# Patient Record
Sex: Female | Born: 1965 | Race: White | Hispanic: No | Marital: Married | Smoking: Former smoker
Health system: Southern US, Community
[De-identification: ages and names within clinical notes are randomized; demographics above are authoritative.]

## PROBLEM LIST (undated history)

## (undated) DIAGNOSIS — J189 Pneumonia, unspecified organism: Secondary | ICD-10-CM

## (undated) DIAGNOSIS — A630 Anogenital (venereal) warts: Secondary | ICD-10-CM

## (undated) DIAGNOSIS — R002 Palpitations: Secondary | ICD-10-CM

## (undated) DIAGNOSIS — G4700 Insomnia, unspecified: Secondary | ICD-10-CM

## (undated) DIAGNOSIS — F419 Anxiety disorder, unspecified: Secondary | ICD-10-CM

## (undated) DIAGNOSIS — IMO0001 Reserved for inherently not codable concepts without codable children: Secondary | ICD-10-CM

## (undated) DIAGNOSIS — R9439 Abnormal result of other cardiovascular function study: Secondary | ICD-10-CM

## (undated) DIAGNOSIS — I2510 Atherosclerotic heart disease of native coronary artery without angina pectoris: Secondary | ICD-10-CM

## (undated) DIAGNOSIS — I10 Essential (primary) hypertension: Secondary | ICD-10-CM

## (undated) DIAGNOSIS — I639 Cerebral infarction, unspecified: Secondary | ICD-10-CM

## (undated) DIAGNOSIS — F319 Bipolar disorder, unspecified: Secondary | ICD-10-CM

## (undated) DIAGNOSIS — J449 Chronic obstructive pulmonary disease, unspecified: Secondary | ICD-10-CM

## (undated) DIAGNOSIS — R059 Cough, unspecified: Secondary | ICD-10-CM

## (undated) DIAGNOSIS — K635 Polyp of colon: Secondary | ICD-10-CM

## (undated) DIAGNOSIS — G5600 Carpal tunnel syndrome, unspecified upper limb: Secondary | ICD-10-CM

## (undated) DIAGNOSIS — R05 Cough: Secondary | ICD-10-CM

## (undated) DIAGNOSIS — IMO0002 Reserved for concepts with insufficient information to code with codable children: Secondary | ICD-10-CM

## (undated) HISTORY — DX: Polyp of colon: K63.5

## (undated) HISTORY — DX: Essential (primary) hypertension: I10

## (undated) HISTORY — DX: Cough: R05

## (undated) HISTORY — DX: Reserved for inherently not codable concepts without codable children: IMO0001

## (undated) HISTORY — DX: Bipolar disorder, unspecified: F31.9

## (undated) HISTORY — DX: Insomnia, unspecified: G47.00

## (undated) HISTORY — DX: Anxiety disorder, unspecified: F41.9

## (undated) HISTORY — DX: Cough, unspecified: R05.9

## (undated) HISTORY — DX: Reserved for concepts with insufficient information to code with codable children: IMO0002

## (undated) HISTORY — DX: Pneumonia, unspecified organism: J18.9

## (undated) HISTORY — DX: Anogenital (venereal) warts: A63.0

## (undated) HISTORY — DX: Carpal tunnel syndrome, unspecified upper limb: G56.00

## (undated) HISTORY — PX: WISDOM TOOTH EXTRACTION: SHX21

## (undated) HISTORY — PX: CHOLECYSTECTOMY: SHX55

## (undated) HISTORY — DX: Cerebral infarction, unspecified: I63.9

## (undated) HISTORY — DX: Abnormal result of other cardiovascular function study: R94.39

## (undated) HISTORY — PX: GANGLION CYST EXCISION: SHX1691

## (undated) HISTORY — PX: OTHER SURGICAL HISTORY: SHX169

## (undated) HISTORY — DX: Palpitations: R00.2

---

## 2003-12-09 DIAGNOSIS — I10 Essential (primary) hypertension: Secondary | ICD-10-CM | POA: Insufficient documentation

## 2005-02-07 ENCOUNTER — Emergency Department: Payer: Self-pay | Admitting: Internal Medicine

## 2005-05-04 ENCOUNTER — Emergency Department: Payer: Self-pay | Admitting: Emergency Medicine

## 2005-09-12 ENCOUNTER — Ambulatory Visit: Payer: Self-pay

## 2006-02-13 ENCOUNTER — Emergency Department: Payer: Self-pay | Admitting: Emergency Medicine

## 2006-11-18 ENCOUNTER — Other Ambulatory Visit: Payer: Self-pay

## 2006-11-18 ENCOUNTER — Emergency Department: Payer: Self-pay | Admitting: Emergency Medicine

## 2007-08-03 ENCOUNTER — Ambulatory Visit: Payer: Self-pay

## 2007-08-07 ENCOUNTER — Ambulatory Visit: Payer: Self-pay

## 2007-08-12 ENCOUNTER — Ambulatory Visit: Payer: Self-pay | Admitting: Gastroenterology

## 2010-09-28 ENCOUNTER — Encounter: Payer: Self-pay | Admitting: Cardiovascular Disease

## 2010-10-03 ENCOUNTER — Other Ambulatory Visit: Payer: Self-pay | Admitting: Family

## 2010-10-15 ENCOUNTER — Encounter: Payer: Self-pay | Admitting: Cardiovascular Disease

## 2010-10-18 ENCOUNTER — Encounter: Payer: Self-pay | Admitting: Cardiovascular Disease

## 2010-10-18 ENCOUNTER — Ambulatory Visit: Payer: Self-pay | Admitting: Internal Medicine

## 2012-01-03 ENCOUNTER — Encounter: Payer: Self-pay | Admitting: Cardiovascular Disease

## 2012-01-14 ENCOUNTER — Encounter: Payer: Self-pay | Admitting: *Deleted

## 2012-01-14 ENCOUNTER — Encounter: Payer: Self-pay | Admitting: Cardiovascular Disease

## 2012-01-14 ENCOUNTER — Ambulatory Visit (INDEPENDENT_AMBULATORY_CARE_PROVIDER_SITE_OTHER): Payer: Medicare HMO | Admitting: Cardiovascular Disease

## 2012-01-14 VITALS — BP 150/90 | HR 72 | Ht 61.0 in | Wt 184.8 lb

## 2012-01-14 DIAGNOSIS — Z720 Tobacco use: Secondary | ICD-10-CM | POA: Insufficient documentation

## 2012-01-14 DIAGNOSIS — I10 Essential (primary) hypertension: Secondary | ICD-10-CM | POA: Insufficient documentation

## 2012-01-14 DIAGNOSIS — I498 Other specified cardiac arrhythmias: Secondary | ICD-10-CM | POA: Insufficient documentation

## 2012-01-14 DIAGNOSIS — R9431 Abnormal electrocardiogram [ECG] [EKG]: Secondary | ICD-10-CM | POA: Insufficient documentation

## 2012-01-14 DIAGNOSIS — F17200 Nicotine dependence, unspecified, uncomplicated: Secondary | ICD-10-CM

## 2012-01-14 MED ORDER — METOPROLOL TARTRATE 25 MG PO TABS: 25.0000 mg | ORAL_TABLET | Freq: Two times a day (BID) | ORAL | Status: DC

## 2012-01-14 NOTE — Assessment & Plan Note (Signed)
Suboptimal control. Increase metoprolol 25 mg twice daily.

## 2012-01-14 NOTE — Patient Instructions (Signed)
Your physician has recommended you make the following change in your medication: INCREASE Metoprolol Tartrate to 25mg  take one by mouth twice a day  Your physician recommends that you have lab work with your PCP: TSH, BMP and Magnesium (order given to patient)  Your physician wants you to follow-up in: 6 MONTHS.  You will receive a reminder letter in the mail two months in advance. If you don't receive a letter, please call our office to schedule the follow-up appointment.

## 2012-01-14 NOTE — Progress Notes (Signed)
HPI:  46 year old woman referred for further evaluation of palpitations and abnormal EKG. She underwent an EKG last week as part of her physical exam and this demonstrated ventricular bigeminy and marked ST/T-wave abnormality. She has had previous evaluation by Dr. Gwen Pounds that included cardiac catheterization last year. By the patient's report, her cardiac cath was normal. I do not have any specific records from the catheterization were her cardiology office visits.  The patient complains of palpitations and localized chest/epigastric discomfort. There is no exertional component present. She denies edema, orthopnea, or PND. She does admit to chronic exertional dyspnea. She continues to smoke up to 2 packs of cigarettes daily.  The patient feels her palpitations and the backs of her eyes. She does not have pulsatile tinnitus.  She isn't physically active, but she walks her dog a little bit and has no symptoms of that level of exertion.  Outpatient Encounter Prescriptions as of 01/14/2012  Medication Sig Dispense Refill  . ALPRAZolam (XANAX) 1 MG tablet Take 1 mg by mouth as needed.      [name] amphetamine-dextroamphetamine (ADDERALL XR) 25 MG 24 hr capsule Take 25 mg by mouth daily. 3 tablets in the am      . aspirin-sod bicarb-citric acid (ALKA-SELTZER) 325 MG TBEF Take 325 mg by mouth every 6 (six) hours as needed.      . calcium carbonate (TUMS - DOSED IN MG ELEMENTAL CALCIUM) 500 MG chewable tablet Chew 1 tablet by mouth daily.      . CHLORPHENIRAMINE-HYDROCODONE PO Take by mouth as needed.      . cyclobenzaprine (FLEXERIL) 5 MG tablet Take 5 mg by mouth as needed.      . DULoxetine (CYMBALTA) 60 MG capsule Take 60 mg by mouth daily.      . fexofenadine (ALLEGRA) 180 MG tablet Take 180 mg by mouth daily.      . fluticasone (FLONASE) 50 MCG/ACT nasal spray Place 2 sprays into the nose daily.      . Fluticasone-Salmeterol (ADVAIR) 250-50 MCG/DOSE AEPB Inhale 1 puff into the lungs every 12 (twelve)  hours.      [name] HYDROcodone-homatropine (HYCODAN) 5-1.5 MG/5ML syrup Take 5 mLs by mouth as needed.      [name] ibuprofen (ADVIL,MOTRIN) 200 MG tablet Take 200 mg by mouth daily.      . medroxyPROGESTERone (DEPO-PROVERA) 150 MG/ML injection Inject 150 mg into the muscle every 3 (three) months.      . Menthol-Methyl Salicylate (MUSCLE RUB) 10-15 % CREA Apply 1 application topically as needed.      . metoprolol tartrate (LOPRESSOR) 25 MG tablet Take 1 tablet (25 mg total) by mouth 2 (two) times daily.  60 tablet  11  . omeprazole (PRILOSEC) 20 MG capsule Take 20 mg by mouth daily.      . traMADol (ULTRAM) 50 MG tablet Take 50 mg by mouth as needed.      . zaleplon (SONATA) 10 MG capsule Take 10 mg by mouth as needed.      [name] DISCONTD: metoprolol tartrate (LOPRESSOR) 25 MG tablet Take 25 mg by mouth 2 (two) times daily. 12.5 twice daily        Effexor and Isosorbide  Past Medical History  Diagnosis Date  . Chest pain   . Hyperplastic colon polyp   . Allergic rhinitis   . Palpitations   . HPV (human papilloma virus) anogenital infection   . Contraception   . Cough   . Systolic dysfunction   . Abnormal stress  test   . Carpal tunnel syndrome   . Chronic anxiety   . Insomnia   . Pneumonia   . Bipolar disorder   . HTN (hypertension)     No past surgical history on file.  History   Social History  . Marital Status: Unknown    Spouse Name: N/A    Number of Children: N/A  . Years of Education: N/A   Occupational History  . Not on file.   Social History Main Topics  . Smoking status: Current Everyday Smoker -- 1.0 packs/day for 26 years    Types: Cigarettes  . Smokeless tobacco: Not on file  . Alcohol Use: No     Had a history of alcohol use but currently is not drinking any  . Drug Use: No  . Sexually Active: Not on file   Other Topics Concern  . Not on file   Social History Narrative  . No narrative on file    Family history: The patient's father had four-vessel bypass in  his 4s, her mother had a postoperative MI.  ROS: General: no fevers/chills/night sweats Eyes: no blurry vision, diplopia, or amaurosis ENT: no sore throat or hearing loss Resp: Positive for cough, wheezing, and shortness of breath. Negative for hemoptysis CV: no edema. Otherwise see history of present illness GI: no abdominal pain, nausea, vomiting, diarrhea, or constipation. Positive for acid reflux symptoms. GU: no dysuria, frequency, or hematuria Skin: no rash Neuro: no headache, numbness, tingling, or weakness of extremities Musculoskeletal: Positive for bilateral forearm and hand pain related to carpal tunnel syndrome Heme: no bleeding, DVT, or easy bruising Endo: no polydipsia or polyuria  BP 150/90  Pulse 72  Ht 5\' 1"  (1.549 m)  Wt 83.825 kg (184 lb 12.8 oz)  BMI 34.92 kg/m2  PHYSICAL EXAM: Pt is alert and oriented, pleasant overweight woman in no distress. HEENT: normal Neck: JVP normal. Carotid upstrokes normal without bruits. No thyromegaly. Lungs: equal expansion, clear bilaterally CV: Apex is discrete and nondisplaced, RRR without murmur or gallop Abd: soft, NT, +BS, no bruit, no hepatosplenomegaly Back: no CVA tenderness Ext: no C/C/E        Femoral pulses 2+= without bruits        DP/PT pulses intact and = Skin: warm and dry without rash Neuro: CNII-XII intact             Strength intact = bilaterally  EKG:  Sinus rhythm with frequent PVCs in a pattern of ventricular bigeminy. Marked ST and T wave abnormality consider inferolateral ischemia.  ASSESSMENT AND PLAN:

## 2012-01-14 NOTE — Assessment & Plan Note (Signed)
Tobacco cessation counseling was done. She does not seem inclined to quit.

## 2012-01-14 NOTE — Assessment & Plan Note (Signed)
The patient's 12-lead EKG shows bigeminy. There is no clear history of structural heart disease. We need to review the records from [place]. It appears she has had a cardiac catheterization I suspect she's also had an echocardiogram. We will request the records from Dr. Philemon Kingdom office and from the cardiac cath lab at [facility]. I'm also going to request that her electrolytes be drawn with her upcoming lab work at her primary care physician's office. This should include magnesium and potassium. I will plan on seeing her back in followup in 6 months. I've asked her to increase her metoprolol to 25 mg twice daily.

## 2012-01-14 NOTE — Assessment & Plan Note (Signed)
The patient's EKG is markedly abnormal. Again, will review records before ordering any further studies. She will need an echocardiogram if this has not been done.

## 2012-04-28 DIAGNOSIS — R87619 Unspecified abnormal cytological findings in specimens from cervix uteri: Secondary | ICD-10-CM

## 2012-04-28 DIAGNOSIS — IMO0002 Reserved for concepts with insufficient information to code with codable children: Secondary | ICD-10-CM

## 2012-04-28 HISTORY — DX: Unspecified abnormal cytological findings in specimens from cervix uteri: R87.619

## 2012-04-28 HISTORY — DX: Reserved for concepts with insufficient information to code with codable children: IMO0002

## 2012-05-06 ENCOUNTER — Ambulatory Visit: Payer: Self-pay | Admitting: Family Medicine

## 2012-05-06 LAB — CBC WITH DIFFERENTIAL/PLATELET
Basophil #: 0.2 10*3/uL — ABNORMAL HIGH (ref 0.0–0.1)
Eosinophil #: 0.1 10*3/uL (ref 0.0–0.7)
HGB: 14.5 g/dL (ref 12.0–16.0)
Lymphocyte #: 1.9 10*3/uL (ref 1.0–3.6)
MCH: 30.8 pg (ref 26.0–34.0)
MCHC: 34.8 g/dL (ref 32.0–36.0)
MCV: 89 fL (ref 80–100)
Monocyte #: 0.7 x10 3/mm (ref 0.2–0.9)
Neutrophil #: 13.1 10*3/uL — ABNORMAL HIGH (ref 1.4–6.5)
Platelet: 171 10*3/uL (ref 150–440)
RBC: 4.7 10*6/uL (ref 3.80–5.20)

## 2012-05-06 LAB — COMPREHENSIVE METABOLIC PANEL
Albumin: 3.7 g/dL (ref 3.4–5.0)
Anion Gap: 9 (ref 7–16)
BUN: 16 mg/dL (ref 7–18)
Glucose: 105 mg/dL — ABNORMAL HIGH (ref 65–99)
Osmolality: 285 (ref 275–301)
Potassium: 3.6 mmol/L (ref 3.5–5.1)
Sodium: 142 mmol/L (ref 136–145)
Total Protein: 7.5 g/dL (ref 6.4–8.2)

## 2012-05-06 LAB — LIPID PANEL
Cholesterol: 179 mg/dL (ref 0–200)
HDL Cholesterol: 41 mg/dL (ref 40–60)
VLDL Cholesterol, Calc: 16 mg/dL (ref 5–40)

## 2012-05-06 LAB — TSH: Thyroid Stimulating Horm: 2.27 u[IU]/mL

## 2012-05-21 ENCOUNTER — Ambulatory Visit (INDEPENDENT_AMBULATORY_CARE_PROVIDER_SITE_OTHER): Payer: Medicare HMO | Admitting: Obstetrics and Gynecology

## 2012-05-21 ENCOUNTER — Encounter: Payer: Self-pay | Admitting: Obstetrics and Gynecology

## 2012-05-21 VITALS — BP 118/74 | Wt 180.0 lb

## 2012-05-21 DIAGNOSIS — B977 Papillomavirus as the cause of diseases classified elsewhere: Secondary | ICD-10-CM

## 2012-05-21 MED ORDER — FOLIC ACID 1 MG PO TABS: 1.0000 mg | ORAL_TABLET | Freq: Every day | ORAL | Status: AC

## 2012-05-21 NOTE — Progress Notes (Signed)
Subjective:  Discuss options   Susan Gilmore is a 46 y.o. female, G1P1001, who was referred by [facility]  Liane Comber FNP) because of pap done 04/29/2011 with + HPV  But no abnormal cells. Patient reports that had a previous abnormal Pap approximately 2 years ago for which she underwent a colposcopy. No biopsies were collected. Since then, has never had a normal Pap smear and admits to not keeping all of her follow-ups.   The following portions of the patient's history were reviewed and updated as appropriate: allergies, current medications, past family history.  Review of Systems Pertinent items are noted in HPI.    Objective:    BP 118/74  Wt 180 lb (81.647 kg)    Weight:  Wt Readings from Last 1 Encounters:  05/21/12 180 lb (81.647 kg)          BMI: There is no height on file to calculate BMI.  General Appearance: Alert, appropriate appearance for age. No acute distress    Assessment:    HPV + on a normal Pap smear    Plan:    40 minutes conversation with patient and her husband on:  1. HPV and its natural course. STD implications. 90% probability of spontaneous resolution with long term immunity to this particular subtype. Dysplasia risk associated with HPV 2. Most likely with previous history, this is a resolution of previous abnormal Pap smear 3. Recommend Pap every 6 months until 3 consecutive normal then may resume annually. Patient will return to her provider for these. 4. Prescribed Folic Acid 1 mg daily to shorten interval for resolution.  Will remain available PRN Thank you for your referral  Silverio Lay MD

## 2012-05-28 ENCOUNTER — Encounter: Payer: Self-pay | Admitting: Obstetrics and Gynecology

## 2012-12-30 DIAGNOSIS — R6889 Other general symptoms and signs: Secondary | ICD-10-CM | POA: Diagnosis not present

## 2012-12-30 DIAGNOSIS — Z3009 Encounter for other general counseling and advice on contraception: Secondary | ICD-10-CM | POA: Diagnosis not present

## 2012-12-30 DIAGNOSIS — I10 Essential (primary) hypertension: Secondary | ICD-10-CM | POA: Diagnosis not present

## 2012-12-30 DIAGNOSIS — R002 Palpitations: Secondary | ICD-10-CM | POA: Diagnosis not present

## 2013-01-01 DIAGNOSIS — R0602 Shortness of breath: Secondary | ICD-10-CM | POA: Diagnosis not present

## 2013-01-01 DIAGNOSIS — R002 Palpitations: Secondary | ICD-10-CM | POA: Diagnosis not present

## 2013-01-01 DIAGNOSIS — I4949 Other premature depolarization: Secondary | ICD-10-CM | POA: Diagnosis not present

## 2013-01-01 DIAGNOSIS — Z3009 Encounter for other general counseling and advice on contraception: Secondary | ICD-10-CM | POA: Diagnosis not present

## 2013-02-25 ENCOUNTER — Ambulatory Visit: Payer: Self-pay | Admitting: Family Medicine

## 2013-03-15 DIAGNOSIS — G5600 Carpal tunnel syndrome, unspecified upper limb: Secondary | ICD-10-CM | POA: Diagnosis not present

## 2013-03-15 DIAGNOSIS — J40 Bronchitis, not specified as acute or chronic: Secondary | ICD-10-CM | POA: Diagnosis not present

## 2013-03-15 DIAGNOSIS — J309 Allergic rhinitis, unspecified: Secondary | ICD-10-CM | POA: Diagnosis not present

## 2013-03-15 DIAGNOSIS — M7989 Other specified soft tissue disorders: Secondary | ICD-10-CM | POA: Diagnosis not present

## 2013-03-19 DIAGNOSIS — Z309 Encounter for contraceptive management, unspecified: Secondary | ICD-10-CM | POA: Diagnosis not present

## 2013-03-19 DIAGNOSIS — J309 Allergic rhinitis, unspecified: Secondary | ICD-10-CM | POA: Diagnosis not present

## 2013-03-19 DIAGNOSIS — J40 Bronchitis, not specified as acute or chronic: Secondary | ICD-10-CM | POA: Diagnosis not present

## 2013-03-19 DIAGNOSIS — M7989 Other specified soft tissue disorders: Secondary | ICD-10-CM | POA: Diagnosis not present

## 2013-04-21 DIAGNOSIS — G5600 Carpal tunnel syndrome, unspecified upper limb: Secondary | ICD-10-CM | POA: Diagnosis not present

## 2013-04-21 DIAGNOSIS — R51 Headache: Secondary | ICD-10-CM | POA: Diagnosis not present

## 2013-04-21 DIAGNOSIS — M7989 Other specified soft tissue disorders: Secondary | ICD-10-CM | POA: Diagnosis not present

## 2013-04-21 DIAGNOSIS — I10 Essential (primary) hypertension: Secondary | ICD-10-CM | POA: Diagnosis not present

## 2013-05-20 DIAGNOSIS — G5600 Carpal tunnel syndrome, unspecified upper limb: Secondary | ICD-10-CM | POA: Diagnosis not present

## 2013-05-20 DIAGNOSIS — R51 Headache: Secondary | ICD-10-CM | POA: Diagnosis not present

## 2013-05-20 DIAGNOSIS — M7989 Other specified soft tissue disorders: Secondary | ICD-10-CM | POA: Diagnosis not present

## 2013-05-20 DIAGNOSIS — R05 Cough: Secondary | ICD-10-CM | POA: Diagnosis not present

## 2013-06-11 DIAGNOSIS — G5600 Carpal tunnel syndrome, unspecified upper limb: Secondary | ICD-10-CM | POA: Diagnosis not present

## 2013-06-11 DIAGNOSIS — R05 Cough: Secondary | ICD-10-CM | POA: Diagnosis not present

## 2013-06-11 DIAGNOSIS — R51 Headache: Secondary | ICD-10-CM | POA: Diagnosis not present

## 2013-06-11 DIAGNOSIS — M7989 Other specified soft tissue disorders: Secondary | ICD-10-CM | POA: Diagnosis not present

## 2013-06-11 DIAGNOSIS — Z309 Encounter for contraceptive management, unspecified: Secondary | ICD-10-CM | POA: Diagnosis not present

## 2013-06-11 DIAGNOSIS — Z23 Encounter for immunization: Secondary | ICD-10-CM | POA: Diagnosis not present

## 2013-06-15 DIAGNOSIS — Z309 Encounter for contraceptive management, unspecified: Secondary | ICD-10-CM | POA: Diagnosis not present

## 2013-06-15 DIAGNOSIS — R51 Headache: Secondary | ICD-10-CM | POA: Diagnosis not present

## 2013-06-15 DIAGNOSIS — F988 Other specified behavioral and emotional disorders with onset usually occurring in childhood and adolescence: Secondary | ICD-10-CM | POA: Diagnosis not present

## 2013-06-15 DIAGNOSIS — M7989 Other specified soft tissue disorders: Secondary | ICD-10-CM | POA: Diagnosis not present

## 2013-06-15 DIAGNOSIS — Z23 Encounter for immunization: Secondary | ICD-10-CM | POA: Diagnosis not present

## 2013-06-15 DIAGNOSIS — R05 Cough: Secondary | ICD-10-CM | POA: Diagnosis not present

## 2013-09-09 LAB — HM PAP SMEAR: HM Pap smear: POSITIVE

## 2013-11-03 ENCOUNTER — Other Ambulatory Visit: Payer: Self-pay

## 2013-11-03 LAB — COMPREHENSIVE METABOLIC PANEL
ALBUMIN: 3.8 g/dL (ref 3.4–5.0)
ALK PHOS: 102 U/L
ALT: 36 U/L (ref 12–78)
AST: 22 U/L (ref 15–37)
Anion Gap: 4 — ABNORMAL LOW (ref 7–16)
BUN: 10 mg/dL (ref 7–18)
Bilirubin,Total: 0.6 mg/dL (ref 0.2–1.0)
CO2: 26 mmol/L (ref 21–32)
Calcium, Total: 8.9 mg/dL (ref 8.5–10.1)
Chloride: 108 mmol/L — ABNORMAL HIGH (ref 98–107)
Creatinine: 0.84 mg/dL (ref 0.60–1.30)
EGFR (African American): >60
EGFR (Non-African Amer.): >60
Glucose: 87 mg/dL (ref 65–99)
Osmolality: 274 (ref 275–301)
Potassium: 3.5 mmol/L (ref 3.5–5.1)
Sodium: 138 mmol/L (ref 136–145)
Total Protein: 7.5 g/dL (ref 6.4–8.2)

## 2013-11-03 LAB — CBC WITH DIFFERENTIAL/PLATELET
Basophil #: 0.1 10*3/uL (ref 0.0–0.1)
Basophil %: 1.3 %
Eosinophil #: 0 10*3/uL (ref 0.0–0.7)
Eosinophil %: 0.3 %
HCT: 35.9 % (ref 35.0–47.0)
HGB: 12.9 g/dL (ref 12.0–16.0)
LYMPHS ABS: 1.2 10*3/uL (ref 1.0–3.6)
Lymphocyte %: 18.4 %
MCH: 32.9 pg (ref 26.0–34.0)
MCHC: 35.9 g/dL (ref 32.0–36.0)
MCV: 92 fL (ref 80–100)
MONO ABS: 0.4 x10 3/mm (ref 0.2–0.9)
MONOS PCT: 6.3 %
NEUTROS PCT: 73.7 %
Neutrophil #: 4.8 10*3/uL (ref 1.4–6.5)
Platelet: 150 10*3/uL (ref 150–440)
RBC: 3.91 10*6/uL (ref 3.80–5.20)
RDW: 15.2 % — ABNORMAL HIGH (ref 11.5–14.5)
WBC: 6.5 10*3/uL (ref 3.6–11.0)

## 2013-11-03 LAB — LIPID PANEL
Cholesterol: 157 mg/dL (ref 0–200)
HDL Cholesterol: 33 mg/dL — ABNORMAL LOW (ref 40–60)
LDL CHOLESTEROL, CALC: 98 mg/dL (ref 0–100)
Triglycerides: 128 mg/dL (ref 0–200)
VLDL CHOLESTEROL, CALC: 26 mg/dL (ref 5–40)

## 2013-11-03 LAB — HEMOGLOBIN A1C: Hemoglobin A1C: 5.3 % (ref 4.2–6.3)

## 2013-11-03 LAB — TSH: Thyroid Stimulating Horm: 1.69 u[IU]/mL

## 2013-11-04 DIAGNOSIS — M7989 Other specified soft tissue disorders: Secondary | ICD-10-CM | POA: Diagnosis not present

## 2013-11-04 DIAGNOSIS — Z23 Encounter for immunization: Secondary | ICD-10-CM | POA: Diagnosis not present

## 2013-11-04 DIAGNOSIS — Z0000 Encounter for general adult medical examination without abnormal findings: Secondary | ICD-10-CM | POA: Diagnosis not present

## 2013-11-04 DIAGNOSIS — Z309 Encounter for contraceptive management, unspecified: Secondary | ICD-10-CM | POA: Diagnosis not present

## 2013-11-04 DIAGNOSIS — I10 Essential (primary) hypertension: Secondary | ICD-10-CM | POA: Diagnosis not present

## 2013-11-04 DIAGNOSIS — F988 Other specified behavioral and emotional disorders with onset usually occurring in childhood and adolescence: Secondary | ICD-10-CM | POA: Diagnosis not present

## 2013-12-07 DIAGNOSIS — M7989 Other specified soft tissue disorders: Secondary | ICD-10-CM | POA: Diagnosis not present

## 2013-12-07 DIAGNOSIS — F988 Other specified behavioral and emotional disorders with onset usually occurring in childhood and adolescence: Secondary | ICD-10-CM | POA: Diagnosis not present

## 2013-12-07 DIAGNOSIS — Z309 Encounter for contraceptive management, unspecified: Secondary | ICD-10-CM | POA: Diagnosis not present

## 2013-12-07 DIAGNOSIS — Z0000 Encounter for general adult medical examination without abnormal findings: Secondary | ICD-10-CM | POA: Diagnosis not present

## 2013-12-07 DIAGNOSIS — I10 Essential (primary) hypertension: Secondary | ICD-10-CM | POA: Diagnosis not present

## 2013-12-07 DIAGNOSIS — Z23 Encounter for immunization: Secondary | ICD-10-CM | POA: Diagnosis not present

## 2013-12-20 ENCOUNTER — Ambulatory Visit: Payer: Self-pay | Admitting: Family Medicine

## 2013-12-24 ENCOUNTER — Ambulatory Visit: Payer: Self-pay | Admitting: Family Medicine

## 2014-01-05 ENCOUNTER — Other Ambulatory Visit: Payer: Self-pay | Admitting: Family Medicine

## 2014-01-05 LAB — SEDIMENTATION RATE: Erythrocyte Sed Rate: 33 mm/hr — ABNORMAL HIGH (ref 0–20)

## 2014-01-05 LAB — COMPREHENSIVE METABOLIC PANEL
ALK PHOS: 112 U/L
ALT: 56 U/L (ref 12–78)
Albumin: 3.4 g/dL (ref 3.4–5.0)
Anion Gap: 4 — ABNORMAL LOW (ref 7–16)
BILIRUBIN TOTAL: 0.3 mg/dL (ref 0.2–1.0)
BUN: 20 mg/dL — ABNORMAL HIGH (ref 7–18)
CALCIUM: 9.2 mg/dL (ref 8.5–10.1)
Chloride: 103 mmol/L (ref 98–107)
Co2: 31 mmol/L (ref 21–32)
Creatinine: 1.13 mg/dL (ref 0.60–1.30)
EGFR (Non-African Amer.): 58 — ABNORMAL LOW
GLUCOSE: 146 mg/dL — AB (ref 65–99)
Osmolality: 281 (ref 275–301)
POTASSIUM: 3.7 mmol/L (ref 3.5–5.1)
SGOT(AST): 23 U/L (ref 15–37)
Sodium: 138 mmol/L (ref 136–145)
TOTAL PROTEIN: 7.5 g/dL (ref 6.4–8.2)

## 2014-01-05 LAB — CK: CK, Total: 29 U/L

## 2014-02-04 ENCOUNTER — Other Ambulatory Visit: Payer: Self-pay | Admitting: Family Medicine

## 2014-02-04 LAB — CBC WITH DIFFERENTIAL/PLATELET
BASOS ABS: 0.1 10*3/uL (ref 0.0–0.1)
Basophil %: 0.6 %
EOS PCT: 0.5 %
Eosinophil #: 0.1 10*3/uL (ref 0.0–0.7)
HCT: 37.9 % (ref 35.0–47.0)
HGB: 13 g/dL (ref 12.0–16.0)
LYMPHS ABS: 1.8 10*3/uL (ref 1.0–3.6)
Lymphocyte %: 14.7 %
MCH: 31.1 pg (ref 26.0–34.0)
MCHC: 34.3 g/dL (ref 32.0–36.0)
MCV: 91 fL (ref 80–100)
MONO ABS: 0.8 x10 3/mm (ref 0.2–0.9)
Monocyte %: 7.1 %
Neutrophil #: 9.2 10*3/uL — ABNORMAL HIGH (ref 1.4–6.5)
Neutrophil %: 77.1 %
Platelet: 254 10*3/uL (ref 150–440)
RBC: 4.18 10*6/uL (ref 3.80–5.20)
RDW: 17.3 % — AB (ref 11.5–14.5)
WBC: 11.9 10*3/uL — ABNORMAL HIGH (ref 3.6–11.0)

## 2014-02-04 LAB — CBC AND DIFFERENTIAL
HCT: 38 % (ref 36–46)
Hemoglobin: 13 g/dL (ref 12.0–16.0)
Neutrophils Absolute: 77 /uL
Platelets: 254 10*3/uL (ref 150–399)
WBC: 11.9 10^3/mL

## 2014-02-04 LAB — SEDIMENTATION RATE: Erythrocyte Sed Rate: 49 mm/hr — ABNORMAL HIGH (ref 0–20)

## 2014-02-04 LAB — POCT ERYTHROCYTE SEDIMENTATION RATE, NON-AUTOMATED: Sed Rate: 49 mm

## 2014-02-07 ENCOUNTER — Ambulatory Visit: Payer: Self-pay | Admitting: Family Medicine

## 2014-02-17 DIAGNOSIS — I7300 Raynaud's syndrome without gangrene: Secondary | ICD-10-CM | POA: Insufficient documentation

## 2014-02-24 ENCOUNTER — Ambulatory Visit: Payer: Self-pay | Admitting: Internal Medicine

## 2014-02-24 LAB — CBC CANCER CENTER
EOS PCT: 1 %
HCT: 39 % (ref 35.0–47.0)
HGB: 13.3 g/dL (ref 12.0–16.0)
LYMPHS PCT: 9 %
MCH: 30.2 pg (ref 26.0–34.0)
MCHC: 34 g/dL (ref 32.0–36.0)
MCV: 89 fL (ref 80–100)
Monocytes: 6 %
Platelet: 249 x10 3/mm (ref 150–440)
RBC: 4.4 10*6/uL (ref 3.80–5.20)
RDW: 16.4 % — ABNORMAL HIGH (ref 11.5–14.5)
Segmented Neutrophils: 83 %
Variant Lymphocyte: 1 %
WBC: 12.6 x10 3/mm — ABNORMAL HIGH (ref 3.6–11.0)

## 2014-02-24 LAB — LACTATE DEHYDROGENASE: LDH: 230 U/L (ref 81–246)

## 2014-02-28 LAB — PROT IMMUNOELECTROPHORES(ARMC)

## 2014-03-07 DIAGNOSIS — R894 Abnormal immunological findings in specimens from other organs, systems and tissues: Secondary | ICD-10-CM | POA: Diagnosis not present

## 2014-03-07 DIAGNOSIS — Z23 Encounter for immunization: Secondary | ICD-10-CM | POA: Diagnosis not present

## 2014-03-07 DIAGNOSIS — Z309 Encounter for contraceptive management, unspecified: Secondary | ICD-10-CM | POA: Diagnosis not present

## 2014-03-07 DIAGNOSIS — R05 Cough: Secondary | ICD-10-CM | POA: Diagnosis not present

## 2014-03-07 DIAGNOSIS — R059 Cough, unspecified: Secondary | ICD-10-CM | POA: Diagnosis not present

## 2014-03-07 DIAGNOSIS — R599 Enlarged lymph nodes, unspecified: Secondary | ICD-10-CM | POA: Diagnosis not present

## 2014-03-07 DIAGNOSIS — J309 Allergic rhinitis, unspecified: Secondary | ICD-10-CM | POA: Diagnosis not present

## 2014-03-25 ENCOUNTER — Emergency Department: Payer: Self-pay | Admitting: Emergency Medicine

## 2014-03-26 ENCOUNTER — Ambulatory Visit: Payer: Self-pay | Admitting: Internal Medicine

## 2014-03-27 ENCOUNTER — Emergency Department: Payer: Self-pay | Admitting: Emergency Medicine

## 2014-03-30 ENCOUNTER — Emergency Department: Payer: Self-pay | Admitting: Emergency Medicine

## 2014-04-20 ENCOUNTER — Other Ambulatory Visit: Payer: Self-pay

## 2014-04-20 LAB — RENAL FUNCTION PANEL
ALBUMIN: 3.2 g/dL — AB (ref 3.4–5.0)
Anion Gap: 10 (ref 7–16)
BUN: 20 mg/dL — AB (ref 7–18)
Calcium, Total: 8.7 mg/dL (ref 8.5–10.1)
Chloride: 109 mmol/L — ABNORMAL HIGH (ref 98–107)
Co2: 26 mmol/L (ref 21–32)
Creatinine: 1.33 mg/dL — ABNORMAL HIGH (ref 0.60–1.30)
EGFR (Non-African Amer.): 48 — ABNORMAL LOW
GFR CALC AF AMER: 55 — AB
Glucose: 85 mg/dL (ref 65–99)
Osmolality: 291 (ref 275–301)
PHOSPHORUS: 2.8 mg/dL (ref 2.5–4.9)
Potassium: 3.6 mmol/L (ref 3.5–5.1)
Sodium: 145 mmol/L (ref 136–145)

## 2014-05-27 ENCOUNTER — Ambulatory Visit: Payer: Self-pay | Admitting: Internal Medicine

## 2014-05-27 LAB — CBC CANCER CENTER
BASOS PCT: 1.5 %
Basophil #: 0.1 x10 3/mm (ref 0.0–0.1)
Eosinophil #: 0.3 x10 3/mm (ref 0.0–0.7)
Eosinophil %: 3.3 %
HCT: 37.3 % (ref 35.0–47.0)
HGB: 12.2 g/dL (ref 12.0–16.0)
LYMPHS ABS: 1.3 x10 3/mm (ref 1.0–3.6)
Lymphocyte %: 14.4 %
MCH: 32.7 pg (ref 26.0–34.0)
MCHC: 32.5 g/dL (ref 32.0–36.0)
MCV: 101 fL — ABNORMAL HIGH (ref 80–100)
MONOS PCT: 7.1 %
Monocyte #: 0.7 x10 3/mm (ref 0.2–0.9)
Neutrophil #: 6.7 x10 3/mm — ABNORMAL HIGH (ref 1.4–6.5)
Neutrophil %: 73.7 %
Platelet: 238 x10 3/mm (ref 150–440)
RBC: 3.71 10*6/uL — ABNORMAL LOW (ref 3.80–5.20)
RDW: 20.9 % — AB (ref 11.5–14.5)
WBC: 9.1 x10 3/mm (ref 3.6–11.0)

## 2014-05-27 LAB — CREATININE, SERUM
CREATININE: 1.04 mg/dL (ref 0.60–1.30)
EGFR (African American): >60
EGFR (Non-African Amer.): >60

## 2014-05-27 LAB — LACTATE DEHYDROGENASE: LDH: 281 U/L — AB (ref 81–246)

## 2014-05-27 LAB — HCG, QUANTITATIVE, PREGNANCY

## 2014-06-01 DIAGNOSIS — Z23 Encounter for immunization: Secondary | ICD-10-CM | POA: Diagnosis not present

## 2014-06-22 ENCOUNTER — Ambulatory Visit: Payer: Self-pay | Admitting: Family Medicine

## 2014-06-22 LAB — LIPID PANEL
CHOLESTEROL: 166 mg/dL (ref 0–200)
Cholesterol: 166 mg/dL (ref 0–200)
HDL Cholesterol: 39 mg/dL — ABNORMAL LOW (ref 40–60)
HDL: 39 mg/dL (ref 35–70)
LDL Cholesterol: 92 mg/dL
Ldl Cholesterol, Calc: 92 mg/dL (ref 0–100)
TRIGLYCERIDES: 175 mg/dL (ref 0–200)
Triglycerides: 175 mg/dL — AB (ref 40–160)
VLDL Cholesterol, Calc: 35 mg/dL (ref 5–40)

## 2014-06-22 LAB — HEMOGLOBIN A1C: HEMOGLOBIN A1C: 5.1 % (ref 4.0–6.0)

## 2014-06-22 LAB — COMPREHENSIVE METABOLIC PANEL
ALBUMIN: 3.3 g/dL — AB (ref 3.4–5.0)
Alkaline Phosphatase: 86 U/L
Anion Gap: 8 (ref 7–16)
BUN: 17 mg/dL (ref 7–18)
Bilirubin,Total: 0.5 mg/dL (ref 0.2–1.0)
CHLORIDE: 109 mmol/L — AB (ref 98–107)
CREATININE: 0.81 mg/dL (ref 0.60–1.30)
Calcium, Total: 8.1 mg/dL — ABNORMAL LOW (ref 8.5–10.1)
Co2: 26 mmol/L (ref 21–32)
EGFR (African American): >60
EGFR (Non-African Amer.): >60
Glucose: 95 mg/dL (ref 65–99)
Osmolality: 286 (ref 275–301)
Potassium: 3.6 mmol/L (ref 3.5–5.1)
SGOT(AST): 24 U/L (ref 15–37)
SGPT (ALT): 60 U/L
Sodium: 143 mmol/L (ref 136–145)
Total Protein: 6.6 g/dL (ref 6.4–8.2)

## 2014-06-22 LAB — BASIC METABOLIC PANEL
BUN: 17 mg/dL (ref 4–21)
Creatinine: 0.8 mg/dL (ref 0.5–1.1)
Glucose: 95 mg/dL
Potassium: 3.6 mmol/L (ref 3.4–5.3)
Sodium: 143 mmol/L (ref 137–147)

## 2014-06-22 LAB — HEPATIC FUNCTION PANEL
ALT: 60 U/L — AB (ref 7–35)
AST: 24 U/L (ref 13–35)
Alkaline Phosphatase: 86 U/L (ref 25–125)
Bilirubin, Total: 0.5 mg/dL

## 2014-06-26 ENCOUNTER — Ambulatory Visit: Payer: Self-pay | Admitting: Internal Medicine

## 2014-06-27 ENCOUNTER — Encounter: Payer: Self-pay | Admitting: Obstetrics and Gynecology

## 2014-11-24 DIAGNOSIS — Z309 Encounter for contraceptive management, unspecified: Secondary | ICD-10-CM | POA: Diagnosis not present

## 2014-11-24 DIAGNOSIS — R05 Cough: Secondary | ICD-10-CM | POA: Diagnosis not present

## 2014-11-24 DIAGNOSIS — J40 Bronchitis, not specified as acute or chronic: Secondary | ICD-10-CM | POA: Diagnosis not present

## 2014-11-24 DIAGNOSIS — B354 Tinea corporis: Secondary | ICD-10-CM | POA: Diagnosis not present

## 2014-11-28 DIAGNOSIS — M545 Low back pain: Secondary | ICD-10-CM | POA: Diagnosis not present

## 2014-12-26 DIAGNOSIS — M545 Low back pain: Secondary | ICD-10-CM | POA: Diagnosis not present

## 2014-12-29 DIAGNOSIS — G4700 Insomnia, unspecified: Secondary | ICD-10-CM | POA: Insufficient documentation

## 2014-12-29 DIAGNOSIS — M25519 Pain in unspecified shoulder: Secondary | ICD-10-CM | POA: Insufficient documentation

## 2014-12-29 DIAGNOSIS — D126 Benign neoplasm of colon, unspecified: Secondary | ICD-10-CM | POA: Insufficient documentation

## 2014-12-29 DIAGNOSIS — M549 Dorsalgia, unspecified: Secondary | ICD-10-CM | POA: Insufficient documentation

## 2014-12-29 DIAGNOSIS — R519 Headache, unspecified: Secondary | ICD-10-CM | POA: Insufficient documentation

## 2014-12-29 DIAGNOSIS — R079 Chest pain, unspecified: Secondary | ICD-10-CM | POA: Insufficient documentation

## 2014-12-29 DIAGNOSIS — R51 Headache: Secondary | ICD-10-CM

## 2014-12-29 DIAGNOSIS — I519 Heart disease, unspecified: Secondary | ICD-10-CM | POA: Insufficient documentation

## 2014-12-29 DIAGNOSIS — I4892 Unspecified atrial flutter: Secondary | ICD-10-CM

## 2014-12-29 DIAGNOSIS — B354 Tinea corporis: Secondary | ICD-10-CM | POA: Insufficient documentation

## 2014-12-29 DIAGNOSIS — R002 Palpitations: Secondary | ICD-10-CM | POA: Insufficient documentation

## 2014-12-29 DIAGNOSIS — B977 Papillomavirus as the cause of diseases classified elsewhere: Secondary | ICD-10-CM | POA: Insufficient documentation

## 2014-12-29 DIAGNOSIS — G5600 Carpal tunnel syndrome, unspecified upper limb: Secondary | ICD-10-CM | POA: Insufficient documentation

## 2014-12-29 DIAGNOSIS — E559 Vitamin D deficiency, unspecified: Secondary | ICD-10-CM | POA: Insufficient documentation

## 2014-12-29 DIAGNOSIS — L259 Unspecified contact dermatitis, unspecified cause: Secondary | ICD-10-CM | POA: Insufficient documentation

## 2014-12-29 DIAGNOSIS — F319 Bipolar disorder, unspecified: Secondary | ICD-10-CM | POA: Insufficient documentation

## 2014-12-29 DIAGNOSIS — I7090 Unspecified atherosclerosis: Secondary | ICD-10-CM | POA: Insufficient documentation

## 2014-12-29 DIAGNOSIS — R000 Tachycardia, unspecified: Secondary | ICD-10-CM | POA: Insufficient documentation

## 2014-12-29 DIAGNOSIS — F419 Anxiety disorder, unspecified: Secondary | ICD-10-CM | POA: Insufficient documentation

## 2014-12-29 DIAGNOSIS — F988 Other specified behavioral and emotional disorders with onset usually occurring in childhood and adolescence: Secondary | ICD-10-CM | POA: Insufficient documentation

## 2014-12-29 DIAGNOSIS — S2020XA Contusion of thorax, unspecified, initial encounter: Secondary | ICD-10-CM | POA: Insufficient documentation

## 2014-12-29 DIAGNOSIS — R768 Other specified abnormal immunological findings in serum: Secondary | ICD-10-CM | POA: Insufficient documentation

## 2014-12-29 DIAGNOSIS — R739 Hyperglycemia, unspecified: Secondary | ICD-10-CM | POA: Insufficient documentation

## 2014-12-29 DIAGNOSIS — K219 Gastro-esophageal reflux disease without esophagitis: Secondary | ICD-10-CM | POA: Insufficient documentation

## 2014-12-29 DIAGNOSIS — R590 Localized enlarged lymph nodes: Secondary | ICD-10-CM | POA: Insufficient documentation

## 2014-12-29 DIAGNOSIS — IMO0002 Reserved for concepts with insufficient information to code with codable children: Secondary | ICD-10-CM | POA: Insufficient documentation

## 2014-12-29 DIAGNOSIS — I4891 Unspecified atrial fibrillation: Secondary | ICD-10-CM | POA: Insufficient documentation

## 2014-12-31 DIAGNOSIS — M545 Low back pain: Secondary | ICD-10-CM | POA: Diagnosis not present

## 2015-01-05 DIAGNOSIS — M545 Low back pain: Secondary | ICD-10-CM | POA: Diagnosis not present

## 2015-02-07 DIAGNOSIS — M545 Low back pain: Secondary | ICD-10-CM | POA: Diagnosis not present

## 2015-02-12 ENCOUNTER — Encounter: Payer: Self-pay | Admitting: *Deleted

## 2015-02-12 ENCOUNTER — Emergency Department: Payer: Managed Care, Other (non HMO)

## 2015-02-12 ENCOUNTER — Emergency Department
Admission: EM | Admit: 2015-02-12 | Discharge: 2015-02-12 | Disposition: A | Discharge status: Home / Self Care | Payer: Managed Care, Other (non HMO) | Attending: Emergency Medicine | Admitting: Emergency Medicine

## 2015-02-12 DIAGNOSIS — Y9289 Other specified places as the place of occurrence of the external cause: Secondary | ICD-10-CM | POA: Insufficient documentation

## 2015-02-12 DIAGNOSIS — Z7982 Long term (current) use of aspirin: Secondary | ICD-10-CM | POA: Insufficient documentation

## 2015-02-12 DIAGNOSIS — Z87891 Personal history of nicotine dependence: Secondary | ICD-10-CM | POA: Insufficient documentation

## 2015-02-12 DIAGNOSIS — Y998 Other external cause status: Secondary | ICD-10-CM | POA: Diagnosis not present

## 2015-02-12 DIAGNOSIS — M7989 Other specified soft tissue disorders: Secondary | ICD-10-CM | POA: Diagnosis not present

## 2015-02-12 DIAGNOSIS — Y9389 Activity, other specified: Secondary | ICD-10-CM | POA: Diagnosis not present

## 2015-02-12 DIAGNOSIS — Z79899 Other long term (current) drug therapy: Secondary | ICD-10-CM | POA: Diagnosis not present

## 2015-02-12 DIAGNOSIS — S93401A Sprain of unspecified ligament of right ankle, initial encounter: Secondary | ICD-10-CM

## 2015-02-12 DIAGNOSIS — S99911A Unspecified injury of right ankle, initial encounter: Secondary | ICD-10-CM | POA: Diagnosis present

## 2015-02-12 DIAGNOSIS — X58XXXA Exposure to other specified factors, initial encounter: Secondary | ICD-10-CM | POA: Diagnosis not present

## 2015-02-12 DIAGNOSIS — I10 Essential (primary) hypertension: Secondary | ICD-10-CM | POA: Diagnosis not present

## 2015-02-12 DIAGNOSIS — M19012 Primary osteoarthritis, left shoulder: Secondary | ICD-10-CM | POA: Diagnosis not present

## 2015-02-12 DIAGNOSIS — Z7951 Long term (current) use of inhaled steroids: Secondary | ICD-10-CM | POA: Diagnosis not present

## 2015-02-12 LAB — URIC ACID: URIC ACID, SERUM: 4.7 mg/dL (ref 2.3–6.6)

## 2015-02-12 MED ORDER — KETOROLAC TROMETHAMINE 10 MG PO TABS
20.0000 mg | ORAL_TABLET | Freq: Once | ORAL | Status: AC
  Administered 2015-02-12: 20 mg via ORAL

## 2015-02-12 MED ORDER — KETOROLAC TROMETHAMINE 10 MG PO TABS: 10.0000 mg | ORAL_TABLET | Freq: Three times a day (TID) | ORAL | Status: DC

## 2015-02-12 MED ORDER — KETOROLAC TROMETHAMINE 10 MG PO TABS
ORAL_TABLET | ORAL | Status: AC
  Filled 2015-02-12: qty 1

## 2015-02-12 MED ORDER — CYCLOBENZAPRINE HCL 5 MG PO TABS: 5.0000 mg | ORAL_TABLET | Freq: Three times a day (TID) | ORAL | Status: DC | PRN

## 2015-02-12 NOTE — ED Provider Notes (Signed)
[facility] Emergency Department Provider Note ____________________________________________  Time seen: 2010  I have reviewed the triage vital signs and the nursing notes.  HISTORY  Chief Complaint  Ankle Pain  HPI Susan Gilmore is a 49 y.o. female who reports to the ED for evaluation and management of pain to her left shoulder and right ankle. She describes pain and swelling to the right ankle for the last 2-3 days. She describes lateral swelling and redness to the ankle. She has increased pain with ambulation and the joint is tender to touch. She denies any injury, trauma, sprain or fall. She does have a history of rheumatoid arthritis. She also notes some decreased shoulder range of motion with increased pain she is also without fallen outstretched hand, other shoulder injury. She denies any distal upper extremity paresthesias. She is not aware of a history of gout in her past.  Past Medical History  Diagnosis Date  . Chest pain   . Hyperplastic colon polyp   . Allergic rhinitis   . Palpitations   . HPV (human papilloma virus) anogenital infection   . Contraception   . Cough   . Abnormal stress test   . Carpal tunnel syndrome   . Chronic anxiety   . Insomnia   . Pneumonia   . Bipolar disorder   . HTN (hypertension)   . Abnormal Pap smear 04/28/2012    normal pap /positive hpv     Patient Active Problem List   Diagnosis Date Noted  . ADD (attention deficit disorder) 12/29/2014  . 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 Back ache 12/29/2014  . Bipolar disorder 12/29/2014  . Carpal tunnel syndrome 12/29/2014  . Chest pain 12/29/2014  . Chronic anxiety 12/29/2014  . CD (contact dermatitis) 12/29/2014  . Elevated blood sugar 12/29/2014  . Elevated rheumatoid factor 12/29/2014  . Acid reflux 12/29/2014  . Infectious human wart virus 12/29/2014  . Benign neoplasm of colon 12/29/2014  . Cephalalgia 12/29/2014  . Cannot sleep 12/29/2014  . Awareness  of heartbeats 12/29/2014  . Bruise, trunk 12/29/2014  . Flutter-fibrillation 12/29/2014  . LAD (lymphadenopathy), retroperitoneal 12/29/2014  . Body tinea 12/29/2014  . Fast heart beat 12/29/2014  . Systolic dysfunction 77/41/2878  . Pain in shoulder 12/29/2014  . Avitaminosis D 12/29/2014  . Paroxysmal digital cyanosis 02/17/2014  . Ventricular bigeminy 01/14/2012  . Hypertension 01/14/2012  . Abnormal EKG 01/14/2012  . Tobacco abuse 01/14/2012  . Benign essential HTN 12/09/2003    Past Surgical History  Procedure Laterality Date  . Ganglion cyst excision    . Wisdom tooth extraction    . Full mouth dental      Current Outpatient Rx  Name  Route  Sig  Dispense  Refill  . Adalimumab 40 MG/0.8ML PNKT   Subcutaneous   Inject 40 mg into the skin every 14 (fourteen) days.         [name] albuterol (PROVENTIL HFA;VENTOLIN HFA) 108 (90 BASE) MCG/ACT inhaler   Inhalation   Inhale 2 puffs into the lungs every 6 (six) hours as needed.         . ALPRAZolam (XANAX XR) 1 MG 24 hr tablet   Oral   Take 1 tablet by mouth 4 (four) times daily as needed.         . ALPRAZolam (XANAX) 1 MG tablet   Oral   Take 1 mg by mouth as needed.         [name] amphetamine-dextroamphetamine (ADDERALL XR) 25 MG  24 hr capsule   Oral   Take 25 mg by mouth daily. 3 tablets in the am         . amphetamine-dextroamphetamine (ADDERALL XR) 30 MG 24 hr capsule   Oral   Take 1 capsule by mouth 2 (two) times daily.         [name] aspirin-sod bicarb-citric acid (ALKA-SELTZER) 325 MG TBEF   Oral   Take 325 mg by mouth every 6 (six) hours as needed.         . calcium carbonate (TUMS - DOSED IN MG ELEMENTAL CALCIUM) 500 MG chewable tablet   Oral   Chew 1 tablet by mouth daily.         . CHLORPHENIRAMINE-HYDROCODONE PO   Oral   Take by mouth as needed.         . clotrimazole-betamethasone (LOTRISONE) cream      1 application 2 (two) times daily.         . clotrimazole-betamethasone (LOTRISONE)  cream      1 application daily.         . cyclobenzaprine (FLEXERIL) 5 MG tablet   Oral   Take 5 mg by mouth as needed.         . cyclobenzaprine (FLEXERIL) 5 MG tablet   Oral   Take 1 tablet (5 mg total) by mouth every 8 (eight) hours as needed for muscle spasms.   12 tablet   0   . DULoxetine (CYMBALTA) 30 MG capsule   Oral   Take 1 capsule by mouth 3 (three) times daily.         . DULoxetine (CYMBALTA) 60 MG capsule   Oral   Take 60 mg by mouth daily.         . fexofenadine (ALLEGRA) 180 MG tablet   Oral   Take 180 mg by mouth daily.         . fluticasone (FLONASE) 50 MCG/ACT nasal spray   Nasal   Place 2 sprays into the nose daily.         . fluticasone (FLONASE) 50 MCG/ACT nasal spray   Nasal   Place 2 sprays into the nose daily.         . Fluticasone-Salmeterol (ADVAIR) 250-50 MCG/DOSE AEPB   Inhalation   Inhale 1 puff into the lungs every 12 (twelve) hours.         . Fluticasone-Salmeterol (ADVAIR) 250-50 MCG/DOSE AEPB   Inhalation   Inhale 1 puff into the lungs 2 (two) times daily.         . folic acid (FOLVITE) 1 MG tablet   Oral   Take 1 tablet by mouth 2 (two) times daily.         . hydrochlorothiazide (HYDRODIURIL) 12.5 MG tablet   Oral   Take 1 tablet by mouth daily.         [name] HYDROcodone-homatropine (HYCODAN) 5-1.5 MG/5ML syrup   Oral   Take 5 mLs by mouth as needed.         [name] ibuprofen (ADVIL,MOTRIN) 200 MG tablet   Oral   Take 200 mg by mouth daily.         [name] ketorolac (TORADOL) 10 MG tablet   Oral   Take 1 tablet (10 mg total) by mouth every 8 (eight) hours.   15 tablet   0   . loratadine (CLARITIN) 10 MG tablet   Oral   Take 1 tablet by mouth daily.         [name]  medroxyPROGESTERone (DEPO-PROVERA) 150 MG/ML injection   Intramuscular   Inject 150 mg into the muscle every 3 (three) months.         . medroxyPROGESTERone (DEPO-PROVERA) 150 MG/ML injection   Intramuscular   Inject 150 mg into the muscle  every 3 (three) months.         . Menthol-Methyl Salicylate (MUSCLE RUB) 10-15 % CREA   Topical   Apply 1 application topically as needed.         . metaxalone (SKELAXIN) 800 MG tablet   Oral   Take 1 tablet by mouth 3 (three) times daily as needed.         . Methotrexate Sodium (METHOTREXATE, PF,) 1 GM/40ML injection      METHOTREXATE SODIUM (PF), 25MG /ML (Injection Solution) - Historical Medication  Prescribed by Endocrinologist (25 MG/ML) Active         . metoprolol tartrate (LOPRESSOR) 25 MG tablet   Oral   Take 1 tablet (25 mg total) by mouth 2 (two) times daily.   60 tablet   11   . montelukast (SINGULAIR) 10 MG tablet   Oral   Take 1 tablet by mouth daily.         [name] omeprazole (PRILOSEC) 20 MG capsule   Oral   Take 20 mg by mouth daily.         . Omeprazole 20 MG TBEC   Oral   Take 1 capsule by mouth daily.         . propranolol ER (INDERAL LA) 160 MG SR capsule   Oral   Take 1 capsule by mouth daily.         . traMADol (ULTRAM) 50 MG tablet   Oral   Take 50 mg by mouth as needed.         . zaleplon (SONATA) 10 MG capsule   Oral   Take 10 mg by mouth as needed.         . zaleplon (SONATA) 10 MG capsule   Oral   Take 1 capsule by mouth at bedtime.          Allergies Effexor and Isosorbide  Family History  Problem Relation Age of Onset  . Stroke Paternal Grandmother   . Heart disease Paternal Grandmother   . Diabetes Maternal Grandmother   . Heart disease Maternal Grandmother   . Hypertension Maternal Grandmother   . Heart disease Maternal Grandfather   . Hypertension Maternal Grandfather   . Heart disease Father   . Stroke Father   . Hypertension Father   . Hyperlipidemia Father   . Diabetes Mother   . Heart attack Mother    Social History History  Substance Use Topics  . Smoking status: Former Smoker -- 1.00 packs/day for 26 years    Types: Cigarettes    Quit date: 07/26/2013  . Smokeless tobacco: Never Used  .  Alcohol Use: No     Comment: Had a history of alcohol use but currently is not drinking any   Review of Systems  Constitutional: Negative for fever. Eyes: Negative for visual changes. ENT: Negative for sore throat. Cardiovascular: Negative for chest pain. Respiratory: Negative for shortness of breath. Gastrointestinal: Negative for abdominal pain, vomiting and diarrhea. Genitourinary: Negative for dysuria. Musculoskeletal: Negative for back pain. Left shoulder pain and right ankle pain as above. Skin: Negative for rash. Neurological: Negative for headaches, focal weakness or numbness. ____________________________________________  PHYSICAL EXAM:  VITAL SIGNS: ED Triage Vitals  Enc Vitals Group  BP --      Pulse --      Resp --      Temp --      Temp src --      SpO2 --      Weight 02/12/15 1850 200 lb (90.719 kg)     Height --      Head Cir --      Peak Flow --      Pain Score 02/12/15 1850 9     Pain Loc --      Pain Edu? --      Excl. in [place]? --    Constitutional: Alert and oriented. Well appearing and in no distress. HEENT: Normocephalic and atraumatic. Conjunctivae are normal. PERRL. Normal extraocular movements.Mucous membranes are moist. Cardiovascular: Normal rate, regular rhythm.  Respiratory: Normal respiratory effort.  Musculoskeletal: Left shoulder with decrease ROM due to pain. Normal rotator cuff strength testing. Right ankle with lateral erythema and soft tissue swelling. No joint effusion is appreciated. Normal ROM, but tenderness to palpation laterally. Negative drawer.  Neurologic:  Normal gait without ataxia. Normal speech and language. No gross focal neurologic deficits are appreciated. Skin:  Skin is warm, dry and intact. No rash noted. Psychiatric: Mood and affect are normal. Patient exhibits appropriate insight and judgment. ____________________________________________    LABS (pertinent positives/negatives) Labs Reviewed  URIC ACID   __________________________________________   RADIOLOGY Left Shoulder IMPRESSION: 1. No acute findings. 2. Moderate glenohumeral osteoarthritis.  Right Ankle IMPRESSION: Soft tissue swelling without osseous abnormality ____________________________________________  PROCEDURES  Toradol 20 mg PO Ace bandage right ankle ____________________________________________  INITIAL IMPRESSION / ASSESSMENT AND PLAN / ED COURSE  Radiology & lab results to patient. She verbalizes improved pain and decreased disability in the right ankle after med administration. Suggest follow-up with Dr. Sabra Heck or her primary ortho specialist, Dr. Nelva Bush for further management of left shoulder DJD with spurring. Prescription Toradol to compliment her recent oxycodone #30 prescription written 6/10 by [facility] according to [place] Controlled Substance Database. ____________________________________________  FINAL CLINICAL IMPRESSION(S) / ED DIAGNOSES  Final diagnoses:  Primary osteoarthritis of left shoulder  Ankle sprain, right, initial encounter     Melvenia Needles, PA-C 02/12/15 [place], MD 02/12/15 2308

## 2015-02-12 NOTE — Discharge Instructions (Signed)
Ankle Sprain An ankle sprain is an injury to the strong, fibrous tissues (ligaments) that hold your ankle bones together.  HOME CARE   Put ice on your ankle for 1-2 days or as told by your doctor.  Put ice in a plastic bag.  Place a towel between your skin and the bag.  Leave the ice on for 15-20 minutes at a time, every 2 hours while you are awake.  Only take medicine as told by your doctor.  Raise (elevate) your injured ankle above the level of your heart as much as possible for 2-3 days.  Use crutches if your doctor tells you to. Slowly put your own weight on the affected ankle. Use the crutches until you can walk without pain.  If you have a plaster splint:  Do not rest it on anything harder than a pillow for 24 hours.  Do not put weight on it.  Do not get it wet.  Take it off to shower or bathe.  If given, use an elastic wrap or support stocking for support. Take the wrap off if your toes lose feeling (numb), tingle, or turn cold or blue.  If you have an air splint:  Add or let out air to make it comfortable.  Take it off at night and to shower and bathe.  Wiggle your toes and move your ankle up and down often while you are wearing it. GET HELP IF:  You have rapidly increasing bruising or puffiness (swelling).  Your toes feel very cold.  You lose feeling in your foot.  Your medicine does not help your pain. GET HELP RIGHT AWAY IF:   Your toes lose feeling (numb) or turn blue.  You have severe pain that is increasing. MAKE SURE YOU:   Understand these instructions.  Will watch your condition.  Will get help right away if you are not doing well or get worse. Document Released: 01/29/2008 Document Revised: 12/27/2013 Document Reviewed: 02/24/2012 [facility] Patient Information 2015 [place], [place]. This information is not intended to replace advice given to you by your health care provider. Make sure you discuss any questions you have with your health care  provider.  Osteoarthritis Osteoarthritis is a disease that causes soreness and inflammation of a joint. It occurs when the cartilage at the affected joint wears down. Cartilage acts as a cushion, covering the ends of bones where they meet to form a joint. Osteoarthritis is the most common form of arthritis. It often occurs in older people. The joints affected most often by this condition include those in the:  Ends of the fingers.  Thumbs.  Neck.  Lower back.  Knees.  Hips. CAUSES  Over time, the cartilage that covers the ends of bones begins to wear away. This causes bone to rub on bone, producing pain and stiffness in the affected joints.  RISK FACTORS Certain factors can increase your chances of having osteoarthritis, including:  Older age.  Excessive body weight.  Overuse of joints.  Previous joint injury. SIGNS AND SYMPTOMS   Pain, swelling, and stiffness in the joint.  Over time, the joint may lose its normal shape.  Small deposits of bone (osteophytes) may grow on the edges of the joint.  Bits of bone or cartilage can break off and float inside the joint space. This may cause more pain and damage. DIAGNOSIS  Your health care provider will do a physical exam and ask about your symptoms. Various tests may be ordered, such as:  X-rays of  the affected joint.  An MRI scan.  Blood tests to rule out other types of arthritis.  Joint fluid tests. This involves using a needle to draw fluid from the joint and examining the fluid under a microscope. TREATMENT  Goals of treatment are to control pain and improve joint function. Treatment plans may include:  A prescribed exercise program that allows for rest and joint relief.  A weight control plan.  Pain relief techniques, such as:  Properly applied heat and cold.  Electric pulses delivered to nerve endings under the skin (transcutaneous electrical nerve stimulation [TENS]).  Massage.  Certain nutritional  supplements.  Medicines to control pain, such as:  Acetaminophen.  Nonsteroidal anti-inflammatory drugs (NSAIDs), such as naproxen.  Narcotic or central-acting agents, such as tramadol.  Corticosteroids. These can be given orally or as an injection.  Surgery to reposition the bones and relieve pain (osteotomy) or to remove loose pieces of bone and cartilage. Joint replacement may be needed in advanced states of osteoarthritis. HOME CARE INSTRUCTIONS   Take medicines only as directed by your health care provider.  Maintain a healthy weight. Follow your health care provider's instructions for weight control. This may include dietary instructions.  Exercise as directed. Your health care provider can recommend specific types of exercise. These may include:  Strengthening exercises. These are done to strengthen the muscles that support joints affected by arthritis. They can be performed with weights or with exercise bands to add resistance.  Aerobic activities. These are exercises, such as brisk walking or low-impact aerobics, that get your heart pumping.  Range-of-motion activities. These keep your joints limber.  Balance and agility exercises. These help you maintain daily living skills.  Rest your affected joints as directed by your health care provider.  Keep all follow-up visits as directed by your health care provider. SEEK MEDICAL CARE IF:   Your skin turns red.  You develop a rash in addition to your joint pain.  You have worsening joint pain.  You have a fever along with joint or muscle aches. SEEK IMMEDIATE MEDICAL CARE IF:  You have a significant loss of weight or appetite.  You have night sweats. [place] of Arthritis and Musculoskeletal and Skin Diseases: www.niams.SouthEx[employer].es  [employer] on Aging: http://kim-miller.com/  [employer] of Rheumatology: www.rheumatology.org Document Released: 08/12/2005 Document Revised:  12/27/2013 Document Reviewed: 04/19/2013 [facility] Patient Information 2015 [place], [place]. This information is not intended to replace advice given to you by your health care provider. Make sure you discuss any questions you have with your health care provider.  Your exam, x-rays, and lab test do not show any bony injury or gout. 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 Take the prescription anti-inflammatory as directed.  Follow-up with Dr. Sabra Heck or Dr. Nelva Bush as needed for ongoing care of joint pains.  Apply ice to reduce symptoms.

## 2015-02-12 NOTE — ED Notes (Signed)
Applied splint

## 2015-02-12 NOTE — ED Notes (Signed)
Patient transported to X-ray 

## 2015-02-12 NOTE — ED Notes (Signed)
Pt here with pain and swelling in right ankle worsening for a couple of days.  no trauma.  Pt has hx of rheumatoid arthritis.

## 2015-02-20 ENCOUNTER — Other Ambulatory Visit: Payer: Self-pay | Admitting: Family Medicine

## 2015-02-20 DIAGNOSIS — I10 Essential (primary) hypertension: Secondary | ICD-10-CM

## 2015-02-23 ENCOUNTER — Encounter: Payer: Self-pay | Admitting: Physician Assistant

## 2015-02-24 ENCOUNTER — Encounter: Payer: Self-pay | Admitting: Physician Assistant

## 2015-03-06 ENCOUNTER — Ambulatory Visit (INDEPENDENT_AMBULATORY_CARE_PROVIDER_SITE_OTHER): Payer: Managed Care, Other (non HMO) | Admitting: Physician Assistant

## 2015-03-06 ENCOUNTER — Encounter: Payer: Self-pay | Admitting: Physician Assistant

## 2015-03-06 VITALS — BP 126/88 | HR 60 | Temp 97.5°F | Resp 18 | Ht 62.5 in | Wt 197.4 lb

## 2015-03-06 DIAGNOSIS — Z3042 Encounter for surveillance of injectable contraceptive: Secondary | ICD-10-CM

## 2015-03-06 DIAGNOSIS — Z124 Encounter for screening for malignant neoplasm of cervix: Secondary | ICD-10-CM

## 2015-03-06 DIAGNOSIS — Z1239 Encounter for other screening for malignant neoplasm of breast: Secondary | ICD-10-CM | POA: Diagnosis not present

## 2015-03-06 DIAGNOSIS — Z01419 Encounter for gynecological examination (general) (routine) without abnormal findings: Secondary | ICD-10-CM | POA: Diagnosis not present

## 2015-03-06 DIAGNOSIS — Z309 Encounter for contraceptive management, unspecified: Secondary | ICD-10-CM | POA: Diagnosis not present

## 2015-03-06 DIAGNOSIS — G259 Extrapyramidal and movement disorder, unspecified: Secondary | ICD-10-CM | POA: Diagnosis not present

## 2015-03-06 DIAGNOSIS — Z0000 Encounter for general adult medical examination without abnormal findings: Secondary | ICD-10-CM | POA: Diagnosis not present

## 2015-03-06 LAB — POCT URINE PREGNANCY: PREG TEST UR: NEGATIVE

## 2015-03-06 MED ORDER — MEDROXYPROGESTERONE ACETATE 150 MG/ML IM SUSP
150.0000 mg | Freq: Once | INTRAMUSCULAR | Status: AC
  Administered 2015-03-06: 150 mg via INTRAMUSCULAR

## 2015-03-06 MED ORDER — LISDEXAMFETAMINE DIMESYLATE 20 MG PO CAPS: 20.0000 mg | ORAL_CAPSULE | Freq: Every day | ORAL | Status: DC

## 2015-03-06 NOTE — Patient Instructions (Signed)
Preventive Care for Adults A healthy lifestyle and preventive care can promote health and wellness. Preventive health guidelines for women include the following key practices.  A routine yearly physical is a good way to check with your health care provider about your health and preventive screening. It is a chance to share any concerns and updates on your health and to receive a thorough exam.  Visit your dentist for a routine exam and preventive care every 6 months. Brush your teeth twice a day and floss once a day. Good oral hygiene prevents tooth decay and gum disease.  The frequency of eye exams is based on your age, health, family medical history, use of contact lenses, and other factors. Follow your health care provider's recommendations for frequency of eye exams.  Eat a healthy diet. Foods like vegetables, fruits, whole grains, low-fat dairy products, and lean protein foods contain the nutrients you need without too many calories. Decrease your intake of foods high in solid fats, added sugars, and salt. Eat the right amount of calories for you.Get information about a proper diet from your health care provider, if necessary.  Regular physical exercise is one of the most important things you can do for your health. Most adults should get at least 150 minutes of moderate-intensity exercise (any activity that increases your heart rate and causes you to sweat) each week. In addition, most adults need muscle-strengthening exercises on 2 or more days a week.  Maintain a healthy weight. The body mass index (BMI) is a screening tool to identify possible weight problems. It provides an estimate of body fat based on height and weight. Your health care provider can find your BMI and can help you achieve or maintain a healthy weight.For adults 20 years and older:  A BMI below 18.5 is considered underweight.  A BMI of 18.5 to 24.9 is normal.  A BMI of 25 to 29.9 is considered overweight.  A BMI of  30 and above is considered obese.  Maintain normal blood lipids and cholesterol levels by exercising and minimizing your intake of saturated fat. Eat a balanced diet with plenty of fruit and vegetables. Blood tests for lipids and cholesterol should begin at age 20 and be repeated every 5 years. If your lipid or cholesterol levels are high, you are over 50, or you are at high risk for heart disease, you may need your cholesterol levels checked more frequently.Ongoing high lipid and cholesterol levels should be treated with medicines if diet and exercise are not working.  If you smoke, find out from your health care provider how to quit. If you do not use tobacco, do not start.  Lung cancer screening is recommended for adults aged 55-80 years who are at high risk for developing lung cancer because of a history of smoking. A yearly low-dose CT scan of the lungs is recommended for people who have at least a 30-pack-year history of smoking and are a current smoker or have quit within the past 15 years. A pack year of smoking is smoking an average of 1 pack of cigarettes a day for 1 year (for example: 1 pack a day for 30 years or 2 packs a day for 15 years). Yearly screening should continue until the smoker has stopped smoking for at least 15 years. Yearly screening should be stopped for people who develop a health problem that would prevent them from having lung cancer treatment.  If you are pregnant, do not drink alcohol. If you are breastfeeding,   be very cautious about drinking alcohol. If you are not pregnant and choose to drink alcohol, do not have more than 1 drink per day. One drink is considered to be 12 ounces (355 mL) of beer, 5 ounces (148 mL) of wine, or 1.5 ounces (44 mL) of liquor.  Avoid use of street drugs. Do not share needles with anyone. Ask for help if you need support or instructions about stopping the use of drugs.  High blood pressure causes heart disease and increases the risk of  stroke. Your blood pressure should be checked at least every 1 to 2 years. Ongoing high blood pressure should be treated with medicines if weight loss and exercise do not work.  If you are 3-86 years old, ask your health care provider if you should take aspirin to prevent strokes.  Diabetes screening involves taking a blood sample to check your fasting blood sugar level. This should be done once every 3 years, after age 67, if you are within normal weight and without risk factors for diabetes. Testing should be considered at a younger age or be carried out more frequently if you are overweight and have at least 1 risk factor for diabetes.  Breast cancer screening is essential preventive care for women. You should practice "breast self-awareness." This means understanding the normal appearance and feel of your breasts and may include breast self-examination. Any changes detected, no matter how small, should be reported to a health care provider. Women in their 8s and 30s should have a clinical breast exam (CBE) by a health care provider as part of a regular health exam every 1 to 3 years. After age 70, women should have a CBE every year. Starting at age 25, women should consider having a mammogram (breast X-ray test) every year. Women who have a family history of breast cancer should talk to their health care provider about genetic screening. Women at a high risk of breast cancer should talk to their health care providers about having an MRI and a mammogram every year.  Breast cancer gene (BRCA)-related cancer risk assessment is recommended for women who have family members with BRCA-related cancers. BRCA-related cancers include breast, ovarian, tubal, and peritoneal cancers. Having family members with these cancers may be associated with an increased risk for harmful changes (mutations) in the breast cancer genes BRCA1 and BRCA2. Results of the assessment will determine the need for genetic counseling and  BRCA1 and BRCA2 testing.  Routine pelvic exams to screen for cancer are no longer recommended for nonpregnant women who are considered low risk for cancer of the pelvic organs (ovaries, uterus, and vagina) and who do not have symptoms. Ask your health care provider if a screening pelvic exam is right for you.  If you have had past treatment for cervical cancer or a condition that could lead to cancer, you need Pap tests and screening for cancer for at least 20 years after your treatment. If Pap tests have been discontinued, your risk factors (such as having a new sexual partner) need to be reassessed to determine if screening should be resumed. Some women have medical problems that increase the chance of getting cervical cancer. In these cases, your health care provider may recommend more frequent screening and Pap tests.  The HPV test is an additional test that may be used for cervical cancer screening. The HPV test looks for the virus that can cause the cell changes on the cervix. The cells collected during the Pap test can be  tested for HPV. The HPV test could be used to screen women aged 30 years and older, and should be used in women of any age who have unclear Pap test results. After the age of 30, women should have HPV testing at the same frequency as a Pap test.  Colorectal cancer can be detected and often prevented. Most routine colorectal cancer screening begins at the age of 50 years and continues through age 75 years. However, your health care provider may recommend screening at an earlier age if you have risk factors for colon cancer. On a yearly basis, your health care provider may provide home test kits to check for hidden blood in the stool. Use of a small camera at the end of a tube, to directly examine the colon (sigmoidoscopy or colonoscopy), can detect the earliest forms of colorectal cancer. Talk to your health care provider about this at age 50, when routine screening begins. Direct  exam of the colon should be repeated every 5-10 years through age 75 years, unless early forms of pre-cancerous polyps or small growths are found.  People who are at an increased risk for hepatitis B should be screened for this virus. You are considered at high risk for hepatitis B if:  You were born in a country where hepatitis B occurs often. Talk with your health care provider about which countries are considered high risk.  Your parents were born in a high-risk country and you have not received a shot to protect against hepatitis B (hepatitis B vaccine).  You have HIV or AIDS.  You use needles to inject street drugs.  You live with, or have sex with, someone who has hepatitis B.  You get hemodialysis treatment.  You take certain medicines for conditions like cancer, organ transplantation, and autoimmune conditions.  Hepatitis C blood testing is recommended for all people born from 1945 through 1965 and any individual with known risks for hepatitis C.  Practice safe sex. Use condoms and avoid high-risk sexual practices to reduce the spread of sexually transmitted infections (STIs). STIs include gonorrhea, chlamydia, syphilis, trichomonas, herpes, HPV, and human immunodeficiency virus (HIV). Herpes, HIV, and HPV are viral illnesses that have no cure. They can result in disability, cancer, and death.  You should be screened for sexually transmitted illnesses (STIs) including gonorrhea and chlamydia if:  You are sexually active and are younger than 24 years.  You are older than 24 years and your health care provider tells you that you are at risk for this type of infection.  Your sexual activity has changed since you were last screened and you are at an increased risk for chlamydia or gonorrhea. Ask your health care provider if you are at risk.  If you are at risk of being infected with HIV, it is recommended that you take a prescription medicine daily to prevent HIV infection. This is  called preexposure prophylaxis (PrEP). You are considered at risk if:  You are a heterosexual woman, are sexually active, and are at increased risk for HIV infection.  You take drugs by injection.  You are sexually active with a partner who has HIV.  Talk with your health care provider about whether you are at high risk of being infected with HIV. If you choose to begin PrEP, you should first be tested for HIV. You should then be tested every 3 months for as long as you are taking PrEP.  Osteoporosis is a disease in which the bones lose minerals and strength   with aging. This can result in serious bone fractures or breaks. The risk of osteoporosis can be identified using a bone density scan. Women ages 65 years and over and women at risk for fractures or osteoporosis should discuss screening with their health care providers. Ask your health care provider whether you should take a calcium supplement or vitamin D to reduce the rate of osteoporosis.  Menopause can be associated with physical symptoms and risks. Hormone replacement therapy is available to decrease symptoms and risks. You should talk to your health care provider about whether hormone replacement therapy is right for you.  Use sunscreen. Apply sunscreen liberally and repeatedly throughout the day. You should seek shade when your shadow is shorter than you. Protect yourself by wearing long sleeves, pants, a wide-brimmed hat, and sunglasses year round, whenever you are outdoors.  Once a month, do a whole body skin exam, using a mirror to look at the skin on your back. Tell your health care provider of new moles, moles that have irregular borders, moles that are larger than a pencil eraser, or moles that have changed in shape or color.  Stay current with required vaccines (immunizations).  Influenza vaccine. All adults should be immunized every year.  Tetanus, diphtheria, and acellular pertussis (Td, Tdap) vaccine. Pregnant women should  receive 1 dose of Tdap vaccine during each pregnancy. The dose should be obtained regardless of the length of time since the last dose. Immunization is preferred during the 27th-36th week of gestation. An adult who has not previously received Tdap or who does not know her vaccine status should receive 1 dose of Tdap. This initial dose should be followed by tetanus and diphtheria toxoids (Td) booster doses every 10 years. Adults with an unknown or incomplete history of completing a 3-dose immunization series with Td-containing vaccines should begin or complete a primary immunization series including a Tdap dose. Adults should receive a Td booster every 10 years.  Varicella vaccine. An adult without evidence of immunity to varicella should receive 2 doses or a second dose if she has previously received 1 dose. Pregnant females who do not have evidence of immunity should receive the first dose after pregnancy. This first dose should be obtained before leaving the health care facility. The second dose should be obtained 4-8 weeks after the first dose.  Human papillomavirus (HPV) vaccine. Females aged 13-26 years who have not received the vaccine previously should obtain the 3-dose series. The vaccine is not recommended for use in pregnant females. However, pregnancy testing is not needed before receiving a dose. If a female is found to be pregnant after receiving a dose, no treatment is needed. In that case, the remaining doses should be delayed until after the pregnancy. Immunization is recommended for any person with an immunocompromised condition through the age of 26 years if she did not get any or all doses earlier. During the 3-dose series, the second dose should be obtained 4-8 weeks after the first dose. The third dose should be obtained 24 weeks after the first dose and 16 weeks after the second dose.  Zoster vaccine. One dose is recommended for adults aged 60 years or older unless certain conditions are  present.  Measles, mumps, and rubella (MMR) vaccine. Adults born before 1957 generally are considered immune to measles and mumps. Adults born in 1957 or later should have 1 or more doses of MMR vaccine unless there is a contraindication to the vaccine or there is laboratory evidence of immunity to   each of the three diseases. A routine second dose of MMR vaccine should be obtained at least 28 days after the first dose for students attending postsecondary schools, health care workers, or international travelers. People who received inactivated measles vaccine or an unknown type of measles vaccine during 1963-1967 should receive 2 doses of MMR vaccine. People who received inactivated mumps vaccine or an unknown type of mumps vaccine before 1979 and are at high risk for mumps infection should consider immunization with 2 doses of MMR vaccine. For females of childbearing age, rubella immunity should be determined. If there is no evidence of immunity, females who are not pregnant should be vaccinated. If there is no evidence of immunity, females who are pregnant should delay immunization until after pregnancy. Unvaccinated health care workers born before 1957 who lack laboratory evidence of measles, mumps, or rubella immunity or laboratory confirmation of disease should consider measles and mumps immunization with 2 doses of MMR vaccine or rubella immunization with 1 dose of MMR vaccine.  Pneumococcal 13-valent conjugate (PCV13) vaccine. When indicated, a person who is uncertain of her immunization history and has no record of immunization should receive the PCV13 vaccine. An adult aged 19 years or older who has certain medical conditions and has not been previously immunized should receive 1 dose of PCV13 vaccine. This PCV13 should be followed with a dose of pneumococcal polysaccharide (PPSV23) vaccine. The PPSV23 vaccine dose should be obtained at least 8 weeks after the dose of PCV13 vaccine. An adult aged 19  years or older who has certain medical conditions and previously received 1 or more doses of PPSV23 vaccine should receive 1 dose of PCV13. The PCV13 vaccine dose should be obtained 1 or more years after the last PPSV23 vaccine dose.  Pneumococcal polysaccharide (PPSV23) vaccine. When PCV13 is also indicated, PCV13 should be obtained first. All adults aged 65 years and older should be immunized. An adult younger than age 65 years who has certain medical conditions should be immunized. Any person who resides in a nursing home or long-term care facility should be immunized. An adult smoker should be immunized. People with an immunocompromised condition and certain other conditions should receive both PCV13 and PPSV23 vaccines. People with human immunodeficiency virus (HIV) infection should be immunized as soon as possible after diagnosis. Immunization during chemotherapy or radiation therapy should be avoided. Routine use of PPSV23 vaccine is not recommended for American Indians, [place] Natives, or people younger than 65 years unless there are medical conditions that require PPSV23 vaccine. When indicated, people who have unknown immunization and have no record of immunization should receive PPSV23 vaccine. One-time revaccination 5 years after the first dose of PPSV23 is recommended for people aged 19-64 years who have chronic kidney failure, nephrotic syndrome, asplenia, or immunocompromised conditions. People who received 1-2 doses of PPSV23 before age 65 years should receive another dose of PPSV23 vaccine at age 65 years or later if at least 5 years have passed since the previous dose. Doses of PPSV23 are not needed for people immunized with PPSV23 at or after age 65 years.  Meningococcal vaccine. Adults with asplenia or persistent complement component deficiencies should receive 2 doses of quadrivalent meningococcal conjugate (MenACWY-D) vaccine. The doses should be obtained at least 2 months apart.  Microbiologists working with certain meningococcal bacteria, military recruits, people at risk during an outbreak, and people who travel to or live in countries with a high rate of meningitis should be immunized. A first-year college student up through age   21 years who is living in a residence hall should receive a dose if she did not receive a dose on or after her 16th birthday. Adults who have certain high-risk conditions should receive one or more doses of vaccine.  Hepatitis A vaccine. Adults who wish to be protected from this disease, have certain high-risk conditions, work with hepatitis A-infected animals, work in hepatitis A research labs, or travel to or work in countries with a high rate of hepatitis A should be immunized. Adults who were previously unvaccinated and who anticipate close contact with an international adoptee during the first 60 days after arrival in the [place] from a country with a high rate of hepatitis A should be immunized.  Hepatitis B vaccine. Adults who wish to be protected from this disease, have certain high-risk conditions, may be exposed to blood or other infectious body fluids, are household contacts or sex partners of hepatitis B positive people, are clients or workers in certain care facilities, or travel to or work in countries with a high rate of hepatitis B should be immunized.  Haemophilus influenzae type b (Hib) vaccine. A previously unvaccinated person with asplenia or sickle cell disease or having a scheduled splenectomy should receive 1 dose of Hib vaccine. Regardless of previous immunization, a recipient of a hematopoietic stem cell transplant should receive a 3-dose series 6-12 months after her successful transplant. Hib vaccine is not recommended for adults with HIV infection. Preventive Services / Frequency Ages 64 to 68 years  Blood pressure check.** / Every 1 to 2 years.  Lipid and cholesterol check.** / Every 5 years beginning at age  22.  Clinical breast exam.** / Every 3 years for women in their 88s and 53s.  BRCA-related cancer risk assessment.** / For women who have family members with a BRCA-related cancer (breast, ovarian, tubal, or peritoneal cancers).  Pap test.** / Every 2 years from ages 90 through 51. Every 3 years starting at age 21 through age 56 or 3 with a history of 3 consecutive normal Pap tests.  HPV screening.** / Every 3 years from ages 24 through ages 1 to 46 with a history of 3 consecutive normal Pap tests.  Hepatitis C blood test.** / For any individual with known risks for hepatitis C.  Skin self-exam. / Monthly.  Influenza vaccine. / Every year.  Tetanus, diphtheria, and acellular pertussis (Tdap, Td) vaccine.** / Consult your health care provider. Pregnant women should receive 1 dose of Tdap vaccine during each pregnancy. 1 dose of Td every 10 years.  Varicella vaccine.** / Consult your health care provider. Pregnant females who do not have evidence of immunity should receive the first dose after pregnancy.  HPV vaccine. / 3 doses over 6 months, if 72 and younger. The vaccine is not recommended for use in pregnant females. However, pregnancy testing is not needed before receiving a dose.  Measles, mumps, rubella (MMR) vaccine.** / You need at least 1 dose of MMR if you were born in 1957 or later. You may also need a 2nd dose. For females of childbearing age, rubella immunity should be determined. If there is no evidence of immunity, females who are not pregnant should be vaccinated. If there is no evidence of immunity, females who are pregnant should delay immunization until after pregnancy.  Pneumococcal 13-valent conjugate (PCV13) vaccine.** / Consult your health care provider.  Pneumococcal polysaccharide (PPSV23) vaccine.** / 1 to 2 doses if you smoke cigarettes or if you have certain conditions.  Meningococcal vaccine.** /  1 dose if you are age 19 to 21 years and a first-year college  student living in a residence hall, or have one of several medical conditions, you need to get vaccinated against meningococcal disease. You may also need additional booster doses.  Hepatitis A vaccine.** / Consult your health care provider.  Hepatitis B vaccine.** / Consult your health care provider.  Haemophilus influenzae type b (Hib) vaccine.** / Consult your health care provider. Ages 40 to 64 years  Blood pressure check.** / Every 1 to 2 years.  Lipid and cholesterol check.** / Every 5 years beginning at age 20 years.  Lung cancer screening. / Every year if you are aged 55-80 years and have a 30-pack-year history of smoking and currently smoke or have quit within the past 15 years. Yearly screening is stopped once you have quit smoking for at least 15 years or develop a health problem that would prevent you from having lung cancer treatment.  Clinical breast exam.** / Every year after age 40 years.  BRCA-related cancer risk assessment.** / For women who have family members with a BRCA-related cancer (breast, ovarian, tubal, or peritoneal cancers).  Mammogram.** / Every year beginning at age 40 years and continuing for as long as you are in good health. Consult with your health care provider.  Pap test.** / Every 3 years starting at age 30 years through age 65 or 70 years with a history of 3 consecutive normal Pap tests.  HPV screening.** / Every 3 years from ages 30 years through ages 65 to 70 years with a history of 3 consecutive normal Pap tests.  Fecal occult blood test (FOBT) of stool. / Every year beginning at age 50 years and continuing until age 75 years. You may not need to do this test if you get a colonoscopy every 10 years.  Flexible sigmoidoscopy or colonoscopy.** / Every 5 years for a flexible sigmoidoscopy or every 10 years for a colonoscopy beginning at age 50 years and continuing until age 75 years.  Hepatitis C blood test.** / For all people born from 1945 through  1965 and any individual with known risks for hepatitis C.  Skin self-exam. / Monthly.  Influenza vaccine. / Every year.  Tetanus, diphtheria, and acellular pertussis (Tdap/Td) vaccine.** / Consult your health care provider. Pregnant women should receive 1 dose of Tdap vaccine during each pregnancy. 1 dose of Td every 10 years.  Varicella vaccine.** / Consult your health care provider. Pregnant females who do not have evidence of immunity should receive the first dose after pregnancy.  Zoster vaccine.** / 1 dose for adults aged 60 years or older.  Measles, mumps, rubella (MMR) vaccine.** / You need at least 1 dose of MMR if you were born in 1957 or later. You may also need a 2nd dose. For females of childbearing age, rubella immunity should be determined. If there is no evidence of immunity, females who are not pregnant should be vaccinated. If there is no evidence of immunity, females who are pregnant should delay immunization until after pregnancy.  Pneumococcal 13-valent conjugate (PCV13) vaccine.** / Consult your health care provider.  Pneumococcal polysaccharide (PPSV23) vaccine.** / 1 to 2 doses if you smoke cigarettes or if you have certain conditions.  Meningococcal vaccine.** / Consult your health care provider.  Hepatitis A vaccine.** / Consult your health care provider.  Hepatitis B vaccine.** / Consult your health care provider.  Haemophilus influenzae type b (Hib) vaccine.** / Consult your health care provider. Ages 65   years and over  Blood pressure check.** / Every 1 to 2 years.  Lipid and cholesterol check.** / Every 5 years beginning at age 22 years.  Lung cancer screening. / Every year if you are aged 73-80 years and have a 30-pack-year history of smoking and currently smoke or have quit within the past 15 years. Yearly screening is stopped once you have quit smoking for at least 15 years or develop a health problem that would prevent you from having lung cancer  treatment.  Clinical breast exam.** / Every year after age 4 years.  BRCA-related cancer risk assessment.** / For women who have family members with a BRCA-related cancer (breast, ovarian, tubal, or peritoneal cancers).  Mammogram.** / Every year beginning at age 40 years and continuing for as long as you are in good health. Consult with your health care provider.  Pap test.** / Every 3 years starting at age 9 years through age 34 or 91 years with 3 consecutive normal Pap tests. Testing can be stopped between 65 and 70 years with 3 consecutive normal Pap tests and no abnormal Pap or HPV tests in the past 10 years.  HPV screening.** / Every 3 years from ages 57 years through ages 64 or 45 years with a history of 3 consecutive normal Pap tests. Testing can be stopped between 65 and 70 years with 3 consecutive normal Pap tests and no abnormal Pap or HPV tests in the past 10 years.  Fecal occult blood test (FOBT) of stool. / Every year beginning at age 15 years and continuing until age 17 years. You may not need to do this test if you get a colonoscopy every 10 years.  Flexible sigmoidoscopy or colonoscopy.** / Every 5 years for a flexible sigmoidoscopy or every 10 years for a colonoscopy beginning at age 86 years and continuing until age 71 years.  Hepatitis C blood test.** / For all people born from [birth year] through 1963 and any individual with known risks for hepatitis C.  Osteoporosis screening.** / A one-time screening for women ages 83 years and over and women at risk for fractures or osteoporosis.  Skin self-exam. / Monthly.  Influenza vaccine. / Every year.  Tetanus, diphtheria, and acellular pertussis (Tdap/Td) vaccine.** / 1 dose of Td every 10 years.  Varicella vaccine.** / Consult your health care provider.  Zoster vaccine.** / 1 dose for adults aged 61 years or older.  Pneumococcal 13-valent conjugate (PCV13) vaccine.** / Consult your health care provider.  Pneumococcal  polysaccharide (PPSV23) vaccine.** / 1 dose for all adults aged 28 years and older.  Meningococcal vaccine.** / Consult your health care provider.  Hepatitis A vaccine.** / Consult your health care provider.  Hepatitis B vaccine.** / Consult your health care provider.  Haemophilus influenzae type b (Hib) vaccine.** / Consult your health care provider. ** Family history and personal history of risk and conditions may change your health care provider's recommendations. Document Released: 10/08/2001 Document Revised: 12/27/2013 Document Reviewed: 01/07/2011 [facility] Patient Information 2015 [place], [place]. This information is not intended to replace advice given to you by your health care provider. Make sure you discuss any questions you have with your health care provider.

## 2015-03-07 NOTE — Progress Notes (Signed)
Subjective:    Patient ID: Susan Gilmore, female    DOB: [birth year], 49 y.o.   MRN: [id number]  Gynecologic Exam The patient's primary symptoms include missed menses (on depo-provera). The patient's pertinent negatives include no genital itching, genital lesions, genital odor, genital rash, pelvic pain, vaginal bleeding or vaginal discharge. The patient is experiencing no pain. She is not pregnant. Associated symptoms include back pain. Pertinent negatives include no abdominal pain, anorexia, chills, constipation, diarrhea, discolored urine, dysuria, fever, flank pain, frequency, headaches, hematuria, joint pain, joint swelling, nausea, painful intercourse, rash, sore throat, urgency or vomiting. She is not sexually active. She uses progestin injections for contraception. Menstrual history: no menstrual cycle since starting depo-provera many years ago. There is no history of an abdominal surgery, a Cesarean section, an ectopic pregnancy, endometriosis, a gynecological surgery, herpes simplex, menorrhagia, metrorrhagia, miscarriage, ovarian cysts, perineal abscess, PID, an STD, a terminated pregnancy or vaginosis.     Review of Systems  Constitutional: Negative for fever and chills.  HENT: Positive for sinus pressure. Negative for sore throat.   Eyes: Positive for discharge and itching.  Respiratory: Positive for wheezing. Negative for cough, chest tightness and shortness of breath.   Cardiovascular: Negative.  Negative for chest pain and palpitations.  Gastrointestinal: Negative for nausea, vomiting, abdominal pain, diarrhea, constipation and anorexia.  Endocrine: Negative.   Genitourinary: Positive for missed menses (on depo-provera). Negative for dysuria, urgency, frequency, hematuria, flank pain, vaginal discharge, pelvic pain and menorrhagia.  Musculoskeletal: Positive for myalgias, back pain, joint swelling and arthralgias. Negative for joint pain.  Skin: Negative for rash.   Allergic/Immunologic: Negative.   Neurological: Positive for weakness. Negative for dizziness, seizures, syncope, numbness and headaches.  Hematological: Negative.   Psychiatric/Behavioral: Negative.        Objective:   Physical Exam  Constitutional: She is oriented to person, place, and time. She appears well-developed and well-nourished. No distress.  HENT:  Head: Normocephalic and atraumatic.  Right Ear: Hearing, tympanic membrane, external ear and ear canal normal.  Left Ear: Hearing, tympanic membrane, external ear and ear canal normal.  Nose: Nose normal.  Mouth/Throat: Uvula is midline, oropharynx is clear and moist and mucous membranes are normal. No oropharyngeal exudate.  Eyes: Conjunctivae and EOM are normal. Pupils are equal, round, and reactive to light. Right eye exhibits no discharge. Left eye exhibits no discharge. No scleral icterus.  Neck: Normal range of motion. Neck supple. No JVD present. Carotid bruit is not present. No tracheal deviation present. No thyromegaly present.  Cardiovascular: Normal rate, regular rhythm, normal heart sounds and intact distal pulses.  Exam reveals no gallop and no friction rub.   No murmur heard. Pulmonary/Chest: Effort normal and breath sounds normal. No respiratory distress. She has no wheezes. She has no rales. She exhibits no tenderness. Right breast exhibits no inverted nipple, no mass, no nipple discharge, no skin change and no tenderness. Left breast exhibits no inverted nipple, no mass, no nipple discharge, no skin change and no tenderness. Breasts are symmetrical.  Abdominal: Soft. Bowel sounds are normal. She exhibits no distension and no mass. There is no tenderness. There is no rebound and no guarding. Hernia confirmed negative in the right inguinal area and confirmed negative in the left inguinal area.  Genitourinary: Rectum normal, vagina normal and uterus normal. No breast swelling, tenderness, discharge or bleeding. Pelvic exam  was performed with patient supine. There is no rash, tenderness, lesion or injury on the right labia. There is no rash, tenderness,  lesion or injury on the left labia. Cervix exhibits discharge (white, milky). Cervix exhibits no motion tenderness and no friability. Right adnexum displays no mass, no tenderness and no fullness. Left adnexum displays no mass, no tenderness and no fullness. No erythema, tenderness or bleeding in the vagina. No signs of injury around the vagina. No vaginal discharge found.  Musculoskeletal: Normal range of motion. She exhibits no edema or tenderness.  Lymphadenopathy:    She has no cervical adenopathy.       Right: No inguinal adenopathy present.       Left: No inguinal adenopathy present.  Neurological: She is alert and oriented to person, place, and time. She has normal reflexes. No cranial nerve deficit. Coordination normal.  Skin: Skin is warm and dry. No rash noted. She is not diaphoretic.  Psychiatric: She has a normal mood and affect. Her behavior is normal. Judgment and thought content normal.  Vitals reviewed.         Assessment & Plan:  1. 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 - Pap IG and HPV (high risk) DNA detection ([facility])  2. Breast screening - [facility] DIGITAL SCREENING BILATERAL; Future  3. Encounter for routine gynecological examination - Pap IG and HPV (high risk) DNA detection ([facility])  4. Encounter for surveillance of injectable contraceptive [Z30.42] Depo-provera injection given.  Patient provided medication. - POCT urine pregnancy  5. Abnormal leg movement Has "fidgeting" of her legs while she is awake.  If she is sitting up she has to constantly move her legs.  She states she was previously worked up for this and tried multiple medications and found that Vyvanse worked best for her.  Will try Vyvanse again and f/u in 4 weeks. - lisdexamfetamine (VYVANSE)  20 MG capsule; Take 1 capsule (20 mg total) by mouth daily.  Dispense: 30 capsule; Refill: 0  6. Encounter for contraceptive management, unspecified encounter Depo-provera injection given. - medroxyPROGESTERone (DEPO-PROVERA) injection 150 mg; Inject 1 mL (150 mg total) into the muscle once.

## 2015-03-09 ENCOUNTER — Telehealth: Payer: Self-pay

## 2015-03-09 DIAGNOSIS — G259 Extrapyramidal and movement disorder, unspecified: Secondary | ICD-10-CM

## 2015-03-09 LAB — PAP IG AND HPV HIGH-RISK: PAP Smear Comment: 0

## 2015-03-09 NOTE — Telephone Encounter (Signed)
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  Will repeat next year (2017).

## 2015-03-09 NOTE — Telephone Encounter (Signed)
LMTCB

## 2015-03-10 ENCOUNTER — Telehealth: Payer: Self-pay | Admitting: Physician Assistant

## 2015-03-10 NOTE — Telephone Encounter (Signed)
I do not feel she should increase the dose as of yet.  Also due to the lack of documentation of previous vyvanse administration I would actually like to have her officially tested for ADD/ADHD to see if this may be cause of the involuntary leg movements and warrant the vyvanse Rx.  I spoke with Dr. Rosanna Randy about this and he agrees that official testing would be best and then proceed with titration of vyvanse afterwards.  Thanks! -JB

## 2015-03-10 NOTE — Telephone Encounter (Signed)
Patient advised as directed below. Patient verbalized understanding.  

## 2015-03-10 NOTE — Telephone Encounter (Signed)
Can we see if Dr. Nicolasa Ducking has tested her for ADHD and if so can we get those results, or have them test her at her f/u with them?  Thanks. -JB

## 2015-03-10 NOTE — Telephone Encounter (Signed)
Patient advised as directed below. Patient verbalized understanding.   Patient states that Vyvanse 1 tablet is not helping. Patient states that you have mentioned that she may need 2 tablets daily. Please advise.

## 2015-03-10 NOTE — Telephone Encounter (Signed)
Pt did not want appointment to psychologist.She states that she is a pt of Dr Starleen Arms

## 2015-03-15 ENCOUNTER — Other Ambulatory Visit: Payer: Self-pay | Admitting: Family Medicine

## 2015-03-15 ENCOUNTER — Telehealth: Payer: Self-pay | Admitting: Physician Assistant

## 2015-03-15 DIAGNOSIS — J302 Other seasonal allergic rhinitis: Secondary | ICD-10-CM

## 2015-03-15 DIAGNOSIS — K219 Gastro-esophageal reflux disease without esophagitis: Secondary | ICD-10-CM

## 2015-03-15 DIAGNOSIS — J309 Allergic rhinitis, unspecified: Secondary | ICD-10-CM | POA: Insufficient documentation

## 2015-03-15 MED ORDER — FLUTICASONE PROPIONATE 50 MCG/ACT NA SUSP: 2.0000 | Freq: Every day | NASAL | Status: DC

## 2015-03-15 NOTE — Telephone Encounter (Signed)
Pt called wanting to know why she can not get the refill on her nasal spray.  She said the pharmacy said it was denied but no reason.   Please call patient.  Thanks Con Memos

## 2015-03-15 NOTE — Telephone Encounter (Signed)
Refilled fluticasone.  Not sure why it was denied.  Will try again to fill.  If denied again it may be insurance issue.  Some insurance companies quit covering some medications once they are OTC.

## 2015-03-17 NOTE — Telephone Encounter (Signed)
Pt is returning call.  QM#[phone]/AQ

## 2015-03-17 NOTE — Telephone Encounter (Signed)
LMTCB  aa 

## 2015-03-21 MED ORDER — OMEPRAZOLE 20 MG PO CPDR: 20.0000 mg | DELAYED_RELEASE_CAPSULE | Freq: Every day | ORAL | Status: DC

## 2015-03-21 NOTE — Telephone Encounter (Signed)
Omeprazole refilled and sent to [facility] [address]. Thanks! -JB

## 2015-03-21 NOTE — Telephone Encounter (Signed)
Patient advised as directed below. Patient states she has picked RX up from the pharmacy. Patient is also requesting a refill for Omeprazole 20 mg.

## 2015-04-03 NOTE — Telephone Encounter (Signed)
Dr Starleen Arms office states that Dr Toy Care has given pt diagnosis of ADHD but they do not do formal testing for this

## 2015-04-03 NOTE — Telephone Encounter (Signed)
Per Sharyn Lull at Dr Waylan Boga office.Pt has never been seen in their office.They do not do testing for ADHD

## 2015-04-03 NOTE — Telephone Encounter (Signed)
I am so sorry I meant Dr. Toy Care in [place]. That was my typing mistake.  Thanks! -JB

## 2015-05-13 ENCOUNTER — Other Ambulatory Visit: Payer: Self-pay | Admitting: Family Medicine

## 2015-05-25 ENCOUNTER — Other Ambulatory Visit: Payer: Self-pay

## 2015-05-25 ENCOUNTER — Ambulatory Visit: Payer: Managed Care, Other (non HMO) | Admitting: Physician Assistant

## 2015-05-26 NOTE — Telephone Encounter (Signed)
Susan Gilmore I don't think this message was ever sent to you

## 2015-06-05 ENCOUNTER — Encounter: Payer: Self-pay | Admitting: Physician Assistant

## 2015-06-05 ENCOUNTER — Ambulatory Visit (INDEPENDENT_AMBULATORY_CARE_PROVIDER_SITE_OTHER): Payer: Managed Care, Other (non HMO) | Admitting: Physician Assistant

## 2015-06-05 VITALS — BP 160/80 | HR 98 | Temp 97.9°F | Resp 18 | Wt 203.0 lb

## 2015-06-05 DIAGNOSIS — D84821 Immunodeficiency due to drugs: Secondary | ICD-10-CM | POA: Insufficient documentation

## 2015-06-05 DIAGNOSIS — Z23 Encounter for immunization: Secondary | ICD-10-CM

## 2015-06-05 DIAGNOSIS — R05 Cough: Secondary | ICD-10-CM | POA: Diagnosis not present

## 2015-06-05 DIAGNOSIS — Z7952 Long term (current) use of systemic steroids: Secondary | ICD-10-CM | POA: Insufficient documentation

## 2015-06-05 DIAGNOSIS — J45909 Unspecified asthma, uncomplicated: Secondary | ICD-10-CM | POA: Insufficient documentation

## 2015-06-05 DIAGNOSIS — M069 Rheumatoid arthritis, unspecified: Secondary | ICD-10-CM | POA: Insufficient documentation

## 2015-06-05 DIAGNOSIS — J4541 Moderate persistent asthma with (acute) exacerbation: Secondary | ICD-10-CM | POA: Diagnosis not present

## 2015-06-05 DIAGNOSIS — Z3042 Encounter for surveillance of injectable contraceptive: Secondary | ICD-10-CM | POA: Diagnosis not present

## 2015-06-05 DIAGNOSIS — R059 Cough, unspecified: Secondary | ICD-10-CM

## 2015-06-05 DIAGNOSIS — T380X5A Adverse effect of glucocorticoids and synthetic analogues, initial encounter: Secondary | ICD-10-CM | POA: Insufficient documentation

## 2015-06-05 DIAGNOSIS — M0579 Rheumatoid arthritis with rheumatoid factor of multiple sites without organ or systems involvement: Secondary | ICD-10-CM

## 2015-06-05 MED ORDER — ALBUTEROL SULFATE HFA 108 (90 BASE) MCG/ACT IN AERS: 2.0000 | INHALATION_SPRAY | Freq: Four times a day (QID) | RESPIRATORY_TRACT | Status: DC | PRN

## 2015-06-05 MED ORDER — MEDROXYPROGESTERONE ACETATE 150 MG/ML IM SUSP
150.0000 mg | Freq: Once | INTRAMUSCULAR | Status: AC
  Administered 2015-06-05: 150 mg via INTRAMUSCULAR

## 2015-06-05 MED ORDER — HYDROCODONE-HOMATROPINE 5-1.5 MG/5ML PO SYRP: 5.0000 mL | ORAL_SOLUTION | Freq: Three times a day (TID) | ORAL | Status: DC | PRN

## 2015-06-05 NOTE — Patient Instructions (Signed)
Pneumococcal Vaccine, Polyvalent suspension for injection What is this medicine? PNEUMOCOCCAL VACCINE (NEU mo KOK al vak SEEN) is a vaccine used to prevent pneumococcus bacterial infections. These bacteria can cause serious infections like pneumonia, meningitis, and blood infections. This vaccine will lower your chance of getting pneumonia. If you do get pneumonia, it can make your symptoms milder and your illness shorter. This vaccine will not treat an infection and will not cause infection. This vaccine is recommended for infants and young children, adults with certain medical conditions, and adults 65 years or older. This medicine may be used for other purposes; ask your health care provider or pharmacist if you have questions. What should I tell my health care provider before I take this medicine? They need to know if you have any of these conditions: -bleeding problems -fever -immune system problems -an unusual or allergic reaction to pneumococcal vaccine, diphtheria toxoid, other vaccines, latex, other medicines, foods, dyes, or preservatives -pregnant or trying to get pregnant -breast-feeding How should I use this medicine? This vaccine is for injection into a muscle. It is given by a health care professional. A copy of Vaccine Information Statements will be given before each vaccination. Read this sheet carefully each time. The sheet may change frequently. Talk to your pediatrician regarding the use of this medicine in children. While this drug may be prescribed for children as young as 71 weeks old for selected conditions, precautions do apply. Overdosage: If you think you have taken too much of this medicine contact a poison control center or emergency room at once. NOTE: This medicine is only for you. Do not share this medicine with others. What if I miss a dose? It is important not to miss your dose. Call your doctor or health care professional if you are unable to keep an  appointment. What may interact with this medicine? -medicines for cancer chemotherapy -medicines that suppress your immune function -steroid medicines like prednisone or cortisone This list may not describe all possible interactions. Give your health care provider a list of all the medicines, herbs, non-prescription drugs, or dietary supplements you use. Also tell them if you smoke, drink alcohol, or use illegal drugs. Some items may interact with your medicine. What should I watch for while using this medicine? Mild fever and pain should go away in 3 days or less. Report any unusual symptoms to your doctor or health care professional. What side effects may I notice from receiving this medicine? Side effects that you should report to your doctor or health care professional as soon as possible: -allergic reactions like skin rash, itching or hives, swelling of the face, lips, or tongue -breathing problems -confused -fast or irregular heartbeat -fever over 102 degrees F -seizures -unusual bleeding or bruising -unusual muscle weakness Side effects that usually do not require medical attention (report to your doctor or health care professional if they continue or are bothersome): -aches and pains -diarrhea -fever of 102 degrees F or less -headache -irritable -loss of appetite -pain, tender at site where injected -trouble sleeping This list may not describe all possible side effects. Call your doctor for medical advice about side effects. You may report side effects to FDA at 1-800-FDA-1088. Where should I keep my medicine? This does not apply. This vaccine is given in a clinic, pharmacy, doctor's office, or other health care setting and will not be stored at home. NOTE: This sheet is a summary. It may not cover all possible information. If you have questions about this  medicine, talk to your doctor, pharmacist, or health care provider.    2016, Elsevier/Gold Standard. (2014-05-19  10:27:27) Influenza Virus Vaccine injection (Fluarix) What is this medicine? INFLUENZA VIRUS VACCINE (in floo EN zuh VAHY ruhs vak SEEN) helps to reduce the risk of getting influenza also known as the flu. This medicine may be used for other purposes; ask your health care provider or pharmacist if you have questions. What should I tell my health care provider before I take this medicine? They need to know if you have any of these conditions: -bleeding disorder like hemophilia -fever or infection -Guillain-Barre syndrome or other neurological problems -immune system problems -infection with the human immunodeficiency virus (HIV) or AIDS -low blood platelet counts -multiple sclerosis -an unusual or allergic reaction to influenza virus vaccine, eggs, chicken proteins, latex, gentamicin, other medicines, foods, dyes or preservatives -pregnant or trying to get pregnant -breast-feeding How should I use this medicine? This vaccine is for injection into a muscle. It is given by a health care professional. A copy of Vaccine Information Statements will be given before each vaccination. Read this sheet carefully each time. The sheet may change frequently. Talk to your pediatrician regarding the use of this medicine in children. Special care may be needed. Overdosage: If you think you have taken too much of this medicine contact a poison control center or emergency room at once. NOTE: This medicine is only for you. Do not share this medicine with others. What if I miss a dose? This does not apply. What may interact with this medicine? -chemotherapy or radiation therapy -medicines that lower your immune system like etanercept, anakinra, infliximab, and adalimumab -medicines that treat or prevent blood clots like warfarin -phenytoin -steroid medicines like prednisone or cortisone -theophylline -vaccines This list may not describe all possible interactions. Give your health care provider a list of  all the medicines, herbs, non-prescription drugs, or dietary supplements you use. Also tell them if you smoke, drink alcohol, or use illegal drugs. Some items may interact with your medicine. What should I watch for while using this medicine? Report any side effects that do not go away within 3 days to your doctor or health care professional. Call your health care provider if any unusual symptoms occur within 6 weeks of receiving this vaccine. You may still catch the flu, but the illness is not usually as bad. You cannot get the flu from the vaccine. The vaccine will not protect against colds or other illnesses that may cause fever. The vaccine is needed every year. What side effects may I notice from receiving this medicine? Side effects that you should report to your doctor or health care professional as soon as possible: -allergic reactions like skin rash, itching or hives, swelling of the face, lips, or tongue Side effects that usually do not require medical attention (report to your doctor or health care professional if they continue or are bothersome): -fever -headache -muscle aches and pains -pain, tenderness, redness, or swelling at site where injected -weak or tired This list may not describe all possible side effects. Call your doctor for medical advice about side effects. You may report side effects to FDA at 1-800-FDA-1088. Where should I keep my medicine? This vaccine is only given in a clinic, pharmacy, doctor's office, or other health care setting and will not be stored at home. NOTE: This sheet is a summary. It may not cover all possible information. If you have questions about this medicine, talk to your doctor, pharmacist, or  health care provider.    2016, Elsevier/Gold Standard. (2008-03-09 09:30:40) Asthma, Adult Asthma is a recurring condition in which the airways tighten and narrow. Asthma can make it difficult to breathe. It can cause coughing, wheezing, and shortness of  breath. Asthma episodes, also called asthma attacks, range from minor to life-threatening. Asthma cannot be cured, but medicines and lifestyle changes can help control it. CAUSES Asthma is believed to be caused by inherited (genetic) and environmental factors, but its exact cause is unknown. Asthma may be triggered by allergens, lung infections, or irritants in the air. Asthma triggers are different for each person. Common triggers include:   Animal dander.  Dust mites.  Cockroaches.  Pollen from trees or grass.  Mold.  Smoke.  Air pollutants such as dust, household cleaners, hair sprays, aerosol sprays, paint fumes, strong chemicals, or strong odors.  Cold air, weather changes, and winds (which increase molds and pollens in the air).  Strong emotional expressions such as crying or laughing hard.  Stress.  Certain medicines (such as aspirin) or types of drugs (such as beta-blockers).  Sulfites in foods and drinks. Foods and drinks that may contain sulfites include dried fruit, potato chips, and sparkling grape juice.  Infections or inflammatory conditions such as the flu, a cold, or an inflammation of the nasal membranes (rhinitis).  Gastroesophageal reflux disease (GERD).  Exercise or strenuous activity. SYMPTOMS Symptoms may occur immediately after asthma is triggered or many hours later. Symptoms include:  Wheezing.  Excessive nighttime or early morning coughing.  Frequent or severe coughing with a common cold.  Chest tightness.  Shortness of breath. DIAGNOSIS  The diagnosis of asthma is made by a review of your medical history and a physical exam. Tests may also be performed. These may include:  Lung function studies. These tests show how much air you breathe in and out.  Allergy tests.  Imaging tests such as X-rays. TREATMENT  Asthma cannot be cured, but it can usually be controlled. Treatment involves identifying and avoiding your asthma triggers. It also  involves medicines. There are 2 classes of medicine used for asthma treatment:   Controller medicines. These prevent asthma symptoms from occurring. They are usually taken every day.  Reliever or rescue medicines. These quickly relieve asthma symptoms. They are used as needed and provide short-term relief. Your health care provider will help you create an asthma action plan. An asthma action plan is a written plan for managing and treating your asthma attacks. It includes a list of your asthma triggers and how they may be avoided. It also includes information on when medicines should be taken and when their dosage should be changed. An action plan may also involve the use of a device called a peak flow meter. A peak flow meter measures how well the lungs are working. It helps you monitor your condition. HOME CARE INSTRUCTIONS   Take medicines only as directed by your health care provider. Speak with your health care provider if you have questions about how or when to take the medicines.  Use a peak flow meter as directed by your health care provider. Record and keep track of readings.  Understand and use the action plan to help minimize or stop an asthma attack without needing to seek medical care.  Control your home environment in the following ways to help prevent asthma attacks:  Do not smoke. Avoid being exposed to secondhand smoke.  Change your heating and air conditioning filter regularly.  Limit your use of  fireplaces and wood stoves.  Get rid of pests (such as roaches and mice) and their droppings.  Throw away plants if you see mold on them.  Clean your floors and dust regularly. Use unscented cleaning products.  Try to have someone else vacuum for you regularly. Stay out of rooms while they are being vacuumed and for a short while afterward. If you vacuum, use a dust mask from a hardware store, a double-layered or microfilter vacuum cleaner bag, or a vacuum cleaner with a HEPA  filter.  Replace carpet with wood, tile, or vinyl flooring. Carpet can trap dander and dust.  Use allergy-proof pillows, mattress covers, and box spring covers.  Wash bed sheets and blankets every week in hot water and dry them in a dryer.  Use blankets that are made of polyester or cotton.  Clean bathrooms and kitchens with bleach. If possible, have someone repaint the walls in these rooms with mold-resistant paint. Keep out of the rooms that are being cleaned and painted.  Wash hands frequently. SEEK MEDICAL CARE IF:   You have wheezing, shortness of breath, or a cough even if taking medicine to prevent attacks.  The colored mucus you cough up (sputum) is thicker than usual.  Your sputum changes from clear or white to yellow, green, gray, or bloody.  You have any problems that may be related to the medicines you are taking (such as a rash, itching, swelling, or trouble breathing).  You are using a reliever medicine more than 2-3 times per week.  Your peak flow is still at 50-79% of your personal best after following your action plan for 1 hour.  You have a fever. SEEK IMMEDIATE MEDICAL CARE IF:   You seem to be getting worse and are unresponsive to treatment during an asthma attack.  You are short of breath even at rest.  You get short of breath when doing very little physical activity.  You have difficulty eating, drinking, or talking due to asthma symptoms.  You develop chest pain.  You develop a fast heartbeat.  You have a bluish color to your lips or fingernails.  You are light-headed, dizzy, or faint.  Your peak flow is less than 50% of your personal best.   This information is not intended to replace advice given to you by your health care provider. Make sure you discuss any questions you have with your health care provider.   Document Released: 08/12/2005 Document Revised: 05/03/2015 Document Reviewed: 03/11/2013 Elsevier Interactive Patient Education [employer]

## 2015-06-07 NOTE — Progress Notes (Signed)
Subjective:     Patient ID: Susan Gilmore, female   DOB: 1965-12-08, 49 y.o.   MRN: [id number]  HPI Susan Gilmore is a 49 year old female that returns turns to the office today for her Depo-Provera injection. She also has a complaint of a cough that started approximately 5-7 days ago. She states she is coughing up thick yellow to green mucus. She denies any fevers, chills, shortness of breath or wheezing currently. She does have a history of asthma and has an Advair inhaler and albuterol inhaler. She states that she is out of her albuterol inhaler and would like a refill to help with her symptoms. She does state that her cough is worse at night when she lays down. She does have a past medical history of severe rheumatoid arthritis and is currently on prednisone and methotrexate for this. These medications to lower her immune system and make it easier for her to get a bacterial infection quickly.  Review of Systems  Constitutional: Negative for fever, chills, diaphoresis, appetite change and fatigue.  HENT: Positive for congestion, postnasal drip, rhinorrhea and sinus pressure. Negative for ear discharge, ear pain, sneezing, sore throat, tinnitus, trouble swallowing and voice change.   Eyes: Negative for photophobia, discharge, redness and visual disturbance.  Respiratory: Positive for cough and wheezing (occasional when she lies down but not often). Negative for chest tightness and shortness of breath.   Cardiovascular: Negative for chest pain and palpitations.  Gastrointestinal: Negative for nausea and vomiting.  Genitourinary: Negative.        Objective:   Physical Exam  Constitutional: She appears well-developed and well-nourished. No distress.  HENT:  Head: Normocephalic and atraumatic.  Right Ear: Hearing, tympanic membrane, external ear and ear canal normal.  Left Ear: Hearing, tympanic membrane, external ear and ear canal normal.  Nose: Nose normal. Right sinus exhibits no maxillary  sinus tenderness and no frontal sinus tenderness. Left sinus exhibits no maxillary sinus tenderness and no frontal sinus tenderness.  Mouth/Throat: Uvula is midline, oropharynx is clear and moist and mucous membranes are normal. No oropharyngeal exudate.  Eyes: Conjunctivae and EOM are normal. Pupils are equal, round, and reactive to light. Right eye exhibits no discharge. Left eye exhibits no discharge.  Neck: Normal range of motion. Neck supple. No JVD present. No tracheal deviation present. No Brudzinski's sign and no Kernig's sign noted. No thyromegaly present.  Cardiovascular: Normal rate, regular rhythm and normal heart sounds.  Exam reveals no gallop and no friction rub.   No murmur heard. Pulmonary/Chest: Effort normal and breath sounds normal. No stridor. No respiratory distress. She has no wheezes. She has no rales. She exhibits no tenderness.  Lymphadenopathy:    She has no cervical adenopathy.  Skin: Skin is warm and dry. She is not diaphoretic.  Vitals reviewed.      Assessment:     1. Encounter for surveillance of injectable contraceptive   2. Asthma, moderate persistent, with acute exacerbation   3. Cough   4. Need for pneumococcal vaccination   5. Need for influenza vaccination   6. Rheumatoid arthritis involving multiple sites with positive rheumatoid factor (HCC)   7. 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 Encounter for surveillance of injectable contraceptive Depo-Provera injection given without complications. - medroxyPROGESTERone (DEPO-PROVERA) injection 150 mg; Inject 1 mL (150 mg total) into the muscle once.  2. Asthma, moderate persistent, with acute exacerbation Worsening cough. Will refill albuterol inhaler  for now. She is to call the office if she develops any worsening symptoms. - albuterol (PROVENTIL HFA;VENTOLIN HFA) 108 (90 BASE) MCG/ACT inhaler; Inhale 2 puffs into the lungs every 6 (six) hours as needed.  Dispense: 1  Inhaler; Refill: 11  3. Cough Hycodan cough syrup refilled as below. This is to help with nighttime cough and allow her to be able to rest. She is to call the office if symptoms fail to improve or persist. - HYDROcodone-homatropine (HYCODAN) 5-1.5 MG/5ML syrup; Take 5 mLs by mouth every 8 (eight) hours as needed for cough.  Dispense: 240 mL; Refill: 0  4. Need for pneumococcal vaccination Prevnar 13 was given to patient without complication. She has previously had the pneumococcal 23 vaccine in 2013. She is immunocompromised secondary to methotrexate and prednisone for rheumatoid arthritis. - Pneumococcal conjugate vaccine 13-valent  5. Need for influenza vaccination Flu vaccine was given today due to minimal symptoms and no fever. She tolerated this well and had no complications. - Flu Vaccine QUAD 36+ mos IM  6. Rheumatoid arthritis involving multiple sites with positive rheumatoid factor ([place]) Followed by rheumatology. She is currently on methotrexate and prednisone. They did try a trial run of Humira which she did not respond well to. This is the cause for her being immunocompromised and requiring the above vaccinations.  7. 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

## 2015-06-28 DIAGNOSIS — M47817 Spondylosis without myelopathy or radiculopathy, lumbosacral region: Secondary | ICD-10-CM | POA: Diagnosis not present

## 2015-07-07 DIAGNOSIS — M79642 Pain in left hand: Secondary | ICD-10-CM | POA: Diagnosis not present

## 2015-07-07 DIAGNOSIS — M069 Rheumatoid arthritis, unspecified: Secondary | ICD-10-CM | POA: Insufficient documentation

## 2015-07-07 DIAGNOSIS — M79671 Pain in right foot: Secondary | ICD-10-CM | POA: Diagnosis present

## 2015-07-07 DIAGNOSIS — Z87891 Personal history of nicotine dependence: Secondary | ICD-10-CM | POA: Diagnosis not present

## 2015-07-07 DIAGNOSIS — Z7952 Long term (current) use of systemic steroids: Secondary | ICD-10-CM | POA: Insufficient documentation

## 2015-07-07 DIAGNOSIS — I10 Essential (primary) hypertension: Secondary | ICD-10-CM | POA: Insufficient documentation

## 2015-07-07 DIAGNOSIS — M79641 Pain in right hand: Secondary | ICD-10-CM | POA: Diagnosis not present

## 2015-07-07 DIAGNOSIS — Z79899 Other long term (current) drug therapy: Secondary | ICD-10-CM | POA: Insufficient documentation

## 2015-07-08 ENCOUNTER — Encounter: Payer: Self-pay | Admitting: *Deleted

## 2015-07-08 ENCOUNTER — Emergency Department
Admission: EM | Admit: 2015-07-08 | Discharge: 2015-07-08 | Disposition: A | Discharge status: Home / Self Care | Payer: Managed Care, Other (non HMO) | Attending: Emergency Medicine | Admitting: Emergency Medicine

## 2015-07-08 DIAGNOSIS — M069 Rheumatoid arthritis, unspecified: Secondary | ICD-10-CM

## 2015-07-08 MED ORDER — PREDNISONE 20 MG PO TABS
60.0000 mg | ORAL_TABLET | Freq: Once | ORAL | Status: AC
  Administered 2015-07-08: 60 mg via ORAL
  Filled 2015-07-08: qty 3

## 2015-07-08 MED ORDER — PREDNISONE 10 MG PO TABS: 10.0000 mg | ORAL_TABLET | Freq: Every day | ORAL | Status: DC

## 2015-07-08 MED ORDER — OXYCODONE-ACETAMINOPHEN 5-325 MG PO TABS
1.0000 | ORAL_TABLET | Freq: Once | ORAL | Status: AC
  Administered 2015-07-08: 1 via ORAL
  Filled 2015-07-08: qty 1

## 2015-07-08 NOTE — ED Provider Notes (Signed)
[facility] Emergency Department Provider Note  ____________________________________________  Time seen: Approximately 2:30 AM  I have reviewed the triage vital signs and the nursing notes.   HISTORY  Chief Complaint Foot Pain    HPI Sarye Buehrer Wargel is a 49 y.o. female with history of rheumatoid arthritis was presenting today with bilateral hand and foot pain. She says the pain is cramping and has been worsening over the past 3 days. She denies any fever home. Says that she was supposed to take her shot of new rheumatoid arthritis medication today but that it never came in the mail. However, she says it will be here tomorrow.She says that she is on a chronic low dose of prednisone at 10-15 mg per day. She says that she also takes methotrexate. The patient does not a history of diabetes. She says the pain is worse in her hands at her MCP joints and also at the toes the metatarsal phalangeal joints.   Past Medical History  Diagnosis Date  . Chest pain   . Hyperplastic colon polyp   . Allergic rhinitis   . Palpitations   . HPV (human papilloma virus) anogenital infection   . Contraception   . Cough   . Abnormal stress test   . Carpal tunnel syndrome   . Chronic anxiety   . Insomnia   . Pneumonia   . Bipolar disorder ([place])   . HTN (hypertension)   . Abnormal Pap smear 04/28/2012    normal pap /positive hpv     Patient Active Problem List   Diagnosis Date Noted  . Asthma 06/05/2015  . Rheumatoid arthritis ([place]) 06/05/2015  . 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 Allergic rhinitis 03/15/2015  . ADD (attention deficit disorder) 12/29/2014  . 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 Back ache 12/29/2014  . Bipolar disorder ([place]) 12/29/2014  . Carpal tunnel syndrome 12/29/2014  . Chest pain 12/29/2014  . Chronic anxiety 12/29/2014  . Elevated blood sugar 12/29/2014  . Elevated rheumatoid factor 12/29/2014  . Acid reflux  12/29/2014  . Infectious human wart virus 12/29/2014  . Benign neoplasm of colon 12/29/2014  . Cephalalgia 12/29/2014  . Cannot sleep 12/29/2014  . Awareness of heartbeats 12/29/2014  . Flutter-fibrillation 12/29/2014  . LAD (lymphadenopathy), retroperitoneal 12/29/2014  . Fast heart beat 12/29/2014  . Systolic dysfunction [id number]  . Avitaminosis D 12/29/2014  . Paroxysmal digital cyanosis 02/17/2014  . Ventricular bigeminy 01/14/2012  . Hypertension 01/14/2012  . Abnormal EKG 01/14/2012  . Tobacco abuse 01/14/2012  . Benign essential HTN 12/09/2003    Past Surgical History  Procedure Laterality Date  . Ganglion cyst excision    . Wisdom tooth extraction    . Full mouth dental      Current Outpatient Rx  Name  Route  Sig  Dispense  Refill  . Adalimumab 40 MG/0.8ML PNKT   Subcutaneous   Inject 40 mg into the skin every 14 (fourteen) days.         [name] ADVAIR DISKUS 250-50 MCG/DOSE AEPB      INHALE 1 PUFF BY MOUTH TWICE A DAY   60 each   6   . albuterol (PROVENTIL HFA;VENTOLIN HFA) 108 (90 BASE) MCG/ACT inhaler   Inhalation   Inhale 2 puffs into the lungs every 6 (six) hours as needed.   1 Inhaler   11   . ALPRAZolam (XANAX) 1 MG tablet   Oral   Take 1 mg by mouth as needed.         [name]  amphetamine-dextroamphetamine (ADDERALL XR) 30 MG 24 hr capsule   Oral   Take 60 mg by mouth daily.      0   . B-D TB SYRINGE 1CC/27GX1/2" 27G X 1/2" 1 ML MISC      USE TO INJECT METHOTREXATE WEEKLY 30 DAYS      3     Dispense as written.   . DULoxetine (CYMBALTA) 60 MG capsule   Oral   Take 60 mg by mouth daily.         . Eszopiclone 3 MG TABS               . fexofenadine (ALLEGRA) 180 MG tablet   Oral   Take 180 mg by mouth daily.         . fluticasone (FLONASE) 50 MCG/ACT nasal spray   Each Nare   Place 2 sprays into both nostrils daily.   16 g   11   . folic acid (FOLVITE) 1 MG tablet   Oral   Take 1 tablet by mouth 2 (two) times daily.          . hydrochlorothiazide (HYDRODIURIL) 12.5 MG tablet   Oral   Take 1 tablet by mouth daily.         [name] HYDROcodone-acetaminophen (NORCO) 7.5-325 MG tablet               . HYDROcodone-homatropine (HYCODAN) 5-1.5 MG/5ML syrup   Oral   Take 5 mLs by mouth every 8 (eight) hours as needed for cough.   240 mL   0   . loratadine (CLARITIN) 10 MG tablet   Oral   Take 1 tablet by mouth daily.         . medroxyPROGESTERone (DEPO-PROVERA) 150 MG/ML injection   Intramuscular   Inject 150 mg into the muscle every 3 (three) months.         . Menthol-Methyl Salicylate (MUSCLE RUB) 10-15 % CREA   Topical   Apply 1 application topically as needed.         . methotrexate 50 MG/2ML injection      INJECT 1 ML ONCE WEEKLY AS DIRECTED      2   . Methotrexate Sodium (METHOTREXATE, PF,) 1 GM/40ML injection      METHOTREXATE SODIUM (PF), 25MG /ML (Injection Solution) - Historical Medication  Prescribed by Endocrinologist (25 MG/ML) Active         . montelukast (SINGULAIR) 10 MG tablet   Oral   Take 1 tablet by mouth daily.         [name] omeprazole (PRILOSEC) 20 MG capsule   Oral   Take 1 capsule (20 mg total) by mouth daily.   30 capsule   11   . predniSONE (STERAPRED UNI-PAK 48 TAB) 5 MG (48) TBPK tablet      USE AS DIRECTED DAILY FOR 12 DAYS      0   . propranolol ER (INDERAL LA) 160 MG SR capsule      TAKE ONE CAPSULE BY MOUTH EVERY DAY **STOP 120MG **   30 capsule   6     Allergies Effexor and Isosorbide  Family History  Problem Relation Age of Onset  . Stroke Paternal Grandmother   . Heart disease Paternal Grandmother   . Diabetes Maternal Grandmother   . Heart disease Maternal Grandmother   . Hypertension Maternal Grandmother   . Heart disease Maternal Grandfather   . Hypertension Maternal Grandfather   . Heart disease Father   . Stroke  Father   . Hypertension Father   . Hyperlipidemia Father   . Diabetes Mother   . Heart attack Mother     Social  History Social History  Substance Use Topics  . Smoking status: Former Smoker -- 1.00 packs/day for 26 years    Types: Cigarettes    Quit date: 07/26/2013  . Smokeless tobacco: Never Used  . Alcohol Use: No     Comment: Had a history of alcohol use but currently is not drinking any    Review of Systems Constitutional: No fever/chills Eyes: No visual changes. ENT: No sore throat. Cardiovascular: Denies chest pain. Respiratory: Denies shortness of breath. Gastrointestinal: No abdominal pain.  No nausea, no vomiting.  No diarrhea.  No constipation. Genitourinary: Negative for dysuria. Musculoskeletal: Negative for back pain. Skin: Negative for rash. Neurological: Negative for headaches, focal weakness or numbness.  10-point ROS otherwise negative.  ____________________________________________   PHYSICAL EXAM:  VITAL SIGNS: ED Triage Vitals  Enc Vitals Group     BP 07/08/15 0030 119/78 mmHg     Pulse Rate 07/08/15 0030 100     Resp 07/08/15 0030 18     Temp 07/08/15 0030 98.1 F (36.7 C)     Temp Source 07/08/15 0030 Oral     SpO2 07/08/15 0030 98 %     Weight 07/08/15 0030 200 lb (90.719 kg)     Height 07/08/15 0030 5\' 1"  (1.549 m)     Head Cir --      Peak Flow --      Pain Score 07/08/15 0026 10     Pain Loc --      Pain Edu? --      Excl. in [place]? --     Constitutional: Alert and oriented. Well appearing and in no acute distress. Eyes: Conjunctivae are normal. PERRL. EOMI. Head: Atraumatic. Nose: No congestion/rhinnorhea. Mouth/Throat: Mucous membranes are moist.   Neck: No stridor.   Cardiovascular: Normal rate, regular rhythm. Grossly normal heart sounds.  Good peripheral circulation. Respiratory: Normal respiratory effort.  No retractions. Lungs CTAB. Gastrointestinal: Soft and nontender. No distention. No abdominal bruits. No CVA tenderness. Musculoskeletal: No lower extremity  edema.  No joint effusions. No erythematous or swollen joints. The patient has  full range of her toes as well as her fingers. There is no swelling of the joints. There is no warmth either. Mild tenderness to the MCP as well as MTP joints. Neurologic:  Normal speech and language. No gross focal neurologic deficits are appreciated. No gait instability. Skin:  Skin is warm, dry and intact. Plaque-like rash to the dorsal surface of the foot anteriorly which the patient says is her "psoriasis."  Psychiatric: Mood and affect are normal. Speech and behavior are normal.  ____________________________________________   LABS (all labs ordered are listed, but only abnormal results are displayed)  Labs Reviewed - No data to display ____________________________________________  EKG   ____________________________________________  RADIOLOGY   ____________________________________________   PROCEDURES   ____________________________________________   INITIAL IMPRESSION / ASSESSMENT AND PLAN / ED COURSE  Pertinent labs & imaging results that were available during my care of the patient were reviewed by me and considered in my medical decision making (see chart for details).  The patient seems to be having a flare of rheumatoid arthritis but without any swollen or infected joints on her exam. I will give her a burst of prednisone to go home with with a taper. She will also be given a dose of pain medication  in the emergency department. However, she says that she does not like to take "pain medication." However, given her pain at this time she says that she will except this one dose. ____________________________________________   FINAL CLINICAL IMPRESSION(S) / ED DIAGNOSES  Rheumatoid arthritis flare.    Orbie Pyo, MD 07/08/15 [phone]

## 2015-07-08 NOTE — ED Notes (Addendum)
Pt reports bilateral foot pain x several days.  Started with her thumbs and hands hurting last week.  Hx of RA-reports that she thinks its a flare up because she has been out of her medication x 1 weeks (last dose was last week, she takes it once a week-supposed to started a new medication this week.)

## 2015-07-24 ENCOUNTER — Telehealth: Payer: Self-pay | Admitting: Physician Assistant

## 2015-07-24 DIAGNOSIS — R059 Cough, unspecified: Secondary | ICD-10-CM

## 2015-07-24 DIAGNOSIS — R05 Cough: Secondary | ICD-10-CM

## 2015-07-24 MED ORDER — HYDROCODONE-HOMATROPINE 5-1.5 MG/5ML PO SYRP: 5.0000 mL | ORAL_SOLUTION | Freq: Three times a day (TID) | ORAL | Status: DC | PRN

## 2015-07-24 NOTE — Telephone Encounter (Signed)
Rx has been printed and is up front for pick up.

## 2015-07-24 NOTE — Telephone Encounter (Signed)
Patient advised as directed below.  Thanks,  -Joseline 

## 2015-07-24 NOTE — Telephone Encounter (Signed)
Pt states she has a dry cough for about 2 weeks.  Pt is requesting a cough medication.  [facility].  [id number]

## 2015-07-28 DIAGNOSIS — M5136 Other intervertebral disc degeneration, lumbar region: Secondary | ICD-10-CM | POA: Diagnosis not present

## 2015-07-28 DIAGNOSIS — M5416 Radiculopathy, lumbar region: Secondary | ICD-10-CM | POA: Diagnosis not present

## 2015-08-09 ENCOUNTER — Other Ambulatory Visit: Payer: Self-pay | Admitting: Family Medicine

## 2015-08-09 DIAGNOSIS — M19049 Primary osteoarthritis, unspecified hand: Secondary | ICD-10-CM

## 2015-08-15 DIAGNOSIS — M5416 Radiculopathy, lumbar region: Secondary | ICD-10-CM | POA: Diagnosis not present

## 2015-09-14 ENCOUNTER — Other Ambulatory Visit: Payer: Self-pay | Admitting: Family Medicine

## 2015-09-14 DIAGNOSIS — I10 Essential (primary) hypertension: Secondary | ICD-10-CM

## 2015-09-19 ENCOUNTER — Other Ambulatory Visit: Payer: Self-pay | Admitting: Physician Assistant

## 2015-09-19 DIAGNOSIS — L409 Psoriasis, unspecified: Secondary | ICD-10-CM | POA: Diagnosis not present

## 2015-09-19 DIAGNOSIS — M0589 Other rheumatoid arthritis with rheumatoid factor of multiple sites: Secondary | ICD-10-CM | POA: Diagnosis not present

## 2015-09-19 DIAGNOSIS — Z79899 Other long term (current) drug therapy: Secondary | ICD-10-CM | POA: Diagnosis not present

## 2015-09-19 DIAGNOSIS — M2550 Pain in unspecified joint: Secondary | ICD-10-CM | POA: Diagnosis not present

## 2015-09-19 DIAGNOSIS — R059 Cough, unspecified: Secondary | ICD-10-CM

## 2015-09-19 DIAGNOSIS — R05 Cough: Secondary | ICD-10-CM

## 2015-09-19 MED ORDER — HYDROCODONE-HOMATROPINE 5-1.5 MG/5ML PO SYRP: 5.0000 mL | ORAL_SOLUTION | Freq: Three times a day (TID) | ORAL | Status: DC | PRN

## 2015-09-19 NOTE — Telephone Encounter (Signed)
Pt contacted office for refill request on the following medications: HYDROcodone-homatropine (HYCODAN) 5-1.5 MG/5ML syrup. Thanks TNP

## 2015-09-20 ENCOUNTER — Other Ambulatory Visit: Payer: Self-pay | Admitting: Physician Assistant

## 2015-09-20 DIAGNOSIS — Z3040 Encounter for surveillance of contraceptives, unspecified: Secondary | ICD-10-CM

## 2015-09-20 MED ORDER — MEDROXYPROGESTERONE ACETATE 150 MG/ML IM SUSP: 150.0000 mg | INTRAMUSCULAR | Status: DC

## 2015-09-20 NOTE — Telephone Encounter (Signed)
Patient is overdue for Depoprovera and needs this called in so she can come get her shot tomorrow at 11:15.   Call to [facility]

## 2015-09-20 NOTE — Telephone Encounter (Signed)
Please inform patient medication sent to [facility].

## 2015-09-20 NOTE — Telephone Encounter (Signed)
Patient advised as directed below.  Thanks,  -Hawkin Charo 

## 2015-09-21 ENCOUNTER — Ambulatory Visit (INDEPENDENT_AMBULATORY_CARE_PROVIDER_SITE_OTHER): Payer: Managed Care, Other (non HMO) | Admitting: Physician Assistant

## 2015-09-21 ENCOUNTER — Encounter: Payer: Self-pay | Admitting: Physician Assistant

## 2015-09-21 VITALS — BP 150/80 | HR 95 | Temp 97.8°F | Resp 14 | Wt 206.0 lb

## 2015-09-21 DIAGNOSIS — J0100 Acute maxillary sinusitis, unspecified: Secondary | ICD-10-CM | POA: Diagnosis not present

## 2015-09-21 DIAGNOSIS — Z3042 Encounter for surveillance of injectable contraceptive: Secondary | ICD-10-CM | POA: Diagnosis not present

## 2015-09-21 DIAGNOSIS — J4541 Moderate persistent asthma with (acute) exacerbation: Secondary | ICD-10-CM

## 2015-09-21 LAB — POCT URINE PREGNANCY: Preg Test, Ur: NEGATIVE

## 2015-09-21 MED ORDER — IPRATROPIUM-ALBUTEROL 0.5-2.5 (3) MG/3ML IN SOLN
3.0000 mL | Freq: Once | RESPIRATORY_TRACT | Status: AC
  Administered 2015-09-21: 3 mL via RESPIRATORY_TRACT

## 2015-09-21 MED ORDER — ALBUTEROL SULFATE HFA 108 (90 BASE) MCG/ACT IN AERS: 2.0000 | INHALATION_SPRAY | Freq: Four times a day (QID) | RESPIRATORY_TRACT | Status: DC | PRN

## 2015-09-21 MED ORDER — MOXIFLOXACIN HCL 400 MG PO TABS: 400.0000 mg | ORAL_TABLET | Freq: Every day | ORAL | Status: DC

## 2015-09-21 MED ORDER — MEDROXYPROGESTERONE ACETATE 150 MG/ML IM SUSP
150.0000 mg | Freq: Once | INTRAMUSCULAR | Status: AC
  Administered 2015-09-21: 150 mg via INTRAMUSCULAR

## 2015-09-21 MED ORDER — IPRATROPIUM-ALBUTEROL 0.5-2.5 (3) MG/3ML IN SOLN: 3.0000 mL | Freq: Four times a day (QID) | RESPIRATORY_TRACT | Status: DC | PRN

## 2015-09-21 NOTE — Patient Instructions (Addendum)
Patient is to return for her next injection Apr 13-Apr 27.  Thanks.  JER  Asthma, Adult Asthma is a recurring condition in which the airways tighten and narrow. Asthma can make it difficult to breathe. It can cause coughing, wheezing, and shortness of breath. Asthma episodes, also called asthma attacks, range from minor to life-threatening. Asthma cannot be cured, but medicines and lifestyle changes can help control it. CAUSES Asthma is believed to be caused by inherited (genetic) and environmental factors, but its exact cause is unknown. Asthma may be triggered by allergens, lung infections, or irritants in the air. Asthma triggers are different for each person. Common triggers include:   Animal dander.  Dust mites.  Cockroaches.  Pollen from trees or grass.  Mold.  Smoke.  Air pollutants such as dust, household cleaners, hair sprays, aerosol sprays, paint fumes, strong chemicals, or strong odors.  Cold air, weather changes, and winds (which increase molds and pollens in the air).  Strong emotional expressions such as crying or laughing hard.  Stress.  Certain medicines (such as aspirin) or types of drugs (such as beta-blockers).  Sulfites in foods and drinks. Foods and drinks that may contain sulfites include dried fruit, potato chips, and sparkling grape juice.  Infections or inflammatory conditions such as the flu, a cold, or an inflammation of the nasal membranes (rhinitis).  Gastroesophageal reflux disease (GERD).  Exercise or strenuous activity. SYMPTOMS Symptoms may occur immediately after asthma is triggered or many hours later. Symptoms include:  Wheezing.  Excessive nighttime or early morning coughing.  Frequent or severe coughing with a common cold.  Chest tightness.  Shortness of breath. DIAGNOSIS  The diagnosis of asthma is made by a review of your medical history and a physical exam. Tests may also be performed. These may include:  Lung function  studies. These tests show how much air you breathe in and out.  Allergy tests.  Imaging tests such as X-rays. TREATMENT  Asthma cannot be cured, but it can usually be controlled. Treatment involves identifying and avoiding your asthma triggers. It also involves medicines. There are 2 classes of medicine used for asthma treatment:   Controller medicines. These prevent asthma symptoms from occurring. They are usually taken every day.  Reliever or rescue medicines. These quickly relieve asthma symptoms. They are used as needed and provide short-term relief. Your health care provider will help you create an asthma action plan. An asthma action plan is a written plan for managing and treating your asthma attacks. It includes a list of your asthma triggers and how they may be avoided. It also includes information on when medicines should be taken and when their dosage should be changed. An action plan may also involve the use of a device called a peak flow meter. A peak flow meter measures how well the lungs are working. It helps you monitor your condition. HOME CARE INSTRUCTIONS   Take medicines only as directed by your health care provider. Speak with your health care provider if you have questions about how or when to take the medicines.  Use a peak flow meter as directed by your health care provider. Record and keep track of readings.  Understand and use the action plan to help minimize or stop an asthma attack without needing to seek medical care.  Control your home environment in the following ways to help prevent asthma attacks:  Do not smoke. Avoid being exposed to secondhand smoke.  Change your heating and air conditioning filter regularly.  Limit your use of fireplaces and wood stoves.  Get rid of pests (such as roaches and mice) and their droppings.  Throw away plants if you see mold on them.  Clean your floors and dust regularly. Use unscented cleaning products.  Try to have  someone else vacuum for you regularly. Stay out of rooms while they are being vacuumed and for a short while afterward. If you vacuum, use a dust mask from a hardware store, a double-layered or microfilter vacuum cleaner bag, or a vacuum cleaner with a HEPA filter.  Replace carpet with wood, tile, or vinyl flooring. Carpet can trap dander and dust.  Use allergy-proof pillows, mattress covers, and box spring covers.  Wash bed sheets and blankets every week in hot water and dry them in a dryer.  Use blankets that are made of polyester or cotton.  Clean bathrooms and kitchens with bleach. If possible, have someone repaint the walls in these rooms with mold-resistant paint. Keep out of the rooms that are being cleaned and painted.  Wash hands frequently. SEEK MEDICAL CARE IF:   You have wheezing, shortness of breath, or a cough even if taking medicine to prevent attacks.  The colored mucus you cough up (sputum) is thicker than usual.  Your sputum changes from clear or white to yellow, green, gray, or bloody.  You have any problems that may be related to the medicines you are taking (such as a rash, itching, swelling, or trouble breathing).  You are using a reliever medicine more than 2-3 times per week.  Your peak flow is still at 50-79% of your personal best after following your action plan for 1 hour.  You have a fever. SEEK IMMEDIATE MEDICAL CARE IF:   You seem to be getting worse and are unresponsive to treatment during an asthma attack.  You are short of breath even at rest.  You get short of breath when doing very little physical activity.  You have difficulty eating, drinking, or talking due to asthma symptoms.  You develop chest pain.  You develop a fast heartbeat.  You have a bluish color to your lips or fingernails.  You are light-headed, dizzy, or faint.  Your peak flow is less than 50% of your personal best.   This information is not intended to replace advice  given to you by your health care provider. Make sure you discuss any questions you have with your health care provider.   Document Released: 08/12/2005 Document Revised: 05/03/2015 Document Reviewed: 03/11/2013 Elsevier Interactive Patient Education [employer].

## 2015-09-21 NOTE — Progress Notes (Signed)
Patient: Susan Gilmore Female    DOB: [birth year]   50 y.o.   MRN: [id number] Visit Date: 09/21/2015  Today's Provider: [name], PA-C   Chief Complaint  Patient presents with  . Cough  . Depo-provera    injection   Subjective:    Cough This is a new problem. The current episode started 1 to 4 weeks ago (it has been a week). The problem has been gradually worsening. The problem occurs constantly. The cough is non-productive (Dry cough). Associated symptoms include shortness of breath and wheezing. Pertinent negatives include no chills, fever, headaches or postnasal drip. The symptoms are aggravated by cold air, lying down and stress. She has tried a beta-agonist inhaler, body position changes, ipratropium inhaler, prescription cough suppressant, rest and steroid inhaler (Hycodan) for the symptoms. The treatment provided no relief. Her past medical history is significant for asthma, bronchitis and environmental allergies. There is no history of bronchiectasis, COPD, emphysema or pneumonia.       Allergies  Allergen Reactions  . Effexor [Venlafaxine Hcl]     swelling  . Isosorbide     Bad headache   Previous Medications   ADVAIR DISKUS 250-50 MCG/DOSE AEPB    INHALE 1 PUFF BY MOUTH TWICE A DAY   ALBUTEROL (PROVENTIL HFA;VENTOLIN HFA) 108 (90 BASE) MCG/ACT INHALER    Inhale 2 puffs into the lungs every 6 (six) hours as needed.   ALPRAZOLAM (XANAX) 1 MG TABLET    Take 1 mg by mouth as needed.   AMPHETAMINE-DEXTROAMPHETAMINE (ADDERALL XR) 30 MG 24 HR CAPSULE    Take 60 mg by mouth daily.   B-D TB SYRINGE 1CC/27GX1/2" 27G X 1/2" 1 ML MISC    USE TO INJECT METHOTREXATE WEEKLY 30 DAYS   DULOXETINE (CYMBALTA) 60 MG CAPSULE    Take 60 mg by mouth daily.   ENBREL SURECLICK 50 MG/ML INJECTION       ESZOPICLONE 3 MG TABS       FEXOFENADINE (ALLEGRA) 180 MG TABLET    Take 180 mg by mouth daily.   FLUTICASONE (FLONASE) 50 MCG/ACT NASAL SPRAY    Place 2 sprays into both  nostrils daily.   FOLIC ACID (FOLVITE) 1 MG TABLET    Take 1 tablet by mouth 2 (two) times daily.   HYDROCHLOROTHIAZIDE (HYDRODIURIL) 12.5 MG TABLET    Take 1 tablet by mouth daily.   HYDROCODONE-ACETAMINOPHEN (NORCO) 7.5-325 MG TABLET       HYDROCODONE-HOMATROPINE (HYCODAN) 5-1.5 MG/5ML SYRUP    Take 5 mLs by mouth every 8 (eight) hours as needed for cough.   LORATADINE (CLARITIN) 10 MG TABLET    Take 1 tablet by mouth daily.   MEDROXYPROGESTERONE (DEPO-PROVERA) 150 MG/ML INJECTION    Inject 1 mL (150 mg total) into the muscle every 3 (three) months.   MELOXICAM (MOBIC) 15 MG TABLET    TAKE 1 TABLET BY MOUTH DAILY AS NEEDED FOR HAND PAIN (DO NOT TAKE ADVIL/IBU/ALEVE WITH THIS)   MENTHOL-METHYL SALICYLATE (MUSCLE RUB) 10-15 % CREA    Apply 1 application topically as needed.   METHOTREXATE 50 MG/2ML INJECTION    INJECT 1 ML ONCE WEEKLY AS DIRECTED   METHOTREXATE SODIUM (METHOTREXATE, PF,) 1 GM/40ML INJECTION    METHOTREXATE SODIUM (PF), 25MG /ML (Injection Solution) - Historical Medication  Prescribed by Endocrinologist (25 MG/ML) Active   MONTELUKAST (SINGULAIR) 10 MG TABLET    Take 1 tablet by mouth daily.   OMEPRAZOLE (PRILOSEC) 20 MG CAPSULE  Take 1 capsule (20 mg total) by mouth daily.   PREDNISONE (DELTASONE) 10 MG TABLET    Take 1 tablet (10 mg total) by mouth daily with breakfast. 6 tabs Daily for 4 days 5 tabs Daily for 4 days 3 tabs Daily for 3 days 2 tabs Daily for 3 days  Then back to normal prednisone dose.  Do not take your regular prednisone while taking this prescription.   PROPRANOLOL ER (INDERAL LA) 160 MG SR CAPSULE    TAKE ONE CAPSULE BY MOUTH EVERY DAY **STOP 120MG **    Review of Systems  Constitutional: Negative for fever and chills.  HENT: Positive for congestion (chest congestion). Negative for postnasal drip and sneezing.   Respiratory: Positive for cough, chest tightness, shortness of breath and wheezing.   Cardiovascular: Negative.   Gastrointestinal: Negative  for nausea, vomiting and abdominal pain.  Allergic/Immunologic: Positive for environmental allergies.  Neurological: Negative for dizziness and headaches.    Social History  Substance Use Topics  . Smoking status: Former Smoker -- 1.00 packs/day for 26 years    Types: Cigarettes    Quit date: 07/26/2013  . Smokeless tobacco: Never Used  . Alcohol Use: No     Comment: Had a history of alcohol use but currently is not drinking any   Objective:   BP 150/80 mmHg  Pulse 95  Temp(Src) 97.8 F (36.6 C) (Oral)  Resp 14  Wt 206 lb (93.441 kg)  SpO2 97%  Physical Exam  Constitutional: She appears well-developed and well-nourished. No distress.  HENT:  Head: Normocephalic and atraumatic.  Right Ear: Hearing, tympanic membrane, external ear and ear canal normal.  Left Ear: Hearing, tympanic membrane, external ear and ear canal normal.  Nose: Mucosal edema and rhinorrhea present. Right sinus exhibits maxillary sinus tenderness and frontal sinus tenderness. Left sinus exhibits maxillary sinus tenderness and frontal sinus tenderness.  Mouth/Throat: Uvula is midline, oropharynx is clear and moist and mucous membranes are normal. No oropharyngeal exudate, posterior oropharyngeal edema or posterior oropharyngeal erythema.  Eyes: Conjunctivae are normal. Pupils are equal, round, and reactive to light. Right eye exhibits no discharge. Left eye exhibits no discharge. No scleral icterus.  Neck: Normal range of motion. Neck supple. No tracheal deviation present. No thyromegaly present.  Cardiovascular: Normal rate, regular rhythm and normal heart sounds.  Exam reveals no gallop and no friction rub.   No murmur heard. Pulmonary/Chest: Effort normal. No accessory muscle usage or stridor. No respiratory distress. She has decreased breath sounds. She has wheezes (throughout). She has no rhonchi. She has no rales.  Lymphadenopathy:    She has no cervical adenopathy.  Skin: Skin is warm and dry. She is not  diaphoretic.  Vitals reviewed.       Assessment & Plan:     1. Encounter for surveillance of injectable contraceptive Depo-Provera was given without complication. - medroxyPROGESTERone (DEPO-PROVERA) injection 150 mg; Inject 1 mL (150 mg total) into the muscle once. - POCT urine pregnancy  2. Asthma, moderate persistent, with acute exacerbation Worsening wheezing and cough. We'll give DuoNeb treatment today in the office. We'll try to get a nebulizer for her to have at home as well as prescribe DuoNeb for her to use every 6 hours as needed for shortness of breath and wheezing. I will also refill her albuterol inhaler as she is now out. I will also give her Avelox as below for the acute exacerbation. She is to call the office if symptoms fail to improve or worsen. -  albuterol (PROVENTIL HFA;VENTOLIN HFA) 108 (90 Base) MCG/ACT inhaler; Inhale 2 puffs into the lungs every 6 (six) hours as needed.  Dispense: 1 Inhaler; Refill: 11 - DME Nebulizer machine - ipratropium-albuterol (DUONEB) 0.5-2.5 (3) MG/3ML nebulizer solution 3 mL; Take 3 mLs by nebulization once. - ipratropium-albuterol (DUONEB) 0.5-2.5 (3) MG/3ML SOLN; Take 3 mLs by nebulization every 6 (six) hours as needed.  Dispense: 360 mL; Refill: 6 - moxifloxacin (AVELOX) 400 MG tablet; Take 1 tablet (400 mg total) by mouth daily at 8 pm.  Dispense: 10 tablet; Refill: 0  3. Acute maxillary sinusitis, recurrence not specified We'll give Avelox as below due to worsening symptoms. She may use Mucinex DM for congestion. She needs to make sure to stay well-hydrated and get plenty of rest. She is to call the office if symptoms fail to improve or worsen. - moxifloxacin (AVELOX) 400 MG tablet; Take 1 tablet (400 mg total) by mouth daily at 8 pm.  Dispense: 10 tablet; Refill: 0       [name], PA-C  [facility]

## 2015-09-23 DIAGNOSIS — M5136 Other intervertebral disc degeneration, lumbar region: Secondary | ICD-10-CM | POA: Diagnosis not present

## 2015-10-02 ENCOUNTER — Telehealth: Payer: Self-pay | Admitting: Physician Assistant

## 2015-10-02 DIAGNOSIS — J4541 Moderate persistent asthma with (acute) exacerbation: Secondary | ICD-10-CM

## 2015-10-02 DIAGNOSIS — R05 Cough: Secondary | ICD-10-CM

## 2015-10-02 DIAGNOSIS — R059 Cough, unspecified: Secondary | ICD-10-CM

## 2015-10-02 MED ORDER — BUDESONIDE 0.25 MG/2ML IN SUSP: 0.2500 mg | Freq: Four times a day (QID) | RESPIRATORY_TRACT | Status: DC | PRN

## 2015-10-02 MED ORDER — HYDROCODONE-HOMATROPINE 5-1.5 MG/5ML PO SYRP: 5.0000 mL | ORAL_SOLUTION | Freq: Three times a day (TID) | ORAL | Status: DC | PRN

## 2015-10-02 NOTE — Telephone Encounter (Signed)
Pulmicort nebulizer sent in to [facility]. She is to use this instead of Duoneb to see if it offers any more benefit. Hycodan cough syrup Rx printed and placed up front for pick up.

## 2015-10-02 NOTE — Telephone Encounter (Signed)
Pt states she still has a cough and is requesting a refill for the Rx for the cough.  Pt also states the Rx that she puts in the machine is not helping.  Pt states her not breaking up and it hurts in her chest.  [facility].  [phone]

## 2015-10-02 NOTE — Telephone Encounter (Signed)
Please advise.  Thanks,  -Geanie Pacifico 

## 2015-10-03 NOTE — Telephone Encounter (Signed)
Lmtcb  Thanks,  -Lindzey Zent 

## 2015-10-03 NOTE — Telephone Encounter (Signed)
Patient advised as directed below. She wants [name] to know that the nebulizer machine is using is a borrow one. She still has not heard from any one regarding the neb machine.  Thanks,  -Shacoya Burkhammer

## 2015-10-04 NOTE — Telephone Encounter (Signed)
Will follow up on nebulizer.

## 2015-10-06 ENCOUNTER — Other Ambulatory Visit: Payer: Self-pay | Admitting: Physician Assistant

## 2015-10-06 ENCOUNTER — Telehealth: Payer: Self-pay

## 2015-10-06 DIAGNOSIS — J4541 Moderate persistent asthma with (acute) exacerbation: Secondary | ICD-10-CM

## 2015-10-06 MED ORDER — PREDNISONE 10 MG PO TABS: ORAL_TABLET | ORAL | Status: DC

## 2015-10-06 MED ORDER — LEVOFLOXACIN 500 MG PO TABS: 500.0000 mg | ORAL_TABLET | Freq: Every day | ORAL | Status: DC

## 2015-10-06 NOTE — Telephone Encounter (Signed)
Patient husband called that patient is worst with her cough and has no voice, chest tightness, no wheezing. Please Advise.  Thanks,  -Joseline

## 2015-10-06 NOTE — Telephone Encounter (Signed)
Patient advised as directed below. Patient is not taking the prednisone. She still has some but has not been taking it. Per [name] she will send also prednisone taper for her to pick up.  Thanks,  -Jereme Loren

## 2015-10-06 NOTE — Telephone Encounter (Signed)
Stop avelox (moxifloxacin) and start levaquin. Is she on prednisone still for her RA?

## 2015-10-12 ENCOUNTER — Telehealth: Payer: Self-pay | Admitting: Physician Assistant

## 2015-10-12 DIAGNOSIS — J4540 Moderate persistent asthma, uncomplicated: Secondary | ICD-10-CM

## 2015-10-12 MED ORDER — ALBUTEROL SULFATE HFA 108 (90 BASE) MCG/ACT IN AERS: 1.0000 | INHALATION_SPRAY | Freq: Four times a day (QID) | RESPIRATORY_TRACT | Status: DC | PRN

## 2015-10-12 NOTE — Telephone Encounter (Signed)
Please advise.  Thanks,  -Emmory Solivan 

## 2015-10-12 NOTE — Telephone Encounter (Signed)
Sent in proair instead to see if covered better.

## 2015-10-12 NOTE — Telephone Encounter (Signed)
Advised patient as directed below. Per Cherie it is for the Albuterol (proventil) inhaler and the Pulmicort that was not cover.  Thanks,  -Guilford Shannahan

## 2015-10-12 NOTE — Telephone Encounter (Signed)
Was it for her advair and albuterol inhalers? I am sure insurance coverage for the medication changed and they are going to want her to take something else.  That happens a lot with insurance companies.

## 2015-10-12 NOTE — Telephone Encounter (Signed)
Pt called saying the medications that were last sent into [facility] are way too expensive.  They are both for her asthma.   She could not pick them up.  She wants to if there is something else she can use.  She does not understand why it went from 0 copay to 60.00.  Please advise.  [phone]  Hoyt Koch

## 2015-10-16 ENCOUNTER — Telehealth: Payer: Self-pay | Admitting: Physician Assistant

## 2015-10-16 NOTE — Telephone Encounter (Signed)
Pt states Susan Gilmore was working getting her a nebulizer.  Pt states the pharmacy has not rec'd a Rx for that.  [id number]

## 2015-10-16 NOTE — Telephone Encounter (Signed)
Please advice.  Thanks,  -Darcel Zick

## 2015-10-16 NOTE — Telephone Encounter (Signed)
If it has not been received we can do a hand written Rx for it.  I dont think she can get this at her pharmacy (if she can then great) but she will most likely have to get this at [facility] or another medical supply store.

## 2015-10-16 NOTE — Telephone Encounter (Signed)
Patient advised as directed below. She will come to get the prescription.  Thanks,  -Nathanie Ottley

## 2015-10-30 ENCOUNTER — Ambulatory Visit (INDEPENDENT_AMBULATORY_CARE_PROVIDER_SITE_OTHER): Payer: Medicare Other | Admitting: Physician Assistant

## 2015-10-30 ENCOUNTER — Encounter: Payer: Self-pay | Admitting: Physician Assistant

## 2015-10-30 VITALS — BP 140/90 | HR 63 | Temp 98.4°F | Resp 16 | Wt 206.8 lb

## 2015-10-30 DIAGNOSIS — D848 Other specified immunodeficiencies: Secondary | ICD-10-CM

## 2015-10-30 DIAGNOSIS — H6503 Acute serous otitis media, bilateral: Secondary | ICD-10-CM | POA: Diagnosis not present

## 2015-10-30 DIAGNOSIS — B370 Candidal stomatitis: Secondary | ICD-10-CM

## 2015-10-30 DIAGNOSIS — T380X1A Poisoning by glucocorticoids and synthetic analogues, accidental (unintentional), initial encounter: Secondary | ICD-10-CM | POA: Diagnosis not present

## 2015-10-30 DIAGNOSIS — Z7952 Long term (current) use of systemic steroids: Secondary | ICD-10-CM

## 2015-10-30 DIAGNOSIS — T380X5A Adverse effect of glucocorticoids and synthetic analogues, initial encounter: Secondary | ICD-10-CM

## 2015-10-30 MED ORDER — CLARITHROMYCIN 500 MG PO TABS: 500.0000 mg | ORAL_TABLET | Freq: Two times a day (BID) | ORAL | Status: DC

## 2015-10-30 MED ORDER — NYSTATIN 100000 UNIT/ML MT SUSP: 5.0000 mL | Freq: Four times a day (QID) | OROMUCOSAL | Status: DC

## 2015-10-30 NOTE — Progress Notes (Signed)
Patient: Susan Gilmore Female    DOB: 05-15-1966   50 y.o.   MRN: [id number] Visit Date: 10/30/2015  Today's Provider: [name], PA-C   Chief Complaint  Patient presents with  . Otalgia   Subjective:    Otalgia  There is pain in the left ear. The current episode started today. The problem occurs constantly. There has been no fever (Maybe a low grade fever). Associated symptoms include coughing, headaches and a sore throat (scratchy throat). Pertinent negatives include no abdominal pain, ear discharge, hearing loss, neck pain, rhinorrhea or vomiting. She has tried nothing for the symptoms. The treatment provided no relief.       Allergies  Allergen Reactions  . Effexor [Venlafaxine Hcl]     swelling  . Isosorbide     Bad headache   Previous Medications   ADVAIR DISKUS 250-50 MCG/DOSE AEPB    INHALE 1 PUFF BY MOUTH TWICE A DAY   ALBUTEROL (PROAIR HFA) 108 (90 BASE) MCG/ACT INHALER    Inhale 1-2 puffs into the lungs every 6 (six) hours as needed for wheezing or shortness of breath.   ALPRAZOLAM (XANAX) 1 MG TABLET    Take 1 mg by mouth as needed.   AMPHETAMINE-DEXTROAMPHETAMINE (ADDERALL XR) 30 MG 24 HR CAPSULE    Take 60 mg by mouth daily.   B-D TB SYRINGE 1CC/27GX1/2" 27G X 1/2" 1 ML MISC    USE TO INJECT METHOTREXATE WEEKLY 30 DAYS   BUDESONIDE (PULMICORT) 0.25 MG/2ML NEBULIZER SOLUTION    Take 2 mLs (0.25 mg total) by nebulization every 6 (six) hours as needed.   DULOXETINE (CYMBALTA) 60 MG CAPSULE    Take 60 mg by mouth daily.   ENBREL SURECLICK 50 MG/ML INJECTION       ESZOPICLONE 3 MG TABS       FEXOFENADINE (ALLEGRA) 180 MG TABLET    Take 180 mg by mouth daily.   FLUTICASONE (FLONASE) 50 MCG/ACT NASAL SPRAY    Place 2 sprays into both nostrils daily.   FOLIC ACID (FOLVITE) 1 MG TABLET    Take 1 tablet by mouth 2 (two) times daily.   HYDROCHLOROTHIAZIDE (HYDRODIURIL) 12.5 MG TABLET    Take 1 tablet by mouth daily.   HYDROCODONE-ACETAMINOPHEN (NORCO)  7.5-325 MG TABLET       HYDROCODONE-HOMATROPINE (HYCODAN) 5-1.5 MG/5ML SYRUP    Take 5 mLs by mouth every 8 (eight) hours as needed for cough.   HYDROXYCHLOROQUINE (PLAQUENIL) 200 MG TABLET    TAKE 2 TABLETS BY MOUTH WITH FOOD OR MILK ONCE A DAY   IPRATROPIUM-ALBUTEROL (DUONEB) 0.5-2.5 (3) MG/3ML SOLN    TAKE 3 MLS BY NEBULIZATION EVERY 6 (SIX) HOURS AS NEEDED.   LEVOFLOXACIN (LEVAQUIN) 500 MG TABLET    Take 1 tablet (500 mg total) by mouth daily.   LORATADINE (CLARITIN) 10 MG TABLET    Take 1 tablet by mouth daily.   MEDROXYPROGESTERONE (DEPO-PROVERA) 150 MG/ML INJECTION    Inject 1 mL (150 mg total) into the muscle every 3 (three) months.   MELOXICAM (MOBIC) 15 MG TABLET    TAKE 1 TABLET BY MOUTH DAILY AS NEEDED FOR HAND PAIN (DO NOT TAKE ADVIL/IBU/ALEVE WITH THIS)   MENTHOL-METHYL SALICYLATE (MUSCLE RUB) 10-15 % CREA    Apply 1 application topically as needed.   METHOTREXATE 50 MG/2ML INJECTION    INJECT 1 ML ONCE WEEKLY AS DIRECTED   METHOTREXATE SODIUM (METHOTREXATE, PF,) 1 GM/40ML INJECTION    METHOTREXATE SODIUM (  PF), 25MG /ML (Injection Solution) - Historical Medication  Prescribed by Endocrinologist (25 MG/ML) Active   MONTELUKAST (SINGULAIR) 10 MG TABLET    Take 1 tablet by mouth daily.   OMEPRAZOLE (PRILOSEC) 20 MG CAPSULE    Take 1 capsule (20 mg total) by mouth daily.   PREDNISONE (DELTASONE) 10 MG TABLET    Take 6 tabs PO on day 1&2, 5 tabs PO on day 3&4, 4 tabs PO on day 5&6, 3 tabs PO on day 7&8, 2 tabs PO on day 9&10, 1 tab PO on day 11&12.   PROPRANOLOL ER (INDERAL LA) 160 MG SR CAPSULE    TAKE ONE CAPSULE BY MOUTH EVERY DAY **STOP 120MG **    Review of Systems  Constitutional: Negative for fever and chills.  HENT: Positive for congestion, ear pain, postnasal drip, sinus pressure and sore throat (scratchy throat). Negative for ear discharge, hearing loss, rhinorrhea and tinnitus.   Respiratory: Positive for cough. Negative for chest tightness, shortness of breath and wheezing.     Cardiovascular: Negative for chest pain.  Gastrointestinal: Negative for nausea, vomiting and abdominal pain.  Musculoskeletal: Negative for neck pain.  Neurological: Positive for headaches. Negative for dizziness.    Social History  Substance Use Topics  . Smoking status: Former Smoker -- 1.00 packs/day for 26 years    Types: Cigarettes    Quit date: 07/26/2013  . Smokeless tobacco: Never Used  . Alcohol Use: No     Comment: Had a history of alcohol use but currently is not drinking any   Objective:   BP 140/90 mmHg  Pulse 63  Temp(Src) 98.4 F (36.9 C) (Oral)  Resp 16  Wt 206 lb 12.8 oz (93.804 kg)  SpO2 97%  Physical Exam  Constitutional: She appears well-developed and well-nourished. No distress.  HENT:  Head: Normocephalic and atraumatic.  Right Ear: Hearing, external ear and ear canal normal. Right ear tenderness: clear. Tympanic membrane is erythematous and bulging. A middle ear effusion is present.  Left Ear: Hearing, external ear and ear canal normal. Tympanic membrane is erythematous and bulging. A middle ear effusion (clear) is present.  Nose: Mucosal edema and rhinorrhea present. Right sinus exhibits no maxillary sinus tenderness and no frontal sinus tenderness. Left sinus exhibits no maxillary sinus tenderness and no frontal sinus tenderness.  Mouth/Throat: Uvula is midline and mucous membranes are normal. She has dentures. Oral lesions (thrush noted of tongue) present. Posterior oropharyngeal erythema present. No oropharyngeal exudate or posterior oropharyngeal edema.  Eyes: Conjunctivae are normal. Pupils are equal, round, and reactive to light. Right eye exhibits no discharge. Left eye exhibits no discharge. No scleral icterus.  Neck: Normal range of motion. Neck supple. No tracheal deviation present. No thyromegaly present.  Cardiovascular: Normal rate, regular rhythm and normal heart sounds.  Exam reveals no gallop and no friction rub.   No murmur  heard. Pulmonary/Chest: Effort normal. No stridor. No respiratory distress. She has wheezes (mild wheezing throughout; much improved from previous). She has no rales.  Lymphadenopathy:    She has no cervical adenopathy.  Skin: Skin is warm and dry. She is not diaphoretic.  Vitals reviewed.       Assessment & Plan:     1. Bilateral acute serous otitis media, recurrence not specified Worsening symptoms and immunocompromised patient secondary to corticosteroid and immune modulator therapies for RA.  Will give clarithromycin as below for treatment of otitis media. She is to call if symptoms fail to improve or worsen. - clarithromycin (BIAXIN) 500 MG  tablet; Take 1 tablet (500 mg total) by mouth 2 (two) times daily.  Dispense: 14 tablet; Refill: 0  2. Thrush, oral Nystatin given for oral thrush secondary to inhaler and nebulizer use. - nystatin (MYCOSTATIN) 100000 UNIT/ML suspension; Take 5 mLs (500,000 Units total) by mouth 4 (four) times daily.  Dispense: 473 mL; Refill: 0  3. 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

## 2015-10-30 NOTE — Patient Instructions (Signed)
Serous Otitis Media Serous otitis media is fluid in the middle ear space. This space contains the bones for hearing and air. Air in the middle ear space helps to transmit sound.  The air gets there through the eustachian tube. This tube goes from the back of the nose (nasopharynx) to the middle ear space. It keeps the pressure in the middle ear the same as the outside world. It also helps to drain fluid from the middle ear space. CAUSES  Serous otitis media occurs when the eustachian tube gets blocked. Blockage can come from:  Ear infections.  Colds and other upper respiratory infections.  Allergies.  Irritants such as cigarette smoke.  Sudden changes in air pressure (such as descending in an airplane).  Enlarged adenoids.  A mass in the nasopharynx. During colds and upper respiratory infections, the middle ear space can become temporarily filled with fluid. This can happen after an ear infection also. Once the infection clears, the fluid will generally drain out of the ear through the eustachian tube. If it does not, then serous otitis media occurs. SIGNS AND SYMPTOMS   Hearing loss.  A feeling of fullness in the ear, without pain.  Young children may not show any symptoms but may show slight behavioral changes, such as agitation, ear pulling, or crying. DIAGNOSIS  Serous otitis media is diagnosed by an ear exam. Tests may be done to check on the movement of the eardrum. Hearing exams may also be done. TREATMENT  The fluid most often goes away without treatment. If allergy is the cause, allergy treatment may be helpful. Fluid that persists for several months may require minor surgery. A small tube is placed in the eardrum to:  Drain the fluid.  Restore the air in the middle ear space. In certain situations, antibiotic medicines are used to avoid surgery. Surgery may be done to remove enlarged adenoids (if this is the cause). HOME CARE INSTRUCTIONS   Keep children away from  tobacco smoke.  Keep all follow-up visits as directed by your health care provider. SEEK MEDICAL CARE IF:   Your hearing is not better in 3 months.  Your hearing is worse.  You have ear pain.  You have drainage from the ear.  You have dizziness.  You have serous otitis media only in one ear or have any bleeding from your nose (epistaxis).  You notice a lump on your neck. MAKE SURE YOU:  Understand these instructions.   Will watch your condition.   Will get help right away if you are not doing well or get worse.    This information is not intended to replace advice given to you by your health care provider. Make sure you discuss any questions you have with your health care provider.   Document Released: 11/02/2003 Document Revised: 09/02/2014 Document Reviewed: 03/09/2013 Elsevier Interactive Patient Education 2016 [employer]  

## 2015-11-07 ENCOUNTER — Other Ambulatory Visit: Payer: Self-pay

## 2015-11-07 ENCOUNTER — Telehealth: Payer: Self-pay | Admitting: Physician Assistant

## 2015-11-07 ENCOUNTER — Inpatient Hospital Stay
Admission: EM | Admit: 2015-11-07 | Discharge: 2015-11-09 | DRG: 190 | Disposition: A | Discharge status: Home / Self Care | Payer: Medicare Other | Attending: Internal Medicine | Admitting: Internal Medicine

## 2015-11-07 DIAGNOSIS — M069 Rheumatoid arthritis, unspecified: Secondary | ICD-10-CM | POA: Diagnosis present

## 2015-11-07 DIAGNOSIS — J9621 Acute and chronic respiratory failure with hypoxia: Secondary | ICD-10-CM | POA: Diagnosis present

## 2015-11-07 DIAGNOSIS — F319 Bipolar disorder, unspecified: Secondary | ICD-10-CM | POA: Diagnosis present

## 2015-11-07 DIAGNOSIS — A630 Anogenital (venereal) warts: Secondary | ICD-10-CM | POA: Diagnosis present

## 2015-11-07 DIAGNOSIS — J45909 Unspecified asthma, uncomplicated: Secondary | ICD-10-CM | POA: Diagnosis present

## 2015-11-07 DIAGNOSIS — F419 Anxiety disorder, unspecified: Secondary | ICD-10-CM | POA: Diagnosis present

## 2015-11-07 DIAGNOSIS — Z79891 Long term (current) use of opiate analgesic: Secondary | ICD-10-CM

## 2015-11-07 DIAGNOSIS — J9690 Respiratory failure, unspecified, unspecified whether with hypoxia or hypercapnia: Secondary | ICD-10-CM | POA: Diagnosis present

## 2015-11-07 DIAGNOSIS — Z79899 Other long term (current) drug therapy: Secondary | ICD-10-CM

## 2015-11-07 DIAGNOSIS — Z86010 Personal history of colonic polyps: Secondary | ICD-10-CM

## 2015-11-07 DIAGNOSIS — J441 Chronic obstructive pulmonary disease with (acute) exacerbation: Secondary | ICD-10-CM | POA: Diagnosis present

## 2015-11-07 DIAGNOSIS — K219 Gastro-esophageal reflux disease without esophagitis: Secondary | ICD-10-CM | POA: Diagnosis present

## 2015-11-07 DIAGNOSIS — E559 Vitamin D deficiency, unspecified: Secondary | ICD-10-CM | POA: Diagnosis present

## 2015-11-07 DIAGNOSIS — R05 Cough: Secondary | ICD-10-CM | POA: Diagnosis not present

## 2015-11-07 DIAGNOSIS — J440 Chronic obstructive pulmonary disease with acute lower respiratory infection: Secondary | ICD-10-CM | POA: Diagnosis not present

## 2015-11-07 DIAGNOSIS — R0602 Shortness of breath: Secondary | ICD-10-CM | POA: Diagnosis not present

## 2015-11-07 DIAGNOSIS — Z87891 Personal history of nicotine dependence: Secondary | ICD-10-CM

## 2015-11-07 DIAGNOSIS — I10 Essential (primary) hypertension: Secondary | ICD-10-CM | POA: Diagnosis present

## 2015-11-07 DIAGNOSIS — J209 Acute bronchitis, unspecified: Secondary | ICD-10-CM | POA: Diagnosis present

## 2015-11-07 DIAGNOSIS — Z888 Allergy status to other drugs, medicaments and biological substances status: Secondary | ICD-10-CM

## 2015-11-07 DIAGNOSIS — Z7951 Long term (current) use of inhaled steroids: Secondary | ICD-10-CM

## 2015-11-07 DIAGNOSIS — G5600 Carpal tunnel syndrome, unspecified upper limb: Secondary | ICD-10-CM | POA: Diagnosis present

## 2015-11-07 MED ORDER — IPRATROPIUM-ALBUTEROL 0.5-2.5 (3) MG/3ML IN SOLN
9.0000 mL | Freq: Once | RESPIRATORY_TRACT | Status: AC
  Administered 2015-11-07 – 2015-11-08 (×2): 9 mL via RESPIRATORY_TRACT

## 2015-11-07 MED ORDER — IPRATROPIUM-ALBUTEROL 0.5-2.5 (3) MG/3ML IN SOLN
RESPIRATORY_TRACT | Status: AC
  Administered 2015-11-08: 9 mL via RESPIRATORY_TRACT
  Filled 2015-11-07: qty 9

## 2015-11-07 MED ORDER — METHYLPREDNISOLONE SODIUM SUCC 125 MG IJ SOLR
125.0000 mg | Freq: Once | INTRAMUSCULAR | Status: AC
  Administered 2015-11-08: 125 mg via INTRAVENOUS
  Filled 2015-11-07: qty 2

## 2015-11-07 NOTE — ED Notes (Addendum)
Pt to triage with frequent coughing, diaphoretic; reports SHOB and nonprod cough for couple days, worse tonight; denies pain hx of same; pt taken immediately to room 10 and placed on card monitor

## 2015-11-07 NOTE — Telephone Encounter (Signed)
Pt would like to speak with [facility]. Pt stated she woke up this morning and had body aches. Pt stated she feels awful and was coughing on the phone. Pt didn't want to speak with a nurse or come into see anyone else. Pt was advised that [name] was out of the office this afternoon. Pt would like Jenni to call her tomorrow. Thanks TNP

## 2015-11-08 ENCOUNTER — Emergency Department: Payer: Medicare Other

## 2015-11-08 ENCOUNTER — Other Ambulatory Visit: Payer: Self-pay

## 2015-11-08 DIAGNOSIS — J45909 Unspecified asthma, uncomplicated: Secondary | ICD-10-CM | POA: Diagnosis present

## 2015-11-08 DIAGNOSIS — J440 Chronic obstructive pulmonary disease with acute lower respiratory infection: Secondary | ICD-10-CM | POA: Diagnosis present

## 2015-11-08 DIAGNOSIS — I10 Essential (primary) hypertension: Secondary | ICD-10-CM | POA: Diagnosis present

## 2015-11-08 DIAGNOSIS — G5600 Carpal tunnel syndrome, unspecified upper limb: Secondary | ICD-10-CM | POA: Diagnosis present

## 2015-11-08 DIAGNOSIS — F419 Anxiety disorder, unspecified: Secondary | ICD-10-CM | POA: Diagnosis present

## 2015-11-08 DIAGNOSIS — Z79891 Long term (current) use of opiate analgesic: Secondary | ICD-10-CM | POA: Diagnosis not present

## 2015-11-08 DIAGNOSIS — A630 Anogenital (venereal) warts: Secondary | ICD-10-CM | POA: Diagnosis present

## 2015-11-08 DIAGNOSIS — Z86010 Personal history of colonic polyps: Secondary | ICD-10-CM | POA: Diagnosis not present

## 2015-11-08 DIAGNOSIS — Z79899 Other long term (current) drug therapy: Secondary | ICD-10-CM | POA: Diagnosis not present

## 2015-11-08 DIAGNOSIS — J9621 Acute and chronic respiratory failure with hypoxia: Secondary | ICD-10-CM | POA: Diagnosis present

## 2015-11-08 DIAGNOSIS — Z87891 Personal history of nicotine dependence: Secondary | ICD-10-CM | POA: Diagnosis not present

## 2015-11-08 DIAGNOSIS — K219 Gastro-esophageal reflux disease without esophagitis: Secondary | ICD-10-CM | POA: Diagnosis present

## 2015-11-08 DIAGNOSIS — Z7951 Long term (current) use of inhaled steroids: Secondary | ICD-10-CM | POA: Diagnosis not present

## 2015-11-08 DIAGNOSIS — F319 Bipolar disorder, unspecified: Secondary | ICD-10-CM | POA: Diagnosis present

## 2015-11-08 DIAGNOSIS — J209 Acute bronchitis, unspecified: Secondary | ICD-10-CM | POA: Diagnosis present

## 2015-11-08 DIAGNOSIS — Z888 Allergy status to other drugs, medicaments and biological substances status: Secondary | ICD-10-CM | POA: Diagnosis not present

## 2015-11-08 DIAGNOSIS — M069 Rheumatoid arthritis, unspecified: Secondary | ICD-10-CM | POA: Diagnosis present

## 2015-11-08 DIAGNOSIS — J441 Chronic obstructive pulmonary disease with (acute) exacerbation: Secondary | ICD-10-CM | POA: Diagnosis present

## 2015-11-08 DIAGNOSIS — E559 Vitamin D deficiency, unspecified: Secondary | ICD-10-CM | POA: Diagnosis present

## 2015-11-08 DIAGNOSIS — J9690 Respiratory failure, unspecified, unspecified whether with hypoxia or hypercapnia: Secondary | ICD-10-CM | POA: Diagnosis present

## 2015-11-08 LAB — BASIC METABOLIC PANEL
ANION GAP: 9 (ref 5–15)
BUN: 9 mg/dL (ref 6–20)
CO2: 20 mmol/L — AB (ref 22–32)
CREATININE: 0.78 mg/dL (ref 0.44–1.00)
Calcium: 8.3 mg/dL — ABNORMAL LOW (ref 8.9–10.3)
Chloride: 104 mmol/L (ref 101–111)
GFR calc non Af Amer: >60 mL/min (ref >60)
GLUCOSE: 97 mg/dL (ref 65–99)
Potassium: 3.2 mmol/L — ABNORMAL LOW (ref 3.5–5.1)
SODIUM: 133 mmol/L — AB (ref 135–145)

## 2015-11-08 LAB — CBC
HEMATOCRIT: 36.2 % (ref 35.0–47.0)
Hemoglobin: 12.6 g/dL (ref 12.0–16.0)
MCH: 30.9 pg (ref 26.0–34.0)
MCHC: 34.8 g/dL (ref 32.0–36.0)
MCV: 88.8 fL (ref 80.0–100.0)
PLATELETS: 164 10*3/uL (ref 150–440)
RBC: 4.07 MIL/uL (ref 3.80–5.20)
RDW: 17.7 % — AB (ref 11.5–14.5)
WBC: 7.3 10*3/uL (ref 3.6–11.0)

## 2015-11-08 LAB — BLOOD GAS, VENOUS
ACID-BASE DEFICIT: 2.8 mmol/L — AB (ref 0.0–2.0)
BICARBONATE: 20.6 meq/L — AB (ref 21.0–28.0)
O2 SAT: 98.5 %
PATIENT TEMPERATURE: 37
pCO2, Ven: 31 mmHg — ABNORMAL LOW (ref 44.0–60.0)
pH, Ven: 7.43 (ref 7.320–7.430)
pO2, Ven: 113 mmHg — ABNORMAL HIGH (ref 31.0–45.0)

## 2015-11-08 LAB — TROPONIN I
Troponin I: <0.03 ng/mL (ref <0.031)
Troponin I: <0.03 ng/mL (ref <0.031)
Troponin I: <0.03 ng/mL (ref <0.031)
Troponin I: <0.03 ng/mL (ref <0.031)
Troponin I: <0.03 ng/mL (ref <0.031)

## 2015-11-08 LAB — TSH: TSH: 0.396 u[IU]/mL (ref 0.350–4.500)

## 2015-11-08 LAB — HEMOGLOBIN A1C: Hgb A1c MFr Bld: 5 % (ref 4.0–6.0)

## 2015-11-08 LAB — INFLUENZA PANEL BY PCR (TYPE A & B)
H1N1FLUPCR: NOT DETECTED
INFLAPCR: NEGATIVE
INFLBPCR: NEGATIVE

## 2015-11-08 MED ORDER — TIOTROPIUM BROMIDE MONOHYDRATE 18 MCG IN CAPS
18.0000 ug | ORAL_CAPSULE | Freq: Every day | RESPIRATORY_TRACT | Status: DC
  Administered 2015-11-08 – 2015-11-09 (×2): 18 ug via RESPIRATORY_TRACT
  Filled 2015-11-08 (×2): qty 5

## 2015-11-08 MED ORDER — HYDROXYCHLOROQUINE SULFATE 200 MG PO TABS
400.0000 mg | ORAL_TABLET | Freq: Every day | ORAL | Status: DC
  Administered 2015-11-08 – 2015-11-09 (×2): 400 mg via ORAL
  Filled 2015-11-08 (×2): qty 2

## 2015-11-08 MED ORDER — METHOTREXATE SODIUM CHEMO INJECTION 50 MG/2ML: 20.0000 mg | INTRAMUSCULAR | Status: DC

## 2015-11-08 MED ORDER — NYSTATIN 100000 UNIT/ML MT SUSP
5.0000 mL | Freq: Four times a day (QID) | OROMUCOSAL | Status: DC
  Administered 2015-11-08 – 2015-11-09 (×5): 500000 [IU] via ORAL
  Filled 2015-11-08 (×5): qty 5

## 2015-11-08 MED ORDER — ONDANSETRON HCL 4 MG PO TABS: 4.0000 mg | ORAL_TABLET | Freq: Four times a day (QID) | ORAL | Status: DC | PRN

## 2015-11-08 MED ORDER — OXYCODONE HCL 5 MG PO TABS
5.0000 mg | ORAL_TABLET | ORAL | Status: DC | PRN
  Administered 2015-11-08 (×2): 5 mg via ORAL
  Filled 2015-11-08 (×2): qty 1

## 2015-11-08 MED ORDER — ACETAMINOPHEN 325 MG PO TABS: 650.0000 mg | ORAL_TABLET | Freq: Four times a day (QID) | ORAL | Status: DC | PRN

## 2015-11-08 MED ORDER — ALPRAZOLAM 1 MG PO TABS
1.0000 mg | ORAL_TABLET | Freq: Four times a day (QID) | ORAL | Status: DC | PRN
  Administered 2015-11-08: 1 mg via ORAL
  Filled 2015-11-08: qty 1

## 2015-11-08 MED ORDER — PREDNISONE 10 MG PO TABS
5.0000 mg | ORAL_TABLET | Freq: Every day | ORAL | Status: DC
Start: 2015-11-13 — End: 2015-11-09

## 2015-11-08 MED ORDER — LORATADINE 10 MG PO TABS
10.0000 mg | ORAL_TABLET | Freq: Every day | ORAL | Status: DC
  Administered 2015-11-08 – 2015-11-09 (×2): 10 mg via ORAL
  Filled 2015-11-08 (×2): qty 1

## 2015-11-08 MED ORDER — HYDROCHLOROTHIAZIDE 12.5 MG PO CAPS
12.5000 mg | ORAL_CAPSULE | Freq: Every day | ORAL | Status: DC
  Administered 2015-11-08 – 2015-11-09 (×2): 12.5 mg via ORAL
  Filled 2015-11-08 (×2): qty 1

## 2015-11-08 MED ORDER — ALBUTEROL SULFATE (2.5 MG/3ML) 0.083% IN NEBU
INHALATION_SOLUTION | RESPIRATORY_TRACT | Status: AC
  Administered 2015-11-08: 2.5 mg via RESPIRATORY_TRACT
  Filled 2015-11-08: qty 3

## 2015-11-08 MED ORDER — ONDANSETRON HCL 4 MG/2ML IJ SOLN: 4.0000 mg | Freq: Four times a day (QID) | INTRAMUSCULAR | Status: DC | PRN

## 2015-11-08 MED ORDER — SODIUM CHLORIDE 0.9 % IV SOLN
INTRAVENOUS | Status: DC
  Administered 2015-11-08 (×3): via INTRAVENOUS

## 2015-11-08 MED ORDER — ALBUTEROL SULFATE (2.5 MG/3ML) 0.083% IN NEBU: 2.5000 mg | INHALATION_SOLUTION | RESPIRATORY_TRACT | Status: DC | PRN

## 2015-11-08 MED ORDER — MORPHINE SULFATE (PF) 2 MG/ML IV SOLN: 2.0000 mg | INTRAVENOUS | Status: DC | PRN

## 2015-11-08 MED ORDER — DOCUSATE SODIUM 100 MG PO CAPS
100.0000 mg | ORAL_CAPSULE | Freq: Two times a day (BID) | ORAL | Status: DC
  Administered 2015-11-08 – 2015-11-09 (×2): 100 mg via ORAL
  Filled 2015-11-08 (×3): qty 1

## 2015-11-08 MED ORDER — PREDNISONE 20 MG PO TABS: 20.0000 mg | ORAL_TABLET | Freq: Every day | ORAL | Status: DC

## 2015-11-08 MED ORDER — ACETAMINOPHEN 650 MG RE SUPP: 650.0000 mg | Freq: Four times a day (QID) | RECTAL | Status: DC | PRN

## 2015-11-08 MED ORDER — MEDROXYPROGESTERONE ACETATE 150 MG/ML IM SUSP: 150.0000 mg | INTRAMUSCULAR | Status: DC

## 2015-11-08 MED ORDER — PANTOPRAZOLE SODIUM 40 MG PO TBEC
40.0000 mg | DELAYED_RELEASE_TABLET | Freq: Every day | ORAL | Status: DC
  Administered 2015-11-08 – 2015-11-09 (×2): 40 mg via ORAL
  Filled 2015-11-08 (×2): qty 1

## 2015-11-08 MED ORDER — ALBUTEROL SULFATE (2.5 MG/3ML) 0.083% IN NEBU
2.5000 mg | INHALATION_SOLUTION | Freq: Once | RESPIRATORY_TRACT | Status: AC
  Administered 2015-11-08: 2.5 mg via RESPIRATORY_TRACT

## 2015-11-08 MED ORDER — FOLIC ACID 1 MG PO TABS
1.0000 mg | ORAL_TABLET | Freq: Two times a day (BID) | ORAL | Status: DC
  Administered 2015-11-08 – 2015-11-09 (×3): 1 mg via ORAL
  Filled 2015-11-08 (×3): qty 1

## 2015-11-08 MED ORDER — PREDNISONE 20 MG PO TABS
50.0000 mg | ORAL_TABLET | Freq: Every day | ORAL | Status: AC
  Administered 2015-11-08: 50 mg via ORAL
  Filled 2015-11-08: qty 2

## 2015-11-08 MED ORDER — MONTELUKAST SODIUM 10 MG PO TABS
10.0000 mg | ORAL_TABLET | Freq: Every day | ORAL | Status: DC
  Administered 2015-11-08 – 2015-11-09 (×2): 10 mg via ORAL
  Filled 2015-11-08 (×2): qty 1

## 2015-11-08 MED ORDER — POTASSIUM CHLORIDE CRYS ER 20 MEQ PO TBCR
40.0000 meq | EXTENDED_RELEASE_TABLET | Freq: Once | ORAL | Status: AC
  Administered 2015-11-08: 16:00:00 40 meq via ORAL
  Filled 2015-11-08: qty 2

## 2015-11-08 MED ORDER — GUAIFENESIN ER 600 MG PO TB12
600.0000 mg | ORAL_TABLET | Freq: Two times a day (BID) | ORAL | Status: DC
  Administered 2015-11-08 – 2015-11-09 (×3): 600 mg via ORAL
  Filled 2015-11-08 (×3): qty 1

## 2015-11-08 MED ORDER — PROPRANOLOL HCL ER 80 MG PO CP24
160.0000 mg | ORAL_CAPSULE | Freq: Every day | ORAL | Status: DC
  Administered 2015-11-08 – 2015-11-09 (×2): 160 mg via ORAL
  Filled 2015-11-08: qty 2
  Filled 2015-11-08: qty 1

## 2015-11-08 MED ORDER — AMPHETAMINE-DEXTROAMPHET ER 30 MG PO CP24
60.0000 mg | ORAL_CAPSULE | Freq: Every day | ORAL | Status: DC
Start: 2015-11-08 — End: 2015-11-09
  Administered 2015-11-08 – 2015-11-09 (×2): 60 mg via ORAL
  Filled 2015-11-08 (×2): qty 2

## 2015-11-08 MED ORDER — AZITHROMYCIN 250 MG PO TABS
500.0000 mg | ORAL_TABLET | Freq: Every day | ORAL | Status: AC
  Administered 2015-11-08: 10:00:00 500 mg via ORAL
  Filled 2015-11-08: qty 2

## 2015-11-08 MED ORDER — ENOXAPARIN SODIUM 40 MG/0.4ML ~~LOC~~ SOLN
40.0000 mg | SUBCUTANEOUS | Status: DC
  Administered 2015-11-08 – 2015-11-09 (×2): 40 mg via SUBCUTANEOUS
  Filled 2015-11-08 (×2): qty 0.4

## 2015-11-08 MED ORDER — SODIUM CHLORIDE 0.9% FLUSH: 3.0000 mL | Freq: Two times a day (BID) | INTRAVENOUS | Status: DC

## 2015-11-08 MED ORDER — DULOXETINE HCL 60 MG PO CPEP
60.0000 mg | ORAL_CAPSULE | Freq: Every day | ORAL | Status: DC
  Administered 2015-11-08 – 2015-11-09 (×2): 60 mg via ORAL
  Filled 2015-11-08 (×2): qty 1

## 2015-11-08 MED ORDER — HYDROCOD POLST-CPM POLST ER 10-8 MG/5ML PO SUER
ORAL | Status: AC
  Administered 2015-11-08: 5 mL
  Filled 2015-11-08: qty 5

## 2015-11-08 MED ORDER — PREDNISONE 10 MG PO TABS: 10.0000 mg | ORAL_TABLET | Freq: Every day | ORAL | Status: DC

## 2015-11-08 MED ORDER — MOMETASONE FURO-FORMOTEROL FUM 200-5 MCG/ACT IN AERO
2.0000 | INHALATION_SPRAY | Freq: Two times a day (BID) | RESPIRATORY_TRACT | Status: DC
  Administered 2015-11-08 – 2015-11-09 (×3): 2 via RESPIRATORY_TRACT
  Filled 2015-11-08: qty 8.8

## 2015-11-08 MED ORDER — OXYCODONE-ACETAMINOPHEN 5-325 MG PO TABS
1.0000 | ORAL_TABLET | ORAL | Status: DC | PRN
  Administered 2015-11-08: 1 via ORAL
  Filled 2015-11-08: qty 1

## 2015-11-08 MED ORDER — AZITHROMYCIN 250 MG PO TABS
250.0000 mg | ORAL_TABLET | Freq: Every day | ORAL | Status: DC
  Administered 2015-11-09: 250 mg via ORAL
  Filled 2015-11-08: qty 1

## 2015-11-08 MED ORDER — ETANERCEPT 50 MG/ML ~~LOC~~ SOAJ
50.0000 mg | SUBCUTANEOUS | Status: DC
  Filled 2015-11-08 (×2): qty 0.98

## 2015-11-08 MED ORDER — HYDROCODONE-HOMATROPINE 5-1.5 MG/5ML PO SYRP
5.0000 mL | ORAL_SOLUTION | Freq: Three times a day (TID) | ORAL | Status: DC | PRN
  Administered 2015-11-08 – 2015-11-09 (×3): 5 mL via ORAL
  Filled 2015-11-08 (×3): qty 5

## 2015-11-08 MED ORDER — ZOLPIDEM TARTRATE 5 MG PO TABS
5.0000 mg | ORAL_TABLET | Freq: Every evening | ORAL | Status: DC | PRN
  Administered 2015-11-08: 21:00:00 5 mg via ORAL
  Filled 2015-11-08: qty 1

## 2015-11-08 MED ORDER — PREDNISONE 20 MG PO TABS: 30.0000 mg | ORAL_TABLET | Freq: Every day | ORAL | Status: DC

## 2015-11-08 MED ORDER — PREDNISONE 20 MG PO TABS
40.0000 mg | ORAL_TABLET | Freq: Every day | ORAL | Status: AC
  Administered 2015-11-09: 40 mg via ORAL
  Filled 2015-11-08: qty 2

## 2015-11-08 MED ORDER — FLUTICASONE PROPIONATE 50 MCG/ACT NA SUSP
2.0000 | Freq: Every day | NASAL | Status: DC
  Administered 2015-11-08 – 2015-11-09 (×2): 2 via NASAL
  Filled 2015-11-08: qty 16

## 2015-11-08 MED ORDER — ALBUTEROL SULFATE HFA 108 (90 BASE) MCG/ACT IN AERS: 1.0000 | INHALATION_SPRAY | RESPIRATORY_TRACT | Status: DC

## 2015-11-08 MED ORDER — OXYCODONE-ACETAMINOPHEN 10-325 MG PO TABS: 1.0000 | ORAL_TABLET | ORAL | Status: DC | PRN

## 2015-11-08 NOTE — Progress Notes (Signed)
[facility]                                                                                                                                                                                            Patient Demographics   Susan Gilmore, is a 50 y.o. female, DOB - 08-30-1965, ZA:[id number]  Admit date - 11/07/2015   Admitting Physician Harrie Foreman, MD  Outpatient Primary MD for the patient is [name], PA-C   LOS - 0  Subjective: Patient c/o cough and sob , no cp breathing improved    Review of Systems:   CONSTITUTIONAL: No documented fever. No fatigue, weakness. No weight gain, no weight loss.  EYES: No blurry or double vision.  ENT: No tinnitus. No postnasal drip. No redness of the oropharynx.  RESPIRATORY: + cough, +  wheeze, no hemoptysis.+ dyspnea.  CARDIOVASCULAR: No chest pain. No orthopnea. No palpitations. No syncope.  GASTROINTESTINAL: No nausea, no vomiting or diarrhea. No abdominal pain. No melena or hematochezia.  GENITOURINARY: No dysuria or hematuria.  ENDOCRINE: No polyuria or nocturia. No heat or cold intolerance.  HEMATOLOGY: No anemia. No bruising. No bleeding.  INTEGUMENTARY: No rashes. No lesions.  MUSCULOSKELETAL: No arthritis. No swelling. No gout.  NEUROLOGIC: No numbness, tingling, or ataxia. No seizure-type activity.  PSYCHIATRIC: No anxiety. No insomnia. No ADD.    Vitals:   Filed Vitals:   11/08/15 0716 11/08/15 0738 11/08/15 0812 11/08/15 1156  BP: 168/93 156/83 122/63   Pulse: 75 85 78 119  Temp:   98.4 F (36.9 C)   TempSrc:   Oral   Resp: 18 26    Height:   5' (1.524 m)   Weight:   90.266 kg (199 lb)   SpO2: 91% 96% 94% 95%    Wt Readings from Last 3 Encounters:  11/08/15 90.266 kg (199 lb)  10/30/15 93.804 kg (206 lb 12.8 oz)  09/21/15 93.441 kg (206 lb)    No intake or output data in the 24 hours ending 11/08/15 1327  Physical Exam:   GENERAL: Pleasant-appearing  in no apparent distress.  HEAD, EYES, EARS, NOSE AND THROAT: Atraumatic, normocephalic. Extraocular muscles are intact. Pupils equal and reactive to light. Sclerae anicteric. No conjunctival injection. No oro-pharyngeal erythema.  NECK: Supple. There is no jugular venous distention. No bruits, no lymphadenopathy, no thyromegaly.  HEART: Regular rate and rhythm,. No murmurs, no rubs, no clicks.  LUNGS: b/l wheezing and no accesory muscle usage  ABDOMEN: Soft, flat, nontender, nondistended. Has good bowel sounds. No hepatosplenomegaly appreciated.  EXTREMITIES: No evidence of any cyanosis, clubbing,  or peripheral edema.  +2 pedal and radial pulses bilaterally.  NEUROLOGIC: The patient is alert, awake, and oriented x3 with no focal motor or sensory deficits appreciated bilaterally.  SKIN: Moist and warm with no rashes appreciated.  Psych: Not anxious, depressed LN: No inguinal LN enlargement    Antibiotics   Anti-infectives    Start     Dose/Rate Route Frequency Ordered Stop   11/09/15 1000  azithromycin (ZITHROMAX) tablet 250 mg     250 mg Oral Daily 11/08/15 0756 11/13/15 0959   11/08/15 1000  hydroxychloroquine (PLAQUENIL) tablet 400 mg     400 mg Oral Daily 11/08/15 0756     11/08/15 1000  azithromycin (ZITHROMAX) tablet 500 mg     500 mg Oral Daily 11/08/15 0756 11/08/15 0951      Medications   Scheduled Meds: . amphetamine-dextroamphetamine  60 mg Oral Daily  . [START ON 11/09/2015] azithromycin  250 mg Oral Daily  . docusate sodium  100 mg Oral BID  . DULoxetine  60 mg Oral Daily  . enoxaparin (LOVENOX) injection  40 mg Subcutaneous Q24H  . etanercept  50 mg Subcutaneous Weekly  . fluticasone  2 spray Each Nare Daily  . folic acid  1 mg Oral BID  . guaiFENesin  600 mg Oral BID  . hydrochlorothiazide  12.5 mg Oral Daily  . hydroxychloroquine  400 mg Oral Daily  . loratadine  10 mg Oral Daily  . [START ON 12/24/2015] medroxyPROGESTERone  150 mg Intramuscular Q90 days  .  [START ON 11/11/2015] methotrexate  20 mg Subcutaneous Weekly  . mometasone-formoterol  2 puff Inhalation BID  . montelukast  10 mg Oral Daily  . nystatin  5 mL Oral QID  . pantoprazole  40 mg Oral Daily  . potassium chloride  40 mEq Oral Once  . [START ON 11/09/2015] predniSONE  40 mg Oral Q breakfast   Followed by  . [START ON 11/10/2015] predniSONE  30 mg Oral Q breakfast   Followed by  . [START ON 11/11/2015] predniSONE  20 mg Oral Q breakfast   Followed by  . [START ON 11/12/2015] predniSONE  10 mg Oral Q breakfast   Followed by  . [START ON 11/13/2015] predniSONE  5 mg Oral Q breakfast  . propranolol ER  160 mg Oral Daily  . sodium chloride flush  3 mL Intravenous Q12H  . tiotropium  18 mcg Inhalation Daily   Continuous Infusions: . sodium chloride 125 mL/hr at 11/08/15 0805   PRN Meds:.acetaminophen **OR** acetaminophen, albuterol, ALPRAZolam, HYDROcodone-homatropine, morphine injection, ondansetron **OR** ondansetron (ZOFRAN) IV, oxyCODONE-acetaminophen **AND** oxyCODONE, zolpidem   Data Review:   Micro Results No results found for this or any previous visit (from the past 240 hour(s)).  Radiology Reports Dg Chest Port 1 View  11/08/2015  CLINICAL DATA:  Dyspnea EXAM: PORTABLE CHEST 1 VIEW COMPARISON:  02/25/2013 FINDINGS: Chronic bronchitic markings. Normal heart size and mediastinal contours. There is no edema, consolidation, effusion, or pneumothorax. IMPRESSION: Chronic bronchitic markings.  No evidence of acute disease. Electronically Signed   By: Monte Fantasia M.D.   On: 11/08/2015 00:45     CBC  Recent Labs Lab 11/07/15 2359  WBC 7.3  HGB 12.6  HCT 36.2  PLT 164  MCV 88.8  MCH 30.9  MCHC 34.8  RDW 17.7*    Chemistries   Recent Labs Lab 11/07/15 2359  NA 133*  K 3.2*  CL 104  CO2 20*  GLUCOSE 97  BUN 9  CREATININE  0.78  CALCIUM 8.3*    ------------------------------------------------------------------------------------------------------------------ estimated creatinine clearance is 86.1 mL/min (by C-G formula based on Cr of 0.78). ------------------------------------------------------------------------------------------------------------------ No results for input(s): HGBA1C in the last 72 hours. ------------------------------------------------------------------------------------------------------------------ No results for input(s): CHOL, HDL, LDLCALC, TRIG, CHOLHDL, LDLDIRECT in the last 72 hours. ------------------------------------------------------------------------------------------------------------------  Recent Labs  11/08/15 0904  TSH 0.396   ------------------------------------------------------------------------------------------------------------------ No results for input(s): VITAMINB12, FOLATE, FERRITIN, TIBC, IRON, RETICCTPCT in the last 72 hours.  Coagulation profile No results for input(s): INR, PROTIME in the last 168 hours.  No results for input(s): DDIMER in the last 72 hours.  Cardiac Enzymes  Recent Labs Lab 11/07/15 2359 11/08/15 0250 11/08/15 0904  TROPONINI <0.03 <0.03 <0.03   ------------------------------------------------------------------------------------------------------------------ Invalid input(s): POCBNP    Assessment & Plan    This is a 50 year old female with complicated medical history admitted respiratory failure secondary to COPD. 1. Respiratory failure: Acute on chronic; hypoxic improved continue nebs, steroids and abx continue o2,  seen by pulmonary add mucinex  2. EKG changes no cp, neg cez, monitor 3. COPD: cotinue predionose, nebs and spriva 4. Essential hypertension:  Improved continue hctz 5. Elevated rheumatoid factor: Continue methotrexate and Plaquenil. Enbrel scheduled per home regimen 6. Depression: Continue Cymbalta 7. DVT prophylaxis:  Lovenox 8. GI prophylaxis: Pantoprazole per home regimen      Code Status Orders        Start     Ordered   11/08/15 0757  Full code   Continuous     11/08/15 0756    Code Status History    Date Active Date Inactive Code Status Order ID Comments User Context   This patient has a current code status but no historical code status.           Consults  Pulmonary   DVT Prophylaxis  lovenx  Lab Results  Component Value Date   PLT 164 11/07/2015     Time Spent in minutes   14min  Greater than 50% of time spent in care coordination and counseling patient regarding the condition and plan of care.   Dustin Flock M.D on 11/08/2015 at 1:27 PM  Between 7am to 6pm - Pager - [phone]  After 6pm go to [email or web address] - password [email or web address] [facility]   Office  [phone]

## 2015-11-08 NOTE — ED Provider Notes (Signed)
[facility] Emergency Department Provider Note  ____________________________________________  Time seen: 12:00 AM  I have reviewed the triage vital signs and the nursing notes.   HISTORY  Chief Complaint Respiratory Distress      HPI Susan Gilmore is a 50 y.o. female presents with cough diaphoresis increasing shortness of breath 2 days with acute worsening tonight. Patient states that she has history of COPD. Patient denies any chest pain at this time     Past Medical History  Diagnosis Date  . Chest pain   . Hyperplastic colon polyp   . Allergic rhinitis   . Palpitations   . HPV (human papilloma virus) anogenital infection   . Contraception   . Cough   . Abnormal stress test   . Carpal tunnel syndrome   . Chronic anxiety   . Insomnia   . Pneumonia   . Bipolar disorder ([place])   . HTN (hypertension)   . Abnormal Pap smear 04/28/2012    normal pap /positive hpv     Patient Active Problem List   Diagnosis Date Noted  . Asthma 06/05/2015  . Rheumatoid arthritis ([place]) 06/05/2015  . 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 Allergic rhinitis 03/15/2015  . ADD (attention deficit disorder) 12/29/2014  . 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 Back ache 12/29/2014  . Bipolar disorder ([place]) 12/29/2014  . Carpal tunnel syndrome 12/29/2014  . Chest pain 12/29/2014  . Chronic anxiety 12/29/2014  . Elevated blood sugar 12/29/2014  . Elevated rheumatoid factor 12/29/2014  . Acid reflux 12/29/2014  . Infectious human wart virus 12/29/2014  . Benign neoplasm of colon 12/29/2014  . Cephalalgia 12/29/2014  . Cannot sleep 12/29/2014  . Awareness of heartbeats 12/29/2014  . Flutter-fibrillation 12/29/2014  . LAD (lymphadenopathy), retroperitoneal 12/29/2014  . Fast heart beat 12/29/2014  . Systolic dysfunction [id number]  . Avitaminosis D 12/29/2014  . Paroxysmal digital cyanosis 02/17/2014  . Ventricular bigeminy 01/14/2012   . Hypertension 01/14/2012  . Abnormal EKG 01/14/2012  . Tobacco abuse 01/14/2012  . Benign essential HTN 12/09/2003    Past Surgical History  Procedure Laterality Date  . Ganglion cyst excision    . Wisdom tooth extraction    . Full mouth dental      Current Outpatient Rx  Name  Route  Sig  Dispense  Refill  . ADVAIR DISKUS 250-50 MCG/DOSE AEPB      INHALE 1 PUFF BY MOUTH TWICE A DAY   60 each   6   . albuterol (PROAIR HFA) 108 (90 Base) MCG/ACT inhaler   Inhalation   Inhale 1-2 puffs into the lungs every 6 (six) hours as needed for wheezing or shortness of breath.   18 g   11   . ALPRAZolam (XANAX) 1 MG tablet   Oral   Take 1 mg by mouth as needed.         [name] amphetamine-dextroamphetamine (ADDERALL XR) 30 MG 24 hr capsule   Oral   Take 60 mg by mouth daily.      0   . B-D TB SYRINGE 1CC/27GX1/2" 27G X 1/2" 1 ML MISC      USE TO INJECT METHOTREXATE WEEKLY 30 DAYS      3     Dispense as written.   . budesonide (PULMICORT) 0.25 MG/2ML nebulizer solution   Nebulization   Take 2 mLs (0.25 mg total) by nebulization every 6 (six) hours as needed.   60 mL   12   .  clarithromycin (BIAXIN) 500 MG tablet   Oral   Take 1 tablet (500 mg total) by mouth 2 (two) times daily.   14 tablet   0   . DULoxetine (CYMBALTA) 60 MG capsule   Oral   Take 60 mg by mouth daily.         Scarlette Shorts SURECLICK 50 MG/ML injection                 Dispense as written.   . Eszopiclone 3 MG TABS               . fexofenadine (ALLEGRA) 180 MG tablet   Oral   Take 180 mg by mouth daily.         . fluticasone (FLONASE) 50 MCG/ACT nasal spray   Each Nare   Place 2 sprays into both nostrils daily.   16 g   11   . folic acid (FOLVITE) 1 MG tablet   Oral   Take 1 tablet by mouth 2 (two) times daily.         . hydrochlorothiazide (HYDRODIURIL) 12.5 MG tablet   Oral   Take 1 tablet by mouth daily.         [name] HYDROcodone-acetaminophen (NORCO) 7.5-325 MG tablet                . HYDROcodone-homatropine (HYCODAN) 5-1.5 MG/5ML syrup   Oral   Take 5 mLs by mouth every 8 (eight) hours as needed for cough. Patient not taking: Reported on 10/30/2015   240 mL   0   . hydroxychloroquine (PLAQUENIL) 200 MG tablet      TAKE 2 TABLETS BY MOUTH WITH FOOD OR MILK ONCE A DAY      2   . ipratropium-albuterol (DUONEB) 0.5-2.5 (3) MG/3ML SOLN      TAKE 3 MLS BY NEBULIZATION EVERY 6 (SIX) HOURS AS NEEDED.      6   . loratadine (CLARITIN) 10 MG tablet   Oral   Take 1 tablet by mouth daily.         . medroxyPROGESTERone (DEPO-PROVERA) 150 MG/ML injection   Intramuscular   Inject 1 mL (150 mg total) into the muscle every 3 (three) months.   1 mL   4   . meloxicam (MOBIC) 15 MG tablet      TAKE 1 TABLET BY MOUTH DAILY AS NEEDED FOR HAND PAIN (DO NOT TAKE ADVIL/IBU/ALEVE WITH THIS)   30 tablet   4   . Menthol-Methyl Salicylate (MUSCLE RUB) 10-15 % CREA   Topical   Apply 1 application topically as needed.         . methotrexate 50 MG/2ML injection      INJECT 1 ML ONCE WEEKLY AS DIRECTED      2   . Methotrexate Sodium (METHOTREXATE, PF,) 1 GM/40ML injection      METHOTREXATE SODIUM (PF), 25MG /ML (Injection Solution) - Historical Medication  Prescribed by Endocrinologist (25 MG/ML) Active         . montelukast (SINGULAIR) 10 MG tablet   Oral   Take 1 tablet by mouth daily.         [name] nystatin (MYCOSTATIN) 100000 UNIT/ML suspension   Oral   Take 5 mLs (500,000 Units total) by mouth 4 (four) times daily.   473 mL   0   . omeprazole (PRILOSEC) 20 MG capsule   Oral   Take 1 capsule (20 mg total) by mouth daily.   30 capsule   11   .  propranolol ER (INDERAL LA) 160 MG SR capsule      TAKE ONE CAPSULE BY MOUTH EVERY DAY **STOP 120MG **   30 capsule   6     Allergies Effexor and Isosorbide  Family History  Problem Relation Age of Onset  . Stroke Paternal Grandmother   . Heart disease Paternal Grandmother   . Diabetes  Maternal Grandmother   . Heart disease Maternal Grandmother   . Hypertension Maternal Grandmother   . Heart disease Maternal Grandfather   . Hypertension Maternal Grandfather   . Heart disease Father   . Stroke Father   . Hypertension Father   . Hyperlipidemia Father   . Diabetes Mother   . Heart attack Mother     Social History Social History  Substance Use Topics  . Smoking status: Former Smoker -- 1.00 packs/day for 26 years    Types: Cigarettes    Quit date: 07/26/2013  . Smokeless tobacco: Never Used  . Alcohol Use: No     Comment: Had a history of alcohol use but currently is not drinking any    Review of Systems  Constitutional: Negative for fever. Eyes: Negative for visual changes. ENT: Negative for sore throat. Cardiovascular: Negative for chest pain. Respiratory: Positive for cough and shortness of breath  Gastrointestinal: Negative for abdominal pain, vomiting and diarrhea. Genitourinary: Negative for dysuria. Musculoskeletal: Negative for back pain. Skin: Negative for rash. Neurological: Negative for headaches, focal weakness or numbness.   10-point ROS otherwise negative.  ____________________________________________   PHYSICAL EXAM:  VITAL SIGNS: ED Triage Vitals  Enc Vitals Group     BP 11/07/15 2335 155/109 mmHg     Pulse Rate 11/07/15 2335 120     Resp 11/07/15 2335 32     Temp 11/07/15 2335 98.9 F (37.2 C)     Temp Source 11/07/15 2335 Oral     SpO2 11/07/15 2335 100 %     Weight 11/07/15 2335 201 lb (91.173 kg)     Height 11/07/15 2335 5' (1.524 m)     Head Cir --      Peak Flow --      Pain Score --      Pain Loc --      Pain Edu? --      Excl. in [place]? --      Constitutional: Alert and oriented. Apparent respiratory distress positive diaphoresis Eyes: Conjunctivae are normal. PERRL. Normal extraocular movements. ENT   Head: Normocephalic and atraumatic.   Nose: No congestion/rhinnorhea.   Mouth/Throat: Mucous  membranes are moist.   Neck: No stridor. Hematological/Lymphatic/Immunilogical: No cervical lymphadenopathy. Cardiovascular: Normal rate, regular rhythm. Normal and symmetric distal pulses are present in all extremities. No murmurs, rubs, or gallops. Respiratory: Tachypnea, accessory muscle use, diffuse rhonchi Gastrointestinal: Soft and nontender. No distention. There is no CVA tenderness. Genitourinary: deferred Musculoskeletal: Nontender with normal range of motion in all extremities. No joint effusions.  No lower extremity tenderness nor edema. Neurologic:  Normal speech and language. No gross focal neurologic deficits are appreciated. Speech is normal.  Skin:  Skin is warm, dry and intact. No rash noted. Psychiatric: Mood and affect are normal. Speech and behavior are normal. Patient exhibits appropriate insight and judgment.  ____________________________________________    LABS (pertinent positives/negatives)  Labs Reviewed  BASIC METABOLIC PANEL - Abnormal; Notable for the following:    Sodium 133 (*)    Potassium 3.2 (*)    CO2 20 (*)    Calcium 8.3 (*)  All other components within normal limits  CBC - Abnormal; Notable for the following:    RDW 17.7 (*)    All other components within normal limits  BLOOD GAS, VENOUS - Abnormal; Notable for the following:    pCO2, Ven 31 (*)    pO2, Ven 113.0 (*)    Bicarbonate 20.6 (*)    Acid-base deficit 2.8 (*)    All other components within normal limits  TROPONIN I  TROPONIN I      RADIOLOGY     DG Chest Port 1 View (Final result) Result time: 11/08/15 00:45:38   Final result by Rad Results In Interface (11/08/15 00:45:38)   Narrative:   CLINICAL DATA: Dyspnea  EXAM: PORTABLE CHEST 1 VIEW  COMPARISON: 02/25/2013  FINDINGS: Chronic bronchitic markings. Normal heart size and mediastinal contours. There is no edema, consolidation, effusion, or pneumothorax.  IMPRESSION: Chronic bronchitic markings. No  evidence of acute disease.   Electronically Signed By: Monte Fantasia M.D. On: 11/08/2015 00:45   ED ECG REPORT I, [place] Silvie Obremski, the attending physician, personally viewed and interpreted this ECG.   Date: 11/08/2015  EKG Time: 12:04 AM  Rate: 98  Rhythm: Normal sinus rhythm with inferior lateral ST segment depression T wave inversion  Axis: None  Intervals: Normal  ST&T Change: None     INITIAL IMPRESSION / ASSESSMENT AND PLAN / ED COURSE  Pertinent labs & imaging results that were available during my care of the patient were reviewed by me and considered in my medical decision making (see chart for details).  Patient received 3 DuoNeb's and IV Solu-Medrol 125 mg with minimal improvement in symptoms. As such patient received additional albuterol treatments. History of physical exam consistent with acute COPD exacerbation. Given persistent such symptoms despite appropriate intervention patient discussed with Dr. Hedda Slade admission for further evaluation and management  ____________________________________________   FINAL CLINICAL IMPRESSION(S) / ED DIAGNOSES  Final diagnoses:  COPD exacerbation ([place])      Gregor Hams, MD 11/08/15 [phone]

## 2015-11-08 NOTE — Consult Note (Signed)
Pulmonary Critical Care  Initial Consult Note   Susan Gilmore [id number] DOB: 06/22/1966 DOA: 11/07/2015  Referring physician: Rosilyn Mings, MD PCP: [name], PA-C   Chief Complaint: Shortness of breath  HPI: Susan Gilmore is a 50 y.o. female with prior history of COPD RA presented with increased shortness of breath. Patient states that she has been having an increased cough noted. She has been having some sputum production. NO current chest pain noted. She has had wheezing noted. She has no fever noted. She has had no improvement with her routine inhalers. She states that she now feels much better compared to when she came in. She states that she is not sure how severe her COPD is and has not been seeing any pulmonologist presently.   Review of Systems:  Constitutional:  No weight loss, night sweats, Fevers, chills, fatigue.  HEENT:  No headaches, nasal congestion, post nasal drip,  Cardio-vascular:  No chest pain, Orthopnea, PND, swelling in lower extremities, anasarca, dizziness, palpitations  GI:  No heartburn, indigestion, abdominal pain, nausea, vomiting, diarrhea  Resp:  +shortness of breath +productive cough, No coughing up of blood. +wheezing Skin:  no rash or lesions.  Musculoskeletal:  No joint pain or swelling.   Remainder ROS performed and is unremarkable other than noted in HPI  Past Medical History  Diagnosis Date  . Chest pain   . Hyperplastic colon polyp   . Allergic rhinitis   . Palpitations   . HPV (human papilloma virus) anogenital infection   . Contraception   . Cough   . Abnormal stress test   . Carpal tunnel syndrome   . Chronic anxiety   . Insomnia   . Pneumonia   . Bipolar disorder ([place])   . HTN (hypertension)   . Abnormal Pap smear 04/28/2012    normal pap /positive hpv    Past Surgical History  Procedure Laterality Date  . Ganglion cyst excision    . Wisdom tooth extraction    . Full mouth dental     Social History:   reports that she quit smoking about 2 years ago. Her smoking use included Cigarettes. She has a 26 pack-year smoking history. She has never used smokeless tobacco. She reports that she does not drink alcohol or use illicit drugs.  Allergies  Allergen Reactions  . Effexor [Venlafaxine Hcl] Swelling  . Isosorbide Other (See Comments)    Reaction: Bad headache    Family History  Problem Relation Age of Onset  . Stroke Paternal Grandmother   . Heart disease Paternal Grandmother   . Diabetes Maternal Grandmother   . Heart disease Maternal Grandmother   . Hypertension Maternal Grandmother   . Heart disease Maternal Grandfather   . Hypertension Maternal Grandfather   . Heart disease Father   . Stroke Father   . Hypertension Father   . Hyperlipidemia Father   . Diabetes Mother   . Heart attack Mother     Prior to Admission medications   Medication Sig Start Date End Date Taking? Authorizing Provider  albuterol (PROAIR HFA) 108 (90 Base) MCG/ACT inhaler Inhale 1-2 puffs into the lungs every 6 (six) hours as needed for wheezing or shortness of breath. 10/12/15  Yes Clearnce Sorrel Burnette, PA-C  ALPRAZolam [name]) 1 MG tablet Take 1 mg by mouth 4 (four) times daily as needed for anxiety.    Yes Historical Provider, MD  amphetamine-dextroamphetamine (ADDERALL XR) 30 MG 24 hr capsule Take 60 mg by mouth daily. 05/05/15  Yes Historical Provider, MD  B-D TB SYRINGE 1CC/27GX1/2" 27G X 1/2" 1 ML MISC USE TO INJECT METHOTREXATE WEEKLY 30 DAYS 04/20/15  Yes Historical Provider, MD  budesonide (PULMICORT) 0.25 MG/2ML nebulizer solution Take 2 mLs (0.25 mg total) by nebulization every 6 (six) hours as needed. Patient taking differently: Take 0.25 mg by nebulization every 6 (six) hours as needed (for shortness of breath).  10/02/15  Yes Clearnce Sorrel Burnette, PA-C  DULoxetine (CYMBALTA) 60 MG capsule Take 60 mg by mouth daily.   Yes Historical Provider, MD  ENBREL SURECLICK 50 MG/ML injection Inject 50 mg as  directed once a week.  09/20/15  Yes Historical Provider, MD  Eszopiclone 3 MG TABS Take 3 mg by mouth at bedtime as needed (sleep).  03/16/15  Yes Historical Provider, MD  fexofenadine (ALLEGRA) 180 MG tablet Take 180 mg by mouth daily.   Yes Historical Provider, MD  fluticasone (FLONASE) 50 MCG/ACT nasal spray Place 2 sprays into both nostrils daily. 03/15/15  Yes Clearnce Sorrel Burnette, PA-C  Fluticasone-Salmeterol (ADVAIR) 250-50 MCG/DOSE AEPB Inhale 1 puff into the lungs 2 (two) times daily.   Yes Historical Provider, MD  folic acid (FOLVITE) 1 MG tablet Take 1 tablet by mouth 2 (two) times daily.   Yes Historical Provider, MD  hydrochlorothiazide (HYDRODIURIL) 12.5 MG tablet Take 1 tablet by mouth daily. 10/17/14  Yes Historical Provider, MD  HYDROcodone-homatropine (HYCODAN) 5-1.5 MG/5ML syrup Take 5 mLs by mouth every 8 (eight) hours as needed for cough. 10/02/15  Yes Clearnce Sorrel Burnette, PA-C  hydroxychloroquine (PLAQUENIL) 200 MG tablet TAKE 2 TABLETS BY MOUTH WITH FOOD OR MILK ONCE A DAY 10/16/15  Yes Historical Provider, MD  ipratropium-albuterol (DUONEB) 0.5-2.5 (3) MG/3ML SOLN Take 3 mLs by nebulization every 6 (six) hours as needed (shortness of breath).   Yes Historical Provider, MD  loratadine (CLARITIN) 10 MG tablet Take 1 tablet by mouth daily. 12/19/14  Yes Historical Provider, MD  medroxyPROGESTERone (DEPO-PROVERA) 150 MG/ML injection Inject 1 mL (150 mg total) into the muscle every 3 (three) months. 09/20/15  Yes Clearnce Sorrel Burnette, PA-C  meloxicam (MOBIC) 15 MG tablet Take 15 mg by mouth daily as needed for pain.   Yes Historical Provider, MD  Menthol-Methyl Salicylate (MUSCLE RUB) 10-15 % CREA Apply 1 application topically as needed for muscle pain.    Yes Historical Provider, MD  methotrexate 50 MG/2ML injection Inject 20 mg into the skin once a week.    Yes Historical Provider, MD  montelukast (SINGULAIR) 10 MG tablet Take 1 tablet by mouth daily. 12/19/14  Yes Historical Provider, MD   nystatin (MYCOSTATIN) 100000 UNIT/ML suspension Take 5 mLs (500,000 Units total) by mouth 4 (four) times daily. 10/30/15  Yes Clearnce Sorrel Burnette, PA-C  omeprazole (PRILOSEC) 20 MG capsule Take 1 capsule (20 mg total) by mouth daily. 03/21/15  Yes [name], PA-C  oxyCODONE-acetaminophen (PERCOCET) 10-325 MG tablet Take 1 tablet by mouth daily as needed for pain.  10/12/15  Yes Historical Provider, MD  propranolol ER (INDERAL LA) 160 MG SR capsule Take 160 mg by mouth daily.   Yes Historical Provider, MD   Physical Exam: Filed Vitals:   11/08/15 0700 11/08/15 0716 11/08/15 0738 11/08/15 0812  BP: 168/93 168/93 156/83 122/63  Pulse: 83 75 85 78  Temp:    98.4 F (36.9 C)  TempSrc:    Oral  Resp: 21 18 26    Height:    5' (1.524 m)  Weight:    90.266 kg (  199 lb)  SpO2: 92% 91% 96% 94%    Wt Readings from Last 3 Encounters:  11/08/15 90.266 kg (199 lb)  10/30/15 93.804 kg (206 lb 12.8 oz)  09/21/15 93.441 kg (206 lb)    General:  Appears calm and comfortable Eyes: PERRL, normal lids, irises & conjunctiva ENT: grossly normal hearing, lips & tongue Neck: no LAD, masses or thyromegaly Cardiovascular: RRR, no m/r/g. No LE edema. Respiratory: diminished but no rhonchi  Normal respiratory effort. Abdomen: soft, nontender Skin: no rash or induration seen on limited exam Musculoskeletal: grossly normal tone BUE/BLE Psychiatric: grossly normal mood and affect Neurologic: grossly non-focal.          Labs on Admission:  Basic Metabolic Panel:  Recent Labs Lab 11/07/15 2359  NA 133*  K 3.2*  CL 104  CO2 20*  GLUCOSE 97  BUN 9  CREATININE 0.78  CALCIUM 8.3*   Liver Function Tests: No results for input(s): AST, ALT, ALKPHOS, BILITOT, PROT, ALBUMIN in the last 168 hours. No results for input(s): LIPASE, AMYLASE in the last 168 hours. No results for input(s): AMMONIA in the last 168 hours. CBC:  Recent Labs Lab 11/07/15 2359  WBC 7.3  HGB 12.6  HCT 36.2  MCV 88.8   PLT 164   Cardiac Enzymes:  Recent Labs Lab 11/07/15 2359 11/08/15 0250 11/08/15 0904  TROPONINI <0.03 <0.03 <0.03    BNP (last 3 results) No results for input(s): BNP in the last 8760 hours.  ProBNP (last 3 results) No results for input(s): PROBNP in the last 8760 hours.  CBG: No results for input(s): GLUCAP in the last 168 hours.  Radiological Exams on Admission: Dg Chest Port 1 View  11/08/2015  CLINICAL DATA:  Dyspnea EXAM: PORTABLE CHEST 1 VIEW COMPARISON:  02/25/2013 FINDINGS: Chronic bronchitic markings. Normal heart size and mediastinal contours. There is no edema, consolidation, effusion, or pneumothorax. IMPRESSION: Chronic bronchitic markings.  No evidence of acute disease. Electronically Signed   By: Monte Fantasia M.D.   On: 11/08/2015 00:45    EKG: Independently reviewed.  Assessment/Plan Active Problems:   Respiratory failure ([place])   1. Acute on Chronic respiratory failure with hypoxia -agree with current management -continue with steroids -continue with abx as you are -monitor ABG as needed -she will need outpatient follow up in the office  2. COPD with exacerbation -she has already stopped smoking -would continue with inhalers -slow taper of steroids  3. Rheumatoid Arthritis -per prior management   Time spent: 81min    I have personally obtained a history, examined the patient, evaluated laboratory and imaging results, formulated the assessment and plan and placed orders.  The Patient requires high complexity decision making for assessment and support.   Patient will need follow up in the office after discharge  Allyne Gee, MD [facility] Pulmonary Critical Care Medicine Sleep Medicine

## 2015-11-08 NOTE — Progress Notes (Signed)
Initial Nutrition Assessment   INTERVENTION:   Meals and Snacks: Cater to patient preferences on Heart healthy diet order   NUTRITION DIAGNOSIS:   No nutrition diagnosis at this time  GOAL:   Patient will meet greater than or equal to 90% of their needs  MONITOR:   PO intake, Labs, Weight trends, I & O's  REASON FOR ASSESSMENT:   Consult Assessment of nutrition requirement/status  ASSESSMENT:    Pt admitted with SOB secondary to COPD  Past Medical History  Diagnosis Date  . Chest pain   . Hyperplastic colon polyp   . Allergic rhinitis   . Palpitations   . HPV (human papilloma virus) anogenital infection   . Contraception   . Cough   . Abnormal stress test   . Carpal tunnel syndrome   . Chronic anxiety   . Insomnia   . Pneumonia   . Bipolar disorder ([place])   . HTN (hypertension)   . Abnormal Pap smear 04/28/2012    normal pap /positive hpv      Diet Order:  Diet Heart Room service appropriate?: Yes; Fluid consistency:: Thin    Current Nutrition: Pt reports eating a good breakfast this am.   Food/Nutrition-Related History: Pt reports very good appetite PTA.   Scheduled Medications:  . amphetamine-dextroamphetamine  60 mg Oral Daily  . [START ON 11/09/2015] azithromycin  250 mg Oral Daily  . docusate sodium  100 mg Oral BID  . DULoxetine  60 mg Oral Daily  . enoxaparin (LOVENOX) injection  40 mg Subcutaneous Q24H  . etanercept  50 mg Subcutaneous Weekly  . fluticasone  2 spray Each Nare Daily  . folic acid  1 mg Oral BID  . guaiFENesin  600 mg Oral BID  . hydrochlorothiazide  12.5 mg Oral Daily  . hydroxychloroquine  400 mg Oral Daily  . loratadine  10 mg Oral Daily  . [START ON 12/24/2015] medroxyPROGESTERone  150 mg Intramuscular Q90 days  . [START ON 11/11/2015] methotrexate  20 mg Subcutaneous Weekly  . mometasone-formoterol  2 puff Inhalation BID  . montelukast  10 mg Oral Daily  . nystatin  5 mL Oral QID  . pantoprazole  40 mg Oral Daily  .  potassium chloride  40 mEq Oral Once  . [START ON 11/09/2015] predniSONE  40 mg Oral Q breakfast   Followed by  . [START ON 11/10/2015] predniSONE  30 mg Oral Q breakfast   Followed by  . [START ON 11/11/2015] predniSONE  20 mg Oral Q breakfast   Followed by  . [START ON 11/12/2015] predniSONE  10 mg Oral Q breakfast   Followed by  . [START ON 11/13/2015] predniSONE  5 mg Oral Q breakfast  . propranolol ER  160 mg Oral Daily  . sodium chloride flush  3 mL Intravenous Q12H  . tiotropium  18 mcg Inhalation Daily    Continuous Medications:  . sodium chloride 125 mL/hr at 11/08/15 0805     Electrolyte/Renal Profile and Glucose Profile:   Recent Labs Lab 11/07/15 2359  NA 133*  K 3.2*  CL 104  CO2 20*  BUN 9  CREATININE 0.78  CALCIUM 8.3*  GLUCOSE 97   Protein Profile: No results for input(s): ALBUMIN in the last 168 hours.  Gastrointestinal Profile: Last BM:  11/07/2015   Weight Change: Per [facility] weight encounters pt weight relatively stable   Height:   Ht Readings from Last 1 Encounters:  11/08/15 5' (1.524 m)    Weight:  Wt Readings from Last 1 Encounters:  11/08/15 199 lb (90.266 kg)   Wt Readings from Last 10 Encounters:  11/08/15 199 lb (90.266 kg)  10/30/15 206 lb 12.8 oz (93.804 kg)  09/21/15 206 lb (93.441 kg)  07/08/15 200 lb (90.719 kg)  06/05/15 203 lb (92.08 kg)  03/06/15 197 lb 6.4 oz (89.54 kg)  02/12/15 200 lb (90.719 kg)  11/24/14 206 lb (93.441 kg)  05/21/12 180 lb (81.647 kg)  01/14/12 184 lb 12.8 oz (83.825 kg)    BMI:  Body mass index is 38.86 kg/(m^2).   EDUCATION NEEDS:   No education needs identified at this time   [place], RD, LDN Pager [phone] Weekend/On-Call Pager [phone]

## 2015-11-08 NOTE — Evaluation (Signed)
Occupational Therapy Evaluation Patient Details Name: Susan Gilmore MRN: [id number] DOB: 21-Mar-1966 Today's Date: 11/08/2015    History of Present Illness     Clinical Impression   Pt is 50 year old female who presents to [facility] with Acute Respiratory Distress, and a history of RA.  Pt currently requires increased time to complete ADLs/IADLs due to SOB, and RA. Pt. Has decreased endurance for functional tasks and is at risk for falls.  Pt would benefit from skilled OT services to increase independence in ADLs, review A/E, provide education in energy conservation techniques, work simplification strategies, and to review pursed lip breathing. Pt. Could also benefit from education about joint protection principles.       Follow Up Recommendations       Equipment Recommendations  3 in 1 bedside comode    Recommendations for Other Services PT consult     Precautions / Restrictions        Mobility Bed Mobility                  Transfers                      Balance                                            ADL                                         General ADL Comments: Pt. is independent with self feeding and grooming tasks. Pt. requires increased time and assist to complete bathing and dressing tasks secondary to RA, and pulmonary status.     Vision     Perception     Praxis      Pertinent Vitals/Pain Pain Assessment: 0-10 Pain Score: 10-Worst pain ever Pain Location: Head pain with coughing.     Hand Dominance     Extremity/Trunk Assessment Upper Extremity Assessment Upper Extremity Assessment: Overall WFL for tasks assessed           Communication Communication Communication: No difficulties   Cognition Arousal/Alertness: Awake/alert Behavior During Therapy: WFL for tasks assessed/performed Overall Cognitive Status: Within Functional Limits for tasks assessed                      General Comments       Exercises       Shoulder Instructions      Home Living Family/patient expects to be discharged to:: Private residence Living Arrangements: Spouse/significant other Available Help at Discharge: Family Type of Home: House Home Access: Stairs to enter [employer] of Steps: 6-7 Entrance Stairs-Rails: Right;Left Home Layout: One level     Bathroom Shower/Tub: Walk-in shower;Tub only   Bathroom Toilet: Standard     Home Equipment: None          Prior Functioning/Environment Level of Independence: Independent             OT Diagnosis: Generalized weakness   OT Problem List: Decreased activity tolerance   OT Treatment/Interventions: Self-care/ADL training;Therapeutic activities;Patient/family education;DME and/or AE instruction    OT Goals(Current goals can be found in the care plan section) Acute Rehab OT Goals Patient Stated Goal: To feel better, and review reacher use for self-care Potential  to Achieve Goals: Good ADL Goals Additional ADL Goal #1: Pt. will demonstrate Independence with A/E use for LE dressing.  Additional ADL Goal #2: Pt. will demonstrate knowledge of energy conservation/work simplification techniques for ADL and IADLs  OT Frequency: Min 1X/week   Barriers to D/C:            Co-evaluation              End of Session    Activity Tolerance: Patient tolerated treatment well Patient left:     Time: 0955-1010 OT Time Calculation (min): 15 min Charges:  OT General Charges $OT Visit: 1 Procedure OT Evaluation $OT Eval Low Complexity: 1 Procedure G-Codes:    Harrel Carina, MS, OTR/L  Harrel Carina 11/08/2015, 10:44 AM

## 2015-11-08 NOTE — Clinical Social Work Note (Signed)
CSW received consult for COPD Gold. This is pt's first admission and has no other admissions in the past 6 months. PT is not recommending any follow up. CSW is signing off as no further needs identified. Please reconsult if need arises prior to discharge.   Darden Dates, MSW, LCSW  Clinical Social Worker  [phone]

## 2015-11-08 NOTE — ED Notes (Signed)
Pt brought to ER for shortness of breath all day that has gotten worse

## 2015-11-08 NOTE — H&P (Signed)
Susan Gilmore is an 50 y.o. female.   Chief Complaint: Shortness of breath HPI: The patient with past medical history of COPD as well as rheumatoid arthritis presents emergency department complaining of shortness of breath and cough. The latter began approximately 5 days ago but her dyspnea has progressively worsened over the last 2 days. She's been using her inhalers at home without much relief. She denies chest pain or fevers. In the emergency department the patient received Solu-Medrol as well as multiple breathing treatments but still continued to wheeze which prompted the emergency department staff to call the hospitalist service for admission.  Past Medical History  Diagnosis Date  . Chest pain   . Hyperplastic colon polyp   . Allergic rhinitis   . Palpitations   . HPV (human papilloma virus) anogenital infection   . Contraception   . Cough   . Abnormal stress test   . Carpal tunnel syndrome   . Chronic anxiety   . Insomnia   . Pneumonia   . Bipolar disorder ([place])   . HTN (hypertension)   . Abnormal Pap smear 04/28/2012    normal pap /positive hpv     Past Surgical History  Procedure Laterality Date  . Ganglion cyst excision    . Wisdom tooth extraction    . Full mouth dental      Family History  Problem Relation Age of Onset  . Stroke Paternal Grandmother   . Heart disease Paternal Grandmother   . Diabetes Maternal Grandmother   . Heart disease Maternal Grandmother   . Hypertension Maternal Grandmother   . Heart disease Maternal Grandfather   . Hypertension Maternal Grandfather   . Heart disease Father   . Stroke Father   . Hypertension Father   . Hyperlipidemia Father   . Diabetes Mother   . Heart attack Mother    Social History:  reports that she quit smoking about 2 years ago. Her smoking use included Cigarettes. She has a 26 pack-year smoking history. She has never used smokeless tobacco. She reports that she does not drink alcohol or use illicit  drugs.  Allergies:  Allergies  Allergen Reactions  . Effexor [Venlafaxine Hcl] Swelling  . Isosorbide Other (See Comments)    Reaction: Bad headache    Prior to Admission medications   Medication Sig Start Date End Date Taking? Authorizing Provider  albuterol (PROAIR HFA) 108 (90 Base) MCG/ACT inhaler Inhale 1-2 puffs into the lungs every 6 (six) hours as needed for wheezing or shortness of breath. 10/12/15  Yes Clearnce Sorrel Burnette, PA-C  ALPRAZolam [name]) 1 MG tablet Take 1 mg by mouth 4 (four) times daily as needed for anxiety.    Yes Historical Provider, MD  amphetamine-dextroamphetamine (ADDERALL XR) 30 MG 24 hr capsule Take 60 mg by mouth daily. 05/05/15  Yes Historical Provider, MD  B-D TB SYRINGE 1CC/27GX1/2" 27G X 1/2" 1 ML MISC USE TO INJECT METHOTREXATE WEEKLY 30 DAYS 04/20/15  Yes Historical Provider, MD  budesonide (PULMICORT) 0.25 MG/2ML nebulizer solution Take 2 mLs (0.25 mg total) by nebulization every 6 (six) hours as needed. Patient taking differently: Take 0.25 mg by nebulization every 6 (six) hours as needed (for shortness of breath).  10/02/15  Yes Clearnce Sorrel Burnette, PA-C  DULoxetine (CYMBALTA) 60 MG capsule Take 60 mg by mouth daily.   Yes Historical Provider, MD  ENBREL SURECLICK 50 MG/ML injection Inject 50 mg as directed once a week.  09/20/15  Yes Historical Provider, MD  Eszopiclone 3  MG TABS Take 3 mg by mouth at bedtime as needed (sleep).  03/16/15  Yes Historical Provider, MD  fexofenadine (ALLEGRA) 180 MG tablet Take 180 mg by mouth daily.   Yes Historical Provider, MD  fluticasone (FLONASE) 50 MCG/ACT nasal spray Place 2 sprays into both nostrils daily. 03/15/15  Yes Clearnce Sorrel Burnette, PA-C  Fluticasone-Salmeterol (ADVAIR) 250-50 MCG/DOSE AEPB Inhale 1 puff into the lungs 2 (two) times daily.   Yes Historical Provider, MD  folic acid (FOLVITE) 1 MG tablet Take 1 tablet by mouth 2 (two) times daily.   Yes Historical Provider, MD  hydrochlorothiazide (HYDRODIURIL)  12.5 MG tablet Take 1 tablet by mouth daily. 10/17/14  Yes Historical Provider, MD  HYDROcodone-homatropine (HYCODAN) 5-1.5 MG/5ML syrup Take 5 mLs by mouth every 8 (eight) hours as needed for cough. 10/02/15  Yes Clearnce Sorrel Burnette, PA-C  hydroxychloroquine (PLAQUENIL) 200 MG tablet TAKE 2 TABLETS BY MOUTH WITH FOOD OR MILK ONCE A DAY 10/16/15  Yes Historical Provider, MD  ipratropium-albuterol (DUONEB) 0.5-2.5 (3) MG/3ML SOLN Take 3 mLs by nebulization every 6 (six) hours as needed (shortness of breath).   Yes Historical Provider, MD  loratadine (CLARITIN) 10 MG tablet Take 1 tablet by mouth daily. 12/19/14  Yes Historical Provider, MD  medroxyPROGESTERone (DEPO-PROVERA) 150 MG/ML injection Inject 1 mL (150 mg total) into the muscle every 3 (three) months. 09/20/15  Yes Clearnce Sorrel Burnette, PA-C  meloxicam (MOBIC) 15 MG tablet Take 15 mg by mouth daily as needed for pain.   Yes Historical Provider, MD  Menthol-Methyl Salicylate (MUSCLE RUB) 10-15 % CREA Apply 1 application topically as needed for muscle pain.    Yes Historical Provider, MD  methotrexate 50 MG/2ML injection Inject 20 mg into the vein once a week.   Yes Historical Provider, MD  montelukast (SINGULAIR) 10 MG tablet Take 1 tablet by mouth daily. 12/19/14  Yes Historical Provider, MD  nystatin (MYCOSTATIN) 100000 UNIT/ML suspension Take 5 mLs (500,000 Units total) by mouth 4 (four) times daily. 10/30/15  Yes Clearnce Sorrel Burnette, PA-C  omeprazole (PRILOSEC) 20 MG capsule Take 1 capsule (20 mg total) by mouth daily. 03/21/15  Yes [name], PA-C  oxyCODONE-acetaminophen (PERCOCET) 10-325 MG tablet Take 1 tablet by mouth daily as needed for pain.  10/12/15  Yes Historical Provider, MD  propranolol ER (INDERAL LA) 160 MG SR capsule Take 160 mg by mouth daily.   Yes Historical Provider, MD     Results for orders placed or performed during the hospital encounter of 11/07/15 (from the past 48 hour(s))  Basic metabolic panel     Status:  Abnormal   Collection Time: 11/07/15 11:59 PM  Result Value Ref Range   Sodium 133 (L) 135 - 145 mmol/L   Potassium 3.2 (L) 3.5 - 5.1 mmol/L   Chloride 104 101 - 111 mmol/L   CO2 20 (L) 22 - 32 mmol/L   Glucose, Bld 97 65 - 99 mg/dL   BUN 9 6 - 20 mg/dL   Creatinine, Ser 0.78 0.44 - 1.00 mg/dL   Calcium 8.3 (L) 8.9 - 10.3 mg/dL   GFR calc non Af Amer >60 >60 mL/min   GFR calc Af Amer >60 >60 mL/min    Comment: (NOTE) The eGFR has been calculated using the CKD EPI equation. This calculation has not been validated in all clinical situations. eGFR's persistently <60 mL/min signify possible Chronic Kidney Disease.    Anion gap 9 5 - 15  CBC     Status:  Abnormal   Collection Time: 11/07/15 11:59 PM  Result Value Ref Range   WBC 7.3 3.6 - 11.0 K/uL   RBC 4.07 3.80 - 5.20 MIL/uL   Hemoglobin 12.6 12.0 - 16.0 g/dL   HCT 36.2 35.0 - 47.0 %   MCV 88.8 80.0 - 100.0 fL   MCH 30.9 26.0 - 34.0 pg   MCHC 34.8 32.0 - 36.0 g/dL   RDW 17.7 (H) 11.5 - 14.5 %   Platelets 164 150 - 440 K/uL  Troponin I     Status: None   Collection Time: 11/07/15 11:59 PM  Result Value Ref Range   Troponin I <0.03 <0.031 ng/mL    Comment:        NO INDICATION OF MYOCARDIAL INJURY.   Troponin I     Status: None   Collection Time: 11/08/15  2:50 AM  Result Value Ref Range   Troponin I <0.03 <0.031 ng/mL    Comment:        NO INDICATION OF MYOCARDIAL INJURY.   Blood gas, venous     Status: Abnormal   Collection Time: 11/08/15  3:39 AM  Result Value Ref Range   pH, Ven 7.43 7.320 - 7.430   pCO2, Ven 31 (L) 44.0 - 60.0 mmHg   pO2, Ven 113.0 (H) 31.0 - 45.0 mmHg   Bicarbonate 20.6 (L) 21.0 - 28.0 mEq/L   Acid-base deficit 2.8 (H) 0.0 - 2.0 mmol/L   O2 Saturation 98.5 %   Patient temperature 37.0    Collection site VENOUS    Sample type VENOUS    Dg Chest Port 1 View  11/08/2015  CLINICAL DATA:  Dyspnea EXAM: PORTABLE CHEST 1 VIEW COMPARISON:  02/25/2013 FINDINGS: Chronic bronchitic markings. Normal  heart size and mediastinal contours. There is no edema, consolidation, effusion, or pneumothorax. IMPRESSION: Chronic bronchitic markings.  No evidence of acute disease. Electronically Signed   By: Monte Fantasia M.D.   On: 11/08/2015 00:45    Review of Systems  Constitutional: Negative for fever and chills.  HENT: Negative for sore throat and tinnitus.   Eyes: Negative for blurred vision and redness.  Respiratory: Negative for cough and shortness of breath.   Cardiovascular: Negative for chest pain, palpitations, orthopnea and PND.  Gastrointestinal: Negative for nausea, vomiting, abdominal pain and diarrhea.  Genitourinary: Negative for dysuria, urgency and frequency.  Musculoskeletal: Negative for myalgias and joint pain.  Skin: Negative for rash.       No lesions  Neurological: Negative for speech change, focal weakness and weakness.  Endo/Heme/Allergies: Does not bruise/bleed easily.       No temperature intolerance  Psychiatric/Behavioral: Negative for depression and suicidal ideas.    Blood pressure 148/76, pulse 81, temperature 98.9 F (37.2 C), temperature source Oral, resp. rate 24, height 5' (1.524 m), weight 91.173 kg (201 lb), SpO2 94 %. Physical Exam  Vitals reviewed. Constitutional: She is oriented to person, place, and time. She appears well-developed and well-nourished. No distress.  HENT:  Head: Normocephalic and atraumatic.  Mouth/Throat: Oropharynx is clear and moist.  Eyes: Conjunctivae and EOM are normal. Pupils are equal, round, and reactive to light. No scleral icterus.  Neck: Normal range of motion. Neck supple. No JVD present. No tracheal deviation present. No thyromegaly present.  Cardiovascular: Normal rate, regular rhythm and normal heart sounds.  Exam reveals no gallop and no friction rub.   No murmur heard. Respiratory: Effort normal. She has wheezes.  GI: Soft. Bowel sounds are normal. She exhibits no  distension. There is no tenderness.   Genitourinary:  Deferred  Musculoskeletal: Normal range of motion. She exhibits no edema.  Lymphadenopathy:    She has no cervical adenopathy.  Neurological: She is alert and oriented to person, place, and time. No cranial nerve deficit. She exhibits normal muscle tone.  Skin: Skin is warm and dry. No rash noted. No erythema.  Psychiatric: She has a normal mood and affect. Her behavior is normal. Judgment and thought content normal.     Assessment/Plan This is a 50 year old female with complicated medical history admitted respiratory failure secondary to COPD. 1. Respiratory failure: Acute on chronic; hypoxic to the mid 80s without oxygen during periods of time between breathing treatments. She has cough and increased sputum production. She is received intravenous steroids and we will continue steroid taper. Pulmonology consult ordered. 2. ST wave changes: The patient has some inverted ST segments in her lateral leads. This may indicate heart strain as her troponin is negative and she has denied chest pain. Continue to follow cardiac biomarkers. Cardiology consultation at the discretion of the primary team. 3. COPD: I started the patient on Spiriva. Continue inhaled corticosteroid with long-acting bronchial agonist. (I have discontinued due to inhaled steroid therapy) 4. Essential hypertension: Uncontrolled this time; continue HCTZ and propranolol to monitor in case antihypertensive medication should be started once the patient is no longer in respiratory distress 5. Elevated rheumatoid factor: Continue methotrexate and Plaquenil. Enbrel scheduled per home regimen 6. Depression: Continue Cymbalta 7. DVT prophylaxis: Lovenox 8. GI prophylaxis: Pantoprazole per home regimen The patient is a full code. Time spent on admission was inpatient care approximately 45 minutes  Harrie Foreman, MD 11/08/2015, 6:57 AM

## 2015-11-08 NOTE — Evaluation (Signed)
Physical Therapy Evaluation Patient Details Name: Susan Gilmore MRN: [id number] DOB: 1966-02-13 Today's Date: 11/08/2015   History of Present Illness  presented to ER secondary to SOB, cough; admitted with acute/chronic respiratory failure related to COPD exacerbation (not requiring supplemental O2)    Clinical Impression  Upon evaluation, patient alert and oriented; follows all commands and demonstrates good safety awareness/insight.  Bilat UE/LE strength and ROM grossly WFL and symmetrical; no focal weakness or sensory deficit noted.  Able to complete bed mobility indep; sit/stand, basic transfers and gait (220') without assist device, mod indep.  No balance deficits or safety concerns noted.  Gait speed steady and WFL, completing 10' walk time in 6 seconds. SaO2 stable and WFL on RA, maintaining sats >93-94% on RA at rest and with activity throughout session. No acute PT needs identified at this time, as patient mod indep with all mobility and at baseline level of functional ability. Did encourage progressive mobility throughout unit during remaining hospitalization to promote functional endurance/strength.  Patient voiced understanding.  Will complete initial order at this time; please re-consult should needs change.     Follow Up Recommendations No PT follow up    Equipment Recommendations       Recommendations for Other Services       Precautions / Restrictions Precautions Precautions: Fall Restrictions Weight Bearing Restrictions: No      Mobility  Bed Mobility Overal bed mobility: Independent                Transfers Overall transfer level: Modified independent Equipment used: None                Ambulation/Gait Ambulation/Gait assistance: Modified independent (Device/Increase time) Ambulation Distance (Feet): 220 Feet Assistive device: None   Gait velocity: 10' walk time, 6 seconds   General Gait Details: reciprocal stepping with fair/good step  height/length, cadence and overall gait mechanics  Stairs            Wheelchair Mobility    Modified Rankin (Stroke Patients Only)       Balance Overall balance assessment: Independent                                           Pertinent Vitals/Pain Pain Assessment: No/denies pain Pain Score: 10-Worst pain ever Pain Location: Head pain with coughing.    Home Living Family/patient expects to be discharged to:: Private residence Living Arrangements: Spouse/significant other Available Help at Discharge: Family Type of Home: House Home Access: Stairs to enter Entrance Stairs-Rails: Left Entrance Stairs-Number of Steps: 7 Home Layout: One level Home Equipment: None      Prior Function Level of Independence: Independent         Comments: Indep with all ADLs, household and community activities; denies fall history     Hand Dominance        Extremity/Trunk Assessment   Upper Extremity Assessment: Overall WFL for tasks assessed           Lower Extremity Assessment: Overall WFL for tasks assessed         Communication   Communication: No difficulties  Cognition Arousal/Alertness: Awake/alert Behavior During Therapy: WFL for tasks assessed/performed Overall Cognitive Status: Within Functional Limits for tasks assessed                      General Comments  Exercises Other Exercises Other Exercises: Toilet transfer, ambulatory to/from standard toilet, indep; hand hygiene at sink, mod indep.  Standing functional reach noted WFL; no safety concerns.      Assessment/Plan    PT Assessment Patent does not need any further PT services  PT Diagnosis     PT Problem List    PT Treatment Interventions     PT Goals (Current goals can be found in the Care Plan section) Acute Rehab PT Goals Patient Stated Goal: to get myself back home PT Goal Formulation: All assessment and education complete, DC therapy    Frequency      Barriers to discharge        Co-evaluation               End of Session   Activity Tolerance: Patient tolerated treatment well Patient left: in bed;with call bell/phone within reach           Time: 1140-1152 PT Time Calculation (min) (ACUTE ONLY): 12 min   Charges:   PT Evaluation $PT Eval Low Complexity: 1 Procedure     PT G Codes:        Gae Bihl H. Owens Shark, PT, DPT, NCS 11/08/2015, 12:04 PM [phone]

## 2015-11-08 NOTE — Telephone Encounter (Signed)
Patient went to ER and was admitted with acute on chronic respiratory failure.

## 2015-11-09 ENCOUNTER — Telehealth: Payer: Self-pay | Admitting: Physician Assistant

## 2015-11-09 LAB — BASIC METABOLIC PANEL
ANION GAP: 6 (ref 5–15)
BUN: 14 mg/dL (ref 6–20)
CHLORIDE: 108 mmol/L (ref 101–111)
CO2: 20 mmol/L — ABNORMAL LOW (ref 22–32)
CREATININE: 0.74 mg/dL (ref 0.44–1.00)
Calcium: 8.4 mg/dL — ABNORMAL LOW (ref 8.9–10.3)
GLUCOSE: 98 mg/dL (ref 65–99)
POTASSIUM: 3.9 mmol/L (ref 3.5–5.1)
Sodium: 134 mmol/L — ABNORMAL LOW (ref 135–145)

## 2015-11-09 LAB — CBC
HEMATOCRIT: 36 % (ref 35.0–47.0)
HEMOGLOBIN: 12.4 g/dL (ref 12.0–16.0)
MCH: 31.2 pg (ref 26.0–34.0)
MCHC: 34.5 g/dL (ref 32.0–36.0)
MCV: 90.3 fL (ref 80.0–100.0)
Platelets: 168 10*3/uL (ref 150–440)
RBC: 3.99 MIL/uL (ref 3.80–5.20)
RDW: 17.6 % — AB (ref 11.5–14.5)
WBC: 8.7 10*3/uL (ref 3.6–11.0)

## 2015-11-09 MED ORDER — PREDNISONE 10 MG (21) PO TBPK: 10.0000 mg | ORAL_TABLET | Freq: Every day | ORAL | Status: DC

## 2015-11-09 MED ORDER — CEFUROXIME AXETIL 500 MG PO TABS: 500.0000 mg | ORAL_TABLET | Freq: Two times a day (BID) | ORAL | Status: AC

## 2015-11-09 NOTE — Discharge Instructions (Signed)
DIET:  Cardiac diet  DISCHARGE CONDITION:  Stable  ACTIVITY:  Activity as tolerated  OXYGEN:  Home Oxygen: No.   Oxygen Delivery: room air  DISCHARGE LOCATION:  home    ADDITIONAL DISCHARGE INSTRUCTION:   If you experience worsening of your admission symptoms, develop shortness of breath, life threatening emergency, suicidal or homicidal thoughts you must seek medical attention immediately by calling 911 or calling your MD immediately  if symptoms less severe.  You Must read complete instructions/literature along with all the possible adverse reactions/side effects for all the Medicines you take and that have been prescribed to you. Take any new Medicines after you have completely understood and accpet all the possible adverse reactions/side effects.   Please note  You were cared for by a hospitalist during your hospital stay. If you have any questions about your discharge medications or the care you received while you were in the hospital after you are discharged, you can call the unit and asked to speak with the hospitalist on call if the hospitalist that took care of you is not available. Once you are discharged, your primary care physician will handle any further medical issues. Please note that NO REFILLS for any discharge medications will be authorized once you are discharged, as it is imperative that you return to your primary care physician (or establish a relationship with a primary care physician if you do not have one) for your aftercare needs so that they can reassess your need for medications and monitor your lab values.   Chronic Obstructive Pulmonary Disease Exacerbation Chronic obstructive pulmonary disease (COPD) is a common lung problem. In COPD, the flow of air from the lungs is limited. COPD exacerbations are times that breathing gets worse and you need extra treatment. Without treatment they can be life threatening. If they happen often, your lungs can become  more damaged. If your COPD gets worse, your doctor may treat you with:  Medicines.  Oxygen.  Different ways to clear your airway, such as using a mask. HOME CARE  Do not smoke.  Avoid tobacco smoke and other things that bother your lungs.  If given, take your antibiotic medicine as told. Finish the medicine even if you start to feel better.  Only take medicines as told by your doctor.  Drink enough fluids to keep your pee (urine) clear or pale yellow (unless your doctor has told you not to).  Use a cool mist machine (vaporizer).  If you use oxygen or a machine that turns liquid medicine into a mist (nebulizer), continue to use them as told.  Keep up with shots (vaccinations) as told by your doctor.  Exercise regularly.  Eat healthy foods.  Keep all doctor visits as told. GET HELP RIGHT AWAY IF:  You are very short of breath and it gets worse.  You have trouble talking.  You have bad chest pain.  You have blood in your spit (sputum).  You have a fever.  You keep throwing up (vomiting).  You feel weak, or you pass out (faint).  You feel confused.  You keep getting worse. MAKE SURE YOU:  Understand these instructions.  Will watch your condition.  Will get help right away if you are not doing well or get worse.   This information is not intended to replace advice given to you by your health care provider. Make sure you discuss any questions you have with your health care provider.   Document Released: 08/01/2011 Document Revised: 09/02/2014 Document Reviewed:  04/16/2013 Elsevier Interactive Patient Education [employer].

## 2015-11-09 NOTE — Progress Notes (Signed)
Pt being discharged home, discharge instructions reviewed with pt, states understanding, pt with no noted complaints at discharge, no distress or discomfort noted, pt very eager to go home

## 2015-11-09 NOTE — Telephone Encounter (Signed)
Pt is being discharged from [facility] for COPD today.  I have scheduled a hospital follow appt.Susan Gilmore

## 2015-11-09 NOTE — Discharge Summary (Signed)
Susan Gilmore, 50 y.o., DOB 10/14/1965, MRN [id number]. Admission date: 11/07/2015 Discharge Date 11/09/2015 Primary MD [name], PA-C Admitting Physician Harrie Foreman, MD  Admission Diagnosis  COPD exacerbation [facility]) [J44.1]  Discharge Diagnosis   Active Problems:   Acute on chronic COPD exasperation Acute bronchitis Allergic rhinitis Carpal tunnel syndrome Chronic anxiety  Bipolar disorder Hypertension    Hospital Course The patient with past medical history of COPD as well as rheumatoid arthritis presents emergency department complaining of shortness of breath and cough. Patient has chronic COPD and reported that his been 5 days with progressively worsening. She was noted to be hypoxic and was treated with antibiotics and steroids and nebulizers. With significant improvement in her symptoms. She was seen by her pulmonologist who agreed with the plan. Patient's breathing is much improved today she is on room air and very anxious to go home.            Consults  pulmonary/intensive care  Significant Tests:  See full reports for all details      Dg Chest Port 1 View  11/08/2015  CLINICAL DATA:  Dyspnea EXAM: PORTABLE CHEST 1 VIEW COMPARISON:  02/25/2013 FINDINGS: Chronic bronchitic markings. Normal heart size and mediastinal contours. There is no edema, consolidation, effusion, or pneumothorax. IMPRESSION: Chronic bronchitic markings.  No evidence of acute disease. Electronically Signed   By: Monte Fantasia M.D.   On: 11/08/2015 00:45       Today   Subjective:   Susan Gilmore patient's breathing much improved  Objective:   Blood pressure 114/74, pulse 62, temperature 98.4 F (36.9 C), temperature source Oral, resp. rate 18, height 5' (1.524 m), weight 96.253 kg (212 lb 3.2 oz), SpO2 95 %.  .  Intake/Output Summary (Last 24 hours) at 11/09/15 1430 Last data filed at 11/09/15 0943  Gross per 24 hour  Intake    480 ml  Output      0 ml  Net    480  ml    Exam VITAL SIGNS: Blood pressure 114/74, pulse 62, temperature 98.4 F (36.9 C), temperature source Oral, resp. rate 18, height 5' (1.524 m), weight 96.253 kg (212 lb 3.2 oz), SpO2 95 %.  GENERAL:  50 y.o.-year-old patient lying in the bed with no acute distress.  EYES: Pupils equal, round, reactive to light and accommodation. No scleral icterus. Extraocular muscles intact.  HEENT: Head atraumatic, normocephalic. Oropharynx and nasopharynx clear.  NECK:  Supple, no jugular venous distention. No thyroid enlargement, no tenderness.  LUNGS: Normal breath sounds bilaterally, no wheezing, rales,rhonchi or crepitation. No use of accessory muscles of respiration.  CARDIOVASCULAR: S1, S2 normal. No murmurs, rubs, or gallops.  ABDOMEN: Soft, nontender, nondistended. Bowel sounds present. No organomegaly or mass.  EXTREMITIES: No pedal edema, cyanosis, or clubbing.  NEUROLOGIC: Cranial nerves II through XII are intact. Muscle strength 5/5 in all extremities. Sensation intact. Gait not checked.  PSYCHIATRIC: The patient is alert and oriented x 3.  SKIN: No obvious rash, lesion, or ulcer.   Data Review     CBC w Diff: Lab Results  Component Value Date   WBC 8.7 11/09/2015   WBC 9.1 05/27/2014   WBC 11.9 02/04/2014   HGB 12.4 11/09/2015   HGB 12.2 05/27/2014   HCT 36.0 11/09/2015   HCT 37.3 05/27/2014   PLT 168 11/09/2015   PLT 238 05/27/2014   LYMPHOPCT 14.4 05/27/2014   MONOPCT 7.1 05/27/2014   MONOPCT 6 02/24/2014   EOSPCT 3.3 05/27/2014  BASOPCT 1.5 05/27/2014   CMP: Lab Results  Component Value Date   NA 134* 11/09/2015   NA 143 06/22/2014   NA 143 06/22/2014   K 3.9 11/09/2015   K 3.6 06/22/2014   CL 108 11/09/2015   CL 109* 06/22/2014   CO2 20* 11/09/2015   CO2 26 06/22/2014   BUN 14 11/09/2015   BUN 17 06/22/2014   BUN 17 06/22/2014   CREATININE 0.74 11/09/2015   CREATININE 0.81 06/22/2014   CREATININE 0.8 06/22/2014   GLU 95 06/22/2014   PROT 6.6  06/22/2014   ALBUMIN 3.3* 06/22/2014   BILITOT 0.5 06/22/2014   ALKPHOS 86 06/22/2014   ALKPHOS 86 06/22/2014   AST 24 06/22/2014   AST 24 06/22/2014   ALT 60 06/22/2014   ALT 60* 06/22/2014  .  Micro Results No results found for this or any previous visit (from the past 240 hour(s)).      Code Status Orders        Start     Ordered   11/08/15 0757  Full code   Continuous     11/08/15 0756    Code Status History    Date Active Date Inactive Code Status Order ID Comments User Context   This patient has a current code status but no historical code status.          Follow-up Information    Follow up with [name], PA-C. Go on 11/16/2015.   Specialty:  Family Medicine   Why:  at 2:00pm   Contact information:   [address] [place] [id number] [phone]       Discharge Medications     Medication List    TAKE these medications        albuterol 108 (90 Base) MCG/ACT inhaler  Commonly known as:  PROAIR HFA  Inhale 1-2 puffs into the lungs every 6 (six) hours as needed for wheezing or shortness of breath.     ALPRAZolam 1 MG tablet  Commonly known as:  XANAX  Take 1 mg by mouth 4 (four) times daily as needed for anxiety.     amphetamine-dextroamphetamine 30 MG 24 hr capsule  Commonly known as:  ADDERALL XR  Take 60 mg by mouth daily.     B-D TB SYRINGE 1CC/27GX1/2" 27G X 1/2" 1 ML Misc  Generic drug:  TUBERCULIN SYR 1CC/27GX1/2"  USE TO INJECT METHOTREXATE WEEKLY 30 DAYS     budesonide 0.25 MG/2ML nebulizer solution  Commonly known as:  PULMICORT  Take 2 mLs (0.25 mg total) by nebulization every 6 (six) hours as needed.     cefUROXime 500 MG tablet  Commonly known as:  CEFTIN  Take 1 tablet (500 mg total) by mouth 2 (two) times daily with a meal.     DULoxetine 60 MG capsule  Commonly known as:  CYMBALTA  Take 60 mg by mouth daily.     ENBREL SURECLICK 50 MG/ML injection  Generic drug:  etanercept  Inject 50 mg as  directed once a week.     Eszopiclone 3 MG Tabs  Take 3 mg by mouth at bedtime as needed (sleep).     fexofenadine 180 MG tablet  Commonly known as:  ALLEGRA  Take 180 mg by mouth daily.     fluticasone 50 MCG/ACT nasal spray  Commonly known as:  FLONASE  Place 2 sprays into both nostrils daily.     Fluticasone-Salmeterol 250-50 MCG/DOSE Aepb  Commonly known as:  ADVAIR  Inhale 1 puff into the lungs 2 (two) times daily.     folic acid 1 MG tablet  Commonly known as:  FOLVITE  Take 1 tablet by mouth 2 (two) times daily.     hydrochlorothiazide 12.5 MG tablet  Commonly known as:  HYDRODIURIL  Take 1 tablet by mouth daily.     HYDROcodone-homatropine 5-1.5 MG/5ML syrup  Commonly known as:  HYCODAN  Take 5 mLs by mouth every 8 (eight) hours as needed for cough.     hydroxychloroquine 200 MG tablet  Commonly known as:  PLAQUENIL  TAKE 2 TABLETS BY MOUTH WITH FOOD OR MILK ONCE A DAY     ipratropium-albuterol 0.5-2.5 (3) MG/3ML Soln  Commonly known as:  DUONEB  Take 3 mLs by nebulization every 6 (six) hours as needed (shortness of breath).     loratadine 10 MG tablet  Commonly known as:  CLARITIN  Take 1 tablet by mouth daily.     medroxyPROGESTERone 150 MG/ML injection  Commonly known as:  DEPO-PROVERA  Inject 1 mL (150 mg total) into the muscle every 3 (three) months.     meloxicam 15 MG tablet  Commonly known as:  MOBIC  Take 15 mg by mouth daily as needed for pain.     methotrexate 50 MG/2ML injection  Inject 20 mg into the skin once a week.     montelukast 10 MG tablet  Commonly known as:  SINGULAIR  Take 1 tablet by mouth daily.     MUSCLE RUB 10-15 % Crea  Apply 1 application topically as needed for muscle pain.     nystatin 100000 UNIT/ML suspension  Commonly known as:  MYCOSTATIN  Take 5 mLs (500,000 Units total) by mouth 4 (four) times daily.     omeprazole 20 MG capsule  Commonly known as:  PRILOSEC  Take 1 capsule (20 mg total) by mouth daily.      oxyCODONE-acetaminophen 10-325 MG tablet  Commonly known as:  PERCOCET  Take 1 tablet by mouth daily as needed for pain.     predniSONE 10 MG (21) Tbpk tablet  Commonly known as:  STERAPRED UNI-PAK 21 TAB  Take 1 tablet (10 mg total) by mouth daily.     propranolol ER 160 MG SR capsule  Commonly known as:  INDERAL LA  Take 160 mg by mouth daily.           Total Time in preparing paper work, data evaluation and todays exam - 35 minutes  Dustin Flock M.D on 11/09/2015 at 2:30 PM  [facility]   Office  [phone]

## 2015-11-09 NOTE — Care Management (Signed)
Admitted to [facility] with the diagnosis of respiratory distress. Lives with husband, Juanda Crumble, [phone]). Last seen Fenton Malling PA at [facility] 10/27/15. 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 Takes care of all basic and instrumental activities of daily living herself, limited driving. No kids in the home. No falls. No seizure activity. Uses no aids for ambulation. States she has a hard time paying for medications. Some co-pays are $260. Gave application to [employer]. Encouraged to call over and make an appointment with [employer] to discuss help with medications.  Either husband or friend will transport. Shelbie Ammons RN MSN CCM Care Management [phone]

## 2015-11-10 NOTE — Telephone Encounter (Signed)
Ok thank you 

## 2015-11-15 ENCOUNTER — Ambulatory Visit (INDEPENDENT_AMBULATORY_CARE_PROVIDER_SITE_OTHER): Payer: Medicare Other | Admitting: Physician Assistant

## 2015-11-15 ENCOUNTER — Encounter: Payer: Self-pay | Admitting: Physician Assistant

## 2015-11-15 VITALS — BP 160/90 | HR 85 | Temp 98.6°F | Resp 18 | Wt 201.6 lb

## 2015-11-15 DIAGNOSIS — J441 Chronic obstructive pulmonary disease with (acute) exacerbation: Secondary | ICD-10-CM | POA: Diagnosis not present

## 2015-11-15 DIAGNOSIS — G8929 Other chronic pain: Secondary | ICD-10-CM | POA: Diagnosis not present

## 2015-11-15 DIAGNOSIS — M069 Rheumatoid arthritis, unspecified: Secondary | ICD-10-CM | POA: Diagnosis not present

## 2015-11-15 MED ORDER — OXYCODONE HCL ER 10 MG PO T12A: 10.0000 mg | EXTENDED_RELEASE_TABLET | Freq: Two times a day (BID) | ORAL | Status: DC

## 2015-11-15 NOTE — Patient Instructions (Signed)

## 2015-11-15 NOTE — Progress Notes (Signed)
Patient: Susan Gilmore Female    DOB: September 11, 1965   50 y.o.   MRN: [id number] Visit Date: 11/15/2015  Today's Provider: [name], PA-C   Chief Complaint  Patient presents with  . Hospitalization Follow-up   Subjective:    HPI  Follow up Hospitalization  Patient was admitted to [facility] on 11/07/15 and discharged on 11/09/15 . She was treated for COPD Exacerbation. Treatment for this included treated with antibiotics and steroids and nebulizers. She reports good compliance with treatment. She reports this condition is Unchanged.She reports not feeling good. Symptoms chest tightness, wheezing, SOB, fatigue, chills, cough ,congestion, leg swelling and feet, sweats.Symptoms chronic dyspnea, inability to climb stairs and non-productive cough does worsen with exertion. Patient uses 1 pillows at night. Patient can walk 16 steps before resting. Patient currently is not on home oxygen therapy. Respiratory history: asthma, frequent episodes of bronchitis and COPD.No fever, chest pain or palpitations. Treatment: Albuterol inhaler, DUONEB solution with all of her other medication.  She is to follow up with Pulmonology but has not yet had this appointment made for her.   She also is complaining of chronic pain from her RA. She used to get pain medication from Dr. Pearline Cables.  She states that she was losing her insurance and asked her orthopedic (Dr. Nelva Bush) for another Rx. She did not inform Dr. Pearline Cables of this and was told she would not refill her medication any longer.     Allergies  Allergen Reactions  . Effexor [Venlafaxine Hcl] Swelling  . Isosorbide Other (See Comments)    Reaction: Bad headache   Previous Medications   ALBUTEROL (PROAIR HFA) 108 (90 BASE) MCG/ACT INHALER    Inhale 1-2 puffs into the lungs every 6 (six) hours as needed for wheezing or shortness of breath.   ALPRAZOLAM (XANAX) 1 MG TABLET    Take 1 mg by mouth 4 (four) times daily as needed for anxiety.    AMPHETAMINE-DEXTROAMPHETAMINE (ADDERALL XR) 30 MG 24 HR CAPSULE    Take 60 mg by mouth daily.   B-D TB SYRINGE 1CC/27GX1/2" 27G X 1/2" 1 ML MISC    USE TO INJECT METHOTREXATE WEEKLY 30 DAYS   BUDESONIDE (PULMICORT) 0.25 MG/2ML NEBULIZER SOLUTION    Take 2 mLs (0.25 mg total) by nebulization every 6 (six) hours as needed.   DULOXETINE (CYMBALTA) 60 MG CAPSULE    Take 60 mg by mouth daily.   ENBREL SURECLICK 50 MG/ML INJECTION    Inject 50 mg as directed once a week.    ESZOPICLONE 3 MG TABS    Take 3 mg by mouth at bedtime as needed (sleep).    FEXOFENADINE (ALLEGRA) 180 MG TABLET    Take 180 mg by mouth daily.   FLUTICASONE (FLONASE) 50 MCG/ACT NASAL SPRAY    Place 2 sprays into both nostrils daily.   FLUTICASONE-SALMETEROL (ADVAIR) 250-50 MCG/DOSE AEPB    Inhale 1 puff into the lungs 2 (two) times daily.   FOLIC ACID (FOLVITE) 1 MG TABLET    Take 1 tablet by mouth 2 (two) times daily.   HYDROCHLOROTHIAZIDE (HYDRODIURIL) 12.5 MG TABLET    Take 1 tablet by mouth daily.   HYDROCODONE-HOMATROPINE (HYCODAN) 5-1.5 MG/5ML SYRUP    Take 5 mLs by mouth every 8 (eight) hours as needed for cough.   HYDROXYCHLOROQUINE (PLAQUENIL) 200 MG TABLET    TAKE 2 TABLETS BY MOUTH WITH FOOD OR MILK ONCE A DAY   IPRATROPIUM-ALBUTEROL (DUONEB) 0.5-2.5 (3) MG/3ML SOLN  Take 3 mLs by nebulization every 6 (six) hours as needed (shortness of breath).   LORATADINE (CLARITIN) 10 MG TABLET    Take 1 tablet by mouth daily.   MEDROXYPROGESTERONE (DEPO-PROVERA) 150 MG/ML INJECTION    Inject 1 mL (150 mg total) into the muscle every 3 (three) months.   MELOXICAM (MOBIC) 15 MG TABLET    Take 15 mg by mouth daily as needed for pain.   MENTHOL-METHYL SALICYLATE (MUSCLE RUB) 10-15 % CREA    Apply 1 application topically as needed for muscle pain.    METHOTREXATE 50 MG/2ML INJECTION    Inject 20 mg into the skin once a week.    MONTELUKAST (SINGULAIR) 10 MG TABLET    Take 1 tablet by mouth daily.   NYSTATIN (MYCOSTATIN) 100000  UNIT/ML SUSPENSION    Take 5 mLs (500,000 Units total) by mouth 4 (four) times daily.   OMEPRAZOLE (PRILOSEC) 20 MG CAPSULE    Take 1 capsule (20 mg total) by mouth daily.   OXYCODONE-ACETAMINOPHEN (PERCOCET) 10-325 MG TABLET    Take 1 tablet by mouth daily as needed for pain.    PREDNISONE (STERAPRED UNI-PAK 21 TAB) 10 MG (21) TBPK TABLET    Take 1 tablet (10 mg total) by mouth daily.   PROPRANOLOL ER (INDERAL LA) 160 MG SR CAPSULE    Take 160 mg by mouth daily.    Review of Systems  Constitutional: Positive for chills and fatigue. Negative for fever.  HENT: Positive for congestion and rhinorrhea.   Respiratory: Positive for cough, chest tightness, shortness of breath and wheezing.   Cardiovascular: Positive for leg swelling. Negative for chest pain and palpitations.  Musculoskeletal: Positive for myalgias and arthralgias.  Neurological: Positive for weakness, light-headedness and headaches. Negative for dizziness.    Social History  Substance Use Topics  . Smoking status: Former Smoker -- 1.00 packs/day for 26 years    Types: Cigarettes    Quit date: 07/26/2013  . Smokeless tobacco: Never Used  . Alcohol Use: No     Comment: Had a history of alcohol use but currently is not drinking any   Objective:   BP 160/90 mmHg  Pulse 85  Temp(Src) 98.6 F (37 C) (Oral)  Resp 18  Wt 201 lb 9.6 oz (91.445 kg)  SpO2 96%  Physical Exam  Constitutional: She appears well-developed and well-nourished. No distress.  HENT:  Head: Normocephalic and atraumatic.  Right Ear: Hearing, tympanic membrane, external ear and ear canal normal.  Left Ear: Hearing, tympanic membrane, external ear and ear canal normal.  Nose: Mucosal edema and rhinorrhea present. Right sinus exhibits no maxillary sinus tenderness and no frontal sinus tenderness. Left sinus exhibits no maxillary sinus tenderness and no frontal sinus tenderness.  Mouth/Throat: Uvula is midline, oropharynx is clear and moist and mucous  membranes are normal. No oropharyngeal exudate, posterior oropharyngeal edema or posterior oropharyngeal erythema.  Eyes: Conjunctivae are normal. Pupils are equal, round, and reactive to light. Right eye exhibits no discharge. Left eye exhibits no discharge. No scleral icterus.  Neck: Normal range of motion. Neck supple. No tracheal deviation present. No thyromegaly present.  Cardiovascular: Normal rate, regular rhythm and normal heart sounds.  Exam reveals no gallop and no friction rub.   No murmur heard. Pulmonary/Chest: Effort normal and breath sounds normal. No stridor. No respiratory distress. She has no decreased breath sounds. She has no wheezes. She has no rhonchi. She has no rales.  Lymphadenopathy:    She has no cervical adenopathy.  Skin: Skin is warm and dry. She is not diaphoretic.  Vitals reviewed.       Assessment & Plan:     1. Rheumatoid arthritis involving multiple sites, unspecified rheumatoid factor presence (HCC) Due to not being able to get her pain medication from Dr. Pearline Cables any longer I will give her oxycodone as below for one month. I will see her back in that time frame and we will sign a pain contract and get a urine specimen to check for abuse. She is to call the office in the meantime if she develops any acute issue, question or concern. - oxyCODONE (OXYCONTIN) 10 mg 12 hr tablet; Take 1 tablet (10 mg total) by mouth every 12 (twelve) hours.  Dispense: 60 tablet; Refill: 0  2. Chronic pain See above medical treatment plan. - oxyCODONE (OXYCONTIN) 10 mg 12 hr tablet; Take 1 tablet (10 mg total) by mouth every 12 (twelve) hours.  Dispense: 60 tablet; Refill: 0  3. Chronic obstructive pulmonary disease with acute exacerbation (HCC) Somehwat improved and pulse oximetry better. No wheezing on today's exam. She continues her inhalers and nebulizers. She has 2 days left of prednisone. She has completed the antibiotic. Will refer to pulmonology for follow up as directed  per hospital discharge summary. - Ambulatory referral to [place], PA-C  [employer]

## 2015-11-16 ENCOUNTER — Inpatient Hospital Stay: Payer: Medicare Other | Admitting: Physician Assistant

## 2015-12-13 ENCOUNTER — Encounter: Payer: Self-pay | Admitting: Physician Assistant

## 2015-12-13 ENCOUNTER — Ambulatory Visit (INDEPENDENT_AMBULATORY_CARE_PROVIDER_SITE_OTHER): Payer: Medicare Other | Admitting: Physician Assistant

## 2015-12-13 VITALS — BP 170/100 | HR 90 | Temp 98.3°F | Resp 16 | Wt 204.2 lb

## 2015-12-13 DIAGNOSIS — G8929 Other chronic pain: Secondary | ICD-10-CM | POA: Diagnosis not present

## 2015-12-13 DIAGNOSIS — Z7189 Other specified counseling: Secondary | ICD-10-CM | POA: Diagnosis not present

## 2015-12-13 DIAGNOSIS — M069 Rheumatoid arthritis, unspecified: Secondary | ICD-10-CM | POA: Diagnosis not present

## 2015-12-13 DIAGNOSIS — Z3040 Encounter for surveillance of contraceptives, unspecified: Secondary | ICD-10-CM

## 2015-12-13 MED ORDER — MEDROXYPROGESTERONE ACETATE 150 MG/ML IM SUSP: 150.0000 mg | INTRAMUSCULAR | Status: DC

## 2015-12-13 MED ORDER — OXYCODONE HCL 10 MG PO TABS: 10.0000 mg | ORAL_TABLET | Freq: Four times a day (QID) | ORAL | Status: DC | PRN

## 2015-12-13 NOTE — Progress Notes (Signed)
Patient: Susan Gilmore Female    DOB: [birth year]   50 y.o.   MRN: [id number] Visit Date: 12/13/2015  Today's Provider: [name], PA-C   Chief Complaint  Patient presents with  . Follow-up    Rheumatoid Arthritis and COPD   Subjective:    HPI  Susan Gilmore is here today to follow-up on rheumatoid arthritis. At last visit she was put on Oxycodone 10 MG 12 hr tablet for ER pain control x one month trial. She states that the ER pain medication seemed to take a long time for initial pain relief onset and then still only lessened the pain for a short while (only a few hours). She reports she is hurting all over her joints, epecially left shoulder, back and knees bilaterally. Reports that her feet were very swollen two days ago. She reports the Oxycodone ER 10mg  didn't help. She is followed by Marella Chimes, [place] [facility].  She is continuing to take her methotrexate and hydroxychloroquine, but has not been taking her Enbrel (which may be reason of acute pain now) due to cost of medication with her new insurance.   She is also following up on COPD with acute exacerbation. Last office visit she was referred to Pulmonology. She was suppose to see Dr. Humphrey Rolls on 11/28/15 but she missed the appointment. She has SOB, chest tightness, and wheezing chronically, but it is much improved from previous visit and from hospitalization. She does use her Advair, Singulair, Claritin, Proair inhaler, Pulmicort, Duoneb as directed.       Allergies  Allergen Reactions  . Effexor [Venlafaxine Hcl] Swelling  . Isosorbide Nitrate Other (See Comments)    Reaction: Bad headache   Previous Medications   ALBUTEROL (PROAIR HFA) 108 (90 BASE) MCG/ACT INHALER    Inhale 1-2 puffs into the lungs every 6 (six) hours as needed for wheezing or shortness of breath.   ALPRAZOLAM (XANAX) 1 MG TABLET    Take 1 mg by mouth 4 (four) times daily as needed for anxiety.    AMPHETAMINE-DEXTROAMPHETAMINE (ADDERALL XR) 30 MG 24 HR CAPSULE    Take 60 mg by mouth daily.   B-D TB SYRINGE 1CC/27GX1/2" 27G X 1/2" 1 ML MISC    USE TO INJECT METHOTREXATE WEEKLY 30 DAYS   BUDESONIDE (PULMICORT) 0.25 MG/2ML NEBULIZER SOLUTION    Take 2 mLs (0.25 mg total) by nebulization every 6 (six) hours as needed.   DULOXETINE (CYMBALTA) 60 MG CAPSULE    Take 60 mg by mouth daily.   ENBREL SURECLICK 50 MG/ML INJECTION    Inject 50 mg as directed once a week. Reported on 12/13/2015   ESZOPICLONE 3 MG TABS    Take 3 mg by mouth at bedtime as needed (sleep). Reported on 12/13/2015   FEXOFENADINE (ALLEGRA) 180 MG TABLET    Take 180 mg by mouth daily.   FLUTICASONE (FLONASE) 50 MCG/ACT NASAL SPRAY    Place 2 sprays into both nostrils daily.   FLUTICASONE-SALMETEROL (ADVAIR) 250-50 MCG/DOSE AEPB    Inhale 1 puff into the lungs 2 (two) times daily.   FOLIC ACID (FOLVITE) 1 MG TABLET    Take 1 tablet by mouth 2 (two) times daily.   HYDROCHLOROTHIAZIDE (HYDRODIURIL) 12.5 MG TABLET    Take 1 tablet by mouth daily. Reported on 12/13/2015   HYDROCODONE-HOMATROPINE (HYCODAN) 5-1.5 MG/5ML SYRUP    Take 5 mLs by mouth every 8 (eight) hours as needed for cough.  HYDROXYCHLOROQUINE (PLAQUENIL) 200 MG TABLET    TAKE 2 TABLETS BY MOUTH WITH FOOD OR MILK ONCE A DAY   IPRATROPIUM-ALBUTEROL (DUONEB) 0.5-2.5 (3) MG/3ML SOLN    Take 3 mLs by nebulization every 6 (six) hours as needed (shortness of breath).   LORATADINE (CLARITIN) 10 MG TABLET    Take 1 tablet by mouth daily.   MEDROXYPROGESTERONE (DEPO-PROVERA) 150 MG/ML INJECTION    Inject 1 mL (150 mg total) into the muscle every 3 (three) months.   MELOXICAM (MOBIC) 15 MG TABLET    Take 15 mg by mouth daily as needed for pain. Reported on 12/13/2015   MENTHOL-METHYL SALICYLATE (MUSCLE RUB) 10-15 % CREA    Apply 1 application topically as needed for muscle pain. Reported on 12/13/2015   METHOTREXATE 50 MG/2ML INJECTION    Inject 20 mg into the skin once a week.     MONTELUKAST (SINGULAIR) 10 MG TABLET    Take 1 tablet by mouth daily.   NYSTATIN (MYCOSTATIN) 100000 UNIT/ML SUSPENSION    Take 5 mLs (500,000 Units total) by mouth 4 (four) times daily.   OMEPRAZOLE (PRILOSEC) 20 MG CAPSULE    Take 1 capsule (20 mg total) by mouth daily.   OXYCODONE (OXYCONTIN) 10 MG 12 HR TABLET    Take 1 tablet (10 mg total) by mouth every 12 (twelve) hours.   OXYCODONE-ACETAMINOPHEN (PERCOCET) 10-325 MG TABLET    Take 1 tablet by mouth daily as needed for pain. Reported on 12/13/2015   PREDNISONE (STERAPRED UNI-PAK 21 TAB) 10 MG (21) TBPK TABLET    Take 1 tablet (10 mg total) by mouth daily.   PROPRANOLOL ER (INDERAL LA) 160 MG SR CAPSULE    Take 160 mg by mouth daily. Reported on 12/13/2015    Review of Systems  Constitutional: Positive for fatigue. Negative for fever and chills.  HENT: Negative for congestion, ear pain, postnasal drip, rhinorrhea, sinus pressure, sneezing, sore throat and trouble swallowing.   Respiratory: Positive for cough, chest tightness, shortness of breath and wheezing.   Cardiovascular: Positive for leg swelling (2 days ago. sometimes). Negative for chest pain and palpitations.  Gastrointestinal: Negative for nausea, vomiting and abdominal pain.  Musculoskeletal: Positive for back pain, arthralgias and gait problem (stiff on initial standing and moving). Negative for myalgias, joint swelling, neck pain and neck stiffness.  Neurological: Negative for dizziness and headaches.    Social History  Substance Use Topics  . Smoking status: Former Smoker -- 1.00 packs/day for 26 years    Types: Cigarettes    Quit date: 07/26/2013  . Smokeless tobacco: Never Used  . Alcohol Use: No     Comment: Had a history of alcohol use but currently is not drinking any   Objective:   BP 170/100 mmHg  Pulse 90  Temp(Src) 98.3 F (36.8 C) (Oral)  Resp 16  Wt 204 lb 3.2 oz (92.625 kg)  SpO2 95%  Physical Exam  Constitutional: She appears well-developed and  well-nourished. No distress.  Cardiovascular: Normal rate, regular rhythm and normal heart sounds.  Exam reveals no gallop and no friction rub.   No murmur heard. Pulmonary/Chest: Effort normal and breath sounds normal. No respiratory distress. She has no wheezes. She has no rales.  Skin: She is not diaphoretic.  Vitals reviewed.       Assessment & Plan:     1. Rheumatoid arthritis involving multiple sites, unspecified rheumatoid factor presence ([place]) Will switch from ER to IR oxycodone as below. She is to call  if she has any breakthrough pain or adverse effects to the medication. She does have a follow up with Marella Chimes, PA-C coming up next month. She states that Marella Chimes, PA-C is no longer prescribing her pain medications thus I will cover her pain medications for the time being. She has not signed a pain contract with me as of yet, but we will do this at her follow up in 3 months with her CPE. I will check a urine sample at that time as well for physical and drug screen. She agrees with this plan. - Oxycodone HCl 10 MG TABS; Take 1 tablet (10 mg total) by mouth every 6 (six) hours as needed.  Dispense: 180 tablet; Refill: 0  2. Chronic pain See above medical treatment plan. - Oxycodone HCl 10 MG TABS; Take 1 tablet (10 mg total) by mouth every 6 (six) hours as needed.  Dispense: 180 tablet; Refill: 0  3. Encounter for chronic pain management See above medical treatment plan. - Oxycodone HCl 10 MG TABS; Take 1 tablet (10 mg total) by mouth every 6 (six) hours as needed.  Dispense: 180 tablet; Refill: 0  4. Contraceptive surveillance Stable. Diagnosis pulled for medication refill. Continue current medical treatment plan. Due for injection next week. - medroxyPROGESTERone (DEPO-PROVERA) 150 MG/ML injection; Inject 1 mL (150 mg total) into the muscle every 3 (three) months.  Dispense: 1 mL; Refill: [place], PA-C  [facility]

## 2015-12-13 NOTE — Patient Instructions (Signed)

## 2015-12-20 ENCOUNTER — Ambulatory Visit: Payer: Managed Care, Other (non HMO) | Admitting: Physician Assistant

## 2015-12-21 ENCOUNTER — Ambulatory Visit: Payer: Self-pay | Admitting: Physician Assistant

## 2016-01-01 ENCOUNTER — Other Ambulatory Visit: Payer: Self-pay | Admitting: Physician Assistant

## 2016-01-01 DIAGNOSIS — J4540 Moderate persistent asthma, uncomplicated: Secondary | ICD-10-CM

## 2016-01-01 DIAGNOSIS — J4541 Moderate persistent asthma with (acute) exacerbation: Secondary | ICD-10-CM

## 2016-01-01 MED ORDER — ALBUTEROL SULFATE HFA 108 (90 BASE) MCG/ACT IN AERS: 1.0000 | INHALATION_SPRAY | Freq: Four times a day (QID) | RESPIRATORY_TRACT | Status: DC | PRN

## 2016-01-01 MED ORDER — BUDESONIDE 0.25 MG/2ML IN SUSP: 0.2500 mg | Freq: Four times a day (QID) | RESPIRATORY_TRACT | Status: DC | PRN

## 2016-01-06 ENCOUNTER — Emergency Department
Admission: EM | Admit: 2016-01-06 | Discharge: 2016-01-07 | Disposition: A | Discharge status: Home / Self Care | Payer: Medicare Other | Attending: Emergency Medicine | Admitting: Emergency Medicine

## 2016-01-06 DIAGNOSIS — F909 Attention-deficit hyperactivity disorder, unspecified type: Secondary | ICD-10-CM | POA: Diagnosis not present

## 2016-01-06 DIAGNOSIS — J441 Chronic obstructive pulmonary disease with (acute) exacerbation: Secondary | ICD-10-CM | POA: Diagnosis not present

## 2016-01-06 DIAGNOSIS — I10 Essential (primary) hypertension: Secondary | ICD-10-CM | POA: Diagnosis not present

## 2016-01-06 DIAGNOSIS — Z87891 Personal history of nicotine dependence: Secondary | ICD-10-CM | POA: Diagnosis not present

## 2016-01-06 DIAGNOSIS — Z79899 Other long term (current) drug therapy: Secondary | ICD-10-CM | POA: Insufficient documentation

## 2016-01-06 DIAGNOSIS — Z7951 Long term (current) use of inhaled steroids: Secondary | ICD-10-CM | POA: Insufficient documentation

## 2016-01-06 DIAGNOSIS — J45909 Unspecified asthma, uncomplicated: Secondary | ICD-10-CM | POA: Diagnosis not present

## 2016-01-06 DIAGNOSIS — F319 Bipolar disorder, unspecified: Secondary | ICD-10-CM | POA: Diagnosis not present

## 2016-01-06 DIAGNOSIS — R0602 Shortness of breath: Secondary | ICD-10-CM | POA: Diagnosis present

## 2016-01-06 DIAGNOSIS — R0600 Dyspnea, unspecified: Secondary | ICD-10-CM

## 2016-01-06 LAB — BASIC METABOLIC PANEL
Anion gap: 8 (ref 5–15)
BUN: 12 mg/dL (ref 6–20)
CHLORIDE: 106 mmol/L (ref 101–111)
CO2: 27 mmol/L (ref 22–32)
Calcium: 9.3 mg/dL (ref 8.9–10.3)
Creatinine, Ser: 0.72 mg/dL (ref 0.44–1.00)
Glucose, Bld: 80 mg/dL (ref 65–99)
POTASSIUM: 3.9 mmol/L (ref 3.5–5.1)
Sodium: 141 mmol/L (ref 135–145)

## 2016-01-06 LAB — CBC
HEMATOCRIT: 36.2 % (ref 35.0–47.0)
Hemoglobin: 12.4 g/dL (ref 12.0–16.0)
MCH: 29.8 pg (ref 26.0–34.0)
MCHC: 34.4 g/dL (ref 32.0–36.0)
MCV: 86.7 fL (ref 80.0–100.0)
PLATELETS: 276 10*3/uL (ref 150–440)
RBC: 4.17 MIL/uL (ref 3.80–5.20)
RDW: 16.6 % — AB (ref 11.5–14.5)
WBC: 10.2 10*3/uL (ref 3.6–11.0)

## 2016-01-06 MED ORDER — ALBUTEROL SULFATE (2.5 MG/3ML) 0.083% IN NEBU
5.0000 mg | INHALATION_SOLUTION | Freq: Once | RESPIRATORY_TRACT | Status: AC
  Administered 2016-01-06: 5 mg via RESPIRATORY_TRACT
  Filled 2016-01-06: qty 6

## 2016-01-06 NOTE — ED Notes (Signed)
Pt reports started on Thursday with pain under her left arm that moved to her right side and is now under both her ribs. Pt reports hx of copd and states she used a neb of albuterol about 45 mins ago.

## 2016-01-07 ENCOUNTER — Emergency Department: Payer: Medicare Other

## 2016-01-07 LAB — TROPONIN I

## 2016-01-07 MED ORDER — PREDNISONE 20 MG PO TABS: 40.0000 mg | ORAL_TABLET | Freq: Every day | ORAL | Status: DC

## 2016-01-07 MED ORDER — METHYLPREDNISOLONE SODIUM SUCC 125 MG IJ SOLR
125.0000 mg | Freq: Once | INTRAMUSCULAR | Status: AC
  Administered 2016-01-07: 125 mg via INTRAVENOUS
  Filled 2016-01-07: qty 2

## 2016-01-07 MED ORDER — ONDANSETRON HCL 4 MG/2ML IJ SOLN
4.0000 mg | Freq: Once | INTRAMUSCULAR | Status: AC
  Administered 2016-01-07: 4 mg via INTRAVENOUS
  Filled 2016-01-07: qty 2

## 2016-01-07 MED ORDER — OXYCODONE-ACETAMINOPHEN 5-325 MG PO TABS
1.0000 | ORAL_TABLET | Freq: Once | ORAL | Status: AC
  Administered 2016-01-07: 1 via ORAL
  Filled 2016-01-07: qty 1

## 2016-01-07 MED ORDER — MORPHINE SULFATE (PF) 4 MG/ML IV SOLN
4.0000 mg | Freq: Once | INTRAVENOUS | Status: AC
  Administered 2016-01-07: 4 mg via INTRAVENOUS
  Filled 2016-01-07: qty 1

## 2016-01-07 NOTE — ED Notes (Signed)
Patient transported to X-ray via [employer].

## 2016-01-07 NOTE — ED Notes (Signed)
Pt returned from xray

## 2016-01-07 NOTE — ED Provider Notes (Signed)
[facility] Emergency Department Provider Note  Time seen: 12:34 AM  I have reviewed the triage vital signs and the nursing notes.   HISTORY  Chief Complaint Shortness of Breath    HPI Susan Gilmore is a 50 y.o. female with a past medical history of COPD, hypertension, bipolar, presents the emergency department with difficulty breathing and chest discomfort. According to the patient since this morning (greater than 12 hours) the patient has been experiencing left-sided chest discomfort which she describes as a dull aching pain as well as shortness of breath. She has a history of COPD and took a breathing treatment at home. States she did not feel much improvement so she came to the emergency department for evaluation. Patient denies any cough, fever, or sputum production. Denies any abdominal pain. States the chest pain has been constant all day, denies any worsening with exertion. Patient does state chest pain is somewhat worse when she coughs. States her cough is normal, denies any increased cough.     Past Medical History  Diagnosis Date  . Chest pain   . Hyperplastic colon polyp   . Allergic rhinitis   . Palpitations   . HPV (human papilloma virus) anogenital infection   . Contraception   . Cough   . Abnormal stress test   . Carpal tunnel syndrome   . Chronic anxiety   . Insomnia   . Pneumonia   . Bipolar disorder ([place])   . HTN (hypertension)   . Abnormal Pap smear 04/28/2012    normal pap /positive hpv     Patient Active Problem List   Diagnosis Date Noted  . Encounter for chronic pain management 12/13/2015  . Chronic pain 12/13/2015  . Respiratory failure ([employer]) 11/08/2015  . Asthma 06/05/2015  . Rheumatoid arthritis ([place]) 06/05/2015  . 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) 06/05/2015  . Allergic rhinitis 03/15/2015  . ADD (attention deficit disorder) 12/29/2014  . 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 Back ache 12/29/2014  .  Bipolar disorder ([place]) 12/29/2014  . Carpal tunnel syndrome 12/29/2014  . Chest pain 12/29/2014  . Chronic anxiety 12/29/2014  . Elevated blood sugar 12/29/2014  . Elevated rheumatoid factor 12/29/2014  . Acid reflux 12/29/2014  . Infectious human wart virus 12/29/2014  . Benign neoplasm of colon 12/29/2014  . Cephalalgia 12/29/2014  . Cannot sleep 12/29/2014  . Awareness of heartbeats 12/29/2014  . Flutter-fibrillation 12/29/2014  . LAD (lymphadenopathy), retroperitoneal 12/29/2014  . Fast heart beat 12/29/2014  . Systolic dysfunction [id number]  . Avitaminosis D 12/29/2014  . Paroxysmal digital cyanosis 02/17/2014  . Ventricular bigeminy 01/14/2012  . Hypertension 01/14/2012  . Abnormal EKG 01/14/2012  . Tobacco abuse 01/14/2012  . Benign essential HTN 12/09/2003    Past Surgical History  Procedure Laterality Date  . Ganglion cyst excision    . Wisdom tooth extraction    . Full mouth dental      Current Outpatient Rx  Name  Route  Sig  Dispense  Refill  . albuterol (PROAIR HFA) 108 (90 Base) MCG/ACT inhaler   Inhalation   Inhale 1-2 puffs into the lungs every 6 (six) hours as needed for wheezing or shortness of breath.   18 g   11   . ALPRAZolam (XANAX) 1 MG tablet   Oral   Take 1 mg by mouth 4 (four) times daily as needed for anxiety.          [name] amphetamine-dextroamphetamine (ADDERALL XR) 30 MG 24 hr capsule  Oral   Take 60 mg by mouth daily.      0   . B-D TB SYRINGE 1CC/27GX1/2" 27G X 1/2" 1 ML MISC      USE TO INJECT METHOTREXATE WEEKLY 30 DAYS      3     Dispense as written.   . budesonide (PULMICORT) 0.25 MG/2ML nebulizer solution   Nebulization   Take 2 mLs (0.25 mg total) by nebulization every 6 (six) hours as needed (for shortness of breath).   60 mL   12   . DULoxetine (CYMBALTA) 60 MG capsule   Oral   Take 60 mg by mouth daily.         . Eszopiclone 3 MG TABS   Oral   Take 3 mg by mouth at bedtime as needed (sleep). Reported on  12/13/2015         . fexofenadine (ALLEGRA) 180 MG tablet   Oral   Take 180 mg by mouth daily.         . fluticasone (FLONASE) 50 MCG/ACT nasal spray   Each Nare   Place 2 sprays into both nostrils daily.   16 g   11   . Fluticasone-Salmeterol (ADVAIR) 250-50 MCG/DOSE AEPB   Inhalation   Inhale 1 puff into the lungs 2 (two) times daily.         . folic acid (FOLVITE) 1 MG tablet   Oral   Take 1 tablet by mouth 2 (two) times daily.         . hydrochlorothiazide (HYDRODIURIL) 12.5 MG tablet   Oral   Take 1 tablet by mouth daily. Reported on 12/13/2015         . hydroxychloroquine (PLAQUENIL) 200 MG tablet      TAKE 2 TABLETS BY MOUTH WITH FOOD OR MILK ONCE A DAY      2   . ipratropium-albuterol (DUONEB) 0.5-2.5 (3) MG/3ML SOLN   Nebulization   Take 3 mLs by nebulization every 6 (six) hours as needed (shortness of breath).         . loratadine (CLARITIN) 10 MG tablet   Oral   Take 1 tablet by mouth daily.         . medroxyPROGESTERone (DEPO-PROVERA) 150 MG/ML injection   Intramuscular   Inject 1 mL (150 mg total) into the muscle every 3 (three) months.   1 mL   4   . Menthol-Methyl Salicylate (MUSCLE RUB) 10-15 % CREA   Topical   Apply 1 application topically as needed for muscle pain. Reported on 12/13/2015         . methotrexate 50 MG/2ML injection   Subcutaneous   Inject 20 mg into the skin once a week.          . montelukast (SINGULAIR) 10 MG tablet   Oral   Take 1 tablet by mouth daily.         [name] nystatin (MYCOSTATIN) 100000 UNIT/ML suspension   Oral   Take 5 mLs (500,000 Units total) by mouth 4 (four) times daily.   473 mL   0   . omeprazole (PRILOSEC) 20 MG capsule   Oral   Take 1 capsule (20 mg total) by mouth daily.   30 capsule   11   . Oxycodone HCl 10 MG TABS   Oral   Take 1 tablet (10 mg total) by mouth every 6 (six) hours as needed.   180 tablet   0   . oxyCODONE-acetaminophen (PERCOCET) 10-325 MG  tablet   Oral    Take 1 tablet by mouth daily as needed for pain. Reported on 12/13/2015      0   . propranolol ER (INDERAL LA) 160 MG SR capsule   Oral   Take 160 mg by mouth daily. Reported on 12/13/2015           Allergies Effexor and Isosorbide nitrate  Family History  Problem Relation Age of Onset  . Stroke Paternal Grandmother   . Heart disease Paternal Grandmother   . Diabetes Maternal Grandmother   . Heart disease Maternal Grandmother   . Hypertension Maternal Grandmother   . Heart disease Maternal Grandfather   . Hypertension Maternal Grandfather   . Heart disease Father   . Stroke Father   . Hypertension Father   . Hyperlipidemia Father   . Diabetes Mother   . Heart attack Mother     Social History Social History  Substance Use Topics  . Smoking status: Former Smoker -- 1.00 packs/day for 26 years    Types: Cigarettes    Quit date: 07/26/2013  . Smokeless tobacco: Never Used  . Alcohol Use: No     Comment: Had a history of alcohol use but currently is not drinking any    Review of Systems Constitutional: Negative for fever. Cardiovascular: Dull left chest discomfort Respiratory: Positive for shortness of breath Gastrointestinal: Negative for abdominal pain, vomiting and diarrhea. Musculoskeletal: Negative for back pain. Neurological: Negative for headache 10-point ROS otherwise negative.  ____________________________________________   PHYSICAL EXAM:  VITAL SIGNS: ED Triage Vitals  Enc Vitals Group     BP 01/06/16 2254 154/94 mmHg     Pulse Rate 01/06/16 2254 101     Resp 01/06/16 2254 36     Temp 01/06/16 2254 98.4 F (36.9 C)     Temp src --      SpO2 01/06/16 2254 100 %     Weight 01/06/16 2254 203 lb (92.08 kg)     Height 01/06/16 2254 5\' 1"  (1.549 m)     Head Cir --      Peak Flow --      Pain Score --      Pain Loc --      Pain Edu? --      Excl. in [place]? --     Constitutional: Alert and oriented. Well appearing and in no distress. Eyes: Normal  exam ENT   Head: Normocephalic and atraumatic   Mouth/Throat: Mucous membranes are moist. Cardiovascular: Normal rate, regular rhythm. No murmur Respiratory: Normal respiratory effort without tachypnea nor retractions. Breath sounds are clear with good air movement bilaterally. No wheeze, rales or rhonchi (status post 2 breathing treatments). Gastrointestinal: Soft and nontender. No distention.  Musculoskeletal: Nontender with normal range of motion in all extremities. No lower extremity tenderness or edema. Neurologic:  Normal speech and language. No gross focal neurologic deficits Skin:  Skin is warm, dry and intact.  Psychiatric: Mood and affect are normal.   ____________________________________________    EKG  EKG reviewed and interpreted by myself shows normal sinus rhythm at 96 bpm, narrow QRS, normal axis, normal intervals, inferior and lateral T-wave inversions, largely unchanged from prior EKGs.  ____________________________________________    RADIOLOGY  Chest x-ray shows no acute disease.  ____________________________________________   INITIAL IMPRESSION / ASSESSMENT AND PLAN / ED COURSE  Pertinent labs & imaging results that were available during my care of the patient were reviewed by me and considered in my medical decision making (  see chart for details).  Patient presents the emergency department with difficulty breathing and a mild left-sided chest discomfort. Patient states a history of COPD, states it feels like her COPD is flaring up. Patient does not smoke. Patient has had 2 breathing treatments in triage, patient sounds clear without wheeze in good air movement bilaterally. Patient states she is feeling somewhat better after the breathing treatments. Currently awaiting troponin and chest x-ray results. We'll treat the patient's discomfort and dose Solu-Medrol. Patient agreeable to plan.  Chest x-ray negative. EKG is unchanged. Labs are within normal  limits including negative troponin. I discussed with the patient likely COPD exacerbation. We will discharge the patient on a burst course of steroids. Patient agreeable to plan.  ____________________________________________   FINAL CLINICAL IMPRESSION(S) / ED DIAGNOSES  COPD exacerbation Dyspnea   Harvest Dark, MD 01/07/16 [phone]

## 2016-01-07 NOTE — Discharge Instructions (Signed)

## 2016-01-08 ENCOUNTER — Telehealth: Payer: Self-pay | Admitting: Physician Assistant

## 2016-01-08 DIAGNOSIS — M069 Rheumatoid arthritis, unspecified: Secondary | ICD-10-CM

## 2016-01-08 DIAGNOSIS — G8929 Other chronic pain: Secondary | ICD-10-CM

## 2016-01-08 NOTE — Telephone Encounter (Signed)
Pt contacted office for refill request on the following medications: Oxycodone HCl 10 MG TABS. Last written: 12/13/15 Last OV: 12/13/15 Please advise. Thanks TNP

## 2016-01-09 MED ORDER — OXYCODONE HCL 10 MG PO TABS
10.0000 mg | ORAL_TABLET | Freq: Four times a day (QID) | ORAL | Status: DC | PRN
Start: 2016-01-09 — End: 2016-03-06

## 2016-01-09 NOTE — Telephone Encounter (Signed)
Left message that prescription is up front ready for pick up.  Thanks,  -Joseline

## 2016-01-09 NOTE — Telephone Encounter (Signed)
Rx printed

## 2016-01-31 ENCOUNTER — Telehealth: Payer: Self-pay | Admitting: Physician Assistant

## 2016-02-22 ENCOUNTER — Other Ambulatory Visit: Payer: Self-pay | Admitting: Physician Assistant

## 2016-02-22 DIAGNOSIS — I10 Essential (primary) hypertension: Secondary | ICD-10-CM

## 2016-02-22 MED ORDER — PROPRANOLOL HCL ER 160 MG PO CP24: 160.0000 mg | ORAL_CAPSULE | Freq: Every day | ORAL | Status: DC

## 2016-03-06 ENCOUNTER — Encounter: Payer: Self-pay | Admitting: Physician Assistant

## 2016-03-06 ENCOUNTER — Ambulatory Visit (INDEPENDENT_AMBULATORY_CARE_PROVIDER_SITE_OTHER): Payer: Medicare Other | Admitting: Physician Assistant

## 2016-03-06 DIAGNOSIS — Z1239 Encounter for other screening for malignant neoplasm of breast: Secondary | ICD-10-CM | POA: Diagnosis not present

## 2016-03-06 DIAGNOSIS — G8929 Other chronic pain: Secondary | ICD-10-CM

## 2016-03-06 DIAGNOSIS — Z7189 Other specified counseling: Secondary | ICD-10-CM | POA: Diagnosis not present

## 2016-03-06 DIAGNOSIS — Z3042 Encounter for surveillance of injectable contraceptive: Secondary | ICD-10-CM | POA: Diagnosis not present

## 2016-03-06 DIAGNOSIS — M069 Rheumatoid arthritis, unspecified: Secondary | ICD-10-CM | POA: Diagnosis not present

## 2016-03-06 DIAGNOSIS — Z0000 Encounter for general adult medical examination without abnormal findings: Secondary | ICD-10-CM | POA: Diagnosis not present

## 2016-03-06 LAB — POCT URINE PREGNANCY: Preg Test, Ur: NEGATIVE

## 2016-03-06 MED ORDER — MEDROXYPROGESTERONE ACETATE 150 MG/ML IM SUSP
150.0000 mg | Freq: Once | INTRAMUSCULAR | Status: AC
  Administered 2016-03-06: 150 mg via INTRAMUSCULAR

## 2016-03-06 MED ORDER — OXYCODONE HCL 10 MG PO TABS: 10.0000 mg | ORAL_TABLET | Freq: Four times a day (QID) | ORAL | Status: DC | PRN

## 2016-03-06 NOTE — Patient Instructions (Signed)
Health Maintenance, Female Adopting a healthy lifestyle and getting preventive care can go a long way to promote health and wellness. Talk with your health care provider about what schedule of regular examinations is right for you. This is a good chance for you to check in with your provider about disease prevention and staying healthy. In between checkups, there are plenty of things you can do on your own. Experts have done a lot of research about which lifestyle changes and preventive measures are most likely to keep you healthy. Ask your health care provider for more information. WEIGHT AND DIET  Eat a healthy diet  Be sure to include plenty of vegetables, fruits, low-fat dairy products, and lean protein.  Do not eat a lot of foods high in solid fats, added sugars, or salt.  Get regular exercise. This is one of the most important things you can do for your health.  Most adults should exercise for at least 150 minutes each week. The exercise should increase your heart rate and make you sweat (moderate-intensity exercise).  Most adults should also do strengthening exercises at least twice a week. This is in addition to the moderate-intensity exercise.  Maintain a healthy weight  Body mass index (BMI) is a measurement that can be used to identify possible weight problems. It estimates body fat based on height and weight. Your health care provider can help determine your BMI and help you achieve or maintain a healthy weight.  For females 20 years of age and older:   A BMI below 18.5 is considered underweight.  A BMI of 18.5 to 24.9 is normal.  A BMI of 25 to 29.9 is considered overweight.  A BMI of 30 and above is considered obese.  Watch levels of cholesterol and blood lipids  You should start having your blood tested for lipids and cholesterol at 48 years of age, then have this test every 5 years.  You may need to have your cholesterol levels checked more often if:  Your lipid  or cholesterol levels are high.  You are older than 48 years of age.  You are at high risk for heart disease.  CANCER SCREENING   Lung Cancer  Lung cancer screening is recommended for adults 55-80 years old who are at high risk for lung cancer because of a history of smoking.  A yearly low-dose CT scan of the lungs is recommended for people who:  Currently smoke.  Have quit within the past 15 years.  Have at least a 30-pack-year history of smoking. A pack year is smoking an average of one pack of cigarettes a day for 1 year.  Yearly screening should continue until it has been 15 years since you quit.  Yearly screening should stop if you develop a health problem that would prevent you from having lung cancer treatment.  Breast Cancer  Practice breast self-awareness. This means understanding how your breasts normally appear and feel.  It also means doing regular breast self-exams. Let your health care provider know about any changes, no matter how small.  If you are in your 20s or 30s, you should have a clinical breast exam (CBE) by a health care provider every 1-3 years as part of a regular health exam.  If you are 40 or older, have a CBE every year. Also consider having a breast X-ray (mammogram) every year.  If you have a family history of breast cancer, talk to your health care provider about genetic screening.  If you   are at high risk for breast cancer, talk to your health care provider about having an MRI and a mammogram every year.  Breast cancer gene (BRCA) assessment is recommended for women who have family members with BRCA-related cancers. BRCA-related cancers include:  Breast.  Ovarian.  Tubal.  Peritoneal cancers.  Results of the assessment will determine the need for genetic counseling and BRCA1 and BRCA2 testing. Cervical Cancer Your health care provider may recommend that you be screened regularly for cancer of the pelvic organs (ovaries, uterus, and  vagina). This screening involves a pelvic examination, including checking for microscopic changes to the surface of your cervix (Pap test). You may be encouraged to have this screening done every 3 years, beginning at age 21.  For women ages 30-65, health care providers may recommend pelvic exams and Pap testing every 3 years, or they may recommend the Pap and pelvic exam, combined with testing for human papilloma virus (HPV), every 5 years. Some types of HPV increase your risk of cervical cancer. Testing for HPV may also be done on women of any age with unclear Pap test results.  Other health care providers may not recommend any screening for nonpregnant women who are considered low risk for pelvic cancer and who do not have symptoms. Ask your health care provider if a screening pelvic exam is right for you.  If you have had past treatment for cervical cancer or a condition that could lead to cancer, you need Pap tests and screening for cancer for at least 20 years after your treatment. If Pap tests have been discontinued, your risk factors (such as having a new sexual partner) need to be reassessed to determine if screening should resume. Some women have medical problems that increase the chance of getting cervical cancer. In these cases, your health care provider may recommend more frequent screening and Pap tests. Colorectal Cancer  This type of cancer can be detected and often prevented.  Routine colorectal cancer screening usually begins at 48 years of age and continues through 48 years of age.  Your health care provider may recommend screening at an earlier age if you have risk factors for colon cancer.  Your health care provider may also recommend using home test kits to check for hidden blood in the stool.  A small camera at the end of a tube can be used to examine your colon directly (sigmoidoscopy or colonoscopy). This is done to check for the earliest forms of colorectal  cancer.  Routine screening usually begins at age 50.  Direct examination of the colon should be repeated every 5-10 years through 48 years of age. However, you may need to be screened more often if early forms of precancerous polyps or small growths are found. Skin Cancer  Check your skin from head to toe regularly.  Tell your health care provider about any new moles or changes in moles, especially if there is a change in a mole's shape or color.  Also tell your health care provider if you have a mole that is larger than the size of a pencil eraser.  Always use sunscreen. Apply sunscreen liberally and repeatedly throughout the day.  Protect yourself by wearing long sleeves, pants, a wide-brimmed hat, and sunglasses whenever you are outside. HEART DISEASE, DIABETES, AND HIGH BLOOD PRESSURE   High blood pressure causes heart disease and increases the risk of stroke. High blood pressure is more likely to develop in:  People who have blood pressure in the high end   of the normal range (130-139/85-89 mm Hg).  People who are overweight or obese.  People who are African American.  If you are 38-23 years of age, have your blood pressure checked every 3-5 years. If you are 61 years of age or older, have your blood pressure checked every year. You should have your blood pressure measured twice--once when you are at a hospital or clinic, and once when you are not at a hospital or clinic. Record the average of the two measurements. To check your blood pressure when you are not at a hospital or clinic, you can use:  An automated blood pressure machine at a pharmacy.  A home blood pressure monitor.  If you are between 45 years and 39 years old, ask your health care provider if you should take aspirin to prevent strokes.  Have regular diabetes screenings. This involves taking a blood sample to check your fasting blood sugar level.  If you are at a normal weight and have a low risk for diabetes,  have this test once every three years after 48 years of age.  If you are overweight and have a high risk for diabetes, consider being tested at a younger age or more often. PREVENTING INFECTION  Hepatitis B  If you have a higher risk for hepatitis B, you should be screened for this virus. You are considered at high risk for hepatitis B if:  You were born in a country where hepatitis B is common. Ask your health care provider which countries are considered high risk.  Your parents were born in a high-risk country, and you have not been immunized against hepatitis B (hepatitis B vaccine).  You have HIV or AIDS.  You use needles to inject street drugs.  You live with someone who has hepatitis B.  You have had sex with someone who has hepatitis B.  You get hemodialysis treatment.  You take certain medicines for conditions, including cancer, organ transplantation, and autoimmune conditions. Hepatitis C  Blood testing is recommended for:  Everyone born from [birth year] through 1963.  Anyone with known risk factors for hepatitis C. Sexually transmitted infections (STIs)  You should be screened for sexually transmitted infections (STIs) including gonorrhea and chlamydia if:  You are sexually active and are younger than 48 years of age.  You are older than 48 years of age and your health care provider tells you that you are at risk for this type of infection.  Your sexual activity has changed since you were last screened and you are at an increased risk for chlamydia or gonorrhea. Ask your health care provider if you are at risk.  If you do not have HIV, but are at risk, it may be recommended that you take a prescription medicine daily to prevent HIV infection. This is called pre-exposure prophylaxis (PrEP). You are considered at risk if:  You are sexually active and do not regularly use condoms or know the HIV status of your partner(s).  You take drugs by injection.  You are sexually  active with a partner who has HIV. Talk with your health care provider about whether you are at high risk of being infected with HIV. If you choose to begin PrEP, you should first be tested for HIV. You should then be tested every 3 months for as long as you are taking PrEP.  PREGNANCY   If you are premenopausal and you may become pregnant, ask your health care provider about preconception counseling.  If you may  become pregnant, take 400 to 800 micrograms (mcg) of folic acid every day.  If you want to prevent pregnancy, talk to your health care provider about birth control (contraception). OSTEOPOROSIS AND MENOPAUSE   Osteoporosis is a disease in which the bones lose minerals and strength with aging. This can result in serious bone fractures. Your risk for osteoporosis can be identified using a bone density scan.  If you are 61 years of age or older, or if you are at risk for osteoporosis and fractures, ask your health care provider if you should be screened.  Ask your health care provider whether you should take a calcium or vitamin D supplement to lower your risk for osteoporosis.  Menopause may have certain physical symptoms and risks.  Hormone replacement therapy may reduce some of these symptoms and risks. Talk to your health care provider about whether hormone replacement therapy is right for you.  HOME CARE INSTRUCTIONS   Schedule regular health, dental, and eye exams.  Stay current with your immunizations.   Do not use any tobacco products including cigarettes, chewing tobacco, or electronic cigarettes.  If you are pregnant, do not drink alcohol.  If you are breastfeeding, limit how much and how often you drink alcohol.  Limit alcohol intake to no more than 1 drink per day for nonpregnant women. One drink equals 12 ounces of beer, 5 ounces of wine, or 1 ounces of hard liquor.  Do not use street drugs.  Do not share needles.  Ask your health care provider for help if  you need support or information about quitting drugs.  Tell your health care provider if you often feel depressed.  Tell your health care provider if you have ever been abused or do not feel safe at home.   This information is not intended to replace advice given to you by your health care provider. Make sure you discuss any questions you have with your health care provider.   Document Released: 02/25/2011 Document Revised: 09/02/2014 Document Reviewed: 07/14/2013 Elsevier Interactive Patient Education [employer].

## 2016-03-06 NOTE — Addendum Note (Signed)
Addended by: [name] on: 03/06/2016 03:24 PM   Modules accepted: Miquel Dunn

## 2016-03-06 NOTE — Progress Notes (Signed)
Patient: Susan Gilmore, Female    DOB: 1966/02/08, 50 y.o.   MRN: [id number] Visit Date: 03/06/2016  Today's Provider: [name], PA-C   Chief Complaint  Patient presents with  . Annual Exam   Subjective:    Annual physical exam Susan Gilmore is a 50 y.o. female who presents today for health maintenance and complete physical. She feels fairly well. She reports exercising none. She reports she is sleeping fairly well.  She is scheduled to see her RA doctor this afternoon. Feels it is doing well today. Needs her depo-provera injection for her methotrexate. Still using Oxy IR 10mg  as needed for severe pain. -----------------------------------------------------------------   Review of Systems  Constitutional: Negative for fever, chills and fatigue.  HENT: Negative.   Eyes: Positive for itching.  Respiratory: Positive for cough, chest tightness, shortness of breath and wheezing.   Cardiovascular: Negative for chest pain, palpitations and leg swelling.  Gastrointestinal: Negative.   Endocrine: Positive for heat intolerance and polydipsia.  Genitourinary: Negative.   Musculoskeletal: Positive for myalgias, back pain, joint swelling, arthralgias, neck pain and neck stiffness.  Skin: Negative.   Allergic/Immunologic: Negative.   Neurological: Positive for weakness and light-headedness. Negative for dizziness, syncope, numbness and headaches.  Hematological: Negative.   Psychiatric/Behavioral: Negative.     Social History      She  reports that she quit smoking about 2 years ago. Her smoking use included Cigarettes. She has a 26 pack-year smoking history. She has never used smokeless tobacco. She reports that she does not drink alcohol or use illicit drugs.       Social History   Social History  . Marital Status: Married    Spouse Name: N/A  . Number of Children: N/A  . Years of Education: N/A   Social History Main Topics  . Smoking status: Former Smoker  -- 1.00 packs/day for 26 years    Types: Cigarettes    Quit date: 07/26/2013  . Smokeless tobacco: Never Used  . Alcohol Use: No     Comment: Had a history of alcohol use but currently is not drinking any  . Drug Use: No  . Sexual Activity: Yes    Birth Control/ Protection: Injection     Comment: depo   Other Topics Concern  . None   Social History Narrative   The patient is married. She has one stepson. She does not work outside the home. She smokes 1-1/2-2 packs of cigarettes daily.    Past Medical History  Diagnosis Date  . Chest pain   . Hyperplastic colon polyp   . Allergic rhinitis   . Palpitations   . HPV (human papilloma virus) anogenital infection   . Contraception   . Cough   . Abnormal stress test   . Carpal tunnel syndrome   . Chronic anxiety   . Insomnia   . Pneumonia   . Bipolar disorder ([place])   . HTN (hypertension)   . Abnormal Pap smear 04/28/2012    normal pap /positive hpv      Patient Active Problem List   Diagnosis Date Noted  . Encounter for chronic pain management 12/13/2015  . Chronic pain 12/13/2015  . Respiratory failure ([place]) 11/08/2015  . Asthma 06/05/2015  . Rheumatoid arthritis ([place]) 06/05/2015  . 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 Allergic rhinitis 03/15/2015  . ADD (attention deficit disorder) 12/29/2014  . 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 Back ache 12/29/2014  .  Bipolar disorder ([place]) 12/29/2014  . Carpal tunnel syndrome 12/29/2014  . Chest pain 12/29/2014  . Chronic anxiety 12/29/2014  . Elevated blood sugar 12/29/2014  . Elevated rheumatoid factor 12/29/2014  . Acid reflux 12/29/2014  . Infectious human wart virus 12/29/2014  . Benign neoplasm of colon 12/29/2014  . Cephalalgia 12/29/2014  . Cannot sleep 12/29/2014  . Awareness of heartbeats 12/29/2014  . Flutter-fibrillation 12/29/2014  . LAD (lymphadenopathy), retroperitoneal 12/29/2014  . Fast heart beat 12/29/2014  . Systolic  dysfunction [id number]  . Avitaminosis D 12/29/2014  . Paroxysmal digital cyanosis 02/17/2014  . Ventricular bigeminy 01/14/2012  . Hypertension 01/14/2012  . Abnormal EKG 01/14/2012  . Tobacco abuse 01/14/2012  . Benign essential HTN 12/09/2003    Past Surgical History  Procedure Laterality Date  . Ganglion cyst excision    . Wisdom tooth extraction    . Full mouth dental      Family History        Family Status  Relation Status Death Age  . Paternal Grandmother Deceased   . Maternal Grandmother Deceased   . Maternal Grandfather Deceased   . Father Deceased   . Mother Deceased   . Paternal Grandfather Deceased         Her family history includes Diabetes in her maternal grandmother and mother; Heart attack in her mother; Heart disease in her father, maternal grandfather, maternal grandmother, and paternal grandmother; Hyperlipidemia in her father; Hypertension in her father, maternal grandfather, and maternal grandmother; Stroke in her father and paternal grandmother.    Allergies  Allergen Reactions  . Effexor [Venlafaxine Hcl] Swelling  . Isosorbide Nitrate Other (See Comments)    Reaction: Bad headache    Current Meds  Medication Sig  . albuterol (PROAIR HFA) 108 (90 Base) MCG/ACT inhaler Inhale 1-2 puffs into the lungs every 6 (six) hours as needed for wheezing or shortness of breath.  . ALPRAZolam (XANAX) 1 MG tablet Take 1 mg by mouth 4 (four) times daily as needed for anxiety.   [name] amphetamine-dextroamphetamine (ADDERALL XR) 30 MG 24 hr capsule Take 60 mg by mouth daily.  . B-D TB SYRINGE 1CC/27GX1/2" 27G X 1/2" 1 ML MISC USE TO INJECT METHOTREXATE WEEKLY 30 DAYS  . budesonide (PULMICORT) 0.25 MG/2ML nebulizer solution Take 2 mLs (0.25 mg total) by nebulization every 6 (six) hours as needed (for shortness of breath).  . DULoxetine (CYMBALTA) 60 MG capsule Take 60 mg by mouth daily.  . Eszopiclone 3 MG TABS Take 3 mg by mouth at bedtime as needed (sleep). Reported  on 12/13/2015  . fexofenadine (ALLEGRA) 180 MG tablet Take 180 mg by mouth daily.  . fluticasone (FLONASE) 50 MCG/ACT nasal spray Place 2 sprays into both nostrils daily.  . Fluticasone-Salmeterol (ADVAIR) 250-50 MCG/DOSE AEPB Inhale 1 puff into the lungs 2 (two) times daily.  . folic acid (FOLVITE) 1 MG tablet Take 1 tablet by mouth 2 (two) times daily.  . hydrochlorothiazide (HYDRODIURIL) 12.5 MG tablet Take 1 tablet by mouth daily. Reported on 12/13/2015  . hydroxychloroquine (PLAQUENIL) 200 MG tablet TAKE 2 TABLETS BY MOUTH WITH FOOD OR MILK ONCE A DAY  . ipratropium-albuterol (DUONEB) 0.5-2.5 (3) MG/3ML SOLN Take 3 mLs by nebulization every 6 (six) hours as needed (shortness of breath).  . loratadine (CLARITIN) 10 MG tablet Take 1 tablet by mouth daily.  . medroxyPROGESTERone (DEPO-PROVERA) 150 MG/ML injection Inject 1 mL (150 mg total) into the muscle every 3 (three) months.  . Menthol-Methyl Salicylate (MUSCLE RUB) 10-15 %  CREA Apply 1 application topically as needed for muscle pain. Reported on 12/13/2015  . methotrexate 50 MG/2ML injection Inject 20 mg into the skin once a week.   . montelukast (SINGULAIR) 10 MG tablet Take 1 tablet by mouth daily.  [name] nystatin (MYCOSTATIN) 100000 UNIT/ML suspension Take 5 mLs (500,000 Units total) by mouth 4 (four) times daily.  [name] omeprazole (PRILOSEC) 20 MG capsule Take 1 capsule (20 mg total) by mouth daily.  . Oxycodone HCl 10 MG TABS Take 1 tablet (10 mg total) by mouth every 6 (six) hours as needed. For severe pain  . oxyCODONE-acetaminophen (PERCOCET) 10-325 MG tablet Take 1 tablet by mouth daily as needed for pain. Reported on 12/13/2015  . propranolol ER (INDERAL LA) 160 MG SR capsule Take 1 capsule (160 mg total) by mouth daily. Reported on 12/13/2015    Patient Care Team: [name], PA-C as PCP - General (Physician Assistant)     Objective:   Vitals: BP 150/98 mmHg  Pulse 83  Temp(Src) 98 F (36.7 C) (Oral)  Resp 16  Ht 5\' 1"   (1.549 m)  Wt 200 lb 3.2 oz (90.81 kg)  BMI 37.85 kg/m2  SpO2 97%   Physical Exam  Constitutional: She is oriented to person, place, and time. She appears well-developed and well-nourished. No distress.  HENT:  Head: Normocephalic and atraumatic.  Right Ear: External ear normal.  Left Ear: External ear normal.  Nose: Nose normal.  Mouth/Throat: Oropharynx is clear and moist. No oropharyngeal exudate.  Eyes: Conjunctivae and EOM are normal. Pupils are equal, round, and reactive to light. Right eye exhibits no discharge. Left eye exhibits no discharge. No scleral icterus.  Neck: Normal range of motion. Neck supple. No JVD present. No tracheal deviation present. No thyromegaly present.  Cardiovascular: Normal rate, regular rhythm, normal heart sounds and intact distal pulses.  Exam reveals no gallop and no friction rub.   No murmur heard. Pulmonary/Chest: Effort normal and breath sounds normal. No respiratory distress. She has no wheezes. She has no rales. She exhibits no tenderness. Right breast exhibits no inverted nipple, no mass, no nipple discharge, no skin change and no tenderness. Left breast exhibits no inverted nipple, no mass, no nipple discharge, no skin change and no tenderness. Breasts are symmetrical.  Abdominal: Soft. Bowel sounds are normal. She exhibits no distension and no mass. There is no tenderness. There is no rebound and no guarding.  Musculoskeletal: Normal range of motion. She exhibits no edema or tenderness.  Lymphadenopathy:    She has no cervical adenopathy.  Neurological: She is alert and oriented to person, place, and time.  Skin: Skin is warm and dry. No rash noted. She is not diaphoretic.  Psychiatric: She has a normal mood and affect. Her behavior is normal. Judgment and thought content normal.  Vitals reviewed.    Depression Screen PHQ 2/9 Scores 03/06/2016  PHQ - 2 Score 2  PHQ- 9 Score 10   Fall Risk  03/06/2016  Falls in the past year? No    Functional Status Survey: Is the patient deaf or have difficulty hearing?: No Does the patient have difficulty seeing, even when wearing glasses/contacts?: Yes (sometimes) Does the patient have difficulty concentrating, remembering, or making decisions?: Yes (sometimes) Does the patient have difficulty walking or climbing stairs?: Yes (sometimes) Does the patient have difficulty dressing or bathing?: Yes (sometimes) Does the patient have difficulty doing errands alone such as visiting a doctor's office or shopping?: No  Question Answer Points  How often do you have alcoholic drink? never 0  On days you do drink alcohol, how many drinks do you typically consume? 0 0  How oftey will you drink 6 or more in a total? never 0  Total Score:  0   A score of 3 or more in women, and 4 or more in men indicates increased risk for alcohol abuse, EXCEPT if all of the points are from question 1.    Assessment & Plan:     Routine Health Maintenance and Physical Exam  Exercise Activities and Dietary recommendations Goals    None      Immunization History  Administered Date(s) Administered  . Influenza,inj,Quad PF,36+ Mos 06/05/2015  . Influenza-Unspecified 06/01/2014  . Pneumococcal Conjugate-13 06/05/2015  . Pneumococcal Polysaccharide-23 07/17/2012  . Td 07/12/2004  . Tdap 09/28/2010    Health Maintenance  Topic Date Due  . HIV Screening  09/21/1982  . INFLUENZA VACCINE  03/26/2016  . PAP SMEAR  03/05/2018  . TETANUS/TDAP  09/28/2020      Discussed health benefits of physical activity, and encouraged her to engage in regular exercise appropriate for her age and condition.   1. Annual physical exam Normal physical exam today. She is to call the office in the meantime if she has any acute issue, questions or concerns.  2. Breast cancer screening Breast exam was normal today. No family history. She does perform self breast exams. Declines mammogram.  3. Encounter for  surveillance of injectable contraceptive Urine pregnancy negative. Depo-provera given today without complication. She is to return in 3 months for next injection. - POCT urine pregnancy - medroxyPROGESTERone (DEPO-PROVERA) injection 150 mg; Inject 1 mL (150 mg total) into the muscle once.  4. Rheumatoid arthritis involving multiple sites, unspecified rheumatoid factor presence (HCC) Stable. Diagnosis pulled for medication refill. Continue current medical treatment plan. - Oxycodone HCl 10 MG TABS; Take 1 tablet (10 mg total) by mouth every 6 (six) hours as needed. For severe pain  Dispense: 180 tablet; Refill: 0  5. Chronic pain See above medical treatment plan. - Oxycodone HCl 10 MG TABS; Take 1 tablet (10 mg total) by mouth every 6 (six) hours as needed. For severe pain  Dispense: 180 tablet; Refill: 0  6. Encounter for chronic pain management See above medical treatment plan. - Oxycodone HCl 10 MG TABS; Take 1 tablet (10 mg total) by mouth every 6 (six) hours as needed. For severe pain  Dispense: 180 tablet; Refill: 0  --------------------------------------------------------------------    [name], PA-C  [facility]

## 2016-04-16 ENCOUNTER — Other Ambulatory Visit: Payer: Self-pay | Admitting: Physician Assistant

## 2016-04-16 ENCOUNTER — Telehealth: Payer: Self-pay

## 2016-04-16 DIAGNOSIS — M069 Rheumatoid arthritis, unspecified: Secondary | ICD-10-CM

## 2016-04-16 DIAGNOSIS — G8929 Other chronic pain: Secondary | ICD-10-CM

## 2016-04-16 MED ORDER — OXYCODONE HCL 10 MG PO TABS: 10.0000 mg | ORAL_TABLET | Freq: Four times a day (QID) | ORAL | 0 refills | Status: DC | PRN

## 2016-04-16 NOTE — Telephone Encounter (Signed)
Patient advised that prescription for the Oxycodone is ready up front for pick up.  Thanks,  -Joseline

## 2016-04-16 NOTE — Telephone Encounter (Signed)
Pt contacted office for refill request on the following medications:  Oxycodone HCl 10 MG TABS.  [id number]

## 2016-05-07 ENCOUNTER — Other Ambulatory Visit: Payer: Self-pay | Admitting: Rheumatology

## 2016-05-07 DIAGNOSIS — R945 Abnormal results of liver function studies: Principal | ICD-10-CM

## 2016-05-07 DIAGNOSIS — R7989 Other specified abnormal findings of blood chemistry: Secondary | ICD-10-CM

## 2016-05-15 ENCOUNTER — Telehealth: Payer: Self-pay | Admitting: Physician Assistant

## 2016-05-15 NOTE — Telephone Encounter (Signed)
Patient called to report that she has been taking Oxycodone 10 mg and they are not providing her relief. Patient states she has been doubling up on the dose and wanted to let [name] know. Patient states she doesn't want to run out of medication but the 10 mg is not relieving her pain.

## 2016-05-15 NOTE — Telephone Encounter (Signed)
error 

## 2016-05-17 ENCOUNTER — Other Ambulatory Visit: Payer: Medicare Other

## 2016-05-24 IMAGING — CR DG ANKLE COMPLETE 3+V*R*
3 series · 3 of 3 positions shown · non-contrast
Comparison: None.

CLINICAL DATA: Pain starting a few days ago to the lateral and
bottom part of the foot. History rheumatoid arthritis. Initial
encounter.

EXAM:
RIGHT ANKLE - COMPLETE 3+ VIEW

[ankle ap]
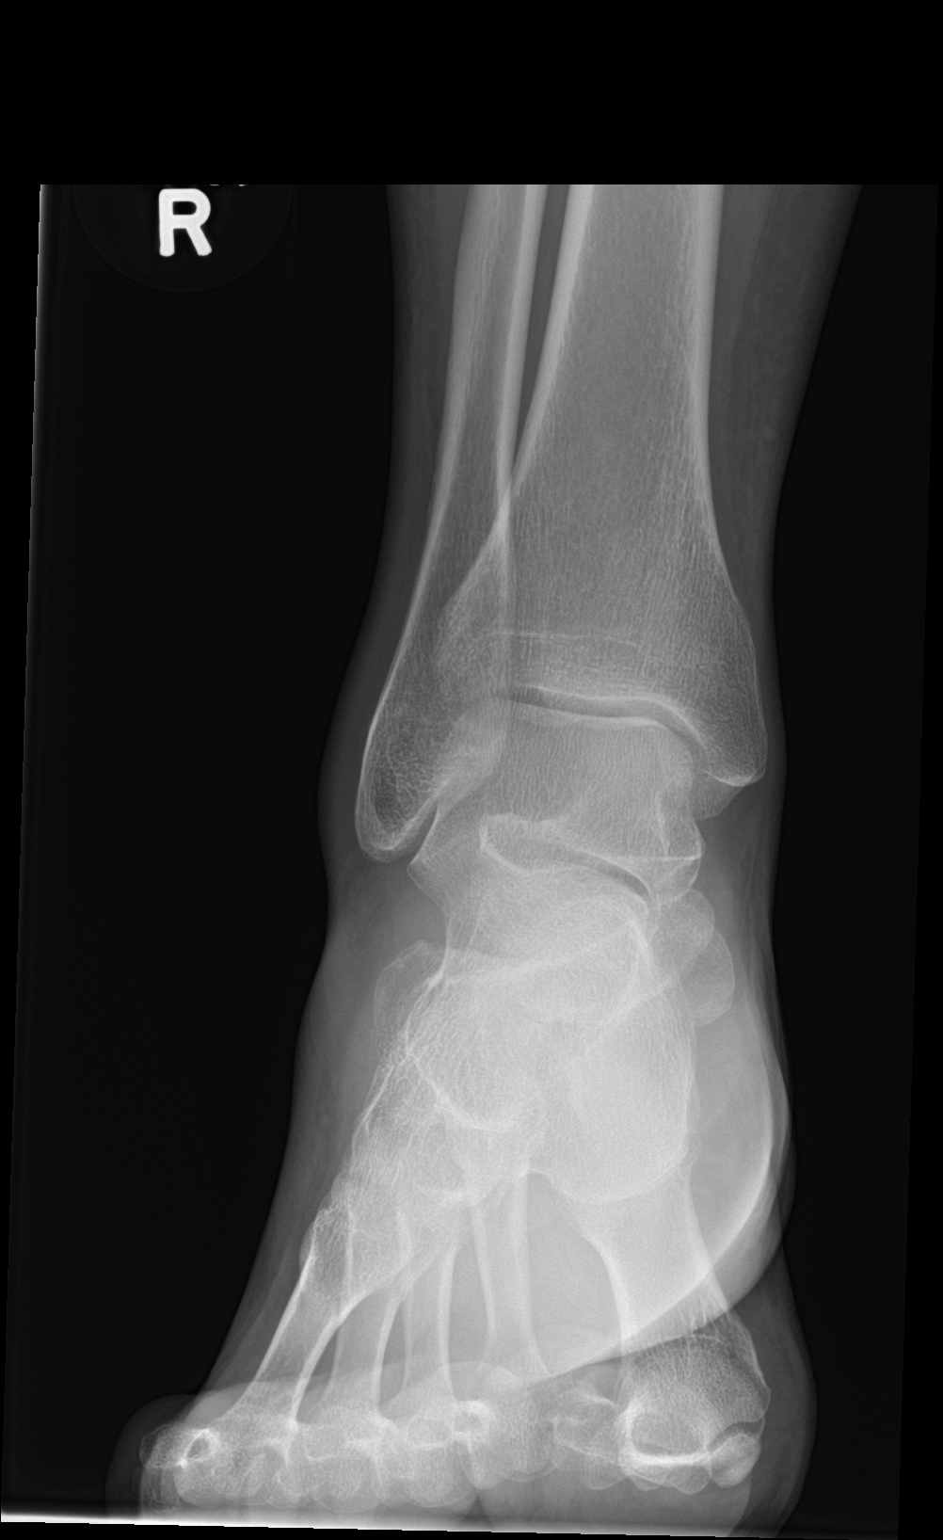

[ankle obl]
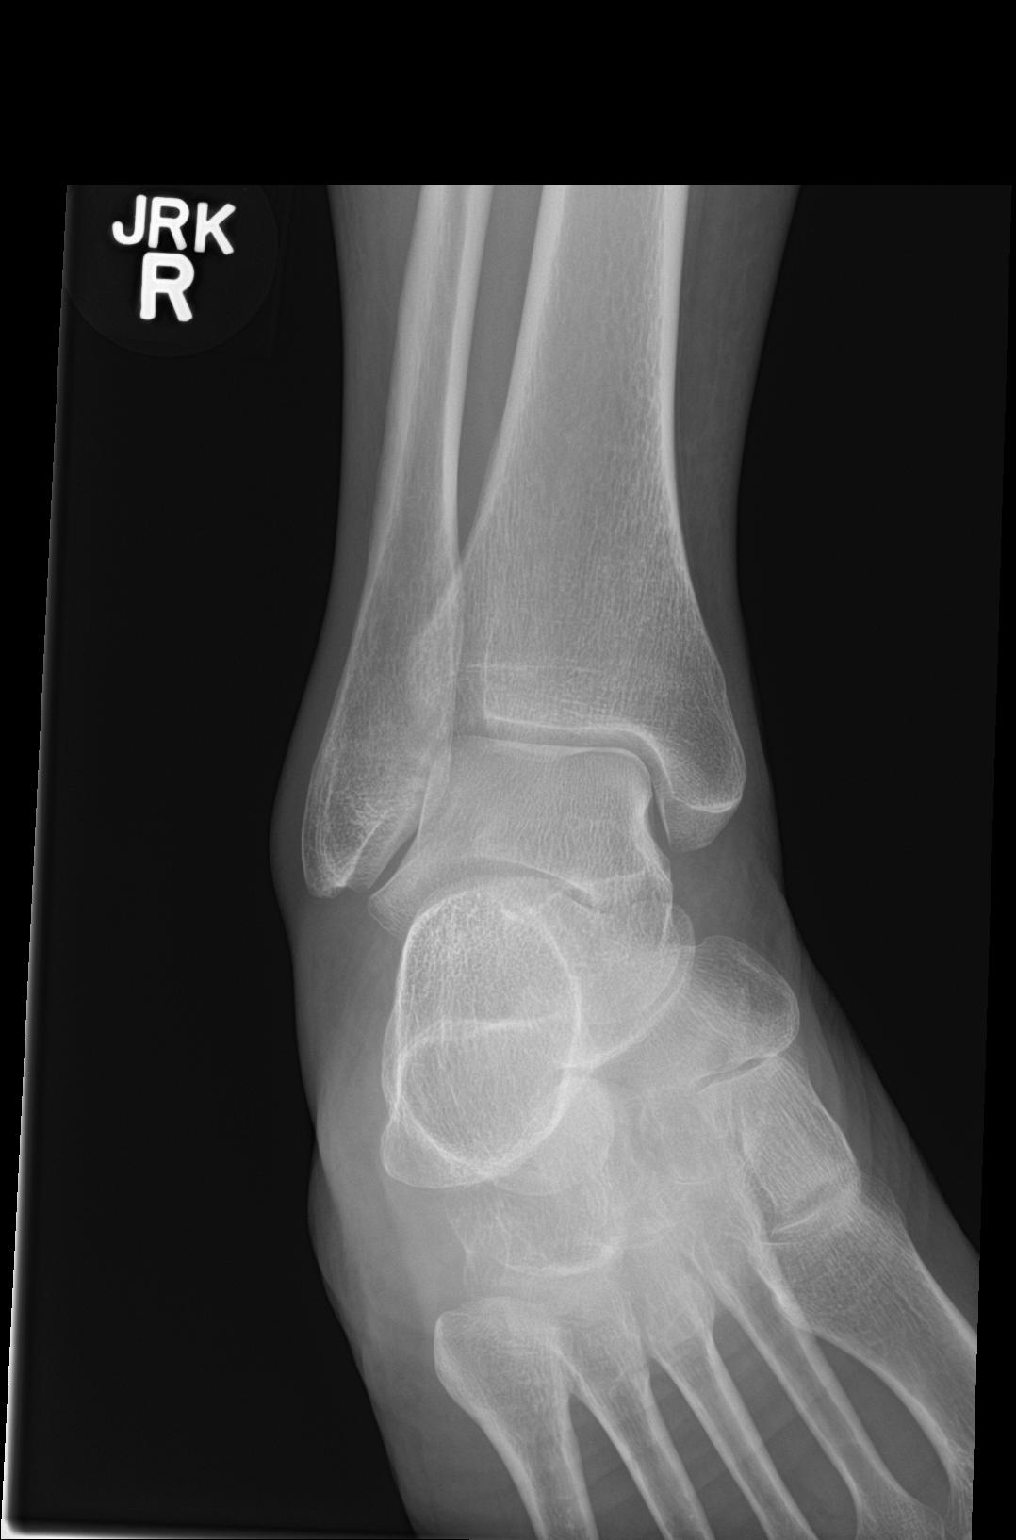

[ankle lat]
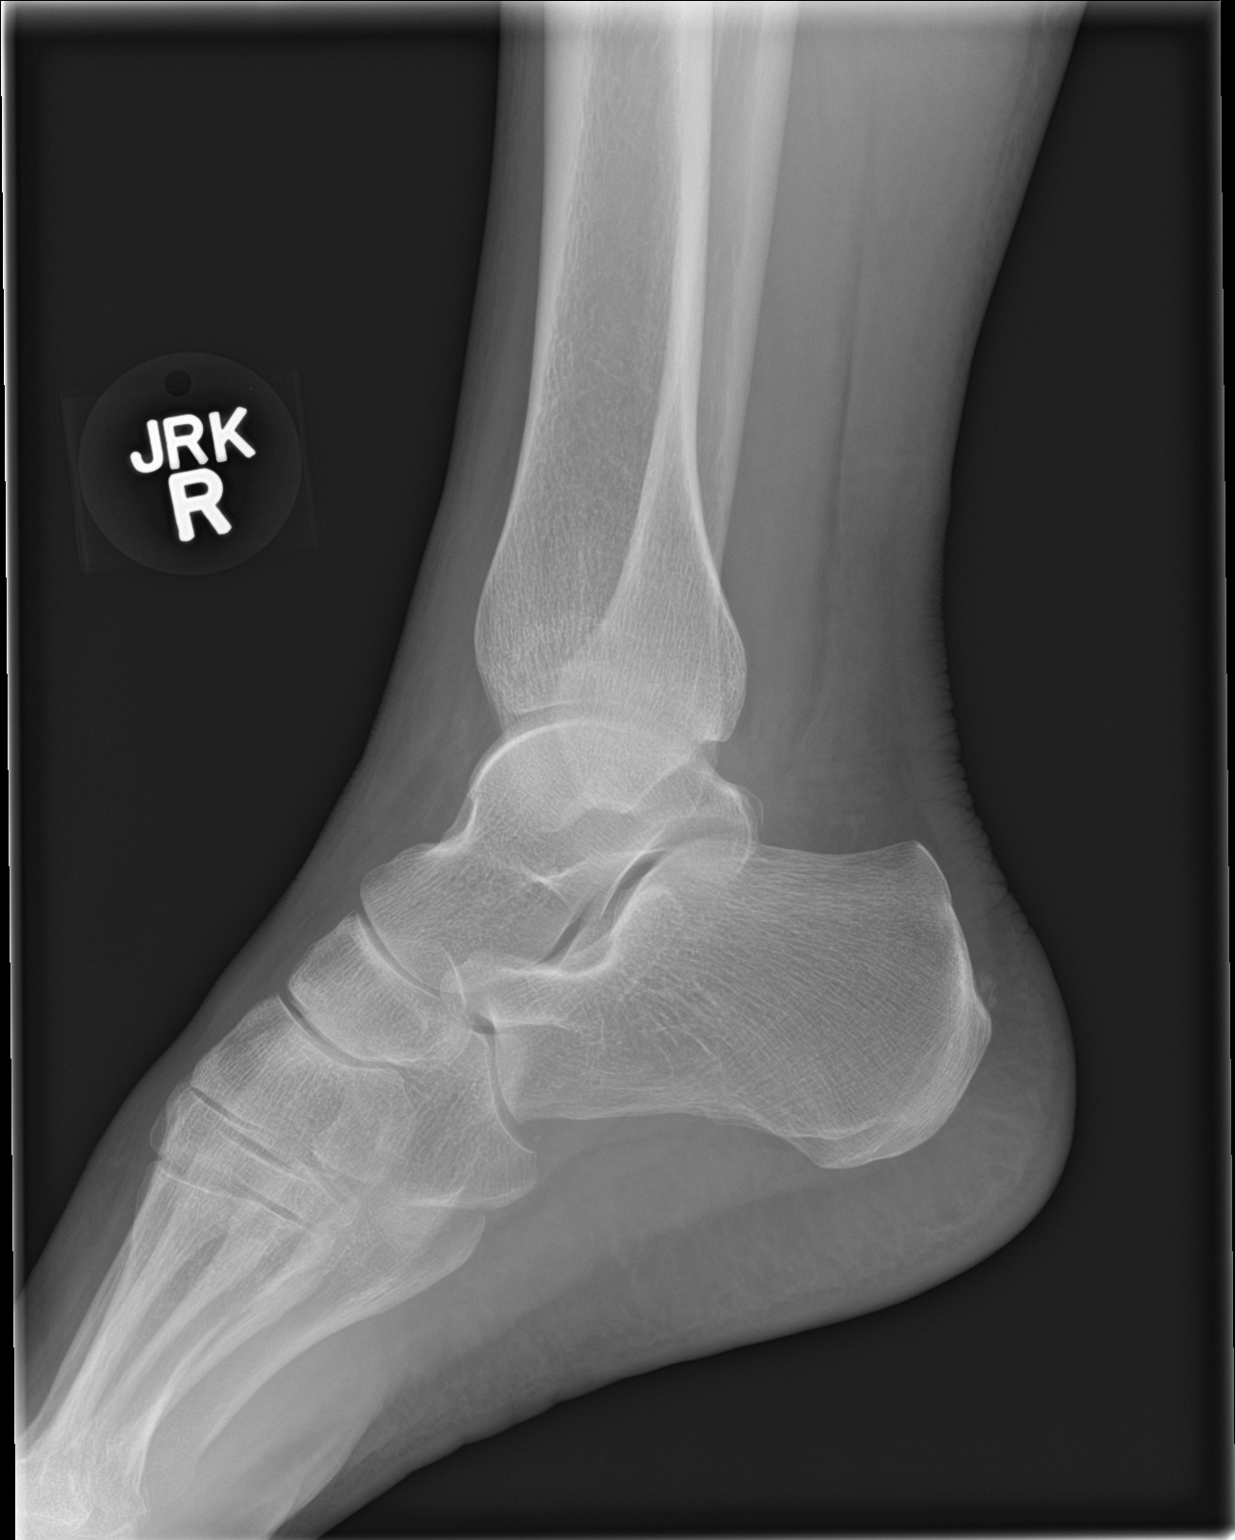

[3 of 3 positions shown; findings below may reference images not displayed]

FINDINGS: Mild soft tissue swelling laterally. There is no evidence of
fracture, dislocation, or joint effusion. No erosive changes or
joint narrowing.
IMPRESSION: Soft tissue swelling without osseous abnormality.

## 2016-05-24 IMAGING — CR DG SHOULDER 2+V*L*
3 series · 3 of 3 positions shown · non-contrast
Comparison: None.

CLINICAL DATA: Pain in the left shoulder with movement. History of
rheumatoid arthritis

EXAM:
LEFT SHOULDER - 2+ VIEW

[shoulder grashey]
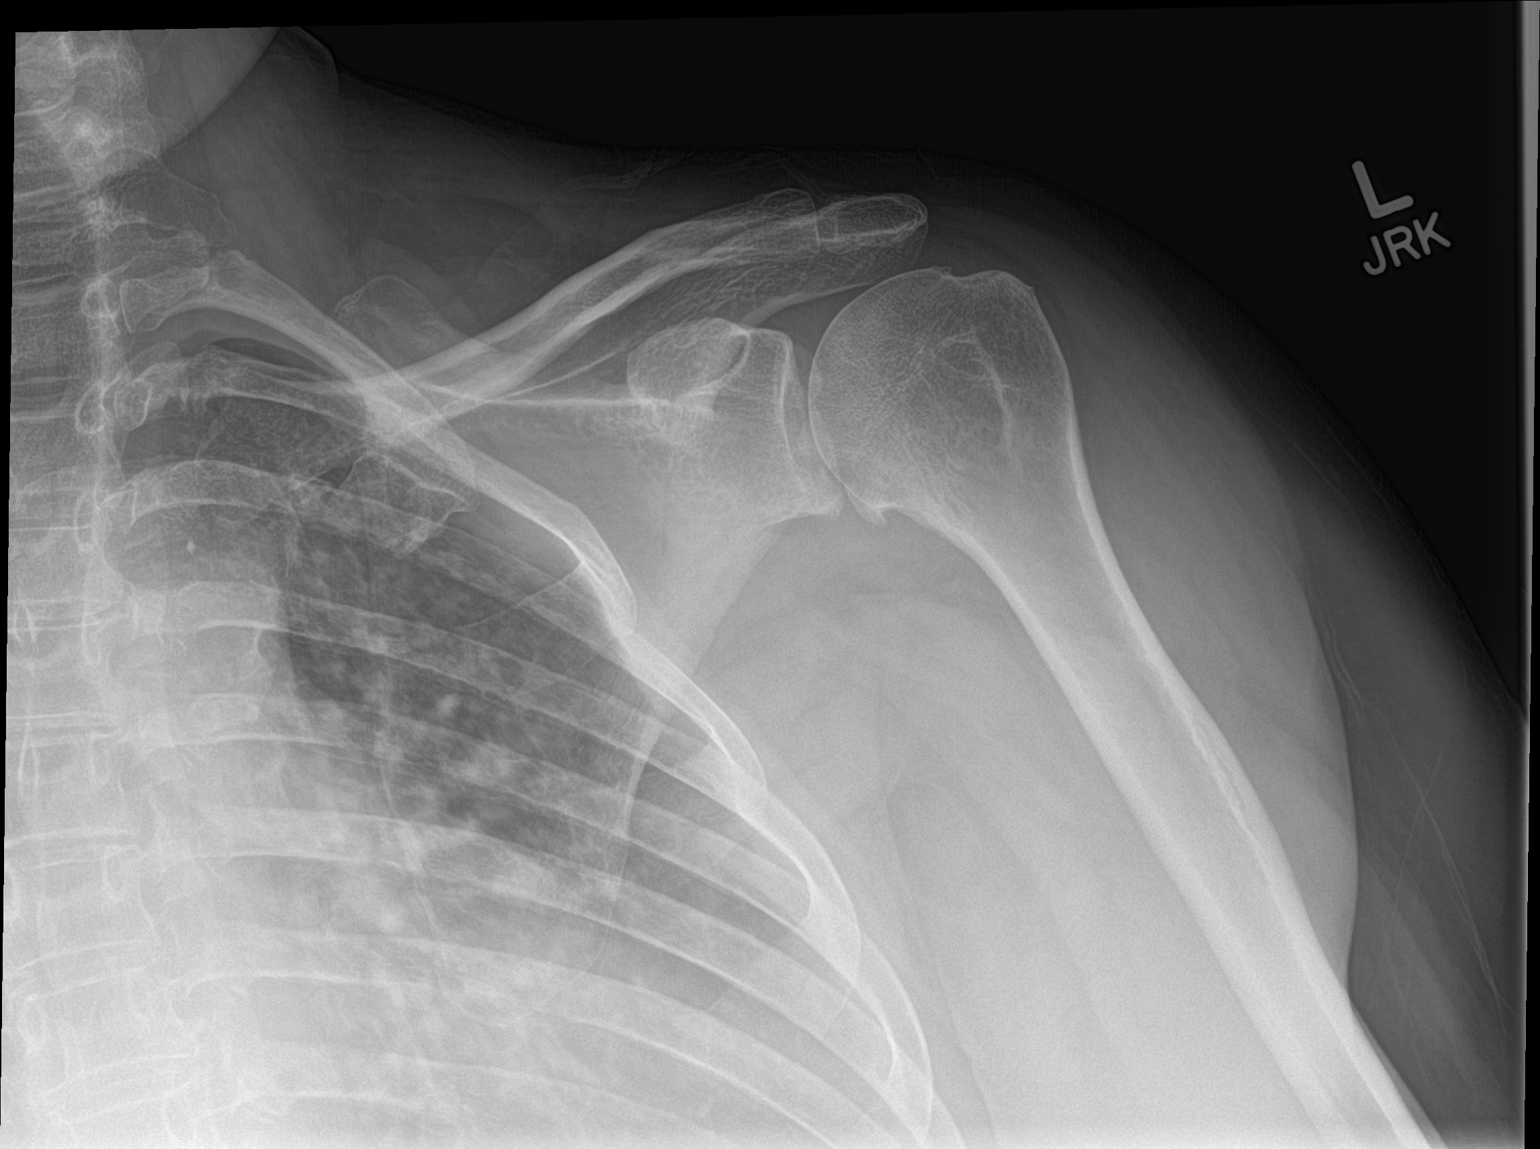

[shoulder y view]
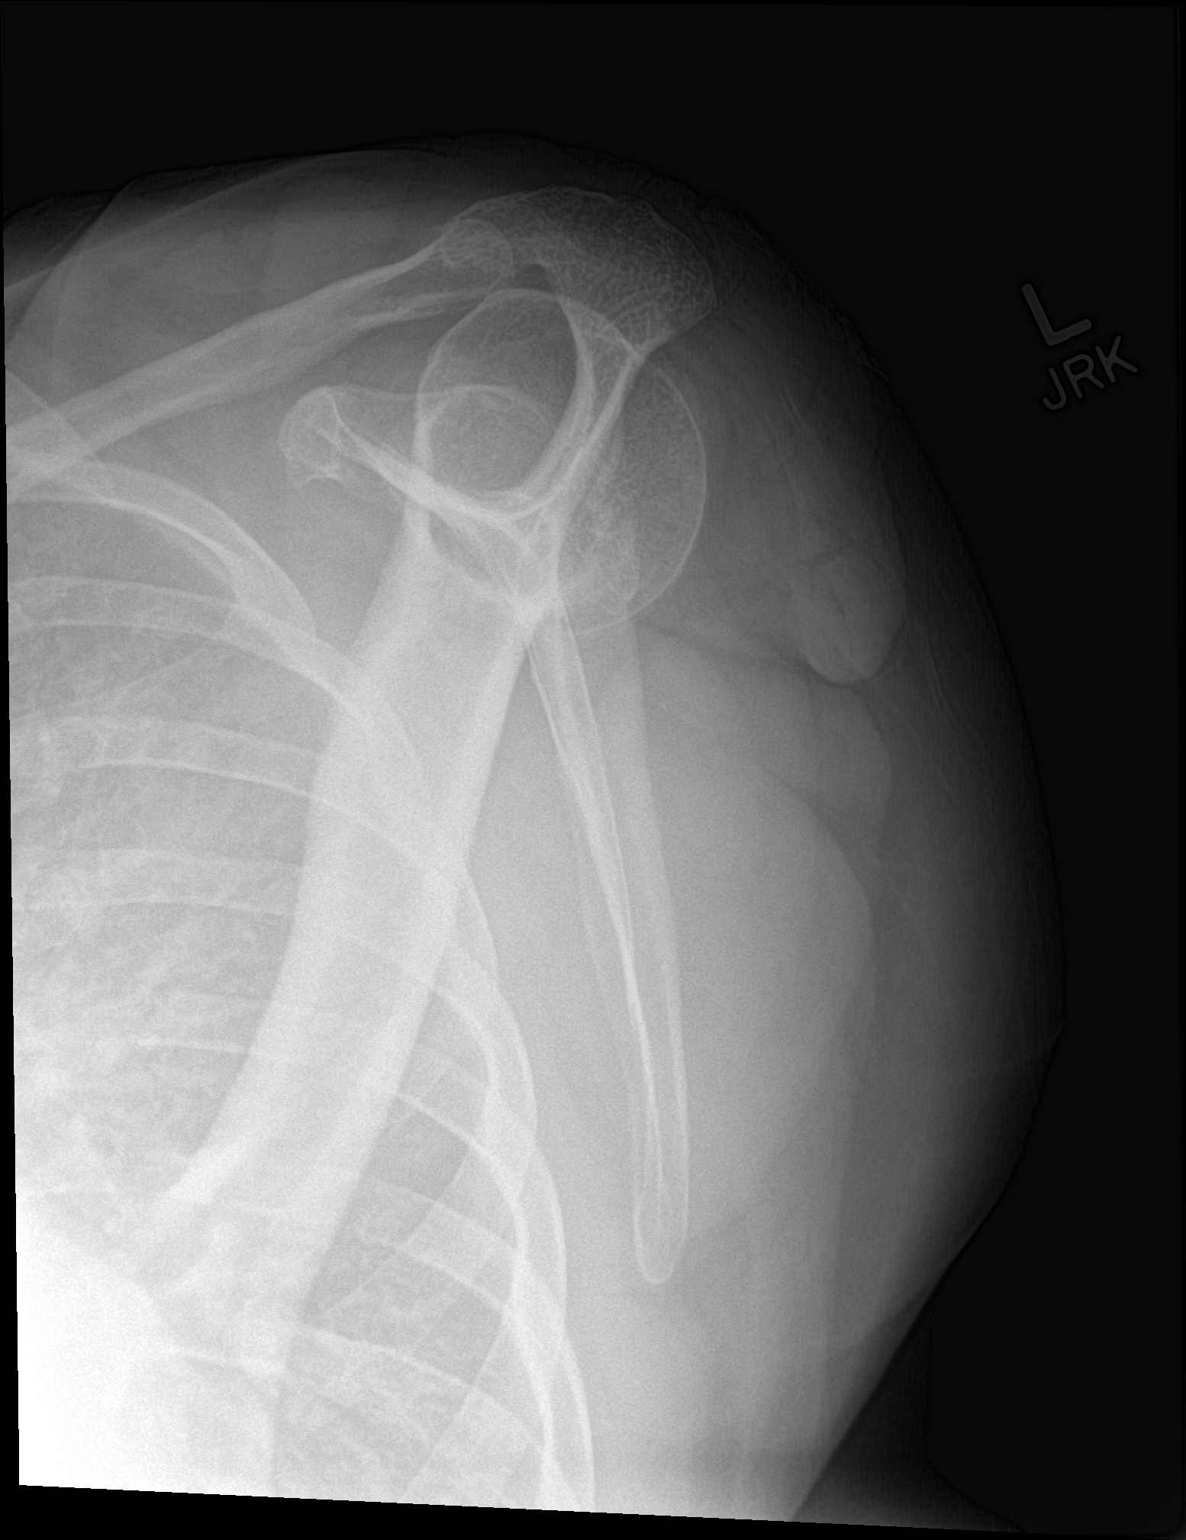

[shoulder axillary]
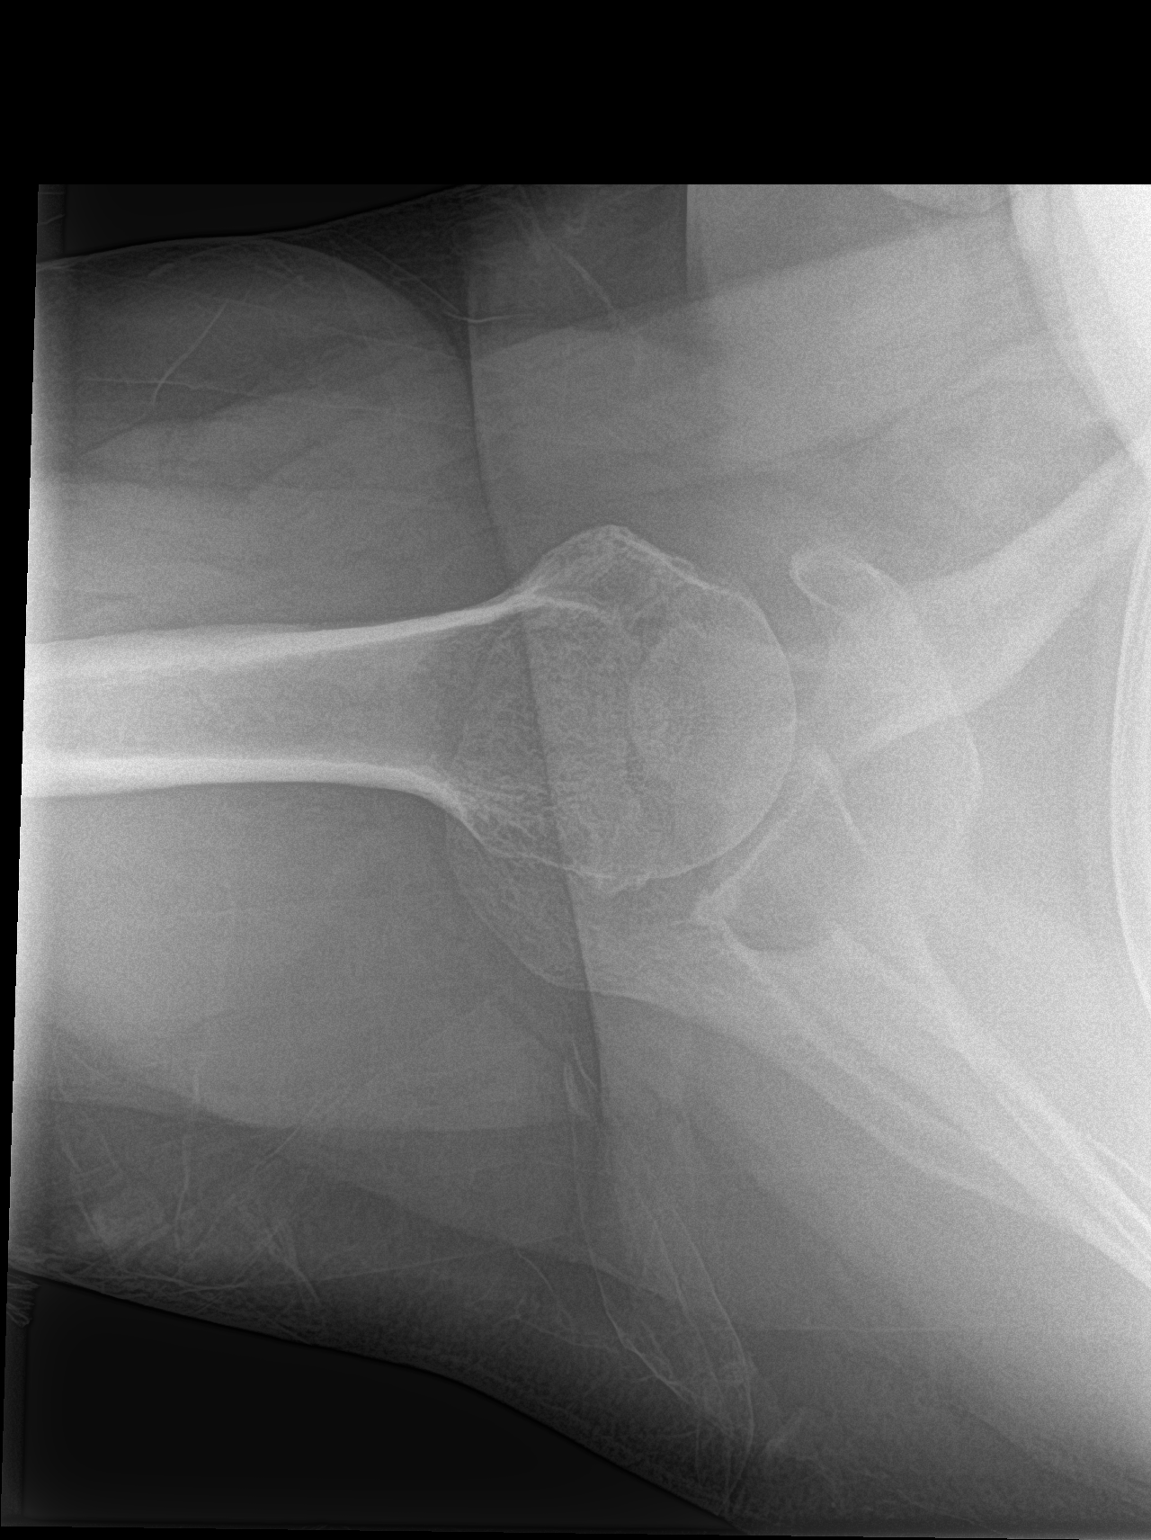

[3 of 3 positions shown; findings below may reference images not displayed]

FINDINGS: No evidence of acute fracture, dislocation, erosion, or soft tissue
calcification. Glenohumeral osteoarthritis with moderate joint
narrowing and inferior spurring.
IMPRESSION: 1. No acute findings.
2. Moderate glenohumeral osteoarthritis.

## 2016-05-27 ENCOUNTER — Other Ambulatory Visit: Payer: Self-pay | Admitting: Physician Assistant

## 2016-05-27 DIAGNOSIS — G8929 Other chronic pain: Secondary | ICD-10-CM

## 2016-05-27 DIAGNOSIS — M069 Rheumatoid arthritis, unspecified: Secondary | ICD-10-CM

## 2016-05-27 MED ORDER — OXYCODONE HCL 10 MG PO TABS: 10.0000 mg | ORAL_TABLET | Freq: Four times a day (QID) | ORAL | 0 refills | Status: DC | PRN

## 2016-05-27 NOTE — Telephone Encounter (Signed)
Pt contacted office for refill request on the following medications:  Oxycodone HCl 10 MG TABS.  [id number]

## 2016-05-27 NOTE — Telephone Encounter (Signed)
Patient advised as directed below.  Thanks,  -Jazmina Muhlenkamp 

## 2016-05-27 NOTE — Telephone Encounter (Signed)
Please notify patient Rx printed and will be available up front

## 2016-05-30 ENCOUNTER — Ambulatory Visit
Admission: RE | Admit: 2016-05-30 | Discharge: 2016-05-30 | Disposition: A | Discharge status: Home / Self Care | Payer: Medicare Other | Source: Ambulatory Visit | Attending: Rheumatology | Admitting: Rheumatology

## 2016-05-30 DIAGNOSIS — K8020 Calculus of gallbladder without cholecystitis without obstruction: Secondary | ICD-10-CM | POA: Diagnosis not present

## 2016-05-30 DIAGNOSIS — R945 Abnormal results of liver function studies: Principal | ICD-10-CM

## 2016-05-30 DIAGNOSIS — R7989 Other specified abnormal findings of blood chemistry: Secondary | ICD-10-CM

## 2016-06-06 ENCOUNTER — Ambulatory Visit (INDEPENDENT_AMBULATORY_CARE_PROVIDER_SITE_OTHER): Payer: Medicare Other | Admitting: Physician Assistant

## 2016-06-06 DIAGNOSIS — Z3042 Encounter for surveillance of injectable contraceptive: Secondary | ICD-10-CM

## 2016-06-06 LAB — POCT URINE PREGNANCY: Preg Test, Ur: NEGATIVE

## 2016-06-06 MED ORDER — MEDROXYPROGESTERONE ACETATE 150 MG/ML IM SUSP
150.0000 mg | Freq: Once | INTRAMUSCULAR | Status: AC
  Administered 2016-06-06: 150 mg via INTRAMUSCULAR

## 2016-06-06 NOTE — Progress Notes (Signed)
Patient comes into office today to receive depo provera injection. Patient was last administered injection on 03/06/16 she was instructed to return back to clinic on or between the dates of 05/22/16-06/05/16. Since patient is late for injection today she will receive a HCG urine test before injection will be given.

## 2016-07-08 ENCOUNTER — Telehealth: Payer: Self-pay | Admitting: Physician Assistant

## 2016-07-08 DIAGNOSIS — G8929 Other chronic pain: Secondary | ICD-10-CM

## 2016-07-08 DIAGNOSIS — M069 Rheumatoid arthritis, unspecified: Secondary | ICD-10-CM

## 2016-07-08 NOTE — Telephone Encounter (Signed)
Pt contacted office for refill request on the following medications: Oxycodone HCl 10 MG TABS  Last Written: 05/27/16 Last OV: 06/06/16 Pt stated she doesn't need it until later this week so she can wait for [name] to return on Wednesday 07/10/16. Pt would like to pick it By Friday 07/12/16 if possible. Please advise. Thanks TNP

## 2016-07-08 NOTE — Telephone Encounter (Signed)
Refill request see message below. KW

## 2016-07-10 MED ORDER — OXYCODONE HCL 10 MG PO TABS: 10.0000 mg | ORAL_TABLET | Freq: Four times a day (QID) | ORAL | 0 refills | Status: DC | PRN

## 2016-07-10 NOTE — Telephone Encounter (Signed)
Left message for patient to let her know that prescription is available for pick up. KW

## 2016-07-10 NOTE — Telephone Encounter (Signed)
Please notify patient Rx printed

## 2016-07-11 ENCOUNTER — Other Ambulatory Visit: Payer: Self-pay | Admitting: Physician Assistant

## 2016-07-11 NOTE — Telephone Encounter (Signed)
Ok to fill early since she is going out of town when it will be due. Please notify pharmacy

## 2016-07-11 NOTE — Telephone Encounter (Signed)
Patient notified she states she will pick Rx up today, called pharmacist and also notified them. KW

## 2016-07-11 NOTE — Telephone Encounter (Signed)
Pt is going to pick up her pain rx.  She wants to know if you Danise Mina) will approve for her to get it filled early before she goes out of town.  She is afraid she will loose the written RX.  Her call back is [phone]  Thanks Con Memos

## 2016-08-23 ENCOUNTER — Other Ambulatory Visit: Payer: Self-pay

## 2016-08-23 DIAGNOSIS — M069 Rheumatoid arthritis, unspecified: Secondary | ICD-10-CM

## 2016-08-23 DIAGNOSIS — G8929 Other chronic pain: Secondary | ICD-10-CM

## 2016-08-23 MED ORDER — OXYCODONE HCL 10 MG PO TABS: 10.0000 mg | ORAL_TABLET | Freq: Four times a day (QID) | ORAL | 0 refills | Status: DC | PRN

## 2016-08-23 NOTE — Telephone Encounter (Signed)
Patient advised that Rx for Oxycodone is up front ready for pick up.  Thanks,  -Shatonia Hoots

## 2016-08-23 NOTE — Telephone Encounter (Signed)
Patient called requesting RX for Oxycodone to pick up today or Tuesday since we will be closed Monday. Looks like last refill was 07/10/16, LOV 03/06/16-aa

## 2016-08-28 ENCOUNTER — Ambulatory Visit: Payer: Medicare Other | Admitting: Physician Assistant

## 2016-08-29 ENCOUNTER — Ambulatory Visit: Payer: Medicare Other | Admitting: Physician Assistant

## 2016-08-30 ENCOUNTER — Ambulatory Visit (INDEPENDENT_AMBULATORY_CARE_PROVIDER_SITE_OTHER): Payer: Medicare Other | Admitting: Physician Assistant

## 2016-08-30 DIAGNOSIS — Z3042 Encounter for surveillance of injectable contraceptive: Secondary | ICD-10-CM | POA: Diagnosis not present

## 2016-08-30 MED ORDER — MEDROXYPROGESTERONE ACETATE 150 MG/ML IM SUSP
150.0000 mg | Freq: Once | INTRAMUSCULAR | Status: AC
  Administered 2016-08-30: 150 mg via INTRAMUSCULAR

## 2016-08-30 NOTE — Progress Notes (Signed)
Nurse Visit for Depo-Provera. Patient is to return March 23 - April 06.

## 2016-10-07 ENCOUNTER — Telehealth: Payer: Self-pay | Admitting: Physician Assistant

## 2016-10-07 DIAGNOSIS — M069 Rheumatoid arthritis, unspecified: Secondary | ICD-10-CM

## 2016-10-07 DIAGNOSIS — G8929 Other chronic pain: Secondary | ICD-10-CM

## 2016-10-07 MED ORDER — OXYCODONE HCL 10 MG PO TABS: 10.0000 mg | ORAL_TABLET | Freq: Four times a day (QID) | ORAL | 0 refills | Status: DC | PRN

## 2016-10-07 NOTE — Telephone Encounter (Signed)
Oxycodone refilled. Was last refilled on 08/23/16. [place] substance abuse registry checked. Patient has not been pharmacy shopping, is only having filled by me, and has not been requesting Rx early.  Per new guidelines patient may require an appt with me in the near future for urine screens.

## 2016-10-07 NOTE — Telephone Encounter (Signed)
Pt would like a  Refill on her   Oxycodone HCl 10 MG TABS  Thanks teri

## 2016-10-07 NOTE — Telephone Encounter (Signed)
Patient advised as below.  

## 2016-10-07 NOTE — Telephone Encounter (Signed)
Medication was last filled on 07/08/16, please review and advise. KW

## 2016-10-22 ENCOUNTER — Ambulatory Visit (INDEPENDENT_AMBULATORY_CARE_PROVIDER_SITE_OTHER): Payer: Medicare Other | Admitting: Physician Assistant

## 2016-10-22 ENCOUNTER — Encounter: Payer: Self-pay | Admitting: Physician Assistant

## 2016-10-22 VITALS — BP 154/92 | HR 96 | Temp 98.7°F | Resp 20 | Wt 194.0 lb

## 2016-10-22 DIAGNOSIS — R05 Cough: Secondary | ICD-10-CM | POA: Diagnosis not present

## 2016-10-22 DIAGNOSIS — R059 Cough, unspecified: Secondary | ICD-10-CM

## 2016-10-22 DIAGNOSIS — J441 Chronic obstructive pulmonary disease with (acute) exacerbation: Secondary | ICD-10-CM

## 2016-10-22 MED ORDER — PROMETHAZINE-DM 6.25-15 MG/5ML PO SYRP: 5.0000 mL | ORAL_SOLUTION | Freq: Every evening | ORAL | 0 refills | Status: DC | PRN

## 2016-10-22 MED ORDER — PREDNISONE 20 MG PO TABS: 20.0000 mg | ORAL_TABLET | Freq: Two times a day (BID) | ORAL | 0 refills | Status: AC

## 2016-10-22 NOTE — Progress Notes (Signed)
Patient: Susan Gilmore Female    DOB: [birth year]   51 y.o.   MRN: [id number] Visit Date: 10/22/2016  Today's Provider: Trinna Post, PA-C   Chief Complaint  Patient presents with  . COPD   Subjective:    Breathing Problem  She complains of cough, difficulty breathing, shortness of breath and wheezing. There is no hemoptysis or sputum production. This is a recurrent problem. The current episode started yesterday. The problem has been gradually worsening. The cough is non-productive. Associated symptoms include dyspnea on exertion, headaches, sneezing and a sore throat. Pertinent negatives include no appetite change, chest pain, ear pain, fever, postnasal drip, rhinorrhea or trouble swallowing. Her symptoms are not alleviated by steroid inhaler. Her past medical history is significant for COPD.    Allergies  Allergen Reactions  . Effexor [Venlafaxine Hcl] Swelling  . Isosorbide Nitrate Other (See Comments)    Reaction: Bad headache     Current Outpatient Prescriptions:  .  albuterol (PROAIR HFA) 108 (90 Base) MCG/ACT inhaler, Inhale 1-2 puffs into the lungs every 6 (six) hours as needed for wheezing or shortness of breath., Disp: 18 g, Rfl: 11 .  ALPRAZolam (XANAX) 1 MG tablet, Take 1 mg by mouth 4 (four) times daily as needed for anxiety. , Disp: , Rfl:  .  amphetamine-dextroamphetamine (ADDERALL XR) 30 MG 24 hr capsule, Take 60 mg by mouth daily., Disp: , Rfl: 0 .  B-D TB SYRINGE 1CC/27GX1/2" 27G X 1/2" 1 ML MISC, USE TO INJECT METHOTREXATE WEEKLY 30 DAYS, Disp: , Rfl: 3 .  budesonide (PULMICORT) 0.25 MG/2ML nebulizer solution, Take 2 mLs (0.25 mg total) by nebulization every 6 (six) hours as needed (for shortness of breath)., Disp: 60 mL, Rfl: 12 .  DULoxetine (CYMBALTA) 60 MG capsule, Take 60 mg by mouth daily., Disp: , Rfl:  .  Eszopiclone 3 MG TABS, Take 3 mg by mouth at bedtime as needed (sleep). Reported on 12/13/2015, Disp: , Rfl:  .  fexofenadine (ALLEGRA) 180 MG  tablet, Take 180 mg by mouth daily., Disp: , Rfl:  .  fluticasone (FLONASE) 50 MCG/ACT nasal spray, Place 2 sprays into both nostrils daily., Disp: 16 g, Rfl: 11 .  Fluticasone-Salmeterol (ADVAIR) 250-50 MCG/DOSE AEPB, Inhale 1 puff into the lungs 2 (two) times daily., Disp: , Rfl:  .  folic acid (FOLVITE) 1 MG tablet, Take 1 tablet by mouth 2 (two) times daily., Disp: , Rfl:  .  hydrochlorothiazide (HYDRODIURIL) 12.5 MG tablet, Take 1 tablet by mouth daily. Reported on 12/13/2015, Disp: , Rfl:  .  hydroxychloroquine (PLAQUENIL) 200 MG tablet, TAKE 2 TABLETS BY MOUTH WITH FOOD OR MILK ONCE A DAY, Disp: , Rfl: 2 .  ipratropium-albuterol (DUONEB) 0.5-2.5 (3) MG/3ML SOLN, Take 3 mLs by nebulization every 6 (six) hours as needed (shortness of breath)., Disp: , Rfl:  .  loratadine (CLARITIN) 10 MG tablet, Take 1 tablet by mouth daily., Disp: , Rfl:  .  medroxyPROGESTERone (DEPO-PROVERA) 150 MG/ML injection, Inject 1 mL (150 mg total) into the muscle every 3 (three) months., Disp: 1 mL, Rfl: 4 .  Menthol-Methyl Salicylate (MUSCLE RUB) 10-15 % CREA, Apply 1 application topically as needed for muscle pain. Reported on 12/13/2015, Disp: , Rfl:  .  methotrexate 50 MG/2ML injection, Inject 20 mg into the skin once a week. , Disp: , Rfl:  .  montelukast (SINGULAIR) 10 MG tablet, Take 1 tablet by mouth daily., Disp: , Rfl:  .  nystatin (MYCOSTATIN) 100000  UNIT/ML suspension, Take 5 mLs (500,000 Units total) by mouth 4 (four) times daily., Disp: 473 mL, Rfl: 0 .  omeprazole (PRILOSEC) 20 MG capsule, Take 1 capsule (20 mg total) by mouth daily., Disp: 30 capsule, Rfl: 11 .  Oxycodone HCl 10 MG TABS, Take 1 tablet (10 mg total) by mouth every 6 (six) hours as needed. For severe pain, Disp: 180 tablet, Rfl: 0 .  propranolol ER (INDERAL LA) 160 MG SR capsule, Take 1 capsule (160 mg total) by mouth daily. Reported on 12/13/2015, Disp: 90 capsule, Rfl: 3  Review of Systems  Constitutional: Positive for fatigue. Negative  for activity change, appetite change, chills, diaphoresis, fever and unexpected weight change.  HENT: Positive for sneezing and sore throat. Negative for congestion, ear discharge, ear pain, postnasal drip, rhinorrhea, sinus pain, sinus pressure, tinnitus, trouble swallowing and voice change.   Eyes: Negative.   Respiratory: Positive for cough, chest tightness, shortness of breath and wheezing. Negative for apnea, hemoptysis, sputum production, choking and stridor.   Cardiovascular: Positive for dyspnea on exertion. Negative for chest pain, palpitations and leg swelling.  Gastrointestinal: Negative.   Neurological: Positive for headaches. Negative for dizziness and light-headedness.    Social History  Substance Use Topics  . Smoking status: Former Smoker    Packs/day: 1.00    Years: 26.00    Types: Cigarettes    Quit date: 07/26/2013  . Smokeless tobacco: Never Used  . Alcohol use No     Comment: Had a history of alcohol use but currently is not drinking any   Objective:   BP (!) 154/92 (BP Location: Left Arm, Patient Position: Sitting, Cuff Size: Large)   Pulse 96   Temp 98.7 F (37.1 C) (Oral)   Resp 20   Wt 194 lb (88 kg)   SpO2 96%   BMI 36.66 kg/m   Physical Exam  Constitutional: She appears well-developed and well-nourished.  HENT:  Mouth/Throat: Oropharynx is clear and moist. No oropharyngeal exudate.  Neck: Neck supple.  Cardiovascular: Normal rate and regular rhythm.   Pulmonary/Chest: She has wheezes. She has no rales.  Increased work of breathing, some SOB. Some wheezing in bilateral lung fields.  Lymphadenopathy:    She has no cervical adenopathy.  Skin: Skin is warm and dry.        Assessment & Plan:     1. COPD exacerbation ([place])  Patient had taken last duoneb treatment at 8:00 am, so it is too early to repeat this in the office. Cough is not productive of sputum. Patient is mildly SOB and there is some wheezing on exam, suspect her lungs sound better 2/2  recent nebulizer treatment. Will tx outpt with steroids with strict precaution that patient must go to ER if she worsens.  - predniSONE (DELTASONE) 20 MG tablet; Take 1 tablet (20 mg total) by mouth 2 (two) times daily with a meal.  Dispense: 10 tablet; Refill: 0  2. Cough  Counseled on sedation risks.   - promethazine-dextromethorphan (PROMETHAZINE-DM) 6.25-15 MG/5ML syrup; Take 5 mLs by mouth at bedtime as needed for cough.  Dispense: 118 mL; Refill: 0  Return if symptoms worsen or fail to improve.  The entirety of the information documented in the History of Present Illness, Review of Systems and Physical Exam were personally obtained by me. Portions of this information were initially documented by Ashley Royalty, CMA and reviewed by me for thoroughness and accuracy.        Trinna Post, PA-C  [facility]

## 2016-10-22 NOTE — Patient Instructions (Signed)
Chronic Obstructive Pulmonary Disease Chronic obstructive pulmonary disease (COPD) is a common lung condition in which airflow from the lungs is limited. COPD is a general term that can be used to describe many different lung problems that limit airflow, including both chronic bronchitis and emphysema. If you have COPD, your lung function will probably never return to normal, but there are measures you can take to improve lung function and make yourself feel better. What are the causes?  Smoking (common).  Exposure to secondhand smoke.  Genetic problems.  Chronic inflammatory lung diseases or recurrent infections. What are the signs or symptoms?  Shortness of breath, especially with physical activity.  Deep, persistent (chronic) cough with a large amount of thick mucus.  Wheezing.  Rapid breaths (tachypnea).  Gray or bluish discoloration (cyanosis) of the skin, especially in your fingers, toes, or lips.  Fatigue.  Weight loss.  Frequent infections or episodes when breathing symptoms become much worse (exacerbations).  Chest tightness. How is this diagnosed? Your health care provider will take a medical history and perform a physical examination to diagnose COPD. Additional tests for COPD may include:  Lung (pulmonary) function tests.  Chest X-ray.  CT scan.  Blood tests. How is this treated? Treatment for COPD may include:  Inhaler and nebulizer medicines. These help manage the symptoms of COPD and make your breathing more comfortable.  Supplemental oxygen. Supplemental oxygen is only helpful if you have a low oxygen level in your blood.  Exercise and physical activity. These are beneficial for nearly all people with COPD.  Lung surgery or transplant.  Nutrition therapy to gain weight, if you are underweight.  Pulmonary rehabilitation. This may involve working with a team of health care providers and specialists, such as respiratory, occupational, and physical  therapists. Follow these instructions at home:  Take all medicines (inhaled or pills) as directed by your health care provider.  Avoid over-the-counter medicines or cough syrups that dry up your airway (such as antihistamines) and slow down the elimination of secretions unless instructed otherwise by your health care provider.  If you are a smoker, the most important thing that you can do is stop smoking. Continuing to smoke will cause further lung damage and breathing trouble. Ask your health care provider for help with quitting smoking. He or she can direct you to community resources or hospitals that provide support.  Avoid exposure to irritants such as smoke, chemicals, and fumes that aggravate your breathing.  Use oxygen therapy and pulmonary rehabilitation if directed by your health care provider. If you require home oxygen therapy, ask your health care provider whether you should purchase a pulse oximeter to measure your oxygen level at home.  Avoid contact with individuals who have a contagious illness.  Avoid extreme temperature and humidity changes.  Eat healthy foods. Eating smaller, more frequent meals and resting before meals may help you maintain your strength.  Stay active, but balance activity with periods of rest. Exercise and physical activity will help you maintain your ability to do things you want to do.  Preventing infection and hospitalization is very important when you have COPD. Make sure to receive all the vaccines your health care provider recommends, especially the pneumococcal and influenza vaccines. Ask your health care provider whether you need a pneumonia vaccine.  Learn and use relaxation techniques to manage stress.  Learn and use controlled breathing techniques as directed by your health care provider. Controlled breathing techniques include: 1. Pursed lip breathing. Start by breathing in (inhaling)   through your nose for 1 second. Then, purse your lips as  if you were going to whistle and breathe out (exhale) through the pursed lips for 2 seconds. 2. Diaphragmatic breathing. Start by putting one hand on your abdomen just above your waist. Inhale slowly through your nose. The hand on your abdomen should move out. Then purse your lips and exhale slowly. You should be able to feel the hand on your abdomen moving in as you exhale.  Learn and use controlled coughing to clear mucus from your lungs. Controlled coughing is a series of short, progressive coughs. The steps of controlled coughing are: 1. Lean your head slightly forward. 2. Breathe in deeply using diaphragmatic breathing. 3. Try to hold your breath for 3 seconds. 4. Keep your mouth slightly open while coughing twice. 5. Spit any mucus out into a tissue. 6. Rest and repeat the steps once or twice as needed. Contact a health care provider if:  You are coughing up more mucus than usual.  There is a change in the color or thickness of your mucus.  Your breathing is more labored than usual.  Your breathing is faster than usual. Get help right away if:  You have shortness of breath while you are resting.  You have shortness of breath that prevents you from:  Being able to talk.  Performing your usual physical activities.  You have chest pain lasting longer than 5 minutes.  Your skin color is more cyanotic than usual.  You measure low oxygen saturations for longer than 5 minutes with a pulse oximeter. This information is not intended to replace advice given to you by your health care provider. Make sure you discuss any questions you have with your health care provider. Document Released: 05/22/2005 Document Revised: 01/18/2016 Document Reviewed: 04/08/2013 Elsevier Interactive Patient Education  2017 [employer] Inc[employer]  

## 2016-11-19 ENCOUNTER — Other Ambulatory Visit: Payer: Self-pay | Admitting: Physician Assistant

## 2016-11-19 DIAGNOSIS — G8929 Other chronic pain: Secondary | ICD-10-CM

## 2016-11-19 DIAGNOSIS — M069 Rheumatoid arthritis, unspecified: Secondary | ICD-10-CM

## 2016-11-19 NOTE — Telephone Encounter (Signed)
Refill   Oxycodone HCl 10 MG TABS  Thank sTeri

## 2016-11-20 ENCOUNTER — Telehealth: Payer: Self-pay | Admitting: Physician Assistant

## 2016-11-20 MED ORDER — OXYCODONE HCL 10 MG PO TABS: 10.0000 mg | ORAL_TABLET | Freq: Four times a day (QID) | ORAL | 0 refills | Status: DC | PRN

## 2016-11-20 NOTE — Telephone Encounter (Signed)
Please review. Thanks!  

## 2016-11-20 NOTE — Telephone Encounter (Signed)
Called Pt to schedule AWV with [facility] - knb °

## 2016-11-29 ENCOUNTER — Ambulatory Visit (INDEPENDENT_AMBULATORY_CARE_PROVIDER_SITE_OTHER): Payer: Medicare Other | Admitting: Physician Assistant

## 2016-11-29 ENCOUNTER — Ambulatory Visit: Payer: Medicare Other | Admitting: Physician Assistant

## 2016-11-29 DIAGNOSIS — Z3042 Encounter for surveillance of injectable contraceptive: Secondary | ICD-10-CM | POA: Diagnosis not present

## 2016-11-29 MED ORDER — MEDROXYPROGESTERONE ACETATE 150 MG/ML IM SUSP
150.0000 mg | Freq: Once | INTRAMUSCULAR | Status: AC
  Administered 2016-11-29: 150 mg via INTRAMUSCULAR

## 2016-11-29 NOTE — Progress Notes (Signed)
Patient supplied her own depo provera injection. This was given and well tolerated. She is to return between 02/14/17-02/28/17 for her next injection.

## 2016-11-29 NOTE — Patient Instructions (Signed)
appt 02/24/2017

## 2016-12-26 ENCOUNTER — Other Ambulatory Visit: Payer: Self-pay | Admitting: Physician Assistant

## 2016-12-26 DIAGNOSIS — M069 Rheumatoid arthritis, unspecified: Secondary | ICD-10-CM

## 2016-12-26 DIAGNOSIS — G8929 Other chronic pain: Secondary | ICD-10-CM

## 2016-12-26 MED ORDER — OXYCODONE HCL 10 MG PO TABS: 10.0000 mg | ORAL_TABLET | Freq: Four times a day (QID) | ORAL | 0 refills | Status: DC | PRN

## 2016-12-26 NOTE — Telephone Encounter (Signed)
Last fill was on 11/20/16-aa

## 2016-12-26 NOTE — Telephone Encounter (Signed)
Rx printed and [place] substance registry checked. No red flags.

## 2016-12-26 NOTE — Telephone Encounter (Signed)
Pt needs refill on   Oxycodone HCl 10 MG TABS  Thanks Con Memos

## 2016-12-27 NOTE — Telephone Encounter (Signed)
Patient advised.  Thanks,  -Ashaun Gaughan 

## 2017-01-03 ENCOUNTER — Other Ambulatory Visit: Payer: Self-pay | Admitting: Physician Assistant

## 2017-01-03 DIAGNOSIS — M069 Rheumatoid arthritis, unspecified: Secondary | ICD-10-CM

## 2017-01-03 DIAGNOSIS — G8929 Other chronic pain: Secondary | ICD-10-CM

## 2017-01-03 NOTE — Telephone Encounter (Signed)
Pt contacted office for refill request on the following medications:  Oxycodone HCl 10 MG TABS.  NB#[id number]  Pt is going out of town today for 3 weeks and is requesting this a few days early/MW

## 2017-01-03 NOTE — Telephone Encounter (Signed)
This was most recently filled just last week on 12/26/16 for 180 tabs. If I write again I have to post date until 01/25/17.

## 2017-01-03 NOTE — Telephone Encounter (Signed)
Spoke with patient and she states she has not picked up RX from last week but what she was actually asking is her last RX was filled on 12/08/16 per patient and wanted to know if it is ok for her to get it 3 days early since she is leaving town. I spoke with Fenton Malling, PA about this and this request has been approved.

## 2017-01-03 NOTE — Telephone Encounter (Signed)
Pharmacist Rhae Lerner

## 2017-01-23 ENCOUNTER — Ambulatory Visit: Payer: Self-pay

## 2017-02-03 ENCOUNTER — Encounter: Payer: Self-pay | Admitting: Physician Assistant

## 2017-02-13 ENCOUNTER — Telehealth: Payer: Self-pay | Admitting: Physician Assistant

## 2017-02-13 ENCOUNTER — Ambulatory Visit (INDEPENDENT_AMBULATORY_CARE_PROVIDER_SITE_OTHER): Payer: Medicare Other

## 2017-02-13 VITALS — BP 168/94 | HR 60 | Temp 98.2°F | Ht 61.0 in | Wt 191.4 lb

## 2017-02-13 DIAGNOSIS — G8929 Other chronic pain: Secondary | ICD-10-CM

## 2017-02-13 DIAGNOSIS — M069 Rheumatoid arthritis, unspecified: Secondary | ICD-10-CM

## 2017-02-13 DIAGNOSIS — Z0000 Encounter for general adult medical examination without abnormal findings: Secondary | ICD-10-CM | POA: Diagnosis not present

## 2017-02-13 MED ORDER — OXYCODONE HCL 10 MG PO TABS: 10.0000 mg | ORAL_TABLET | Freq: Four times a day (QID) | ORAL | 0 refills | Status: DC | PRN

## 2017-02-13 NOTE — Patient Instructions (Signed)
Susan Gilmore , Thank you for taking time to come for your Medicare Wellness Visit. I appreciate your ongoing commitment to your health goals. Please review the following plan we discussed and let me know if I can assist you in the future.   Screening recommendations/referrals: Colonoscopy: completed 12/29/14, due 12/2024 Mammogram: N/A Bone Density: N/A Recommended yearly ophthalmology/optometry visit for glaucoma screening and checkup Recommended yearly dental visit for hygiene and checkup  Vaccinations: Influenza vaccine: due 04/2017 Pneumococcal vaccine: N/A Tdap vaccine: completed 09/28/10, due 09/2020 Shingles vaccine: N/A  Advanced directives: Advance directive discussed with you today. Even though you declined this today please call our office should you change your mind and we can give you the proper paperwork for you to fill out.  Conditions/risks identified: Recommend increasing water intake to 4-6 glasses a day.   Next appointment: 02/24/17 @ 8:30 AM   Preventive Care 65 Years and Older, Female Preventive care refers to lifestyle choices and visits with your health care provider that can promote health and wellness. What does preventive care include?  A yearly physical exam. This is also called an annual well check.  Dental exams once or twice a year.  Routine eye exams. Ask your health care provider how often you should have your eyes checked.  Personal lifestyle choices, including:  Daily care of your teeth and gums.  Regular physical activity.  Eating a healthy diet.  Avoiding tobacco and drug use.  Limiting alcohol use.  Practicing safe sex.  Taking low-dose aspirin every day.  Taking vitamin and mineral supplements as recommended by your health care provider. What happens during an annual well check? The services and screenings done by your health care provider during your annual well check will depend on your age, overall health, lifestyle risk  factors, and family history of disease. Counseling  Your health care provider may ask you questions about your:  Alcohol use.  Tobacco use.  Drug use.  Emotional well-being.  Home and relationship well-being.  Sexual activity.  Eating habits.  History of falls.  Memory and ability to understand (cognition).  Work and work Statistician.  Reproductive health. Screening  You may have the following tests or measurements:  Height, weight, and BMI.  Blood pressure.  Lipid and cholesterol levels. These may be checked every 5 years, or more frequently if you are over 34 years old.  Skin check.  Lung cancer screening. You may have this screening every year starting at age 49 if you have a 30-pack-year history of smoking and currently smoke or have quit within the past 15 years.  Fecal occult blood test (FOBT) of the stool. You may have this test every year starting at age 51.  Flexible sigmoidoscopy or colonoscopy. You may have a sigmoidoscopy every 5 years or a colonoscopy every 10 years starting at age 48.  Hepatitis C blood test.  Hepatitis B blood test.  Sexually transmitted disease (STD) testing.  Diabetes screening. This is done by checking your blood sugar (glucose) after you have not eaten for a while (fasting). You may have this done every 1-3 years.  Bone density scan. This is done to screen for osteoporosis. You may have this done starting at age 89.  Mammogram. This may be done every 1-2 years. Talk to your health care provider about how often you should have regular mammograms. Talk with your health care provider about your test results, treatment options, and if necessary, the need for more tests. Vaccines  Your health care  provider may recommend certain vaccines, such as:  Influenza vaccine. This is recommended every year.  Tetanus, diphtheria, and acellular pertussis (Tdap, Td) vaccine. You may need a Td booster every 10 years.  Zoster vaccine. You  may need this after age 6.  Pneumococcal 13-valent conjugate (PCV13) vaccine. One dose is recommended after age 63.  Pneumococcal polysaccharide (PPSV23) vaccine. One dose is recommended after age 71. Talk to your health care provider about which screenings and vaccines you need and how often you need them. This information is not intended to replace advice given to you by your health care provider. Make sure you discuss any questions you have with your health care provider. Document Released: 09/08/2015 Document Revised: 05/01/2016 Document Reviewed: 06/13/2015 Elsevier Interactive Patient Education  2017 [employer] Prevention in the Home Falls can cause injuries. They can happen to people of all ages. There are many things you can do to make your home safe and to help prevent falls. What can I do on the outside of my home?  Regularly fix the edges of walkways and driveways and fix any cracks.  Remove anything that might make you trip as you walk through a door, such as a raised step or threshold.  Trim any bushes or trees on the path to your home.  Use bright outdoor lighting.  Clear any walking paths of anything that might make someone trip, such as rocks or tools.  Regularly check to see if handrails are loose or broken. Make sure that both sides of any steps have handrails.  Any raised decks and porches should have guardrails on the edges.  Have any leaves, snow, or ice cleared regularly.  Use sand or salt on walking paths during winter.  Clean up any spills in your garage right away. This includes oil or grease spills. What can I do in the bathroom?  Use night lights.  Install grab bars by the toilet and in the tub and shower. Do not use towel bars as grab bars.  Use non-skid mats or decals in the tub or shower.  If you need to sit down in the shower, use a plastic, non-slip stool.  Keep the floor dry. Clean up any water that spills on the floor as soon as  it happens.  Remove soap buildup in the tub or shower regularly.  Attach bath mats securely with double-sided non-slip rug tape.  Do not have throw rugs and other things on the floor that can make you trip. What can I do in the bedroom?  Use night lights.  Make sure that you have a light by your bed that is easy to reach.  Do not use any sheets or blankets that are too big for your bed. They should not hang down onto the floor.  Have a firm chair that has side arms. You can use this for support while you get dressed.  Do not have throw rugs and other things on the floor that can make you trip. What can I do in the kitchen?  Clean up any spills right away.  Avoid walking on wet floors.  Keep items that you use a lot in easy-to-reach places.  If you need to reach something above you, use a strong step stool that has a grab bar.  Keep electrical cords out of the way.  Do not use floor polish or wax that makes floors slippery. If you must use wax, use non-skid floor wax.  Do not have throw  rugs and other things on the floor that can make you trip. What can I do with my stairs?  Do not leave any items on the stairs.  Make sure that there are handrails on both sides of the stairs and use them. Fix handrails that are broken or loose. Make sure that handrails are as long as the stairways.  Check any carpeting to make sure that it is firmly attached to the stairs. Fix any carpet that is loose or worn.  Avoid having throw rugs at the top or bottom of the stairs. If you do have throw rugs, attach them to the floor with carpet tape.  Make sure that you have a light switch at the top of the stairs and the bottom of the stairs. If you do not have them, ask someone to add them for you. What else can I do to help prevent falls?  Wear shoes that:  Do not have high heels.  Have rubber bottoms.  Are comfortable and fit you well.  Are closed at the toe. Do not wear sandals.  If  you use a stepladder:  Make sure that it is fully opened. Do not climb a closed stepladder.  Make sure that both sides of the stepladder are locked into place.  Ask someone to hold it for you, if possible.  Clearly mark and make sure that you can see:  Any grab bars or handrails.  First and last steps.  Where the edge of each step is.  Use tools that help you move around (mobility aids) if they are needed. These include:  Canes.  Walkers.  Scooters.  Crutches.  Turn on the lights when you go into a dark area. Replace any light bulbs as soon as they burn out.  Set up your furniture so you have a clear path. Avoid moving your furniture around.  If any of your floors are uneven, fix them.  If there are any pets around you, be aware of where they are.  Review your medicines with your doctor. Some medicines can make you feel dizzy. This can increase your chance of falling. Ask your doctor what other things that you can do to help prevent falls. This information is not intended to replace advice given to you by your health care provider. Make sure you discuss any questions you have with your health care provider. Document Released: 06/08/2009 Document Revised: 01/18/2016 Document Reviewed: 09/16/2014 Elsevier Interactive Patient Education  2017 [employer].

## 2017-02-13 NOTE — Telephone Encounter (Signed)
Patient was here in the office today for her AWV with the nurse health advisor and is requesting a refill on her Oxycodone 10mg . This was last filled 12/26/16

## 2017-02-13 NOTE — Progress Notes (Signed)
Subjective:   Susan Gilmore is a 51 y.o. female who presents for Medicare Annual (Subsequent) preventive examination.  Review of Systems:  N/A  Cardiac Risk Factors include: hypertension     Objective:     Vitals: BP (!) 168/94 (BP Location: Left Arm)   Pulse 60   Temp 98.2 F (36.8 C) (Oral)   Ht 5\' 1"  (1.549 m)   Wt 191 lb 6.4 oz (86.8 kg)   BMI 36.16 kg/m   Body mass index is 36.16 kg/m.   Tobacco History  Smoking Status  . Former Smoker  . Packs/day: 1.00  . Years: 26.00  . Types: Cigarettes  . Quit date: 07/26/2013  Smokeless Tobacco  . Never Used     Counseling given: Not Answered   Past Medical History:  Diagnosis Date  . Abnormal Pap smear 04/28/2012   normal pap /positive hpv   . Abnormal stress test   . Allergic rhinitis   . Bipolar disorder ([place])   . Carpal tunnel syndrome   . Chest pain   . Chronic anxiety   . Contraception   . Cough   . HPV (human papilloma virus) anogenital infection   . HTN (hypertension)   . Hyperplastic colon polyp   . Insomnia   . Palpitations   . Pneumonia    Past Surgical History:  Procedure Laterality Date  . full mouth dental    . GANGLION CYST EXCISION    . WISDOM TOOTH EXTRACTION     Family History  Problem Relation Age of Onset  . Stroke Paternal Grandmother   . Heart disease Paternal Grandmother   . Diabetes Maternal Grandmother   . Heart disease Maternal Grandmother   . Hypertension Maternal Grandmother   . Heart disease Maternal Grandfather   . Hypertension Maternal Grandfather   . Heart disease Father   . Stroke Father   . Hypertension Father   . Hyperlipidemia Father   . Diabetes Mother   . Heart attack Mother    History  Sexual Activity  . Sexual activity: Yes  . Birth control/ protection: Injection    Comment: depo    Outpatient Encounter Prescriptions as of 02/13/2017  Medication Sig  . albuterol (PROAIR HFA) 108 (90 Base) MCG/ACT inhaler Inhale 1-2 puffs into the lungs every 6  (six) hours as needed for wheezing or shortness of breath.  . ALPRAZolam (XANAX) 1 MG tablet Take 1 mg by mouth 4 (four) times daily as needed for anxiety.   [name] amphetamine-dextroamphetamine (ADDERALL XR) 30 MG 24 hr capsule Take 60 mg by mouth daily.  [name] doxepin (SINEQUAN) 25 MG capsule Take 50 mg by mouth at bedtime.  . DULoxetine (CYMBALTA) 60 MG capsule Take 120 mg by mouth daily.   . fluticasone (FLONASE) 50 MCG/ACT nasal spray Place 2 sprays into both nostrils daily.  . Fluticasone-Salmeterol (ADVAIR) 250-50 MCG/DOSE AEPB Inhale 1 puff into the lungs 2 (two) times daily.  [name] ipratropium-albuterol (DUONEB) 0.5-2.5 (3) MG/3ML SOLN Take 3 mLs by nebulization every 6 (six) hours as needed (shortness of breath).  . loratadine (CLARITIN) 10 MG tablet Take 1 tablet by mouth daily.  . medroxyPROGESTERone (DEPO-PROVERA) 150 MG/ML injection Inject 1 mL (150 mg total) into the muscle every 3 (three) months.  . methotrexate 50 MG/2ML injection Inject 20 mg into the skin once a week.   . montelukast (SINGULAIR) 10 MG tablet Take 1 tablet by mouth daily.  [name] omeprazole (PRILOSEC) 20 MG capsule Take 1 capsule (20 mg  total) by mouth daily. (Patient taking differently: Take 20 mg by mouth 2 (two) times daily before a meal. )  . Oxycodone HCl 10 MG TABS Take 1 tablet (10 mg total) by mouth every 6 (six) hours as needed. For severe pain  . propranolol ER (INDERAL LA) 160 MG SR capsule Take 1 capsule (160 mg total) by mouth daily. Reported on 12/13/2015  . B-D TB SYRINGE 1CC/27GX1/2" 27G X 1/2" 1 ML MISC USE TO INJECT METHOTREXATE WEEKLY 30 DAYS  . budesonide (PULMICORT) 0.25 MG/2ML nebulizer solution Take 2 mLs (0.25 mg total) by nebulization every 6 (six) hours as needed (for shortness of breath). (Patient not taking: Reported on 02/13/2017)  . Eszopiclone 3 MG TABS Take 3 mg by mouth at bedtime as needed (sleep). Reported on 12/13/2015  . fexofenadine (ALLEGRA) 180 MG tablet Take 180 mg by mouth daily.  . folic  acid (FOLVITE) 1 MG tablet Take 1 tablet by mouth 2 (two) times daily.  . hydrochlorothiazide (HYDRODIURIL) 12.5 MG tablet Take 1 tablet by mouth daily. Reported on 12/13/2015  . hydroxychloroquine (PLAQUENIL) 200 MG tablet TAKE 2 TABLETS BY MOUTH WITH FOOD OR MILK ONCE A DAY  . nystatin (MYCOSTATIN) 100000 UNIT/ML suspension Take 5 mLs (500,000 Units total) by mouth 4 (four) times daily. (Patient not taking: Reported on 02/13/2017)  . [DISCONTINUED] Menthol-Methyl Salicylate (MUSCLE RUB) 10-15 % CREA Apply 1 application topically as needed for muscle pain. Reported on 12/13/2015  . [DISCONTINUED] promethazine-dextromethorphan (PROMETHAZINE-DM) 6.25-15 MG/5ML syrup Take 5 mLs by mouth at bedtime as needed for cough.   No facility-administered encounter medications on file as of 02/13/2017.     Activities of Daily Living In your present state of health, do you have any difficulty performing the following activities: 02/13/2017 03/06/2016  Hearing? N N  Vision? Y Y  Difficulty concentrating or making decisions? Tempie Donning  Walking or climbing stairs? Y Y  Dressing or bathing? Y Y  Doing errands, shopping? N N  Preparing Food and eating ? N -  Using the Toilet? N -  In the past six months, have you accidently leaked urine? N -  Do you have problems with loss of bowel control? N -  Managing your Medications? N -  Managing your Finances? N -  Housekeeping or managing your Housekeeping? N -  Some recent data might be hidden    Patient Care Team: Rubye Beach as PCP - General (Physician Assistant) Chucky May, MD as Consulting Physician (Psychiatry) Gavin Pound, MD as Consulting Physician (Rheumatology) Rosita Kea, PA-C as Consulting Physician (Family Medicine)    Assessment:     Exercise Activities and Dietary recommendations Current Exercise Habits: The patient does not participate in regular exercise at present (babysits grandchild)  Goals    . Increase water intake           Recommend increasing water intake to 4-6 glasses a day.       Fall Risk Fall Risk  02/13/2017 03/06/2016  Falls in the past year? No No   Depression Screen PHQ 2/9 Scores 02/13/2017 03/06/2016  PHQ - 2 Score 4 2  PHQ- 9 Score 8 10     Cognitive Function     6CIT Screen 02/13/2017  What Year? 0 points  What month? 0 points  What time? 0 points  Count back from 20 0 points  Months in reverse 0 points  Repeat phrase 10 points  Total Score 10    Immunization History  Administered  Date(s) Administered  . Influenza,inj,Quad PF,36+ Mos 06/05/2015  . Influenza-Unspecified 06/01/2014  . Pneumococcal Conjugate-13 06/05/2015  . Pneumococcal Polysaccharide-23 07/17/2012  . Td 07/12/2004  . Tdap 09/28/2010   Screening Tests Health Maintenance  Topic Date Due  . HIV Screening  02/23/2017 (Originally 09/21/1980)  . INFLUENZA VACCINE  03/26/2017  . PAP SMEAR  03/05/2018  . TETANUS/TDAP  09/28/2020      Plan:  I have personally reviewed and addressed the Medicare Annual Wellness questionnaire and have noted the following in the patient's chart:  A. Medical and social history B. Use of alcohol, tobacco or illicit drugs  C. Current medications and supplements D. Functional ability and status E.  Nutritional status F.  Physical activity G. Advance directives H. List of other physicians I.  Hospitalizations, surgeries, and ER visits in previous 12 months J.  [place] such as hearing and vision if needed, cognitive and depression L. Referrals and appointments - none  In addition, I have reviewed and discussed with patient certain preventive protocols, quality metrics, and best practice recommendations. A written personalized care plan for preventive services as well as general preventive health recommendations were provided to patient.  See attached scanned questionnaire for additional information.   Signed,  Fabio Neighbors, LPN Nurse Health Advisor   MD  Recommendations: Pt needs HIV screening at next OV on 02/24/17. Pt would like all BW done together.

## 2017-02-22 ENCOUNTER — Emergency Department: Payer: Medicare Other

## 2017-02-22 ENCOUNTER — Inpatient Hospital Stay
Admission: EM | Admit: 2017-02-22 | Discharge: 2017-02-24 | DRG: 190 | Disposition: A | Discharge status: Home / Self Care | Payer: Medicare Other | Attending: Specialist | Admitting: Specialist

## 2017-02-22 DIAGNOSIS — R001 Bradycardia, unspecified: Secondary | ICD-10-CM | POA: Diagnosis not present

## 2017-02-22 DIAGNOSIS — Z79899 Other long term (current) drug therapy: Secondary | ICD-10-CM | POA: Diagnosis not present

## 2017-02-22 DIAGNOSIS — Z86010 Personal history of colonic polyps: Secondary | ICD-10-CM

## 2017-02-22 DIAGNOSIS — J441 Chronic obstructive pulmonary disease with (acute) exacerbation: Principal | ICD-10-CM | POA: Diagnosis present

## 2017-02-22 DIAGNOSIS — Z87891 Personal history of nicotine dependence: Secondary | ICD-10-CM

## 2017-02-22 DIAGNOSIS — F319 Bipolar disorder, unspecified: Secondary | ICD-10-CM | POA: Diagnosis present

## 2017-02-22 DIAGNOSIS — E876 Hypokalemia: Secondary | ICD-10-CM

## 2017-02-22 DIAGNOSIS — K219 Gastro-esophageal reflux disease without esophagitis: Secondary | ICD-10-CM | POA: Diagnosis present

## 2017-02-22 DIAGNOSIS — M069 Rheumatoid arthritis, unspecified: Secondary | ICD-10-CM | POA: Diagnosis not present

## 2017-02-22 DIAGNOSIS — F419 Anxiety disorder, unspecified: Secondary | ICD-10-CM | POA: Diagnosis not present

## 2017-02-22 DIAGNOSIS — Z833 Family history of diabetes mellitus: Secondary | ICD-10-CM

## 2017-02-22 DIAGNOSIS — Z888 Allergy status to other drugs, medicaments and biological substances status: Secondary | ICD-10-CM

## 2017-02-22 DIAGNOSIS — I10 Essential (primary) hypertension: Secondary | ICD-10-CM | POA: Diagnosis present

## 2017-02-22 DIAGNOSIS — Z823 Family history of stroke: Secondary | ICD-10-CM

## 2017-02-22 DIAGNOSIS — Z7951 Long term (current) use of inhaled steroids: Secondary | ICD-10-CM

## 2017-02-22 DIAGNOSIS — G8929 Other chronic pain: Secondary | ICD-10-CM | POA: Diagnosis present

## 2017-02-22 DIAGNOSIS — J9621 Acute and chronic respiratory failure with hypoxia: Secondary | ICD-10-CM | POA: Diagnosis present

## 2017-02-22 DIAGNOSIS — Z8249 Family history of ischemic heart disease and other diseases of the circulatory system: Secondary | ICD-10-CM | POA: Diagnosis not present

## 2017-02-22 DIAGNOSIS — G4700 Insomnia, unspecified: Secondary | ICD-10-CM | POA: Diagnosis not present

## 2017-02-22 DIAGNOSIS — Z8349 Family history of other endocrine, nutritional and metabolic diseases: Secondary | ICD-10-CM

## 2017-02-22 LAB — CBC WITH DIFFERENTIAL/PLATELET
BASOS ABS: 0.1 10*3/uL (ref 0–0.1)
BASOS PCT: 1 %
Eosinophils Absolute: 0.6 10*3/uL (ref 0–0.7)
Eosinophils Relative: 5 %
HEMATOCRIT: 40.3 % (ref 35.0–47.0)
Hemoglobin: 14.7 g/dL (ref 12.0–16.0)
Lymphocytes Relative: 16 %
Lymphs Abs: 1.7 10*3/uL (ref 1.0–3.6)
MCH: 31.6 pg (ref 26.0–34.0)
MCHC: 36.6 g/dL — ABNORMAL HIGH (ref 32.0–36.0)
MCV: 86.4 fL (ref 80.0–100.0)
MONO ABS: 0.7 10*3/uL (ref 0.2–0.9)
Monocytes Relative: 6 %
NEUTROS ABS: 7.7 10*3/uL — AB (ref 1.4–6.5)
NEUTROS PCT: 72 %
Platelets: 178 10*3/uL (ref 150–440)
RBC: 4.67 MIL/uL (ref 3.80–5.20)
RDW: 14.3 % (ref 11.5–14.5)
WBC: 10.7 10*3/uL (ref 3.6–11.0)

## 2017-02-22 MED ORDER — IPRATROPIUM-ALBUTEROL 0.5-2.5 (3) MG/3ML IN SOLN
3.0000 mL | Freq: Once | RESPIRATORY_TRACT | Status: AC
  Administered 2017-02-22: 3 mL via RESPIRATORY_TRACT
  Filled 2017-02-22: qty 3

## 2017-02-22 MED ORDER — HYDROCOD POLST-CPM POLST ER 10-8 MG/5ML PO SUER
5.0000 mL | Freq: Once | ORAL | Status: AC
  Administered 2017-02-22: 5 mL via ORAL
  Filled 2017-02-22: qty 5

## 2017-02-22 MED ORDER — METHYLPREDNISOLONE SODIUM SUCC 125 MG IJ SOLR
125.0000 mg | Freq: Once | INTRAMUSCULAR | Status: AC
  Administered 2017-02-22: 125 mg via INTRAVENOUS
  Filled 2017-02-22: qty 2

## 2017-02-22 NOTE — ED Provider Notes (Signed)
[facility] Emergency Department Provider Note   ____________________________________________   First MD Initiated Contact with Patient 02/22/17 2308     (approximate)  I have reviewed the triage vital signs and the nursing notes.   HISTORY  Chief Complaint Shortness of Breath    HPI Susan Gilmore is a 51 y.o. female who presents to the ED from home with a chief complaint of cough, congestion and shortness of breath. Patient has multiple medical problems including COPD, not on continuous oxygenation, who complains of a several day history of nasal and chest congestion, nonproductive cough, wheezing and shortness of breath. Reports chest pain on coughing. Has been using her nebulizer more over the past 2 days. Denies associated fever, chills, abdominal pain, nausea, vomiting.Admits she did not take her antihypertensive today. Denies recent travel or trauma. Nothing makes her symptoms better or worse.  + Sick contacts.   Past Medical History:  Diagnosis Date  . Abnormal Pap smear 04/28/2012   normal pap /positive hpv   . Abnormal stress test   . Allergic rhinitis   . Bipolar disorder ([place])   . Carpal tunnel syndrome   . Chest pain   . Chronic anxiety   . Contraception   . Cough   . HPV (human papilloma virus) anogenital infection   . HTN (hypertension)   . Hyperplastic colon polyp   . Insomnia   . Palpitations   . Pneumonia     Patient Active Problem List   Diagnosis Date Noted  . Encounter for chronic pain management 12/13/2015  . Chronic pain 12/13/2015  . Respiratory failure ([place]) 11/08/2015  . Asthma 06/05/2015  . Rheumatoid arthritis ([place]) 06/05/2015  . 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 Allergic rhinitis 03/15/2015  . ADD (attention deficit disorder) 12/29/2014  . 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 Back ache 12/29/2014  . Bipolar disorder ([place]) 12/29/2014  . Carpal tunnel syndrome 12/29/2014  .  Chest pain 12/29/2014  . Chronic anxiety 12/29/2014  . Elevated blood sugar 12/29/2014  . Elevated rheumatoid factor 12/29/2014  . Acid reflux 12/29/2014  . Infectious human wart virus 12/29/2014  . Benign neoplasm of colon 12/29/2014  . Cephalalgia 12/29/2014  . Cannot sleep 12/29/2014  . Awareness of heartbeats 12/29/2014  . Flutter-fibrillation 12/29/2014  . LAD (lymphadenopathy), retroperitoneal 12/29/2014  . Fast heart beat 12/29/2014  . Systolic dysfunction 69/62/9528  . Avitaminosis D 12/29/2014  . Paroxysmal digital cyanosis 02/17/2014  . Ventricular bigeminy 01/14/2012  . Hypertension 01/14/2012  . Abnormal EKG 01/14/2012  . Tobacco abuse 01/14/2012  . Benign essential HTN 12/09/2003    Past Surgical History:  Procedure Laterality Date  . full mouth dental    . GANGLION CYST EXCISION    . WISDOM TOOTH EXTRACTION      Prior to Admission medications   Medication Sig Start Date End Date Taking? Authorizing Provider  albuterol (PROAIR HFA) 108 (90 Base) MCG/ACT inhaler Inhale 1-2 puffs into the lungs every 6 (six) hours as needed for wheezing or shortness of breath. 01/01/16   [name], PA-C  ALPRAZolam [name]) 1 MG tablet Take 1 mg by mouth 4 (four) times daily as needed for anxiety.     [provider]  amphetamine-dextroamphetamine (ADDERALL XR) 30 MG 24 hr capsule Take 60 mg by mouth daily. 05/05/15   [provider]  B-D TB SYRINGE 1CC/27GX1/2" 27G X 1/2" 1 ML MISC USE TO INJECT METHOTREXATE WEEKLY 30 DAYS 04/20/15   [provider]  budesonide (PULMICORT) 0.25 MG/2ML nebulizer solution Take 2 mLs (0.25 mg total) by nebulization every 6 (six) hours as needed (for shortness of breath). Patient not taking: Reported on 02/13/2017 01/01/16   [name], PA-C  doxepin (SINEQUAN) 25 MG capsule Take 50 mg by mouth at bedtime.    [provider]  DULoxetine (CYMBALTA) 60 MG capsule Take 120 mg by mouth daily.     [provider]  Eszopiclone 3 MG TABS Take 3 mg by mouth at bedtime as needed (sleep). Reported on 12/13/2015 03/16/15   [provider]  fexofenadine (ALLEGRA) 180 MG tablet Take 180 mg by mouth daily.    [provider]  fluticasone (FLONASE) 50 MCG/ACT nasal spray Place 2 sprays into both nostrils daily. 03/15/15   [name], PA-C  Fluticasone-Salmeterol (ADVAIR) 250-50 MCG/DOSE AEPB Inhale 1 puff into the lungs 2 (two) times daily.    [provider]  folic acid (FOLVITE) 1 MG tablet Take 1 tablet by mouth 2 (two) times daily.    [provider]  hydrochlorothiazide (HYDRODIURIL) 12.5 MG tablet Take 1 tablet by mouth daily. Reported on 12/13/2015 10/17/14   [provider]  hydroxychloroquine (PLAQUENIL) 200 MG tablet TAKE 2 TABLETS BY MOUTH WITH FOOD OR MILK ONCE A DAY 10/16/15   [provider]  ipratropium-albuterol (DUONEB) 0.5-2.5 (3) MG/3ML SOLN Take 3 mLs by nebulization every 6 (six) hours as needed (shortness of breath).    [provider]  loratadine (CLARITIN) 10 MG tablet Take 1 tablet by mouth daily. 12/19/14   [provider]  medroxyPROGESTERone (DEPO-PROVERA) 150 MG/ML injection Inject 1 mL (150 mg total) into the muscle every 3 (three) months. 12/13/15   [name], PA-C  methotrexate 50 MG/2ML injection Inject 20 mg into the skin once a week.     [provider]  montelukast (SINGULAIR) 10 MG tablet Take 1 tablet by mouth daily. 12/19/14   [provider]  nystatin (MYCOSTATIN) 100000 UNIT/ML suspension Take 5 mLs (500,000 Units total) by mouth 4 (four) times daily. Patient not taking: Reported on 02/13/2017 10/30/15   [name], PA-C  omeprazole (PRILOSEC) 20 MG capsule Take 1 capsule (20 mg total) by mouth daily. Patient taking differently: Take 20 mg by mouth 2 (two) times daily before a meal.  03/21/15   Burnette, Clearnce Sorrel, PA-C  Oxycodone HCl 10 MG TABS Take 1  tablet (10 mg total) by mouth every 6 (six) hours as needed. For severe pain 02/13/17   [name], PA-C  propranolol ER (INDERAL LA) 160 MG SR capsule Take 1 capsule (160 mg total) by mouth daily. Reported on 12/13/2015 02/22/16   [name], PA-C    Allergies Effexor [venlafaxine hcl] and Isosorbide nitrate  Family History  Problem Relation Age of Onset  . Stroke Paternal Grandmother   . Heart disease Paternal Grandmother   . Diabetes Maternal Grandmother   . Heart disease Maternal Grandmother   . Hypertension Maternal Grandmother   . Heart disease Maternal Grandfather   . Hypertension Maternal Grandfather   . Heart disease Father   . Stroke Father   . Hypertension Father   . Hyperlipidemia Father   . Diabetes Mother   . Heart attack Mother     Social History Social History  Substance Use Topics  . Smoking status: Former Smoker    Packs/day: 1.00    Years: 26.00    Types: Cigarettes    Quit  date: 07/26/2013  . Smokeless tobacco: Never Used  . Alcohol use No     Comment: Had a history of alcohol use but currently is not drinking any    Review of Systems  Constitutional: No fever/chills. Eyes: No visual changes. ENT: No sore throat. Cardiovascular: Positive for chest pain. Respiratory: Positive for cough, wheezing and shortness of breath. Gastrointestinal: No abdominal pain.  No nausea, no vomiting.  No diarrhea.  No constipation. Genitourinary: Negative for dysuria. Musculoskeletal: Negative for back pain. Skin: Negative for rash. Neurological: Negative for headaches, focal weakness or numbness.   ____________________________________________   PHYSICAL EXAM:  VITAL SIGNS: ED Triage Vitals  Enc Vitals Group     BP 02/22/17 1915 (!) 151/117     Pulse Rate 02/22/17 1915 68     Resp 02/22/17 1915 17     Temp 02/22/17 1915 98.6 F (37 C)     Temp Source 02/22/17 1915 Oral     SpO2 02/22/17 1915 96 %     Weight 02/22/17 1915 184 lb (83.5  kg)     Height 02/22/17 1915 5\' 1"  (1.549 m)     Head Circumference --      Peak Flow --      Pain Score 02/22/17 1921 0     Pain Loc --      Pain Edu? --      Excl. in [place]? --     Constitutional: Alert and oriented. Well appearing and in no acute distress. Eyes: Conjunctivae are normal. PERRL. EOMI. Head: Atraumatic. Nose: No congestion/rhinnorhea. Mouth/Throat: Mucous membranes are moist.  Oropharynx non-erythematous. Neck: No stridor.   Cardiovascular: Normal rate, regular rhythm. Grossly normal heart sounds.  Good peripheral circulation. Respiratory: Normal respiratory effort.  No retractions. Lungs diminished with wheezing bibasilarly. Gastrointestinal: Soft and nontender. No distention. No abdominal bruits. No CVA tenderness. Musculoskeletal: No lower extremity tenderness nor edema.  No joint effusions. Neurologic:  Normal speech and language. No gross focal neurologic deficits are appreciated. No gait instability. Skin:  Skin is warm, dry and intact. No rash noted. Psychiatric: Mood and affect are normal. Speech and behavior are normal.  ____________________________________________   LABS (all labs ordered are listed, but only abnormal results are displayed)  Labs Reviewed  CBC WITH DIFFERENTIAL/PLATELET  BASIC METABOLIC PANEL  TROPONIN I   ____________________________________________  EKG  ED ECG REPORT I, SUNG,JADE J, the attending physician, personally viewed and interpreted this ECG.   Date: 02/22/2017  EKG Time: 1922  Rate: 72  Rhythm: normal EKG, normal sinus rhythm  Axis: Normal  Intervals:none  ST&T Change: Inverted T waves inferior laterally No significant change from May 2017  ____________________________________________  RADIOLOGY  Dg Chest 2 View  Result Date: 02/22/2017 CLINICAL DATA:  Shortness of breath EXAM: CHEST  2 VIEW COMPARISON:  February 10, 2017 FINDINGS: The heart, hila, and mediastinum are normal. No pneumothorax. No pulmonary nodules  or masses. Mild atelectasis in the left base/ lingula. No suspicious infiltrate. IMPRESSION: No active cardiopulmonary disease. Electronically Signed   By: Dorise Bullion III M.D   On: 02/22/2017 19:41    ____________________________________________   PROCEDURES  Procedure(s) performed: None  Procedures  Critical Care performed: No  ____________________________________________   INITIAL IMPRESSION / ASSESSMENT AND PLAN / ED COURSE  Pertinent labs & imaging results that were available during my care of the patient were reviewed by me and considered in my medical decision making (see chart for details).  51 year old female who presents with bronchitis and COPD  exacerbation. No pneumonia seen on chest x-ray. Repeat blood pressure 145/91. Given patient's complex medical issues, we'll obtain blood work including troponin, administered DuoNeb, Solu-Medrol and reassess.  Clinical Course as of Feb 23 633  Sun Feb 23, 2017  0036 Aeration improved; wheezing now louder and more diffuse. Will administer second DuoNeb. Will replete potassium.  [JS]    Clinical Course User Index [JS] Paulette Blanch, MD     ____________________________________________   FINAL CLINICAL IMPRESSION(S) / ED DIAGNOSES  Final diagnoses:  COPD exacerbation ([place])  Essential hypertension      NEW MEDICATIONS STARTED DURING THIS VISIT:  New Prescriptions   No medications on file     Note:  This document was prepared using Dragon voice recognition software and may include unintentional dictation errors.    Paulette Blanch, MD 02/23/17 [phone]

## 2017-02-22 NOTE — ED Notes (Signed)
Spoke with dr. Archie Balboa regarding pt's hypertension, value of blood pressure and presenting symptoms. Order for chest xray and ekg received, no order for blood work received.

## 2017-02-22 NOTE — ED Triage Notes (Signed)
Pt states has been short of breath, non productive cough and congestion for "days". Pt appears in no acute distress, however pt has  Missed her HTN medication doseage. Pt also complains of ear pain.

## 2017-02-23 ENCOUNTER — Other Ambulatory Visit: Payer: Self-pay | Admitting: Physician Assistant

## 2017-02-23 DIAGNOSIS — Z87891 Personal history of nicotine dependence: Secondary | ICD-10-CM | POA: Diagnosis not present

## 2017-02-23 DIAGNOSIS — R001 Bradycardia, unspecified: Secondary | ICD-10-CM | POA: Diagnosis not present

## 2017-02-23 DIAGNOSIS — M069 Rheumatoid arthritis, unspecified: Secondary | ICD-10-CM | POA: Diagnosis not present

## 2017-02-23 DIAGNOSIS — I10 Essential (primary) hypertension: Secondary | ICD-10-CM | POA: Diagnosis not present

## 2017-02-23 DIAGNOSIS — J9621 Acute and chronic respiratory failure with hypoxia: Secondary | ICD-10-CM | POA: Diagnosis not present

## 2017-02-23 DIAGNOSIS — Z833 Family history of diabetes mellitus: Secondary | ICD-10-CM | POA: Diagnosis not present

## 2017-02-23 DIAGNOSIS — J441 Chronic obstructive pulmonary disease with (acute) exacerbation: Secondary | ICD-10-CM | POA: Diagnosis present

## 2017-02-23 DIAGNOSIS — Z888 Allergy status to other drugs, medicaments and biological substances status: Secondary | ICD-10-CM | POA: Diagnosis not present

## 2017-02-23 DIAGNOSIS — E876 Hypokalemia: Secondary | ICD-10-CM | POA: Diagnosis not present

## 2017-02-23 DIAGNOSIS — K219 Gastro-esophageal reflux disease without esophagitis: Secondary | ICD-10-CM | POA: Diagnosis not present

## 2017-02-23 DIAGNOSIS — F319 Bipolar disorder, unspecified: Secondary | ICD-10-CM | POA: Diagnosis not present

## 2017-02-23 DIAGNOSIS — Z8249 Family history of ischemic heart disease and other diseases of the circulatory system: Secondary | ICD-10-CM | POA: Diagnosis not present

## 2017-02-23 DIAGNOSIS — G4700 Insomnia, unspecified: Secondary | ICD-10-CM | POA: Diagnosis not present

## 2017-02-23 DIAGNOSIS — G8929 Other chronic pain: Secondary | ICD-10-CM | POA: Diagnosis not present

## 2017-02-23 DIAGNOSIS — Z86010 Personal history of colonic polyps: Secondary | ICD-10-CM | POA: Diagnosis not present

## 2017-02-23 DIAGNOSIS — Z79899 Other long term (current) drug therapy: Secondary | ICD-10-CM | POA: Diagnosis not present

## 2017-02-23 DIAGNOSIS — Z7951 Long term (current) use of inhaled steroids: Secondary | ICD-10-CM | POA: Diagnosis not present

## 2017-02-23 DIAGNOSIS — F419 Anxiety disorder, unspecified: Secondary | ICD-10-CM | POA: Diagnosis not present

## 2017-02-23 DIAGNOSIS — Z3040 Encounter for surveillance of contraceptives, unspecified: Secondary | ICD-10-CM

## 2017-02-23 LAB — BASIC METABOLIC PANEL
ANION GAP: 7 (ref 5–15)
BUN: 11 mg/dL (ref 6–20)
CALCIUM: 8.8 mg/dL — AB (ref 8.9–10.3)
CO2: 27 mmol/L (ref 22–32)
Chloride: 105 mmol/L (ref 101–111)
Creatinine, Ser: 0.94 mg/dL (ref 0.44–1.00)
GFR calc Af Amer: >60 mL/min (ref >60)
GLUCOSE: 110 mg/dL — AB (ref 65–99)
POTASSIUM: 2.7 mmol/L — AB (ref 3.5–5.1)
SODIUM: 139 mmol/L (ref 135–145)

## 2017-02-23 LAB — MAGNESIUM: MAGNESIUM: 1.9 mg/dL (ref 1.7–2.4)

## 2017-02-23 LAB — TROPONIN I: Troponin I: <0.03 ng/mL (ref <0.03)

## 2017-02-23 LAB — TSH: TSH: 2.352 u[IU]/mL (ref 0.350–4.500)

## 2017-02-23 MED ORDER — GUAIFENESIN-DM 100-10 MG/5ML PO SYRP
5.0000 mL | ORAL_SOLUTION | ORAL | Status: DC | PRN
  Administered 2017-02-24: 5 mL via ORAL
  Filled 2017-02-23 (×2): qty 5

## 2017-02-23 MED ORDER — DOXEPIN HCL 50 MG PO CAPS
50.0000 mg | ORAL_CAPSULE | Freq: Every evening | ORAL | Status: DC | PRN
  Filled 2017-02-23: qty 1

## 2017-02-23 MED ORDER — AMPHETAMINE-DEXTROAMPHET ER 30 MG PO CP24
60.0000 mg | ORAL_CAPSULE | Freq: Every day | ORAL | Status: DC
  Administered 2017-02-23 – 2017-02-24 (×2): 60 mg via ORAL
  Filled 2017-02-23 (×2): qty 2

## 2017-02-23 MED ORDER — ACETAMINOPHEN 650 MG RE SUPP: 650.0000 mg | Freq: Four times a day (QID) | RECTAL | Status: DC | PRN

## 2017-02-23 MED ORDER — PREDNISONE 20 MG PO TABS: 20.0000 mg | ORAL_TABLET | Freq: Every day | ORAL | Status: DC

## 2017-02-23 MED ORDER — BUDESONIDE 0.5 MG/2ML IN SUSP
0.5000 mg | Freq: Two times a day (BID) | RESPIRATORY_TRACT | Status: DC
  Administered 2017-02-23 – 2017-02-24 (×3): 0.5 mg via RESPIRATORY_TRACT
  Filled 2017-02-23 (×3): qty 2

## 2017-02-23 MED ORDER — LORATADINE 10 MG PO TABS
10.0000 mg | ORAL_TABLET | Freq: Every day | ORAL | Status: DC
  Administered 2017-02-23 – 2017-02-24 (×2): 10 mg via ORAL
  Filled 2017-02-23 (×2): qty 1

## 2017-02-23 MED ORDER — DULOXETINE HCL 30 MG PO CPEP
120.0000 mg | ORAL_CAPSULE | Freq: Every day | ORAL | Status: DC
  Administered 2017-02-23 – 2017-02-24 (×2): 120 mg via ORAL
  Filled 2017-02-23 (×2): qty 4

## 2017-02-23 MED ORDER — BENZONATATE 100 MG PO CAPS: 200.0000 mg | ORAL_CAPSULE | Freq: Three times a day (TID) | ORAL | Status: DC | PRN

## 2017-02-23 MED ORDER — POTASSIUM CHLORIDE CRYS ER 20 MEQ PO TBCR
60.0000 meq | EXTENDED_RELEASE_TABLET | Freq: Once | ORAL | Status: AC
  Administered 2017-02-23: 60 meq via ORAL
  Filled 2017-02-23: qty 3

## 2017-02-23 MED ORDER — DOCUSATE SODIUM 100 MG PO CAPS
100.0000 mg | ORAL_CAPSULE | Freq: Two times a day (BID) | ORAL | Status: DC
  Administered 2017-02-23 – 2017-02-24 (×3): 100 mg via ORAL
  Filled 2017-02-23 (×3): qty 1

## 2017-02-23 MED ORDER — PREDNISONE 20 MG PO TABS: 30.0000 mg | ORAL_TABLET | Freq: Every day | ORAL | Status: DC

## 2017-02-23 MED ORDER — OXYCODONE HCL 5 MG PO TABS: 10.0000 mg | ORAL_TABLET | Freq: Four times a day (QID) | ORAL | Status: DC | PRN

## 2017-02-23 MED ORDER — IPRATROPIUM-ALBUTEROL 0.5-2.5 (3) MG/3ML IN SOLN
3.0000 mL | Freq: Once | RESPIRATORY_TRACT | Status: AC
  Administered 2017-02-23: 3 mL via RESPIRATORY_TRACT
  Filled 2017-02-23: qty 3

## 2017-02-23 MED ORDER — PANTOPRAZOLE SODIUM 40 MG PO TBEC
40.0000 mg | DELAYED_RELEASE_TABLET | Freq: Every day | ORAL | Status: DC
  Administered 2017-02-23 – 2017-02-24 (×2): 40 mg via ORAL
  Filled 2017-02-23 (×2): qty 1

## 2017-02-23 MED ORDER — MOMETASONE FURO-FORMOTEROL FUM 200-5 MCG/ACT IN AERO
2.0000 | INHALATION_SPRAY | Freq: Two times a day (BID) | RESPIRATORY_TRACT | Status: DC
  Administered 2017-02-23: 08:00:00 2 via RESPIRATORY_TRACT
  Filled 2017-02-23: qty 8.8

## 2017-02-23 MED ORDER — PREDNISONE 20 MG PO TABS
40.0000 mg | ORAL_TABLET | Freq: Every day | ORAL | Status: AC
  Administered 2017-02-24: 10:00:00 40 mg via ORAL
  Filled 2017-02-23: qty 2

## 2017-02-23 MED ORDER — PREDNISONE 50 MG PO TABS
50.0000 mg | ORAL_TABLET | Freq: Every day | ORAL | Status: AC
  Administered 2017-02-23: 08:00:00 50 mg via ORAL
  Filled 2017-02-23: qty 1

## 2017-02-23 MED ORDER — PREDNISONE 10 MG PO TABS: 10.0000 mg | ORAL_TABLET | Freq: Every day | ORAL | Status: DC

## 2017-02-23 MED ORDER — AZITHROMYCIN 500 MG PO TABS
250.0000 mg | ORAL_TABLET | Freq: Every day | ORAL | Status: DC
  Administered 2017-02-24: 09:00:00 250 mg via ORAL
  Filled 2017-02-23: qty 1

## 2017-02-23 MED ORDER — POTASSIUM CHLORIDE IN NACL 20-0.9 MEQ/L-% IV SOLN
INTRAVENOUS | Status: DC
  Administered 2017-02-23: 05:00:00 via INTRAVENOUS
  Filled 2017-02-23 (×3): qty 1000

## 2017-02-23 MED ORDER — AZITHROMYCIN 500 MG PO TABS
500.0000 mg | ORAL_TABLET | Freq: Every day | ORAL | Status: AC
  Administered 2017-02-23: 11:00:00 500 mg via ORAL
  Filled 2017-02-23: qty 1

## 2017-02-23 MED ORDER — ALPRAZOLAM 1 MG PO TABS
1.0000 mg | ORAL_TABLET | Freq: Four times a day (QID) | ORAL | Status: DC | PRN
  Administered 2017-02-23: 05:00:00 1 mg via ORAL
  Filled 2017-02-23: qty 1

## 2017-02-23 MED ORDER — PROPRANOLOL HCL ER 80 MG PO CP24
160.0000 mg | ORAL_CAPSULE | Freq: Every day | ORAL | Status: DC
  Administered 2017-02-23: 160 mg via ORAL
  Filled 2017-02-23 (×2): qty 2

## 2017-02-23 MED ORDER — ONDANSETRON HCL 4 MG/2ML IJ SOLN: 4.0000 mg | Freq: Four times a day (QID) | INTRAMUSCULAR | Status: DC | PRN

## 2017-02-23 MED ORDER — FLUTICASONE PROPIONATE 50 MCG/ACT NA SUSP
2.0000 | Freq: Two times a day (BID) | NASAL | Status: DC
  Administered 2017-02-23 – 2017-02-24 (×2): 2 via NASAL
  Filled 2017-02-23: qty 16

## 2017-02-23 MED ORDER — ENOXAPARIN SODIUM 40 MG/0.4ML ~~LOC~~ SOLN
40.0000 mg | SUBCUTANEOUS | Status: DC
  Administered 2017-02-23: 21:00:00 40 mg via SUBCUTANEOUS
  Filled 2017-02-23: qty 0.4

## 2017-02-23 MED ORDER — ACETAMINOPHEN 325 MG PO TABS: 650.0000 mg | ORAL_TABLET | Freq: Four times a day (QID) | ORAL | Status: DC | PRN

## 2017-02-23 MED ORDER — MONTELUKAST SODIUM 10 MG PO TABS
10.0000 mg | ORAL_TABLET | Freq: Every day | ORAL | Status: DC
  Administered 2017-02-24: 10 mg via ORAL
  Filled 2017-02-23 (×2): qty 1

## 2017-02-23 MED ORDER — ONDANSETRON HCL 4 MG PO TABS: 4.0000 mg | ORAL_TABLET | Freq: Four times a day (QID) | ORAL | Status: DC | PRN

## 2017-02-23 MED ORDER — PREDNISONE 10 MG PO TABS: 5.0000 mg | ORAL_TABLET | Freq: Every day | ORAL | Status: DC

## 2017-02-23 MED ORDER — IPRATROPIUM-ALBUTEROL 0.5-2.5 (3) MG/3ML IN SOLN
3.0000 mL | Freq: Four times a day (QID) | RESPIRATORY_TRACT | Status: DC
  Administered 2017-02-23 (×3): 3 mL via RESPIRATORY_TRACT
  Filled 2017-02-23 (×3): qty 3

## 2017-02-23 MED ORDER — IPRATROPIUM-ALBUTEROL 0.5-2.5 (3) MG/3ML IN SOLN: 3.0000 mL | Freq: Four times a day (QID) | RESPIRATORY_TRACT | Status: DC | PRN

## 2017-02-23 NOTE — ED Notes (Signed)
Hospitalist at bedside, transport delayed 

## 2017-02-23 NOTE — H&P (Signed)
Susan Gilmore Warning is an 51 y.o. female.   Chief Complaint: Shortness of breath HPI: The patient with past medical history of COPD, hypertension and depression presents to the emergency department complaining of shortness of breath. The patient reports that her grandson has been ill with a URI that she believes has triggered her difficulty breathing. She has had nonproductive cough for the last 3 days. She's had his her albuterol nebulizer more frequently but was unable to get relief today. She denies chest pain except for when she coughs. In the emergency department she was found to have oxygen saturation less than 88% on room air. She received Solu-Medrol in the emergency department prior to the emergency department staff called the hospitalist service for admission.  Past Medical History:  Diagnosis Date  . Abnormal Pap smear 04/28/2012   normal pap /positive hpv   . Abnormal stress test   . Allergic rhinitis   . Bipolar disorder ([place])   . Carpal tunnel syndrome   . Chest pain   . Chronic anxiety   . Contraception   . Cough   . HPV (human papilloma virus) anogenital infection   . HTN (hypertension)   . Hyperplastic colon polyp   . Insomnia   . Palpitations   . Pneumonia     Past Surgical History:  Procedure Laterality Date  . full mouth dental    . GANGLION CYST EXCISION    . WISDOM TOOTH EXTRACTION      Family History  Problem Relation Age of Onset  . Stroke Paternal Grandmother   . Heart disease Paternal Grandmother   . Diabetes Maternal Grandmother   . Heart disease Maternal Grandmother   . Hypertension Maternal Grandmother   . Heart disease Maternal Grandfather   . Hypertension Maternal Grandfather   . Heart disease Father   . Stroke Father   . Hypertension Father   . Hyperlipidemia Father   . Diabetes Mother   . Heart attack Mother    Social History:  reports that she quit smoking about 3 years ago. Her smoking use included Cigarettes. She has a 26.00 pack-year  smoking history. She has never used smokeless tobacco. She reports that she does not drink alcohol or use drugs.  Allergies:  Allergies  Allergen Reactions  . Effexor [Venlafaxine Hcl] Swelling  . Isosorbide Nitrate Other (See Comments)    Reaction: Bad headache    Medications Prior to Admission  Medication Sig Dispense Refill  . albuterol (PROAIR HFA) 108 (90 Base) MCG/ACT inhaler Inhale 1-2 puffs into the lungs every 6 (six) hours as needed for wheezing or shortness of breath. 18 g 11  . ALPRAZolam (XANAX) 1 MG tablet Take 1 mg by mouth 4 (four) times daily as needed for anxiety.     [name] amphetamine-dextroamphetamine (ADDERALL XR) 30 MG 24 hr capsule Take 60 mg by mouth daily.  0  . doxepin (SINEQUAN) 25 MG capsule Take 50 mg by mouth at bedtime as needed (sleep).     . DULoxetine (CYMBALTA) 60 MG capsule Take 120 mg by mouth daily.     . fluticasone (FLONASE) 50 MCG/ACT nasal spray Place 2 sprays into both nostrils daily. (Patient taking differently: Place 2 sprays into both nostrils 2 (two) times daily. ) 16 g 11  . Fluticasone-Salmeterol (ADVAIR) 250-50 MCG/DOSE AEPB Inhale 1 puff into the lungs 2 (two) times daily.    . Investigational - Study Medication Inject 1 Dose into the muscle every 14 (fourteen) days. Study name: [facility] Additional  study details: study medication for RA    . ipratropium-albuterol (DUONEB) 0.5-2.5 (3) MG/3ML SOLN Take 3 mLs by nebulization every 6 (six) hours as needed (shortness of breath).    . loratadine (CLARITIN) 10 MG tablet Take 1 tablet by mouth daily.    . medroxyPROGESTERone (DEPO-PROVERA) 150 MG/ML injection Inject 1 mL (150 mg total) into the muscle every 3 (three) months. 1 mL 4  . montelukast (SINGULAIR) 10 MG tablet Take 1 tablet by mouth daily.    [name] omeprazole (PRILOSEC) 20 MG capsule Take 1 capsule (20 mg total) by mouth daily. (Patient taking differently: Take 20 mg by mouth 2 (two) times daily before a meal. ) 30 capsule 11  . Oxycodone HCl 10 MG  TABS Take 1 tablet (10 mg total) by mouth every 6 (six) hours as needed. For severe pain 180 tablet 0  . propranolol ER (INDERAL LA) 160 MG SR capsule Take 1 capsule (160 mg total) by mouth daily. Reported on 12/13/2015 90 capsule 3    Results for orders placed or performed during the hospital encounter of 02/22/17 (from the past 48 hour(s))  CBC with Differential     Status: Abnormal   Collection Time: 02/22/17 11:23 PM  Result Value Ref Range   WBC 10.7 3.6 - 11.0 K/uL   RBC 4.67 3.80 - 5.20 MIL/uL   Hemoglobin 14.7 12.0 - 16.0 g/dL    Comment: RESULT REPEATED AND VERIFIED   HCT 40.3 35.0 - 47.0 %   MCV 86.4 80.0 - 100.0 fL   MCH 31.6 26.0 - 34.0 pg   MCHC 36.6 (H) 32.0 - 36.0 g/dL   RDW 14.3 11.5 - 14.5 %   Platelets 178 150 - 440 K/uL   Neutrophils Relative % 72 %   Neutro Abs 7.7 (H) 1.4 - 6.5 K/uL   Lymphocytes Relative 16 %   Lymphs Abs 1.7 1.0 - 3.6 K/uL   Monocytes Relative 6 %   Monocytes Absolute 0.7 0.2 - 0.9 K/uL   Eosinophils Relative 5 %   Eosinophils Absolute 0.6 0 - 0.7 K/uL   Basophils Relative 1 %   Basophils Absolute 0.1 0 - 0.1 K/uL  Basic metabolic panel     Status: Abnormal   Collection Time: 02/22/17 11:23 PM  Result Value Ref Range   Sodium 139 135 - 145 mmol/L   Potassium 2.7 (LL) 3.5 - 5.1 mmol/L    Comment: CRITICAL RESULT CALLED TO, READ BACK BY AND VERIFIED WITH CHRISTINE MARTIN AT 0008 02/23/17.PMH   Chloride 105 101 - 111 mmol/L   CO2 27 22 - 32 mmol/L   Glucose, Bld 110 (H) 65 - 99 mg/dL   BUN 11 6 - 20 mg/dL   Creatinine, Ser 0.94 0.44 - 1.00 mg/dL   Calcium 8.8 (L) 8.9 - 10.3 mg/dL   GFR calc non Af Amer >60 >60 mL/min   GFR calc Af Amer >60 >60 mL/min    Comment: (NOTE) The eGFR has been calculated using the CKD EPI equation. This calculation has not been validated in all clinical situations. eGFR's persistently <60 mL/min signify possible Chronic Kidney Disease.    Anion gap 7 5 - 15  Troponin I     Status: None   Collection Time:  02/22/17 11:23 PM  Result Value Ref Range   Troponin I <0.03 <0.03 ng/mL  TSH     Status: None   Collection Time: 02/22/17 11:23 PM  Result Value Ref Range   TSH 2.352 0.350 -  4.500 uIU/mL    Comment: Performed by a 3rd Generation assay with a functional sensitivity of <=0.01 uIU/mL.   Dg Chest 2 View  Result Date: 02/22/2017 CLINICAL DATA:  Shortness of breath EXAM: CHEST  2 VIEW COMPARISON:  February 10, 2017 FINDINGS: The heart, hila, and mediastinum are normal. No pneumothorax. No pulmonary nodules or masses. Mild atelectasis in the left base/ lingula. No suspicious infiltrate. IMPRESSION: No active cardiopulmonary disease. Electronically Signed   By: Dorise Bullion III M.D   On: 02/22/2017 19:41    Review of Systems  Constitutional: Negative for chills and fever.  HENT: Negative for sore throat and tinnitus.   Eyes: Negative for blurred vision and redness.  Respiratory: Positive for cough and shortness of breath. Negative for sputum production and wheezing.   Cardiovascular: Negative for chest pain, palpitations, orthopnea and PND.  Gastrointestinal: Negative for abdominal pain, diarrhea, nausea and vomiting.  Genitourinary: Negative for dysuria, frequency and urgency.  Musculoskeletal: Negative for joint pain and myalgias.  Skin: Negative for rash.       No lesions  Neurological: Negative for speech change, focal weakness and weakness.  Endo/Heme/Allergies: Does not bruise/bleed easily.       No temperature intolerance  Psychiatric/Behavioral: Negative for depression and suicidal ideas.    Blood pressure (!) 155/63, pulse 75, temperature 98 F (36.7 C), temperature source Oral, resp. rate 20, height 5' (1.524 m), weight 86.8 kg (191 lb 4.8 oz), SpO2 96 %. Physical Exam  Vitals reviewed. Constitutional: She is oriented to person, place, and time. She appears well-developed and well-nourished. No distress.  HENT:  Head: Normocephalic and atraumatic.  Mouth/Throat: Oropharynx is  clear and moist.  Eyes: Conjunctivae and EOM are normal. Pupils are equal, round, and reactive to light. No scleral icterus.  Neck: Normal range of motion. Neck supple. No JVD present. No tracheal deviation present. No thyromegaly present.  Cardiovascular: Normal rate, regular rhythm and normal heart sounds.  Exam reveals no gallop and no friction rub.   No murmur heard. Respiratory: Effort normal and breath sounds normal.  GI: Soft. Bowel sounds are normal. She exhibits no distension. There is no tenderness.  Genitourinary:  Genitourinary Comments: Deferred  Musculoskeletal: Normal range of motion. She exhibits no edema.  Lymphadenopathy:    She has no cervical adenopathy.  Neurological: She is alert and oriented to person, place, and time. No cranial nerve deficit. She exhibits normal muscle tone.  Skin: Skin is warm and dry. No rash noted. No erythema.  Psychiatric: She has a normal mood and affect. Her behavior is normal. Judgment and thought content normal.     Assessment/Plan This is a 51 year old female admitted for acute on chronic respiratory failure. 1. Acute on chronic respiratory failure: With hypoxia; steroid taper as well as azithromycin for anti-inflammatory effect. Supplemental oxygen as needed for oxygen saturations 88-92% 2. COPD: Exacerbation; continue inhaled corticosteroid and duonebs. 3. Hypokalemia: Secondary to chronic albuterol use. Replete potassium. No arrhythmias on telemetry. 4. Hypertension: Continue propranolol 5. Depression: Continue doxepin and Cymbalta 6. DVT prophylaxis: Lovenox 7. GI prophylaxis: Pantoprazole per home regimen The patient is a full code. Time spent on admission orders and patient care approximately 45 minutes  Harrie Foreman, MD 02/23/2017, 8:26 AM

## 2017-02-23 NOTE — Progress Notes (Signed)
[facility] NAME: Susan Gilmore    MR#:  [id number]  DATE OF BIRTH:  1966-02-12  SUBJECTIVE:   Patient here due to shortness of breath and noted to be in acute respiratory failure with hypoxia secondary to COPD exacerbation. Still feels somewhat short of breath today. No other acute complaints presently.  REVIEW OF SYSTEMS:    Review of Systems  Constitutional: Negative for chills and fever.  HENT: Negative for congestion and tinnitus.   Eyes: Negative for blurred vision and double vision.  Respiratory: Positive for cough and shortness of breath. Negative for wheezing.   Cardiovascular: Negative for chest pain, orthopnea and PND.  Gastrointestinal: Negative for abdominal pain, diarrhea, nausea and vomiting.  Genitourinary: Negative for dysuria and hematuria.  Neurological: Negative for dizziness, sensory change and focal weakness.  All other systems reviewed and are negative.   Nutrition: Heart Healthy Tolerating Diet: yes Tolerating PT: Ambulatory   DRUG ALLERGIES:   Allergies  Allergen Reactions  . Effexor [Venlafaxine Hcl] Swelling  . Isosorbide Nitrate Other (See Comments)    Reaction: Bad headache    VITALS:  Blood pressure (!) 155/63, pulse 75, temperature 98 F (36.7 C), temperature source Oral, resp. rate 20, height 5' (1.524 m), weight 86.8 kg (191 lb 4.8 oz), SpO2 96 %.  PHYSICAL EXAMINATION:   Physical Exam  GENERAL:  51 y.o.-year-old patient lying in bed in no acute distress.  EYES: Pupils equal, round, reactive to light and accommodation. No scleral icterus. Extraocular muscles intact.  HEENT: Head atraumatic, normocephalic. Oropharynx and nasopharynx clear.  NECK:  Supple, no jugular venous distention. No thyroid enlargement, no tenderness.  LUNGS: Normal breath sounds bilaterally, no wheezing, rales, rhonchi. No use of accessory muscles of respiration.  CARDIOVASCULAR: S1, S2 normal. No murmurs, rubs,  or gallops.  ABDOMEN: Soft, nontender, nondistended. Bowel sounds present. No organomegaly or mass.  EXTREMITIES: No cyanosis, clubbing or edema b/l.    NEUROLOGIC: Cranial nerves II through XII are intact. No focal Motor or sensory deficits b/l.   PSYCHIATRIC: The patient is alert and oriented x 3.  SKIN: No obvious rash, lesion, or ulcer.    LABORATORY PANEL:   CBC  Recent Labs Lab 02/22/17 2323  WBC 10.7  HGB 14.7  HCT 40.3  PLT 178   ------------------------------------------------------------------------------------------------------------------  Chemistries   Recent Labs Lab 02/22/17 2323  NA 139  K 2.7*  CL 105  CO2 27  GLUCOSE 110*  BUN 11  CREATININE 0.94  CALCIUM 8.8*  MG 1.9   ------------------------------------------------------------------------------------------------------------------  Cardiac Enzymes  Recent Labs Lab 02/22/17 2323  TROPONINI <0.03   ------------------------------------------------------------------------------------------------------------------  RADIOLOGY:  Dg Chest 2 View  Result Date: 02/22/2017 CLINICAL DATA:  Shortness of breath EXAM: CHEST  2 VIEW COMPARISON:  February 10, 2017 FINDINGS: The heart, hila, and mediastinum are normal. No pneumothorax. No pulmonary nodules or masses. Mild atelectasis in the left base/ lingula. No suspicious infiltrate. IMPRESSION: No active cardiopulmonary disease. Electronically Signed   By: Dorise Bullion III M.D   On: 02/22/2017 19:41     ASSESSMENT AND PLAN:   51 year old female with past medical history of rheumatoid arthritis, anxiety, ADHD, depression, COPD, bipolar disorder, hypertension who presented to the hospital due to shortness of breath and cough and noted to be in acute respiratory failure with hypoxia.   1. Acute respiratory failure with hypoxia-secondary to COPD exacerbation. -Continue O2 supplementation and weaning as tolerated. Treat underlying COPD with prednisone taper,  scheduled DuoNeb's, Pulmicort nebs.  2. COPD exacerbation-cause of patient's acute respiratory failure with hypoxia. -Chest x-ray negative for acute pathology. Continue on his own taper, will place on scheduled DuoNeb's, Pulmicort nebs. Wean O2 as tolerated. Likely discharge home with neck 1-2 days.  3. Anxiety/depression-continue Xanax, Cymbalta.  4. Chronic pain-continue oxycodone as needed.  5. Hypokalemia-has been supplemented and repeat level in the morning. Check magnesium level in the morning 2. Next  6. GERD-continue Protonix.    All the records are reviewed and case discussed with Care Management/Social Worker. Management plans discussed with the patient, family and they are in agreement.  CODE STATUS: Full code  DVT Prophylaxis: Lovenox  TOTAL TIME TAKING CARE OF THIS PATIENT: 30 minutes.   POSSIBLE D/C IN 1-2 DAYS, DEPENDING ON CLINICAL CONDITION.   Henreitta Leber M.D on 02/23/2017 at 10:46 AM  Between 7am to 6pm - Pager - [phone]  After 6pm go to [email or web address] - [employer] [facility]  Office  [phone]  CC: Primary care physician; [name], PA-C

## 2017-02-23 NOTE — Progress Notes (Signed)
SATURATION QUALIFICATIONS: (This note is used to comply with regulatory documentation for home oxygen)  Patient Saturations on Room Air at Rest = 94%  Patient Saturations on Room Air while Ambulating = 93%  Patient Saturations on 0 Liters of oxygen while Ambulating = 92%-94%  Please briefly explain why patient needs home oxygen:

## 2017-02-24 ENCOUNTER — Telehealth: Payer: Self-pay | Admitting: Physician Assistant

## 2017-02-24 ENCOUNTER — Ambulatory Visit: Payer: Self-pay | Admitting: Physician Assistant

## 2017-02-24 DIAGNOSIS — J441 Chronic obstructive pulmonary disease with (acute) exacerbation: Secondary | ICD-10-CM | POA: Diagnosis not present

## 2017-02-24 LAB — MAGNESIUM: Magnesium: 1.9 mg/dL (ref 1.7–2.4)

## 2017-02-24 LAB — HEMOGLOBIN A1C
Hgb A1c MFr Bld: 4.7 % — ABNORMAL LOW (ref 4.8–5.6)
Mean Plasma Glucose: 88 mg/dL

## 2017-02-24 LAB — HIV ANTIBODY (ROUTINE TESTING W REFLEX): HIV Screen 4th Generation wRfx: NONREACTIVE

## 2017-02-24 LAB — POTASSIUM: Potassium: 3 mmol/L — ABNORMAL LOW (ref 3.5–5.1)

## 2017-02-24 MED ORDER — IPRATROPIUM-ALBUTEROL 0.5-2.5 (3) MG/3ML IN SOLN
3.0000 mL | Freq: Three times a day (TID) | RESPIRATORY_TRACT | Status: DC
  Administered 2017-02-24: 3 mL via RESPIRATORY_TRACT
  Filled 2017-02-24: qty 3

## 2017-02-24 MED ORDER — PREDNISONE 10 MG PO TABS: ORAL_TABLET | ORAL | 0 refills | Status: DC

## 2017-02-24 MED ORDER — PROPRANOLOL HCL ER 80 MG PO CP24
80.0000 mg | ORAL_CAPSULE | Freq: Every day | ORAL | Status: DC
  Administered 2017-02-24: 10:00:00 80 mg via ORAL
  Filled 2017-02-24: qty 1

## 2017-02-24 MED ORDER — IPRATROPIUM-ALBUTEROL 0.5-2.5 (3) MG/3ML IN SOLN: 3.0000 mL | Freq: Four times a day (QID) | RESPIRATORY_TRACT | 0 refills | Status: DC | PRN

## 2017-02-24 MED ORDER — IPRATROPIUM-ALBUTEROL 0.5-2.5 (3) MG/3ML IN SOLN: 3.0000 mL | Freq: Four times a day (QID) | RESPIRATORY_TRACT | Status: DC | PRN

## 2017-02-24 MED ORDER — POTASSIUM CHLORIDE ER 20 MEQ PO TBCR: 20.0000 meq | EXTENDED_RELEASE_TABLET | Freq: Two times a day (BID) | ORAL | 0 refills | Status: DC

## 2017-02-24 NOTE — Discharge Summary (Signed)
[facility] NAME: Susan Gilmore    MR#:  [id number]  DATE OF BIRTH:  1966-07-15  DATE OF ADMISSION:  02/22/2017 ADMITTING PHYSICIAN: Harrie Foreman, MD  DATE OF DISCHARGE: 02/24/2017 12:30 PM  PRIMARY CARE PHYSICIAN: [name], PA-C    ADMISSION DIAGNOSIS:  Hypokalemia [E87.6] COPD exacerbation ([place]) [J44.1] Essential hypertension [I10]  DISCHARGE DIAGNOSIS:  Active Problems:   Acute on chronic respiratory failure with hypoxia (HCC)   SECONDARY DIAGNOSIS:   Past Medical History:  Diagnosis Date  . Abnormal Pap smear 04/28/2012   normal pap /positive hpv   . Abnormal stress test   . Allergic rhinitis   . Bipolar disorder ([place])   . Carpal tunnel syndrome   . Chest pain   . Chronic anxiety   . Contraception   . Cough   . HPV (human papilloma virus) anogenital infection   . HTN (hypertension)   . Hyperplastic colon polyp   . Insomnia   . Palpitations   . Pneumonia     HOSPITAL COURSE:   51 year old female with past medical history of rheumatoid arthritis, anxiety, ADHD, depression, COPD, bipolar disorder, hypertension who presented to the hospital due to shortness of breath and cough and noted to be in acute respiratory failure with hypoxia.   1. Acute respiratory failure with hypoxia- this was secondary to COPD exacerbation. -Patient was admitted to the hospital and given scheduled DuoNeb's, Pulmicort nebs and also given prednisone. -Patient shortness of breath has significantly improved. She was ambulated did not drop her O2 sats below 88% and did not qualify for oxygen. She feels better and therefore being discharged home.  2. COPD exacerbation-cause of patient's acute respiratory failure with hypoxia. -Chest x-ray was negative for acute pathology. Patient was treated with scheduled on nebs, Pulmicort nebs, oral prednisone taper. She has improved she still has some wheezing bronchospasm but  significantly improved since admission. She did not qualify for home oxygen. She is being discharged home on her Advair, DuoNeb nebs as needed.   3. Anxiety/depression- she will continue Xanax, Cymbalta.  4. Chronic pain- she will continue oxycodone as needed.  5. Hypokalemia- it improved a bit with supplementation and pt. Was discharged on Oral potassium supplements for a few days.   6. GERD- pt. Will continue Protonix.  7. Essential HTN - patient's blood pressure remained stable she will continue her propranolol. She was noted to be somewhat bradycardic and was advised to talk to her primary care physician for further adjustment to her blood pressure regimen. She'll continue her propranolol for now.   DISCHARGE CONDITIONS:   Stable  CONSULTS OBTAINED:    DRUG ALLERGIES:   Allergies  Allergen Reactions  . Effexor [Venlafaxine Hcl] Swelling  . Isosorbide Nitrate Other (See Comments)    Reaction: Bad headache    DISCHARGE MEDICATIONS:   Allergies as of 02/24/2017      Reactions   Effexor [venlafaxine Hcl] Swelling   Isosorbide Nitrate Other (See Comments)   Reaction: Bad headache      Medication List    TAKE these medications   albuterol 108 (90 Base) MCG/ACT inhaler Commonly known as:  PROAIR HFA Inhale 1-2 puffs into the lungs every 6 (six) hours as needed for wheezing or shortness of breath.   ALPRAZolam 1 MG tablet Commonly known as:  XANAX Take 1 mg by mouth 4 (four) times daily as needed for anxiety.   amphetamine-dextroamphetamine 30 MG 24 hr capsule Commonly  known as:  ADDERALL XR Take 60 mg by mouth daily.   doxepin 25 MG capsule Commonly known as:  SINEQUAN Take 50 mg by mouth at bedtime as needed (sleep).   DULoxetine 60 MG capsule Commonly known as:  CYMBALTA Take 120 mg by mouth daily.   fluticasone 50 MCG/ACT nasal spray Commonly known as:  FLONASE Place 2 sprays into both nostrils daily. What changed:  when to take this    Fluticasone-Salmeterol 250-50 MCG/DOSE Aepb Commonly known as:  ADVAIR Inhale 1 puff into the lungs 2 (two) times daily.   Investigational - Study Medication Inject 1 Dose into the muscle every 14 (fourteen) days. Study name: Credo Additional study details: study medication for RA   ipratropium-albuterol 0.5-2.5 (3) MG/3ML Soln Commonly known as:  DUONEB Take 3 mLs by nebulization every 6 (six) hours as needed (shortness of breath).   loratadine 10 MG tablet Commonly known as:  CLARITIN Take 1 tablet by mouth daily.   medroxyPROGESTERone 150 MG/ML injection Commonly known as:  DEPO-PROVERA INJECT 1 ML (150 MG TOTAL) INTO THE MUSCLE EVERY 3 (THREE) MONTHS.   montelukast 10 MG tablet Commonly known as:  SINGULAIR Take 1 tablet by mouth daily.   omeprazole 20 MG capsule Commonly known as:  PRILOSEC Take 1 capsule (20 mg total) by mouth daily. What changed:  when to take this   Oxycodone HCl 10 MG Tabs Take 1 tablet (10 mg total) by mouth every 6 (six) hours as needed. For severe pain   Potassium Chloride ER 20 MEQ Tbcr Take 20 mEq by mouth 2 (two) times daily.   predniSONE 10 MG tablet Commonly known as:  DELTASONE Label  & dispense according to the schedule below. 5 Pills PO for 1 day then, 4 Pills PO for 1 day, 3 Pills PO for 1 day, 2 Pills PO for 1 day, 1 Pill PO for 1 days then STOP.   propranolol ER 160 MG SR capsule Commonly known as:  INDERAL LA Take 1 capsule (160 mg total) by mouth daily. Reported on 12/13/2015         DISCHARGE INSTRUCTIONS:   DIET:  Cardiac diet  DISCHARGE CONDITION:  Stable  ACTIVITY:  Activity as tolerated  OXYGEN:  Home Oxygen: No.   Oxygen Delivery: room air  DISCHARGE LOCATION:  home   If you experience worsening of your admission symptoms, develop shortness of breath, life threatening emergency, suicidal or homicidal thoughts you must seek medical attention immediately by calling 911 or calling your MD immediately  if  symptoms less severe.  You Must read complete instructions/literature along with all the possible adverse reactions/side effects for all the Medicines you take and that have been prescribed to you. Take any new Medicines after you have completely understood and accpet all the possible adverse reactions/side effects.   Please note  You were cared for by a hospitalist during your hospital stay. If you have any questions about your discharge medications or the care you received while you were in the hospital after you are discharged, you can call the unit and asked to speak with the hospitalist on call if the hospitalist that took care of you is not available. Once you are discharged, your primary care physician will handle any further medical issues. Please note that NO REFILLS for any discharge medications will be authorized once you are discharged, as it is imperative that you return to your primary care physician (or establish a relationship with a primary care physician if  you do not have one) for your aftercare needs so that they can reassess your need for medications and monitor your lab values.     Today   Shortness of breath, wheezing much improved.  No other acute events overnight. Feels better and wants to go home  VITAL SIGNS:  Blood pressure (!) 171/87, pulse (!) 53, temperature 97.9 F (36.6 C), temperature source Oral, resp. rate 20, height 5' (1.524 m), weight 86.8 kg (191 lb 4.8 oz), SpO2 95 %.  I/O:   Intake/Output Summary (Last 24 hours) at 02/24/17 1539 Last data filed at 02/23/17 1800  Gross per 24 hour  Intake              240 ml  Output                0 ml  Net              240 ml    PHYSICAL EXAMINATION:  GENERAL:  51 y.o.-year-old patient lying in the bed with no acute distress.  EYES: Pupils equal, round, reactive to light and accommodation. No scleral icterus. Extraocular muscles intact.  HEENT: Head atraumatic, normocephalic. Oropharynx and nasopharynx clear.   NECK:  Supple, no jugular venous distention. No thyroid enlargement, no tenderness.  LUNGS: Normal breath sounds bilaterally, minimal end-exp. wheezing, No rales,rhonchi. No use of accessory muscles of respiration.  CARDIOVASCULAR: S1, S2 normal. No murmurs, rubs, or gallops.  ABDOMEN: Soft, non-tender, non-distended. Bowel sounds present. No organomegaly or mass.  EXTREMITIES: No pedal edema, cyanosis, or clubbing.  NEUROLOGIC: Cranial nerves II through XII are intact. No focal motor or sensory defecits b/l.  PSYCHIATRIC: The patient is alert and oriented x 3.  SKIN: No obvious rash, lesion, or ulcer.   DATA REVIEW:   CBC  Recent Labs Lab 02/22/17 2323  WBC 10.7  HGB 14.7  HCT 40.3  PLT 178    Chemistries   Recent Labs Lab 02/22/17 2323 02/24/17 1013  NA 139  --   K 2.7* 3.0*  CL 105  --   CO2 27  --   GLUCOSE 110*  --   BUN 11  --   CREATININE 0.94  --   CALCIUM 8.8*  --   MG 1.9 1.9    Cardiac Enzymes  Recent Labs Lab 02/22/17 2323  TROPONINI <0.03    Microbiology Results  No results found for this or any previous visit.  RADIOLOGY:  Dg Chest 2 View  Result Date: 02/22/2017 CLINICAL DATA:  Shortness of breath EXAM: CHEST  2 VIEW COMPARISON:  February 10, 2017 FINDINGS: The heart, hila, and mediastinum are normal. No pneumothorax. No pulmonary nodules or masses. Mild atelectasis in the left base/ lingula. No suspicious infiltrate. IMPRESSION: No active cardiopulmonary disease. Electronically Signed   By: Dorise Bullion III M.D   On: 02/22/2017 19:41      Management plans discussed with the patient, family and they are in agreement.  CODE STATUS:     Code Status Orders        Start     Ordered   02/23/17 0414  Full code  Continuous     02/23/17 0413    Code Status History    Date Active Date Inactive Code Status Order ID Comments User Context   11/08/2015  7:56 AM 11/09/2015  4:38 PM Full Code [id number]  Harrie Foreman, MD Inpatient       TOTAL TIME TAKING CARE OF THIS PATIENT: 40 minutes.  Henreitta Leber M.D on 02/24/2017 at 3:39 PM  Between 7am to 6pm - Pager - [phone]  After 6pm go to [email or web address] - [employer] [facility]  Office  [phone]  CC: Primary care physician; [name], PA-C

## 2017-02-24 NOTE — Telephone Encounter (Signed)
[facility] contacted office to schedule hospital f/u. Pt is being discharged today 02/24/17 and is scheduled to f/u with Jenni on 03/03/17 @ 2 pm. Thanks TNP

## 2017-02-24 NOTE — Progress Notes (Signed)
Pt is being discharged home. Discharge papers given and explained to pt. Pt verbalized understanding. Meds and f/u appointments reviewed with pt. RX given. 

## 2017-02-24 NOTE — Telephone Encounter (Signed)
Noted. Can we do Transition care call? Thanks!

## 2017-02-25 ENCOUNTER — Ambulatory Visit: Payer: Self-pay | Admitting: Physician Assistant

## 2017-02-25 ENCOUNTER — Telehealth: Payer: Self-pay | Admitting: Physician Assistant

## 2017-02-25 DIAGNOSIS — R059 Cough, unspecified: Secondary | ICD-10-CM

## 2017-02-25 DIAGNOSIS — R05 Cough: Secondary | ICD-10-CM

## 2017-02-25 MED ORDER — PSEUDOEPH-BROMPHEN-DM 30-2-10 MG/5ML PO SYRP: 5.0000 mL | ORAL_SOLUTION | Freq: Four times a day (QID) | ORAL | 0 refills | Status: DC | PRN

## 2017-02-25 NOTE — Telephone Encounter (Signed)
She has oxycodone Rx so I can't send anything with narcotic. I will send Bromfed-DM for her.

## 2017-02-25 NOTE — Telephone Encounter (Signed)
Pt is requesting a Rx for a cough medication if possible.  [facility].  [place]#[phone]/[id number]

## 2017-02-25 NOTE — Telephone Encounter (Signed)
Please Review.  Thanks,  -Joseline 

## 2017-02-25 NOTE — Telephone Encounter (Signed)
Sure! LM for pt to CB. -MM

## 2017-02-25 NOTE — Telephone Encounter (Signed)
Patient advised.  Thanks,  -Sabryna Lahm 

## 2017-02-27 NOTE — Telephone Encounter (Signed)
Transition Care Management Follow-up Telephone Call    Date discharged? 02/24/17  How have you been since you were released from the hospital? Not much better, still weak and gets worn out easily. Pt states she has not had much activity because of this.   Any patient concerns? None   Items Reviewed:  Medications reviewed: Yes  Allergies reviewed: Yes  Dietary changes reviewed: N/A  Referrals reviewed: N/A   Functional Questionnaire:  Independent - I Dependent - D    Activities of Daily Living (ADLs):    Personal hygiene - I Dressing - I Eating - I Maintaining continence - I Transferring - I   Independent Activities of Daily Living (iADLs): Basic communication skills - I Transportation - I Meal preparation - I Shopping - I Housework - I Managing medications - I  Managing personal finances - I   Confirmed importance and date/time of follow-up visits scheduled YES  Provider Appointment booked with PCP 03/03/17 @ 2:00 PM.  Confirmed with patient if condition begins to worsen call PCP or go to the ER.  Patient was given the office number and encouraged to call back with question or concerns: YES

## 2017-02-28 ENCOUNTER — Emergency Department
Admission: EM | Admit: 2017-02-28 | Discharge: 2017-02-28 | Disposition: A | Discharge status: Home / Self Care | Payer: Medicare Other | Attending: Emergency Medicine | Admitting: Emergency Medicine

## 2017-02-28 ENCOUNTER — Telehealth: Payer: Self-pay

## 2017-02-28 DIAGNOSIS — Z87891 Personal history of nicotine dependence: Secondary | ICD-10-CM | POA: Diagnosis not present

## 2017-02-28 DIAGNOSIS — M545 Low back pain: Secondary | ICD-10-CM | POA: Diagnosis not present

## 2017-02-28 DIAGNOSIS — Z793 Long term (current) use of hormonal contraceptives: Secondary | ICD-10-CM | POA: Diagnosis not present

## 2017-02-28 DIAGNOSIS — J45909 Unspecified asthma, uncomplicated: Secondary | ICD-10-CM | POA: Insufficient documentation

## 2017-02-28 DIAGNOSIS — Z79899 Other long term (current) drug therapy: Secondary | ICD-10-CM | POA: Insufficient documentation

## 2017-02-28 DIAGNOSIS — I10 Essential (primary) hypertension: Secondary | ICD-10-CM

## 2017-02-28 LAB — CBC
HCT: 44.7 % (ref 35.0–47.0)
Hemoglobin: 15.6 g/dL (ref 12.0–16.0)
MCH: 30.7 pg (ref 26.0–34.0)
MCHC: 34.9 g/dL (ref 32.0–36.0)
MCV: 87.9 fL (ref 80.0–100.0)
PLATELETS: 215 10*3/uL (ref 150–440)
RBC: 5.09 MIL/uL (ref 3.80–5.20)
RDW: 14.2 % (ref 11.5–14.5)
WBC: 16.2 10*3/uL — AB (ref 3.6–11.0)

## 2017-02-28 LAB — BASIC METABOLIC PANEL
Anion gap: 11 (ref 5–15)
BUN: 12 mg/dL (ref 6–20)
CALCIUM: 9.1 mg/dL (ref 8.9–10.3)
CO2: 25 mmol/L (ref 22–32)
CREATININE: 0.96 mg/dL (ref 0.44–1.00)
Chloride: 104 mmol/L (ref 101–111)
GFR calc non Af Amer: >60 mL/min (ref >60)
Glucose, Bld: 142 mg/dL — ABNORMAL HIGH (ref 65–99)
Potassium: 3.2 mmol/L — ABNORMAL LOW (ref 3.5–5.1)
SODIUM: 140 mmol/L (ref 135–145)

## 2017-02-28 NOTE — ED Provider Notes (Signed)
[facility] Emergency Department Provider Note   ____________________________________________   I have reviewed the triage vital signs and the nursing notes.   HISTORY  Chief Complaint Fever and Hypertension   History limited by: Not Limited   HPI Susan Gilmore is a 51 y.o. female who presents to the emergency department today on the advice of her primary care doctor because of concerns for high blood pressure. The patient states that she took her blood pressure with a home blood pressure cuff today and it read very high. She has ever has since realized that she was taking her blood pressure incorrectly. Additionally the patient states that she had a fever earlier today however that has resolved. The patient was recently hospitalized for COPD exacerbation. She has not felt significant improvement since discharge from the hospital. She did finish her prednisone taper today.    Past Medical History:  Diagnosis Date  . Abnormal Pap smear 04/28/2012   normal pap /positive hpv   . Abnormal stress test   . Allergic rhinitis   . Bipolar disorder ([place])   . Carpal tunnel syndrome   . Chest pain   . Chronic anxiety   . Contraception   . Cough   . HPV (human papilloma virus) anogenital infection   . HTN (hypertension)   . Hyperplastic colon polyp   . Insomnia   . Palpitations   . Pneumonia     Patient Active Problem List   Diagnosis Date Noted  . Acute on chronic respiratory failure with hypoxia ([place]) 02/23/2017  . Encounter for chronic pain management 12/13/2015  . Chronic pain 12/13/2015  . Respiratory failure ([place]) 11/08/2015  . Asthma 06/05/2015  . Rheumatoid arthritis ([place]) 06/05/2015  . 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 Allergic rhinitis 03/15/2015  . ADD (attention deficit disorder) 12/29/2014  . 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 Back ache 12/29/2014  . Bipolar disorder ([place]) 12/29/2014  . Carpal tunnel  syndrome 12/29/2014  . Chest pain 12/29/2014  . Chronic anxiety 12/29/2014  . Elevated blood sugar 12/29/2014  . Elevated rheumatoid factor 12/29/2014  . Acid reflux 12/29/2014  . Infectious human wart virus 12/29/2014  . Benign neoplasm of colon 12/29/2014  . Cephalalgia 12/29/2014  . Cannot sleep 12/29/2014  . Awareness of heartbeats 12/29/2014  . Flutter-fibrillation 12/29/2014  . LAD (lymphadenopathy), retroperitoneal 12/29/2014  . Fast heart beat 12/29/2014  . Systolic dysfunction 16/96/7893  . Avitaminosis D 12/29/2014  . Paroxysmal digital cyanosis 02/17/2014  . Ventricular bigeminy 01/14/2012  . Hypertension 01/14/2012  . Abnormal EKG 01/14/2012  . Tobacco abuse 01/14/2012  . Benign essential HTN 12/09/2003    Past Surgical History:  Procedure Laterality Date  . full mouth dental    . GANGLION CYST EXCISION    . WISDOM TOOTH EXTRACTION      Prior to Admission medications   Medication Sig Start Date End Date Taking? Authorizing Provider  albuterol (PROAIR HFA) 108 (90 Base) MCG/ACT inhaler Inhale 1-2 puffs into the lungs every 6 (six) hours as needed for wheezing or shortness of breath. 01/01/16   [name], PA-C  ALPRAZolam [name]) 1 MG tablet Take 1 mg by mouth 4 (four) times daily as needed for anxiety.     [provider]  amphetamine-dextroamphetamine (ADDERALL XR) 30 MG 24 hr capsule Take 60 mg by mouth daily. 05/05/15   [provider]  brompheniramine-pseudoephedrine-DM 30-2-10 MG/5ML syrup Take 5 mLs by mouth 4 (four) times daily as needed. 02/25/17  Myanna, Ziesmer M, PA-C  doxepin (SINEQUAN) 25 MG capsule Take 50 mg by mouth at bedtime as needed (sleep).     [provider]  DULoxetine (CYMBALTA) 60 MG capsule Take 120 mg by mouth daily.     [provider]  fluticasone (FLONASE) 50 MCG/ACT nasal spray Place 2 sprays into both nostrils daily. Patient taking differently: Place 2 sprays into both nostrils 2 (two)  times daily.  03/15/15   [name], PA-C  Fluticasone-Salmeterol (ADVAIR) 250-50 MCG/DOSE AEPB Inhale 1 puff into the lungs 2 (two) times daily.    [provider]  Investigational - Study Medication Inject 1 Dose into the muscle every 14 (fourteen) days. Study name: Credo Additional study details: study medication for RA    [provider]  ipratropium-albuterol (DUONEB) 0.5-2.5 (3) MG/3ML SOLN Take 3 mLs by nebulization every 6 (six) hours as needed (shortness of breath). 02/24/17   Henreitta Leber, MD  loratadine (CLARITIN) 10 MG tablet Take 1 tablet by mouth daily. 12/19/14   [provider]  medroxyPROGESTERone (DEPO-PROVERA) 150 MG/ML injection INJECT 1 ML (150 MG TOTAL) INTO THE MUSCLE EVERY 3 (THREE) MONTHS. 02/24/17   [name], PA-C  montelukast (SINGULAIR) 10 MG tablet Take 1 tablet by mouth daily. 12/19/14   [provider]  omeprazole (PRILOSEC) 20 MG capsule Take 1 capsule (20 mg total) by mouth daily. Patient taking differently: Take 20 mg by mouth 2 (two) times daily before a meal.  03/21/15   Burnette, Clearnce Sorrel, PA-C  Oxycodone HCl 10 MG TABS Take 1 tablet (10 mg total) by mouth every 6 (six) hours as needed. For severe pain 02/13/17   [name], PA-C  potassium chloride 20 MEQ TBCR Take 20 mEq by mouth 2 (two) times daily. 02/24/17 02/28/17  Henreitta Leber, MD  predniSONE (DELTASONE) 10 MG tablet Label  & dispense according to the schedule below. 5 Pills PO for 1 day then, 4 Pills PO for 1 day, 3 Pills PO for 1 day, 2 Pills PO for 1 day, 1 Pill PO for 1 days then STOP. 02/24/17   Sainani, Belia Heman, MD  propranolol ER (INDERAL LA) 160 MG SR capsule Take 1 capsule (160 mg total) by mouth daily. Reported on 12/13/2015 02/22/16   [name], PA-C    Allergies Effexor [venlafaxine hcl] and Isosorbide nitrate  Family History  Problem Relation Age of Onset  . Stroke Paternal Grandmother   . Heart disease Paternal  Grandmother   . Diabetes Maternal Grandmother   . Heart disease Maternal Grandmother   . Hypertension Maternal Grandmother   . Heart disease Maternal Grandfather   . Hypertension Maternal Grandfather   . Heart disease Father   . Stroke Father   . Hypertension Father   . Hyperlipidemia Father   . Diabetes Mother   . Heart attack Mother     Social History Social History  Substance Use Topics  . Smoking status: Former Smoker    Packs/day: 1.00    Years: 26.00    Types: Cigarettes    Quit date: 07/26/2013  . Smokeless tobacco: Never Used  . Alcohol use No     Comment: Had a history of alcohol use but currently is not drinking any    Review of Systems Constitutional: Positive for fever. Eyes: No visual changes. ENT: No sore throat. Cardiovascular: Denies chest pain. Respiratory:Positive for dry cough. Gastrointestinal: No abdominal pain.  No nausea, no vomiting.  No diarrhea.  Genitourinary: Negative for dysuria. Musculoskeletal: Negative for back pain. Skin: Negative for rash. Neurological: Negative for headaches, focal weakness or numbness.  ____________________________________________   PHYSICAL EXAM:  VITAL SIGNS: ED Triage Vitals  Enc Vitals Group     BP 02/28/17 1438 (!) 179/95     Pulse Rate 02/28/17 1437 93     Resp 02/28/17 1437 18     Temp 02/28/17 1437 98.6 F (37 C)     Temp Source 02/28/17 1437 Oral     SpO2 02/28/17 1437 98 %     Weight 02/28/17 1437 190 lb (86.2 kg)     Height 02/28/17 1437 5' (1.524 m)     Head Circumference --      Peak Flow --      Pain Score 02/28/17 1437 0   Constitutional: Alert and oriented. Well appearing and in no distress. Eyes: Conjunctivae are normal.  ENT   Head: Normocephalic and atraumatic.   Nose: No congestion/rhinnorhea.   Mouth/Throat: Mucous membranes are moist.   Neck: No stridor. Hematological/Lymphatic/Immunilogical: No cervical lymphadenopathy. Cardiovascular: Normal rate, regular  rhythm.  No murmurs, rubs, or gallops.  Respiratory: Normal respiratory effort without tachypnea nor retractions. Breath sounds are clear and equal bilaterally. No wheezes/rales/rhonchi. Occasional dry cough. Gastrointestinal: Soft and non tender. No rebound. No guarding.  Genitourinary: Deferred Musculoskeletal: Normal range of motion in all extremities. No lower extremity edema. Neurologic:  Normal speech and language. No gross focal neurologic deficits are appreciated.  Skin:  Skin is warm, dry and intact. No rash noted. Psychiatric: Mood and affect are normal. Speech and behavior are normal. Patient exhibits appropriate insight and judgment.  ____________________________________________    LABS (pertinent positives/negatives)  Labs Reviewed  BASIC METABOLIC PANEL - Abnormal; Notable for the following:       Result Value   Potassium 3.2 (*)    Glucose, Bld 142 (*)    All other components within normal limits  CBC - Abnormal; Notable for the following:    WBC 16.2 (*)    All other components within normal limits     ____________________________________________   EKG  None  ____________________________________________    RADIOLOGY  None  ____________________________________________   PROCEDURES  Procedures  ____________________________________________   INITIAL IMPRESSION / ASSESSMENT AND PLAN / ED COURSE  Pertinent labs & imaging results that were available during my care of the patient were reviewed by me and considered in my medical decision making (see chart for details).  Patient presented to the emergency department today because of concerns for elevated blood pressure. Patient however thinks she took it incorrectly. She also states she had a fever earlier today. I did offer to perform chest x-ray given recent hospitalization for COPD however patient declined. The patient did finish a prednisone taper today and does have a leukocytosis. It is hard to know  the significance of this given the recent prednisone use. Patient did feel comfortably being discharged home to follow-up with primary care physician. Did discuss return precautions with the patient.   ____________________________________________   FINAL CLINICAL IMPRESSION(S) / ED DIAGNOSES  Final diagnoses:  Hypertension, unspecified type     Note: This dictation was prepared with Dragon dictation. Any transcriptional errors that result from this process are unintentional     Nance Pear, MD 02/28/17 1719

## 2017-02-28 NOTE — Discharge Instructions (Signed)
Please seek medical attention for any high fevers, chest pain, shortness of breath, change in behavior, persistent vomiting, bloody stool or any other new or concerning symptoms.  

## 2017-02-28 NOTE — ED Triage Notes (Signed)
Pt states she left AMA Monday after being admitted for COPD exacerbation, states since she has had fever with just not feeling well and b/p at home was 250's/99-100

## 2017-02-28 NOTE — Telephone Encounter (Signed)
Patient called c/o shortness of breath, fever, and fatigue. She reports that her symptoms have been ongoing since she was seen at the ER on 02/22/17. She reports that she has had a fever up to 102 degrees since last night. She has used her nebulizer treatments, and reports that her symptoms did not improve. She also mentions that she checked her BP and it was up to 220/89. She denies any chest pain, but she describes it as a "tightness".  Advised Grace Bushy of above symptoms, and she recommended that patient be seen today. Due to high fevers and BP, it was recommended that she be seen in the ER for evaluation. Advised patient that she may need urgent imaging to rule out any abnormalities. Patient verbalizes understanding and agrees to plan. Encouraged to F/U after she has been evaluated in the ER.

## 2017-03-03 ENCOUNTER — Encounter: Payer: Self-pay | Admitting: Physician Assistant

## 2017-03-03 ENCOUNTER — Ambulatory Visit (INDEPENDENT_AMBULATORY_CARE_PROVIDER_SITE_OTHER): Payer: Medicare Other | Admitting: Physician Assistant

## 2017-03-03 VITALS — BP 162/90 | HR 98 | Temp 98.0°F | Resp 16 | Ht 60.0 in | Wt 187.0 lb

## 2017-03-03 DIAGNOSIS — Z3042 Encounter for surveillance of injectable contraceptive: Secondary | ICD-10-CM

## 2017-03-03 DIAGNOSIS — R05 Cough: Secondary | ICD-10-CM | POA: Diagnosis not present

## 2017-03-03 DIAGNOSIS — J4551 Severe persistent asthma with (acute) exacerbation: Secondary | ICD-10-CM

## 2017-03-03 DIAGNOSIS — I10 Essential (primary) hypertension: Secondary | ICD-10-CM | POA: Diagnosis not present

## 2017-03-03 DIAGNOSIS — J9621 Acute and chronic respiratory failure with hypoxia: Secondary | ICD-10-CM

## 2017-03-03 DIAGNOSIS — E876 Hypokalemia: Secondary | ICD-10-CM

## 2017-03-03 DIAGNOSIS — R059 Cough, unspecified: Secondary | ICD-10-CM

## 2017-03-03 DIAGNOSIS — Z09 Encounter for follow-up examination after completed treatment for conditions other than malignant neoplasm: Secondary | ICD-10-CM

## 2017-03-03 MED ORDER — IPRATROPIUM-ALBUTEROL 0.5-2.5 (3) MG/3ML IN SOLN: 3.0000 mL | Freq: Four times a day (QID) | RESPIRATORY_TRACT | 5 refills | Status: DC | PRN

## 2017-03-03 MED ORDER — LOSARTAN POTASSIUM 50 MG PO TABS: 50.0000 mg | ORAL_TABLET | Freq: Every day | ORAL | 5 refills | Status: DC

## 2017-03-03 MED ORDER — PSEUDOEPH-BROMPHEN-DM 30-2-10 MG/5ML PO SYRP: 5.0000 mL | ORAL_SOLUTION | Freq: Four times a day (QID) | ORAL | 0 refills | Status: DC | PRN

## 2017-03-03 MED ORDER — MEDROXYPROGESTERONE ACETATE 150 MG/ML IM SUSP
150.0000 mg | Freq: Once | INTRAMUSCULAR | Status: AC
  Administered 2017-03-03: 150 mg via INTRAMUSCULAR

## 2017-03-03 NOTE — Patient Instructions (Signed)

## 2017-03-03 NOTE — Progress Notes (Signed)
Patient: Susan Gilmore Female    DOB: [birth year]   51 y.o.   MRN: [id number] Visit Date: 03/03/2017  Today's Provider: [name], PA-C   Chief Complaint  Patient presents with  . Hospitalization Follow-up   Subjective:    Patient reports that she went to ER on 02/22/17 due to shortness of breath and cough. Patient reports that she was admitted due to elevated blood pressure. Patient reports BP readings are still elevated.     Follow up Hospitalization  Patient was admitted to [facility] on 02/22/17 and discharged on 02/24/17. She was treated for Acute on chronic respiratory failure with hypoxia. Treatment for this included DuoNeb, Pulmicort nebs and presdnisone . Telephone follow up was done on 02/24/17 She reports excellent compliance with treatment. She reports this condition is Improved. ------------------------------------------------------------------------------------  Patient was due for Depo (02/28/2017) for contraception while she was hospitalized.    Allergies  Allergen Reactions  . Effexor [Venlafaxine Hcl] Swelling  . Isosorbide Nitrate Other (See Comments)    Reaction: Bad headache     Current Outpatient Prescriptions:  .  albuterol (PROAIR HFA) 108 (90 Base) MCG/ACT inhaler, Inhale 1-2 puffs into the lungs every 6 (six) hours as needed for wheezing or shortness of breath., Disp: 18 g, Rfl: 11 .  ALPRAZolam (XANAX) 1 MG tablet, Take 1 mg by mouth 4 (four) times daily as needed for anxiety. , Disp: , Rfl:  .  amphetamine-dextroamphetamine (ADDERALL XR) 30 MG 24 hr capsule, Take 60 mg by mouth daily., Disp: , Rfl: 0 .  brompheniramine-pseudoephedrine-DM 30-2-10 MG/5ML syrup, Take 5 mLs by mouth 4 (four) times daily as needed., Disp: 120 mL, Rfl: 0 .  doxepin (SINEQUAN) 25 MG capsule, Take 50 mg by mouth at bedtime as needed (sleep). , Disp: , Rfl:  .  DULoxetine (CYMBALTA) 60 MG capsule, Take 120 mg by mouth daily. , Disp: , Rfl:  .  fluticasone  (FLONASE) 50 MCG/ACT nasal spray, Place 2 sprays into both nostrils daily. (Patient taking differently: Place 2 sprays into both nostrils 2 (two) times daily. ), Disp: 16 g, Rfl: 11 .  Fluticasone-Salmeterol (ADVAIR) 250-50 MCG/DOSE AEPB, Inhale 1 puff into the lungs 2 (two) times daily., Disp: , Rfl:  .  Investigational - Study Medication, Inject 1 Dose into the muscle every 14 (fourteen) days. Study name: Credo Additional study details: study medication for RA, Disp: , Rfl:  .  loratadine (CLARITIN) 10 MG tablet, Take 1 tablet by mouth daily., Disp: , Rfl:  .  medroxyPROGESTERone (DEPO-PROVERA) 150 MG/ML injection, INJECT 1 ML (150 MG TOTAL) INTO THE MUSCLE EVERY 3 (THREE) MONTHS., Disp: 1 mL, Rfl: 3 .  montelukast (SINGULAIR) 10 MG tablet, Take 1 tablet by mouth daily., Disp: , Rfl:  .  omeprazole (PRILOSEC) 20 MG capsule, Take 1 capsule (20 mg total) by mouth daily. (Patient taking differently: Take 20 mg by mouth 2 (two) times daily before a meal. ), Disp: 30 capsule, Rfl: 11 .  Oxycodone HCl 10 MG TABS, Take 1 tablet (10 mg total) by mouth every 6 (six) hours as needed. For severe pain, Disp: 180 tablet, Rfl: 0 .  propranolol ER (INDERAL LA) 160 MG SR capsule, Take 1 capsule (160 mg total) by mouth daily. Reported on 12/13/2015, Disp: 90 capsule, Rfl: 3 .  ipratropium-albuterol (DUONEB) 0.5-2.5 (3) MG/3ML SOLN, Take 3 mLs by nebulization every 6 (six) hours as needed (shortness of breath). (Patient not taking: Reported on 03/03/2017), Disp: 360  mL, Rfl: 0  Review of Systems  Constitutional: Positive for fatigue.  HENT: Positive for congestion and ear discharge.   Respiratory: Positive for cough, chest tightness, shortness of breath and wheezing.   Cardiovascular: Negative.   Gastrointestinal: Negative.   Neurological: Negative.     Social History  Substance Use Topics  . Smoking status: Former Smoker    Packs/day: 1.00    Years: 26.00    Types: Cigarettes    Quit date: 07/26/2013  .  Smokeless tobacco: Never Used  . Alcohol use No     Comment: Had a history of alcohol use but currently is not drinking any   Objective:   BP (!) 162/90 (BP Location: Left Arm, Patient Position: Sitting, Cuff Size: Large)   Pulse 98   Temp 98 F (36.7 C) (Oral)   Resp 16   Ht 5' (1.524 m)   Wt 187 lb (84.8 kg)   SpO2 99%   BMI 36.52 kg/m  Vitals:   03/03/17 1405  BP: (!) 162/90  Pulse: 98  Resp: 16  Temp: 98 F (36.7 C)  TempSrc: Oral  SpO2: 99%  Weight: 187 lb (84.8 kg)  Height: 5' (1.524 m)     Physical Exam  Constitutional: She appears well-developed and well-nourished. No distress.  Neck: Normal range of motion. Neck supple. No JVD present. No tracheal deviation present. No thyromegaly present.  Cardiovascular: Normal rate, regular rhythm and normal heart sounds.  Exam reveals no gallop and no friction rub.   No murmur heard. Pulmonary/Chest: Effort normal and breath sounds normal. No respiratory distress. She has no wheezes. She has no rales.  Musculoskeletal: She exhibits no edema.  Lymphadenopathy:    She has no cervical adenopathy.  Skin: She is not diaphoretic.  Vitals reviewed.       Assessment & Plan:     1. Hospital discharge follow-up Hospitalization record from 02/22/17 and 02/28/17 were reviewed. Transition call reviewed from 02/24/17. All labs and imaging notes reviewed. Patient reports she is approx. 70% better. Still has some residual cough. She has been compliant with her medications.   2. Encounter for surveillance of injectable contraceptive Depoprovera injection given today. She is to return on 05/20/17 for next injection.  - medroxyPROGESTERone (DEPO-PROVERA) injection 150 mg; Inject 1 mL (150 mg total) into the muscle once.  3. Acute on chronic respiratory failure with hypoxia (HCC) Improving. Sats back to baseline. Duoneb refilled as below. Will recheck CBC for leukocytosis noted at most recent hospitalization.  - CBC w/Diff/Platelet -  ipratropium-albuterol (DUONEB) 0.5-2.5 (3) MG/3ML SOLN; Take 3 mLs by nebulization every 6 (six) hours as needed (shortness of breath).  Dispense: 360 mL; Refill: 5  4. Essential hypertension Uncontrolled. Continue propanolol. Will add losartan as below. I will see her back in 4 weeks to recheck her BP.  - CBC w/Diff/Platelet - losartan (COZAAR) 50 MG tablet; Take 1 tablet (50 mg total) by mouth daily.  Dispense: 30 tablet; Refill: 5  5. Severe persistent asthma with acute exacerbation See above medical treatment plan. - CBC w/Diff/Platelet - ipratropium-albuterol (DUONEB) 0.5-2.5 (3) MG/3ML SOLN; Take 3 mLs by nebulization every 6 (six) hours as needed (shortness of breath).  Dispense: 360 mL; Refill: 5  6. Hypokalemia Will recheck labs as below. Patient is no longer taking potassium supplement given at the hospital. I will f/u pending labs. - Basic Metabolic Panel (BMET)  7. Cough Improving.  Diagnosis pulled for medication refill. Continue current medical treatment plan. - brompheniramine-pseudoephedrine-DM 30-2-10  MG/5ML syrup; Take 5 mLs by mouth 4 (four) times daily as needed.  Dispense: 120 mL; Refill: 0       [name], PA-C  [facility]

## 2017-03-31 ENCOUNTER — Ambulatory Visit: Payer: Self-pay | Admitting: Physician Assistant

## 2017-04-07 ENCOUNTER — Other Ambulatory Visit: Payer: Self-pay | Admitting: Physician Assistant

## 2017-04-07 DIAGNOSIS — G8929 Other chronic pain: Secondary | ICD-10-CM

## 2017-04-07 DIAGNOSIS — M069 Rheumatoid arthritis, unspecified: Secondary | ICD-10-CM

## 2017-04-07 MED ORDER — OXYCODONE HCL 10 MG PO TABS: 10.0000 mg | ORAL_TABLET | Freq: Four times a day (QID) | ORAL | 0 refills | Status: DC | PRN

## 2017-04-07 NOTE — Telephone Encounter (Signed)
LM that prescription is ready for pick up.  Thanks,  -Byran Bilotti

## 2017-04-07 NOTE — Telephone Encounter (Signed)
Pt needs refill on her pain med  Oxycodone 10 mg  Thanks teri

## 2017-04-07 NOTE — Telephone Encounter (Signed)
Rx printed. [place] reviewed per guidelines and no red flags noted.

## 2017-04-18 ENCOUNTER — Telehealth: Payer: Self-pay

## 2017-04-18 NOTE — Telephone Encounter (Signed)
Patient called asking if we have samples of Advair and can she get another RX or cough medication. She was coughing a lot on the phone too.-aa

## 2017-04-18 NOTE — Telephone Encounter (Signed)
We have one sample of advair for her . Also only cough suppressant she can use is OTC such as robitussin or delsym since she is already on oxycodone.

## 2017-04-18 NOTE — Telephone Encounter (Signed)
Patient advised as directed below.  Thanks,  -Malva Diesing 

## 2017-05-19 ENCOUNTER — Telehealth: Payer: Self-pay | Admitting: Physician Assistant

## 2017-05-19 DIAGNOSIS — Z3042 Encounter for surveillance of injectable contraceptive: Secondary | ICD-10-CM

## 2017-05-19 MED ORDER — MEDROXYPROGESTERONE ACETATE 150 MG/ML IM SUSP: 150.0000 mg | INTRAMUSCULAR | 3 refills | Status: DC

## 2017-05-19 NOTE — Telephone Encounter (Signed)
Pt contacted office for refill request on the following medications:  medroxyPROGESTERone (DEPO-PROVERA) 150 MG/ML injection  [facility].  DT#[phone]

## 2017-05-19 NOTE — Telephone Encounter (Signed)
Depo provera sent in

## 2017-05-20 ENCOUNTER — Ambulatory Visit: Payer: Self-pay | Admitting: Physician Assistant

## 2017-05-22 ENCOUNTER — Telehealth: Payer: Self-pay | Admitting: Physician Assistant

## 2017-05-22 ENCOUNTER — Ambulatory Visit (INDEPENDENT_AMBULATORY_CARE_PROVIDER_SITE_OTHER): Payer: Medicare Other | Admitting: Physician Assistant

## 2017-05-22 DIAGNOSIS — Z1231 Encounter for screening mammogram for malignant neoplasm of breast: Secondary | ICD-10-CM

## 2017-05-22 DIAGNOSIS — M069 Rheumatoid arthritis, unspecified: Secondary | ICD-10-CM | POA: Diagnosis not present

## 2017-05-22 DIAGNOSIS — G8929 Other chronic pain: Secondary | ICD-10-CM

## 2017-05-22 DIAGNOSIS — Z3042 Encounter for surveillance of injectable contraceptive: Secondary | ICD-10-CM

## 2017-05-22 DIAGNOSIS — Z1239 Encounter for other screening for malignant neoplasm of breast: Secondary | ICD-10-CM

## 2017-05-22 MED ORDER — OXYCODONE HCL 10 MG PO TABS: 10.0000 mg | ORAL_TABLET | Freq: Four times a day (QID) | ORAL | 0 refills | Status: DC | PRN

## 2017-05-22 MED ORDER — MEDROXYPROGESTERONE ACETATE 150 MG/ML IM SUSP
150.0000 mg | Freq: Once | INTRAMUSCULAR | Status: AC
  Administered 2017-05-22: 150 mg via INTRAMUSCULAR

## 2017-05-22 NOTE — Telephone Encounter (Signed)
Please review

## 2017-05-22 NOTE — Telephone Encounter (Signed)
Pt called saying that the medication she is taking for is credo.  FYI   TEri

## 2017-05-22 NOTE — Progress Notes (Signed)
Patient: Susan Gilmore Female    DOB: [birth year]   51 y.o.   MRN: [id number] Visit Date: 05/22/2017  Today's Provider: [name], PA-C   Chief Complaint  Patient presents with  . Contraception   Subjective:    HPI Patient here today for depo-provera injection. Patient reports feeling well, denies any URI symptoms.  Patient also request influenza vaccine, and refill on her oxycodone.     Allergies  Allergen Reactions  . Effexor [Venlafaxine Hcl] Swelling  . Isosorbide Nitrate Other (See Comments)    Reaction: Bad headache     Current Outpatient Prescriptions:  .  albuterol (PROAIR HFA) 108 (90 Base) MCG/ACT inhaler, Inhale 1-2 puffs into the lungs every 6 (six) hours as needed for wheezing or shortness of breath., Disp: 18 g, Rfl: 11 .  ALPRAZolam (XANAX) 1 MG tablet, Take 1 mg by mouth 4 (four) times daily as needed for anxiety. , Disp: , Rfl:  .  amphetamine-dextroamphetamine (ADDERALL XR) 30 MG 24 hr capsule, Take 60 mg by mouth daily., Disp: , Rfl: 0 .  brompheniramine-pseudoephedrine-DM 30-2-10 MG/5ML syrup, Take 5 mLs by mouth 4 (four) times daily as needed., Disp: 120 mL, Rfl: 0 .  doxepin (SINEQUAN) 25 MG capsule, Take 50 mg by mouth at bedtime as needed (sleep). , Disp: , Rfl:  .  DULoxetine (CYMBALTA) 60 MG capsule, Take 120 mg by mouth daily. , Disp: , Rfl:  .  fluticasone (FLONASE) 50 MCG/ACT nasal spray, Place 2 sprays into both nostrils daily. (Patient taking differently: Place 2 sprays into both nostrils 2 (two) times daily. ), Disp: 16 g, Rfl: 11 .  Fluticasone-Salmeterol (ADVAIR) 250-50 MCG/DOSE AEPB, Inhale 1 puff into the lungs 2 (two) times daily., Disp: , Rfl:  .  Investigational - Study Medication, Inject 1 Dose into the muscle every 14 (fourteen) days. Study name: Credo Additional study details: study medication for RA, Disp: , Rfl:  .  ipratropium-albuterol (DUONEB) 0.5-2.5 (3) MG/3ML SOLN, Take 3 mLs by nebulization every 6 (six) hours as  needed (shortness of breath)., Disp: 360 mL, Rfl: 5 .  loratadine (CLARITIN) 10 MG tablet, Take 1 tablet by mouth daily., Disp: , Rfl:  .  losartan (COZAAR) 50 MG tablet, Take 1 tablet (50 mg total) by mouth daily., Disp: 30 tablet, Rfl: 5 .  medroxyPROGESTERone (DEPO-PROVERA) 150 MG/ML injection, Inject 1 mL (150 mg total) into the muscle every 3 (three) months., Disp: 1 mL, Rfl: 3 .  montelukast (SINGULAIR) 10 MG tablet, Take 1 tablet by mouth daily., Disp: , Rfl:  .  omeprazole (PRILOSEC) 20 MG capsule, Take 1 capsule (20 mg total) by mouth daily. (Patient taking differently: Take 20 mg by mouth 2 (two) times daily before a meal. ), Disp: 30 capsule, Rfl: 11 .  Oxycodone HCl 10 MG TABS, Take 1 tablet (10 mg total) by mouth every 6 (six) hours as needed. For severe pain, Disp: 180 tablet, Rfl: 0 .  propranolol ER (INDERAL LA) 160 MG SR capsule, Take 1 capsule (160 mg total) by mouth daily. Reported on 12/13/2015, Disp: 90 capsule, Rfl: 3  Review of Systems  Social History  Substance Use Topics  . Smoking status: Former Smoker    Packs/day: 1.00    Years: 26.00    Types: Cigarettes    Quit date: 07/26/2013  . Smokeless tobacco: Never Used  . Alcohol use No     Comment: Had a history of alcohol use but currently is  not drinking any   Objective:   There were no vitals taken for this visit. There were no vitals filed for this visit.   Physical Exam      Assessment & Plan:     1. Encounter for surveillance of injectable contraceptive Depo-provera injection given to patient without issue.   2. Rheumatoid arthritis involving multiple sites, unspecified rheumatoid factor presence (HCC) Stable. Diagnosis pulled for medication refill. Continue current medical treatment plan. - Oxycodone HCl 10 MG TABS; Take 1 tablet (10 mg total) by mouth every 6 (six) hours as needed. For severe pain  Dispense: 180 tablet; Refill: 0  3. Breast cancer screening - MM DIGITAL SCREENING BILATERAL;  Future  4. Encounter for chronic pain management - Oxycodone HCl 10 MG TABS; Take 1 tablet (10 mg total) by mouth every 6 (six) hours as needed. For severe pain  Dispense: 180 tablet; Refill: 0       [name], PA-C  [facility]

## 2017-05-28 ENCOUNTER — Emergency Department: Payer: Medicare Other

## 2017-05-28 ENCOUNTER — Emergency Department
Admission: EM | Admit: 2017-05-28 | Discharge: 2017-05-28 | Disposition: A | Discharge status: Home / Self Care | Payer: Medicare Other | Attending: Emergency Medicine | Admitting: Emergency Medicine

## 2017-05-28 ENCOUNTER — Encounter: Payer: Self-pay | Admitting: Medical Oncology

## 2017-05-28 DIAGNOSIS — Y999 Unspecified external cause status: Secondary | ICD-10-CM | POA: Diagnosis not present

## 2017-05-28 DIAGNOSIS — Y939 Activity, unspecified: Secondary | ICD-10-CM | POA: Insufficient documentation

## 2017-05-28 DIAGNOSIS — S99922A Unspecified injury of left foot, initial encounter: Secondary | ICD-10-CM | POA: Diagnosis present

## 2017-05-28 DIAGNOSIS — I110 Hypertensive heart disease with heart failure: Secondary | ICD-10-CM | POA: Insufficient documentation

## 2017-05-28 DIAGNOSIS — M069 Rheumatoid arthritis, unspecified: Secondary | ICD-10-CM | POA: Diagnosis not present

## 2017-05-28 DIAGNOSIS — Y929 Unspecified place or not applicable: Secondary | ICD-10-CM | POA: Diagnosis not present

## 2017-05-28 DIAGNOSIS — J45909 Unspecified asthma, uncomplicated: Secondary | ICD-10-CM | POA: Diagnosis not present

## 2017-05-28 DIAGNOSIS — Z79899 Other long term (current) drug therapy: Secondary | ICD-10-CM | POA: Insufficient documentation

## 2017-05-28 DIAGNOSIS — F17210 Nicotine dependence, cigarettes, uncomplicated: Secondary | ICD-10-CM | POA: Insufficient documentation

## 2017-05-28 DIAGNOSIS — X501XXA Overexertion from prolonged static or awkward postures, initial encounter: Secondary | ICD-10-CM | POA: Insufficient documentation

## 2017-05-28 DIAGNOSIS — S96912A Strain of unspecified muscle and tendon at ankle and foot level, left foot, initial encounter: Secondary | ICD-10-CM | POA: Insufficient documentation

## 2017-05-28 DIAGNOSIS — I5042 Chronic combined systolic (congestive) and diastolic (congestive) heart failure: Secondary | ICD-10-CM | POA: Insufficient documentation

## 2017-05-28 NOTE — ED Triage Notes (Signed)
Pt twisted left foot yesterday c/o pain to top of foot.

## 2017-05-28 NOTE — ED Notes (Signed)
See triage note states she missed last step and twisted left foot last pm  Stats she was able to bear wt last pm but increased pain swelling to foot today

## 2017-05-29 NOTE — ED Provider Notes (Signed)
[facility] Emergency Department Provider Note  ____________________________________________  Time seen: Approximately 7:17 AM  I have reviewed the triage vital signs and the nursing notes.   HISTORY  Chief Complaint Foot Pain    HPI Susan Gilmore is a 51 y.o. female presents to emergency department for evaluation of foot pain after twisting foot while going up steps yesterday. Foot is painful to walk on. Top of her foot has bruising so she decided to come to the emergency department to be evaluated. She did not fall or hit her head.No additional injuries. No numbness, tingling.   Past Medical History:  Diagnosis Date  . Abnormal Pap smear 04/28/2012   normal pap /positive hpv   . Abnormal stress test   . Allergic rhinitis   . Bipolar disorder ([place])   . Carpal tunnel syndrome   . Chest pain   . Chronic anxiety   . Contraception   . Cough   . HPV (human papilloma virus) anogenital infection   . HTN (hypertension)   . Hyperplastic colon polyp   . Insomnia   . Palpitations   . Pneumonia     Patient Active Problem List   Diagnosis Date Noted  . Acute on chronic respiratory failure with hypoxia ([place]) 02/23/2017  . Encounter for chronic pain management 12/13/2015  . Chronic pain 12/13/2015  . Asthma 06/05/2015  . Rheumatoid arthritis ([place]) 06/05/2015  . 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 Allergic rhinitis 03/15/2015  . ADD (attention deficit disorder) 12/29/2014  . 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 Back ache 12/29/2014  . Bipolar disorder ([place]) 12/29/2014  . Carpal tunnel syndrome 12/29/2014  . Chest pain 12/29/2014  . Chronic anxiety 12/29/2014  . Elevated blood sugar 12/29/2014  . Elevated rheumatoid factor 12/29/2014  . Acid reflux 12/29/2014  . Infectious human wart virus 12/29/2014  . Benign neoplasm of colon 12/29/2014  . Cephalalgia 12/29/2014  . Cannot sleep 12/29/2014  . Awareness of heartbeats  12/29/2014  . Flutter-fibrillation 12/29/2014  . LAD (lymphadenopathy), retroperitoneal 12/29/2014  . Fast heart beat 12/29/2014  . Systolic dysfunction 86/76[birth year]1950  . Avitaminosis D 12/29/2014  . Paroxysmal digital cyanosis 02/17/2014  . Ventricular bigeminy 01/14/2012  . Hypertension 01/14/2012  . Abnormal EKG 01/14/2012  . Tobacco abuse 01/14/2012    Past Surgical History:  Procedure Laterality Date  . full mouth dental    . GANGLION CYST EXCISION    . WISDOM TOOTH EXTRACTION      Prior to Admission medications   Medication Sig Start Date End Date Taking? Authorizing Provider  albuterol (PROAIR HFA) 108 (90 Base) MCG/ACT inhaler Inhale 1-2 puffs into the lungs every 6 (six) hours as needed for wheezing or shortness of breath. 01/01/16   [name], PA-C  ALPRAZolam [name]) 1 MG tablet Take 1 mg by mouth 4 (four) times daily as needed for anxiety.     [provider]  amphetamine-dextroamphetamine (ADDERALL XR) 30 MG 24 hr capsule Take 60 mg by mouth daily. 05/05/15   [provider]  brompheniramine-pseudoephedrine-DM 30-2-10 MG/5ML syrup Take 5 mLs by mouth 4 (four) times daily as needed. 03/03/17   [name], PA-C  doxepin (SINEQUAN) 25 MG capsule Take 50 mg by mouth at bedtime as needed (sleep).     [provider]  DULoxetine (CYMBALTA) 60 MG capsule Take 120 mg by mouth daily.     [provider]  fluticasone (FLONASE) 50 MCG/ACT nasal spray Place 2 sprays into both  nostrils daily. Patient taking differently: Place 2 sprays into both nostrils 2 (two) times daily.  03/15/15   [name], PA-C  Fluticasone-Salmeterol (ADVAIR) 250-50 MCG/DOSE AEPB Inhale 1 puff into the lungs 2 (two) times daily.    [provider]  Investigational - Study Medication Inject 1 Dose into the muscle every 14 (fourteen) days. Study name: Credo Additional study details: study medication for RA    [provider]   ipratropium-albuterol (DUONEB) 0.5-2.5 (3) MG/3ML SOLN Take 3 mLs by nebulization every 6 (six) hours as needed (shortness of breath). 03/03/17   [name], PA-C  loratadine (CLARITIN) 10 MG tablet Take 1 tablet by mouth daily. 12/19/14   [provider]  losartan (COZAAR) 50 MG tablet Take 1 tablet (50 mg total) by mouth daily. 03/03/17   [name], PA-C  medroxyPROGESTERone (DEPO-PROVERA) 150 MG/ML injection Inject 1 mL (150 mg total) into the muscle every 3 (three) months. 05/19/17   [name], PA-C  montelukast (SINGULAIR) 10 MG tablet Take 1 tablet by mouth daily. 12/19/14   [provider]  omeprazole (PRILOSEC) 20 MG capsule Take 1 capsule (20 mg total) by mouth daily. Patient taking differently: Take 20 mg by mouth 2 (two) times daily before a meal.  03/21/15   [place]ette, Clearnce Sorrel, PA-C  Oxycodone HCl 10 MG TABS Take 1 tablet (10 mg total) by mouth every 6 (six) hours as needed. For severe pain 05/22/17   [name], PA-C  propranolol ER (INDERAL LA) 160 MG SR capsule Take 1 capsule (160 mg total) by mouth daily. Reported on 12/13/2015 02/22/16   [name], PA-C    Allergies Effexor [venlafaxine hcl] and Isosorbide nitrate  Family History  Problem Relation Age of Onset  . Stroke Paternal Grandmother   . Heart disease Paternal Grandmother   . Diabetes Maternal Grandmother   . Heart disease Maternal Grandmother   . Hypertension Maternal Grandmother   . Heart disease Maternal Grandfather   . Hypertension Maternal Grandfather   . Heart disease Father   . Stroke Father   . Hypertension Father   . Hyperlipidemia Father   . Diabetes Mother   . Heart attack Mother     Social History Social History  Substance Use Topics  . Smoking status: Former Smoker    Packs/day: 1.00    Years: 26.00    Types: Cigarettes    Quit date: 07/26/2013  . Smokeless tobacco: Never Used  . Alcohol use No     Comment: Had a history of  alcohol use but currently is not drinking any     Review of Systems  Constitutional: No fever/chills Cardiovascular: No chest pain. Respiratory: No SOB. Gastrointestinal: No abdominal pain.  No nausea, no vomiting.  Musculoskeletal: Positive for foot pain. Skin: Negative for abrasions, lacerations. Positive for ecchymosis. Neurological: Negative for headaches, numbness or tingling   ____________________________________________   PHYSICAL EXAM:  VITAL SIGNS: ED Triage Vitals  Enc Vitals Group     BP 05/28/17 1438 (!) 180/75     Pulse Rate 05/28/17 1436 98     Resp 05/28/17 1436 18     Temp 05/28/17 1436 97.9 F (36.6 C)     Temp Source 05/28/17 1436 Oral     SpO2 05/28/17 1436 97 %     Weight 05/28/17 1437 188 lb (85.3 kg)     Height 05/28/17 1437 5\' 1"  (1.549 m)     Head Circumference --  Peak Flow --      Pain Score 05/28/17 1436 9     Pain Loc --      Pain Edu? --      Excl. in [place]? --      Constitutional: Alert and oriented. Well appearing and in no acute distress. Eyes: Conjunctivae are normal. PERRL. EOMI. Head: Atraumatic. ENT:      Ears:      Nose: No congestion/rhinnorhea.      Mouth/Throat: Mucous membranes are moist.  Neck: No stridor. Cardiovascular: Normal rate, regular rhythm.  Good peripheral circulation. Palpable dorsalis pedis pulses. Respiratory: Normal respiratory effort without tachypnea or retractions. Lungs CTAB. Good air entry to the bases with no decreased or absent breath sounds. Musculoskeletal: Full range of motion to all extremities. No gross deformities appreciated. Mild swelling with tenderness to palpation over dorsal side of left foot. Full range of motion and strength of toes. Compartments are soft. Neurologic:  Normal speech and language. No gross focal neurologic deficits are appreciated.  Skin:  Skin is warm, dry and intact. Bruising to the dorsal side of mid left foot.   ____________________________________________    LABS (all labs ordered are listed, but only abnormal results are displayed)  Labs Reviewed - No data to display ____________________________________________  EKG   ____________________________________________  RADIOLOGY Robinette Haines, personally viewed and evaluated these images (plain radiographs) as part of my medical decision making, as well as reviewing the written report by the radiologist.  Dg Foot Complete Left  Result Date: 05/28/2017 CLINICAL DATA:  Twisted left foot yesterday with top of foot pain. Initial encounter. EXAM: LEFT FOOT - COMPLETE 3+ VIEW COMPARISON:  None. FINDINGS: There is no evidence of fracture or dislocation. There is no evidence of arthropathy or other focal bone abnormality. Soft tissues are unremarkable. IMPRESSION: Negative. Electronically Signed   By: Monte Fantasia M.D.   On: 05/28/2017 15:07    ____________________________________________    PROCEDURES  Procedure(s) performed:    Procedures    Medications - No data to display   ____________________________________________   INITIAL IMPRESSION / ASSESSMENT AND PLAN / ED COURSE  Pertinent labs & imaging results that were available during my care of the patient were reviewed by me and considered in my medical decision making (see chart for details).  Review of the [place] CSRS was performed in accordance of the [place] prior to dispensing any controlled drugs.   Patient's diagnosis is consistent with foot sprain. Vital signs and exam are reassuring. I sent negative for acute bony abnormalities. That was Ace wrap. Crutches were given. Education about RICE was provided. Patient is to follow up with PCP as directed. Patient is given ED precautions to return to the ED for any worsening or new symptoms.     ____________________________________________  FINAL CLINICAL IMPRESSION(S) / ED DIAGNOSES  Final diagnoses:  Muscle strain of left foot, initial encounter      NEW MEDICATIONS  STARTED DURING THIS VISIT:  Discharge Medication List as of 05/28/2017  3:44 PM          This chart was dictated using voice recognition software/Dragon. Despite best efforts to proofread, errors can occur which can change the meaning. Any change was purely unintentional.    Laban Emperor, PA-C 05/29/17 0720    Clearnce Hasten Randall An, MD 05/29/17 [phone]

## 2017-06-12 ENCOUNTER — Ambulatory Visit
Admission: RE | Admit: 2017-06-12 | Discharge: 2017-06-12 | Disposition: A | Discharge status: Home / Self Care | Payer: Medicare Other | Source: Ambulatory Visit | Attending: Internal Medicine | Admitting: Internal Medicine

## 2017-06-12 ENCOUNTER — Ambulatory Visit (INDEPENDENT_AMBULATORY_CARE_PROVIDER_SITE_OTHER): Payer: Medicare Other | Admitting: Internal Medicine

## 2017-06-12 ENCOUNTER — Telehealth: Payer: Self-pay

## 2017-06-12 DIAGNOSIS — R7611 Nonspecific reaction to tuberculin skin test without active tuberculosis: Secondary | ICD-10-CM

## 2017-06-12 DIAGNOSIS — Z227 Latent tuberculosis: Secondary | ICD-10-CM

## 2017-06-12 LAB — CBC
HCT: 41.6 % (ref 35.0–45.0)
Hemoglobin: 14.3 g/dL (ref 11.7–15.5)
MCH: 30.2 pg (ref 27.0–33.0)
MCHC: 34.4 g/dL (ref 32.0–36.0)
MCV: 87.9 fL (ref 80.0–100.0)
MPV: 12.5 fL (ref 7.5–12.5)
PLATELETS: 198 10*3/uL (ref 140–400)
RBC: 4.73 10*6/uL (ref 3.80–5.10)
RDW: 12.8 % (ref 11.0–15.0)
WBC: 7.6 10*3/uL (ref 3.8–10.8)

## 2017-06-12 LAB — COMPREHENSIVE METABOLIC PANEL
AG Ratio: 2 (calc) (ref 1.0–2.5)
ALT: 27 U/L (ref 6–29)
AST: 18 U/L (ref 10–35)
Albumin: 4.4 g/dL (ref 3.6–5.1)
Alkaline phosphatase (APISO): 72 U/L (ref 33–115)
BUN: 15 mg/dL (ref 7–25)
CO2: 25 mmol/L (ref 20–32)
CREATININE: 0.94 mg/dL (ref 0.50–1.10)
Calcium: 9.3 mg/dL (ref 8.6–10.2)
Chloride: 106 mmol/L (ref 98–110)
GLUCOSE: 87 mg/dL (ref 65–99)
Globulin: 2.2 g/dL (calc) (ref 1.9–3.7)
Potassium: 4.1 mmol/L (ref 3.5–5.3)
Sodium: 139 mmol/L (ref 135–146)
Total Bilirubin: 1.2 mg/dL (ref 0.2–1.2)
Total Protein: 6.6 g/dL (ref 6.1–8.1)

## 2017-06-12 NOTE — Telephone Encounter (Signed)
Faxed signed release to medication management Pola Corn, LPN

## 2017-06-13 ENCOUNTER — Telehealth: Payer: Self-pay | Admitting: *Deleted

## 2017-06-13 NOTE — Progress Notes (Addendum)
[facility]  Reason for Consult: Positive QuantiFERON gold TB assay Referring Physician: Lillia Abed PA  Assessment: Susan Gilmore now has a positive QuantiFERON gold TB assay. She wonders if this could be a false positive. I told her that there is no reliable and accurate way to distinguish a true positive assay from a false positive assay. Studies have revealed that there is a significantly higher rate of conversion from negative to positive on these assays than with serial skin testing suggesting that accuracy and reliability of the assays may be questionable. She has no evidence of active tuberculosis at this time. We will see her back next week to discuss options. I will strongly consider repeat testing with a quantitative assay to see if it is still positive and to what degree. If it is positive then she will probably be a candidate for treatment for presumed latent tuberculosis. I would probably want to avoid any of our rifampin based treatment regimens given the concern for drug drug interactions with her Depo-Provera, Prilosec and Xanax.  Plan: 1. Follow-up next week to consider repeat quantitative testing   Patient Active Problem List   Diagnosis Date Noted  . Latent tuberculosis by blood test 06/12/2017    Priority: High  . Acute on chronic respiratory failure with hypoxia ([place]) 02/23/2017  . Encounter for chronic pain management 12/13/2015  . Chronic pain 12/13/2015  . Asthma 06/05/2015  . Rheumatoid arthritis ([place]) 06/05/2015  . 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 Allergic rhinitis 03/15/2015  . ADD (attention deficit disorder) 12/29/2014  . 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 Back ache 12/29/2014  . Bipolar disorder ([place]) 12/29/2014  . Carpal tunnel syndrome 12/29/2014  . Chest pain 12/29/2014  . Chronic anxiety 12/29/2014  . Elevated blood sugar 12/29/2014  . Elevated rheumatoid factor 12/29/2014  .  Acid reflux 12/29/2014  . Infectious human wart virus 12/29/2014  . Benign neoplasm of colon 12/29/2014  . Cephalalgia 12/29/2014  . Cannot sleep 12/29/2014  . Awareness of heartbeats 12/29/2014  . Flutter-fibrillation 12/29/2014  . LAD (lymphadenopathy), retroperitoneal 12/29/2014  . Fast heart beat 12/29/2014  . Systolic dysfunction 87/86/7672  . Avitaminosis D 12/29/2014  . Paroxysmal digital cyanosis 02/17/2014  . Ventricular bigeminy 01/14/2012  . Hypertension 01/14/2012  . Abnormal EKG 01/14/2012  . Tobacco abuse 01/14/2012    Patient's Medications  New Prescriptions   No medications on file  Previous Medications   ALBUTEROL (PROAIR HFA) 108 (90 BASE) MCG/ACT INHALER    Inhale 1-2 puffs into the lungs every 6 (six) hours as needed for wheezing or shortness of breath.   ALPRAZOLAM (XANAX) 1 MG TABLET    Take 1 mg by mouth 4 (four) times daily as needed for anxiety.    AMPHETAMINE-DEXTROAMPHETAMINE (ADDERALL XR) 30 MG 24 HR CAPSULE    Take 60 mg by mouth daily.   BROMPHENIRAMINE-PSEUDOEPHEDRINE-DM 30-2-10 MG/5ML SYRUP    Take 5 mLs by mouth 4 (four) times daily as needed.   DOXEPIN (SINEQUAN) 25 MG CAPSULE    Take 50 mg by mouth at bedtime as needed (sleep).    DULOXETINE (CYMBALTA) 60 MG CAPSULE    Take 120 mg by mouth daily.    FLUTICASONE (FLONASE) 50 MCG/ACT NASAL SPRAY    Place 2 sprays into both nostrils daily.   FLUTICASONE-SALMETEROL (ADVAIR) 250-50 MCG/DOSE AEPB    Inhale 1 puff into the lungs 2 (two) times daily.   INVESTIGATIONAL - STUDY  MEDICATION    Inject 1 Dose into the muscle every 14 (fourteen) days. Study name: [facility] Additional study details: study medication for RA   IPRATROPIUM-ALBUTEROL (DUONEB) 0.5-2.5 (3) MG/3ML SOLN    Take 3 mLs by nebulization every 6 (six) hours as needed (shortness of breath).   LORATADINE (CLARITIN) 10 MG TABLET    Take 1 tablet by mouth daily.   LOSARTAN (COZAAR) 50 MG TABLET    Take 1 tablet (50 mg total) by mouth daily.    MEDROXYPROGESTERONE (DEPO-PROVERA) 150 MG/ML INJECTION    Inject 1 mL (150 mg total) into the muscle every 3 (three) months.   MONTELUKAST (SINGULAIR) 10 MG TABLET    Take 1 tablet by mouth daily.   OMEPRAZOLE (PRILOSEC) 20 MG CAPSULE    Take 1 capsule (20 mg total) by mouth daily.   OXYCODONE HCL 10 MG TABS    Take 1 tablet (10 mg total) by mouth every 6 (six) hours as needed. For severe pain   PROPRANOLOL ER (INDERAL LA) 160 MG SR CAPSULE    Take 1 capsule (160 mg total) by mouth daily. Reported on 12/13/2015  Modified Medications   No medications on file  Discontinued Medications   No medications on file    HPI: Susan Gilmore is a 51 y.o. female with a history of rheumatoid arthritis. She has had repeated flares of her arthritis despite therapy with prednisone, methotrexate, Humira and Enbrel. She is currently on an investigational study drug, olokizumab, an anti-interleukin-6 immunomodulator. She has been receiving 64 mg subcutaneous every 4 weeks. Dosing has been suspended pending my evaluation and recommendation. She had a negative QuantiFERON gold TB assay on 01/02/2016. When repeated on 06/03/2017 it was positive.  The value of the positive test in international units per mL was not given.   She has no known history of exposure to anyone with active tuberculosis. She used to work in a [employer] but has been retired for many years. She tells me that she stays at home and rarely goes out. She does not know how she could've been exposed to tuberculosis within the last year. She is a former smoker. She says that she has had repeated pneumonia in the past. She has not had a recent chest x-ray. She denies any recent fevers, chills, night sweats, no cough, anorexia or weight loss.  Review of Systems: Review of Systems  Constitutional: Negative for chills, diaphoresis, fever, malaise/fatigue and weight loss.  HENT: Negative for sore throat.   Respiratory: Negative for cough, hemoptysis, sputum  production and shortness of breath.   Cardiovascular: Negative for chest pain.  Gastrointestinal: Negative for abdominal pain, diarrhea, heartburn, nausea and vomiting.  Genitourinary: Negative for dysuria and frequency.  Musculoskeletal: Positive for joint pain and myalgias.  Skin: Negative for rash.  Neurological: Negative for dizziness and headaches.      Past Medical History:  Diagnosis Date  . Abnormal Pap smear 04/28/2012   normal pap /positive hpv   . Abnormal stress test   . Allergic rhinitis   . Bipolar disorder ([place])   . Carpal tunnel syndrome   . Chest pain   . Chronic anxiety   . Contraception   . Cough   . HPV (human papilloma virus) anogenital infection   . HTN (hypertension)   . Hyperplastic colon polyp   . Insomnia   . Palpitations   . Pneumonia     Social History  Substance Use Topics  . Smoking status: Former Smoker  Packs/day: 1.00    Years: 26.00    Types: Cigarettes    Quit date: 07/26/2013  . Smokeless tobacco: Never Used  . Alcohol use No     Comment: Had a history of alcohol use but currently is not drinking any    Family History  Problem Relation Age of Onset  . Stroke Paternal Grandmother   . Heart disease Paternal Grandmother   . Diabetes Maternal Grandmother   . Heart disease Maternal Grandmother   . Hypertension Maternal Grandmother   . Heart disease Maternal Grandfather   . Hypertension Maternal Grandfather   . Heart disease Father   . Stroke Father   . Hypertension Father   . Hyperlipidemia Father   . Diabetes Mother   . Heart attack Mother    Allergies  Allergen Reactions  . Effexor [Venlafaxine Hcl] Swelling  . Isosorbide Nitrate Other (See Comments)    Reaction: Bad headache    OBJECTIVE: Vitals:   06/12/17 1027  Temp: 98.6 F (37 C)  TempSrc: Oral  Weight: 186 lb (84.4 kg)  Height: 5' (1.524 m)   Body mass index is 36.33 kg/m.   Physical Exam  Constitutional: She is oriented to person, place, and time.    She is pleasant and in no distress.  HENT:  Mouth/Throat: No oropharyngeal exudate.  Cardiovascular: Normal rate and regular rhythm.   No murmur heard. Pulmonary/Chest: Effort normal and breath sounds normal.  Abdominal: Soft. She exhibits no distension. There is no tenderness.  Musculoskeletal:  Some swelling of her knuckles. Mild pain with range of motion of her wrists and shoulders.  Neurological: She is alert and oriented to person, place, and time.  Skin: No rash noted.  Psychiatric: Mood and affect normal.   Labs: Lab Results  Component Value Date   WBC 7.6 06/12/2017   HGB 14.3 06/12/2017   HCT 41.6 06/12/2017   MCV 87.9 06/12/2017   PLT 198 06/12/2017   Lab Results  Component Value Date   CREATININE 0.94 06/12/2017   BUN 15 06/12/2017   NA 139 06/12/2017   K 4.1 06/12/2017   CL 106 06/12/2017   CO2 25 06/12/2017   Lab Results  Component Value Date   ALT 27 06/12/2017   AST 18 06/12/2017   ALKPHOS 86 06/22/2014   BILITOT 1.2 06/12/2017   Microbiology: No results found for this or any previous visit (from the past 240 hour(s)).   CXR 06/12/2017: CHEST  2 VIEW  COMPARISON:  02/22/2017  FINDINGS: Heart and mediastinal contours are within normal limits. No focal opacities or effusions. No acute bony abnormality.  IMPRESSION: No active cardiopulmonary disease.   Electronically Signed   By: Rolm Baptise M.D.   On: 06/12/2017 11:58   Michel Bickers, [facility] [phone] pager   [phone] cell 06/13/2017, 10:08 AM

## 2017-06-13 NOTE — Telephone Encounter (Signed)
Spoke with study coordinator at [facility], Susan Gilmore. Patient's study drug is Olokizumab ([id number]), 64 mg subcutaneous every 4 weeks.  Dosing temporarily suspended pending ID consult. Will fax notes to [phone] once complete. Landis Gandy, RN

## 2017-06-16 ENCOUNTER — Telehealth: Payer: Self-pay | Admitting: Internal Medicine

## 2017-06-16 NOTE — Telephone Encounter (Signed)
Spoke with study coordinator at [facility], Elmyra Ricks. Patient's study drug is Olokizumab ([id number]), 64 mg subcutaneous every 4 weeks.  Dosing temporarily suspended pending ID consult. Will fax notes to [phone] once complete. Landis Gandy, RN

## 2017-06-18 ENCOUNTER — Ambulatory Visit (INDEPENDENT_AMBULATORY_CARE_PROVIDER_SITE_OTHER): Payer: Medicare Other | Admitting: Pharmacist Clinician (PhC)/ Clinical Pharmacy Specialist

## 2017-06-18 DIAGNOSIS — R7611 Nonspecific reaction to tuberculin skin test without active tuberculosis: Secondary | ICD-10-CM | POA: Diagnosis not present

## 2017-06-18 DIAGNOSIS — Z227 Latent tuberculosis: Secondary | ICD-10-CM

## 2017-06-18 NOTE — Progress Notes (Signed)
HPI: Susan Gilmore is a 51 y.o. female who is here to see pharmacy to do repeat labs for latent TB  Allergies: Allergies  Allergen Reactions  . Effexor [Venlafaxine Hcl] Swelling  . Isosorbide Nitrate Other (See Comments)    Reaction: Bad headache    Vitals:    Past Medical History: Past Medical History:  Diagnosis Date  . Abnormal Pap smear 04/28/2012   normal pap /positive hpv   . Abnormal stress test   . Allergic rhinitis   . Bipolar disorder ([place])   . Carpal tunnel syndrome   . Chest pain   . Chronic anxiety   . Contraception   . Cough   . HPV (human papilloma virus) anogenital infection   . HTN (hypertension)   . Hyperplastic colon polyp   . Insomnia   . Palpitations   . Pneumonia     Social History: Social History   Social History  . Marital status: Married    Spouse name: N/A  . Number of children: N/A  . Years of education: N/A   Social History Main Topics  . Smoking status: Former Smoker    Packs/day: 1.00    Years: 26.00    Types: Cigarettes    Quit date: 07/26/2013  . Smokeless tobacco: Never Used  . Alcohol use No     Comment: Had a history of alcohol use but currently is not drinking any  . Drug use: No  . Sexual activity: Yes    Birth control/ protection: Injection     Comment: depo   Other Topics Concern  . Not on file   Social History Narrative   The patient is married. She has one stepson. She does not work outside the home. She smokes 1-1/2-2 packs of cigarettes daily.    Labs: No results found for: HIV1RNAQUANT, HIV1RNAVL, CD4TABS, HEPBSAB, HEPBSAG, HCVAB  CrCl: Estimated Creatinine Clearance: 69.8 mL/min (by C-G formula based on SCr of 0.94 mg/dL).  Lipids:    Component Value Date/Time   CHOL 166 06/22/2014 1033   TRIG 175 06/22/2014 1033   HDL 39 (L) 06/22/2014 1033   VLDL 35 06/22/2014 1033   LDLCALC 92 06/22/2014 1033    Assessment: Emer saw Dr. Megan Salon last week for a possible LTBI treatment. She recently had a  positive quantiferon gold test before her RA treatment with olokizumab. They only had a qualitative test. Dr. Megan Salon, would like to repeat the test today but a quantitative test. Santiago Glad confirmed with [facility] to make sure that the test is quantitative.   Recommendations:  Quantitative quantiferon gold Dr. Megan Salon will reach out  Onnie Boer, PharmD, BCPS, AAHIVP, CPP Clinical Infectious Disease [facility] 06/18/2017, 2:17 PM

## 2017-06-20 LAB — QUANTIFERON TB GOLD ASSAY (BLOOD)
Mitogen-Nil: 4.36 IU/mL
QUANTIFERON(R)-TB GOLD: NEGATIVE
Quantiferon Nil Value: 0.06 IU/mL
Quantiferon Tb Ag Minus Nil Value: 0.02 IU/mL

## 2017-06-23 NOTE — Telephone Encounter (Signed)
Information faxed to [facility] [phone]

## 2017-07-02 ENCOUNTER — Telehealth: Payer: Self-pay

## 2017-07-02 DIAGNOSIS — M069 Rheumatoid arthritis, unspecified: Secondary | ICD-10-CM

## 2017-07-02 DIAGNOSIS — G8929 Other chronic pain: Secondary | ICD-10-CM

## 2017-07-02 MED ORDER — OXYCODONE HCL 10 MG PO TABS: 10.0000 mg | ORAL_TABLET | Freq: Four times a day (QID) | ORAL | 0 refills | Status: DC | PRN

## 2017-07-02 NOTE — Telephone Encounter (Signed)
NCCSR reviewed and Rx printed. 

## 2017-07-02 NOTE — Telephone Encounter (Signed)
Patient is requesting a refill on  Oxycodone HCl 10 MG TABS CB# [phone]

## 2017-07-02 NOTE — Telephone Encounter (Signed)
Left message that prescription placed up front ready for pick up.  Thanks,  -Kabe Mckoy

## 2017-07-10 ENCOUNTER — Encounter: Payer: Self-pay | Admitting: Physician Assistant

## 2017-08-07 ENCOUNTER — Ambulatory Visit (INDEPENDENT_AMBULATORY_CARE_PROVIDER_SITE_OTHER): Payer: Medicare Other | Admitting: Physician Assistant

## 2017-08-07 DIAGNOSIS — Z3042 Encounter for surveillance of injectable contraceptive: Secondary | ICD-10-CM

## 2017-08-07 MED ORDER — MEDROXYPROGESTERONE ACETATE 150 MG/ML IM SUSP
150.0000 mg | Freq: Once | INTRAMUSCULAR | Status: AC
  Administered 2017-08-07: 150 mg via INTRAMUSCULAR

## 2017-08-07 NOTE — Progress Notes (Signed)
Nurse visit: Patient is here today for Depo Provera injection. Last injection was 05/22/17.  Today injection given on right Ventrogluteal. Patient to return Feb 28-March 14.

## 2017-08-12 ENCOUNTER — Other Ambulatory Visit: Payer: Self-pay | Admitting: Physician Assistant

## 2017-08-12 DIAGNOSIS — M069 Rheumatoid arthritis, unspecified: Secondary | ICD-10-CM

## 2017-08-12 DIAGNOSIS — G8929 Other chronic pain: Secondary | ICD-10-CM

## 2017-08-12 MED ORDER — OXYCODONE HCL 10 MG PO TABS: 10.0000 mg | ORAL_TABLET | Freq: Four times a day (QID) | ORAL | 0 refills | Status: DC | PRN

## 2017-08-12 NOTE — Telephone Encounter (Signed)
Pt contacted office for refill request on the following medications:  Oxycodone HCl 10 MG TABS   Pt stated she requested refill at her injection visit on 08/07/17 and called to see if it was ready. Please advise. Thanks TNP

## 2017-08-12 NOTE — Telephone Encounter (Signed)
NCCSR reviewed. Rx printed. 

## 2017-08-15 ENCOUNTER — Telehealth: Payer: Self-pay

## 2017-08-15 NOTE — Telephone Encounter (Signed)
Patient called that she was at the [facility] trying to fill out her Oxycodone but they need the doctor to approve it because is three days early. Per [name] is ok to get it filled today.  Thanks,  -Joseline

## 2017-08-24 ENCOUNTER — Other Ambulatory Visit: Payer: Self-pay | Admitting: Physician Assistant

## 2017-08-24 DIAGNOSIS — J4551 Severe persistent asthma with (acute) exacerbation: Secondary | ICD-10-CM

## 2017-08-24 DIAGNOSIS — J9621 Acute and chronic respiratory failure with hypoxia: Secondary | ICD-10-CM

## 2017-09-24 ENCOUNTER — Other Ambulatory Visit: Payer: Self-pay | Admitting: Physician Assistant

## 2017-09-24 DIAGNOSIS — G8929 Other chronic pain: Secondary | ICD-10-CM

## 2017-09-24 DIAGNOSIS — M069 Rheumatoid arthritis, unspecified: Secondary | ICD-10-CM

## 2017-09-24 MED ORDER — OXYCODONE HCL 10 MG PO TABS: 10.0000 mg | ORAL_TABLET | Freq: Four times a day (QID) | ORAL | 0 refills | Status: DC | PRN

## 2017-09-24 NOTE — Telephone Encounter (Signed)
NCCSR reviewed. Rx printed. 

## 2017-09-24 NOTE — Telephone Encounter (Signed)
Patient advised as directed below.  Thanks,  -Elayah Klooster 

## 2017-09-24 NOTE — Telephone Encounter (Signed)
Patient is requesting a refill on the following medication  Oxycodone HCl 10 MG TABS   She uses [facility]

## 2017-09-24 NOTE — Telephone Encounter (Signed)
Please review. Thanks!  

## 2017-10-02 ENCOUNTER — Telehealth: Payer: Self-pay

## 2017-10-02 DIAGNOSIS — M069 Rheumatoid arthritis, unspecified: Secondary | ICD-10-CM

## 2017-10-02 MED ORDER — PREDNISONE 10 MG PO TABS: ORAL_TABLET | ORAL | 0 refills | Status: DC

## 2017-10-02 NOTE — Telephone Encounter (Signed)
Sent to [facility] 

## 2017-10-02 NOTE — Telephone Encounter (Signed)
Please Review.  Thanks,  -Joseline 

## 2017-10-02 NOTE — Telephone Encounter (Signed)
Patient called saying that she is having another RA flare and is in a lot of pain. She reports that she does not have the $200 to pay the rheumatologist to be seen in their office this week. She was wanting to know if you would be willing to send in a round of prednisone into the pharmacy? [facility] [place]. Thanks!

## 2017-10-27 ENCOUNTER — Telehealth: Payer: Self-pay | Admitting: Physician Assistant

## 2017-10-27 NOTE — Telephone Encounter (Signed)
Pt stated that she takes ALPRAZolam [name]) 1 MG tablet that is written by another provider but because she is also taking Oxycodone HCl 10 MG TABS. Pt stated that [facility] [address] advised pt they can't fill the Rx for Xanax until they here from [place] that [name] is aware pt is on both medications and it is ok for pt to take both medications. Please advise. Thanks TNP

## 2017-10-27 NOTE — Telephone Encounter (Signed)
Please Review

## 2017-10-27 NOTE — Telephone Encounter (Signed)
Called the pharmacy and reports that they need to have documentation and advised as directed below. Spoke with Lisa-pharmacist. Per Lattie Haw now they just need to hear from [employer] office.  Thanks,  -Mehak Roskelley

## 2017-10-27 NOTE — Telephone Encounter (Signed)
Yes this is ok. She has been on both for a while now.

## 2017-10-29 ENCOUNTER — Ambulatory Visit (INDEPENDENT_AMBULATORY_CARE_PROVIDER_SITE_OTHER): Payer: Medicare Other | Admitting: Physician Assistant

## 2017-10-29 DIAGNOSIS — Z3042 Encounter for surveillance of injectable contraceptive: Secondary | ICD-10-CM | POA: Diagnosis not present

## 2017-10-29 MED ORDER — MEDROXYPROGESTERONE ACETATE 150 MG/ML IM SUSP
150.0000 mg | Freq: Once | INTRAMUSCULAR | Status: AC
  Administered 2017-10-29: 150 mg via INTRAMUSCULAR

## 2017-10-29 NOTE — Progress Notes (Signed)
Nurse visit: Patient is here today for Depo Provera injection. Last injection was 08/07/17.  Today injection given on Left Ventrogluteal. Patient to return May 22 - Jun 4. Patient scheduled for May 30,2019 at 8 am.

## 2017-11-11 ENCOUNTER — Other Ambulatory Visit: Payer: Self-pay | Admitting: Physician Assistant

## 2017-11-11 DIAGNOSIS — G8929 Other chronic pain: Secondary | ICD-10-CM

## 2017-11-11 DIAGNOSIS — M069 Rheumatoid arthritis, unspecified: Secondary | ICD-10-CM

## 2017-11-11 MED ORDER — OXYCODONE HCL 10 MG PO TABS: 10.0000 mg | ORAL_TABLET | Freq: Four times a day (QID) | ORAL | 0 refills | Status: DC | PRN

## 2017-11-11 NOTE — Telephone Encounter (Signed)
Patient needs refills on Oxycodone 10 mg. [facility] [address]  And also asking for a sample of Advair 250/50 mg.

## 2017-11-11 NOTE — Telephone Encounter (Signed)
NCCSR reviewed. Rx sent in. 

## 2017-11-13 ENCOUNTER — Telehealth: Payer: Self-pay | Admitting: Physician Assistant

## 2017-11-13 NOTE — Telephone Encounter (Signed)
Patient is asking for samples of Advair 250/50 mg.

## 2017-11-13 NOTE — Telephone Encounter (Signed)
Patient advised of no Advair samples. sd

## 2017-12-10 ENCOUNTER — Other Ambulatory Visit: Payer: Self-pay | Admitting: Physician Assistant

## 2017-12-10 DIAGNOSIS — G8929 Other chronic pain: Secondary | ICD-10-CM

## 2017-12-10 DIAGNOSIS — M069 Rheumatoid arthritis, unspecified: Secondary | ICD-10-CM

## 2017-12-10 DIAGNOSIS — J4540 Moderate persistent asthma, uncomplicated: Secondary | ICD-10-CM

## 2017-12-10 MED ORDER — OXYCODONE HCL 10 MG PO TABS: 10.0000 mg | ORAL_TABLET | Freq: Four times a day (QID) | ORAL | 0 refills | Status: DC | PRN

## 2017-12-10 MED ORDER — ALBUTEROL SULFATE HFA 108 (90 BASE) MCG/ACT IN AERS: 1.0000 | INHALATION_SPRAY | Freq: Four times a day (QID) | RESPIRATORY_TRACT | 11 refills | Status: DC | PRN

## 2017-12-10 NOTE — Telephone Encounter (Signed)
NCCSR reviewed. 

## 2017-12-10 NOTE — Telephone Encounter (Signed)
Please review. Thanks!  

## 2017-12-10 NOTE — Telephone Encounter (Signed)
Pt needs refill on her   Oxycodone 10 mg and  Albuterol solution    [facility]   Thanks teri

## 2018-01-07 ENCOUNTER — Other Ambulatory Visit: Payer: Self-pay

## 2018-01-07 DIAGNOSIS — G8929 Other chronic pain: Secondary | ICD-10-CM

## 2018-01-07 DIAGNOSIS — J4551 Severe persistent asthma with (acute) exacerbation: Secondary | ICD-10-CM

## 2018-01-07 DIAGNOSIS — J9621 Acute and chronic respiratory failure with hypoxia: Secondary | ICD-10-CM

## 2018-01-07 DIAGNOSIS — M069 Rheumatoid arthritis, unspecified: Secondary | ICD-10-CM

## 2018-01-07 MED ORDER — IPRATROPIUM-ALBUTEROL 0.5-2.5 (3) MG/3ML IN SOLN: 3.0000 mL | Freq: Four times a day (QID) | RESPIRATORY_TRACT | 5 refills | Status: DC | PRN

## 2018-01-07 NOTE — Telephone Encounter (Signed)
Patient called requesting a refill on her pain medication. She also needs a refill on her nebulizer treatment. I did not see it listed on her med list. [facility]. Thanks!

## 2018-01-07 NOTE — Telephone Encounter (Signed)
Duoneb sent in. Patient is 5 days early on oxycodone.

## 2018-01-08 NOTE — Telephone Encounter (Signed)
Patient advised.

## 2018-01-13 ENCOUNTER — Other Ambulatory Visit: Payer: Self-pay | Admitting: Physician Assistant

## 2018-01-13 DIAGNOSIS — G8929 Other chronic pain: Secondary | ICD-10-CM

## 2018-01-13 DIAGNOSIS — M069 Rheumatoid arthritis, unspecified: Secondary | ICD-10-CM

## 2018-01-13 MED ORDER — OXYCODONE HCL 10 MG PO TABS: 10.0000 mg | ORAL_TABLET | Freq: Four times a day (QID) | ORAL | 0 refills | Status: DC | PRN

## 2018-01-13 NOTE — Telephone Encounter (Signed)
Pt called back about her oxycodone.  She said that it is not 5 days early.   She use to refill every 45 days now its every 30 days  She said she never received a call yesterday about the refill.  [facility] uses [facility]  Patients call back  is  [phone]  [employer]

## 2018-01-13 NOTE — Telephone Encounter (Signed)
NCCSR reviewed. Rx sent in. 

## 2018-01-22 ENCOUNTER — Ambulatory Visit: Payer: Self-pay | Admitting: Physician Assistant

## 2018-01-23 ENCOUNTER — Other Ambulatory Visit: Payer: Self-pay

## 2018-01-23 ENCOUNTER — Ambulatory Visit (INDEPENDENT_AMBULATORY_CARE_PROVIDER_SITE_OTHER): Payer: Medicare Other | Admitting: Family Medicine

## 2018-01-23 DIAGNOSIS — Z3042 Encounter for surveillance of injectable contraceptive: Secondary | ICD-10-CM

## 2018-01-23 MED ORDER — MEDROXYPROGESTERONE ACETATE 150 MG/ML IM SUSP
150.0000 mg | Freq: Once | INTRAMUSCULAR | Status: AC
  Administered 2018-01-23: 150 mg via INTRAMUSCULAR

## 2018-01-23 MED ORDER — MEDROXYPROGESTERONE ACETATE 150 MG/ML IM SUSP: 150.0000 mg | INTRAMUSCULAR | 3 refills | Status: DC

## 2018-01-23 NOTE — Progress Notes (Signed)
Nurse visit: Patient is here today for Depo Provera injection. Last injection was 10/29/2017.  Today's injection was given on Right Ventrogluteal. Patient to return August 16-30. Patient scheduled for April 13, 2018 at 8:20 am.  Pt needs a refill of this medication sent to [facility]. Refill request sent to PCP.

## 2018-01-23 NOTE — Telephone Encounter (Signed)
Pt requesting refill of Depo Provera.

## 2018-02-03 ENCOUNTER — Telehealth: Payer: Self-pay

## 2018-02-03 NOTE — Telephone Encounter (Signed)
Patient reports she has been using her Albuterol inher

## 2018-02-03 NOTE — Telephone Encounter (Signed)
Patient scheduled to come in tomorrow at 8 am for cough going on for a month.

## 2018-02-03 NOTE — Telephone Encounter (Signed)
Called pt to schedule AWV and pt scheduled for 02/16/18.  At the end of the call pt requested a CB from [facility] nurse. Pt stated she needed some "advise." Please advise, thank you.

## 2018-02-04 ENCOUNTER — Ambulatory Visit (INDEPENDENT_AMBULATORY_CARE_PROVIDER_SITE_OTHER): Payer: Medicare Other | Admitting: Physician Assistant

## 2018-02-04 ENCOUNTER — Encounter: Payer: Self-pay | Admitting: Physician Assistant

## 2018-02-04 VITALS — BP 150/80 | HR 67 | Temp 97.9°F | Resp 20 | Wt 206.0 lb

## 2018-02-04 DIAGNOSIS — B370 Candidal stomatitis: Secondary | ICD-10-CM

## 2018-02-04 DIAGNOSIS — J441 Chronic obstructive pulmonary disease with (acute) exacerbation: Secondary | ICD-10-CM

## 2018-02-04 DIAGNOSIS — R059 Cough, unspecified: Secondary | ICD-10-CM

## 2018-02-04 DIAGNOSIS — N3000 Acute cystitis without hematuria: Secondary | ICD-10-CM | POA: Diagnosis not present

## 2018-02-04 DIAGNOSIS — R8290 Unspecified abnormal findings in urine: Secondary | ICD-10-CM

## 2018-02-04 DIAGNOSIS — R05 Cough: Secondary | ICD-10-CM

## 2018-02-04 DIAGNOSIS — R300 Dysuria: Secondary | ICD-10-CM

## 2018-02-04 DIAGNOSIS — Z6841 Body Mass Index (BMI) 40.0 and over, adult: Secondary | ICD-10-CM

## 2018-02-04 LAB — POCT URINALYSIS DIPSTICK
Bilirubin, UA: NEGATIVE
GLUCOSE UA: NEGATIVE
Ketones, UA: NEGATIVE
NITRITE UA: NEGATIVE
PROTEIN UA: NEGATIVE
Spec Grav, UA: 1.01 (ref 1.010–1.025)
Urobilinogen, UA: 0.2 E.U./dL
pH, UA: 6.5 (ref 5.0–8.0)

## 2018-02-04 MED ORDER — PREDNISONE 10 MG PO TABS: ORAL_TABLET | ORAL | 0 refills | Status: DC

## 2018-02-04 MED ORDER — NYSTATIN 100000 UNIT/ML MT SUSP: 5.0000 mL | Freq: Four times a day (QID) | OROMUCOSAL | 0 refills | Status: DC

## 2018-02-04 MED ORDER — DOXYCYCLINE HYCLATE 100 MG PO TABS: 100.0000 mg | ORAL_TABLET | Freq: Two times a day (BID) | ORAL | 0 refills | Status: DC

## 2018-02-04 MED ORDER — PSEUDOEPH-BROMPHEN-DM 30-2-10 MG/5ML PO SYRP
5.0000 mL | ORAL_SOLUTION | Freq: Four times a day (QID) | ORAL | 0 refills | Status: DC | PRN
Start: 2018-02-04 — End: 2018-04-24

## 2018-02-04 NOTE — Patient Instructions (Signed)
Chronic Obstructive Pulmonary Disease Exacerbation  Chronic obstructive pulmonary disease (COPD) is a common lung problem. In COPD, the flow of air from the lungs is limited. COPD exacerbations are times that breathing gets worse and you need extra treatment. Without treatment they can be life threatening. If they happen often, your lungs can become more damaged. If your COPD gets worse, your doctor may treat you with:  ? Medicines.  ? Oxygen.  ? Different ways to clear your airway, such as using a mask.    Follow these instructions at home:  ? Do not smoke.  ? Avoid tobacco smoke and other things that bother your lungs.  ? If given, take your antibiotic medicine as told. Finish the medicine even if you start to feel better.  ? Only take medicines as told by your doctor.  ? Drink enough fluids to keep your pee (urine) clear or pale yellow (unless your doctor has told you not to).  ? Use a cool mist machine (vaporizer).  ? If you use oxygen or a machine that turns liquid medicine into a mist (nebulizer), continue to use them as told.  ? Keep up with shots (vaccinations) as told by your doctor.  ? Exercise regularly.  ? Eat healthy foods.  ? Keep all doctor visits as told.  Get help right away if:  ? You are very short of breath and it gets worse.  ? You have trouble talking.  ? You have bad chest pain.  ? You have blood in your spit (sputum).  ? You have a fever.  ? You keep throwing up (vomiting).  ? You feel weak, or you pass out (faint).  ? You feel confused.  ? You keep getting worse.  This information is not intended to replace advice given to you by your health care provider. Make sure you discuss any questions you have with your health care provider.  Document Released: 08/01/2011 Document Revised: 01/18/2016 Document Reviewed: 04/16/2013  Elsevier Interactive Patient Education ? 2017 [employer]

## 2018-02-04 NOTE — Progress Notes (Signed)
Patient: Susan Gilmore Female    DOB: [birth year]   52 y.o.   MRN: [id number] Visit Date: 02/04/2018  Today's Provider: [name], PA-C   Chief Complaint  Patient presents with  . Cough   Subjective:    Cough  The current episode started 1 to 4 weeks ago. The problem has been gradually worsening. The problem occurs constantly. The cough is non-productive. Associated symptoms include ear congestion, headaches, a sore throat ("recently" "feels like is burning" feels is coming from the breathing treatments.), shortness of breath and wheezing. Pertinent negatives include no chest pain, ear pain, fever, nasal congestion, postnasal drip or rhinorrhea. The symptoms are aggravated by lying down and other ("Movement"). She has tried ipratropium inhaler (Duoneb treatments every 2-4 hours) for the symptoms. The treatment provided no relief. Her past medical history is significant for COPD.   Patient also reports that she has been wearing depend underwear and feels she might have a UTI.she reports she started wearing them because of her coughing too much she urinates on herself. Now she reports it burns when she urinates.    Allergies  Allergen Reactions  . Effexor [Venlafaxine Hcl] Swelling  . Isosorbide Nitrate Other (See Comments)    Reaction: Bad headache     Current Outpatient Medications:  .  albuterol (PROAIR HFA) 108 (90 Base) MCG/ACT inhaler, Inhale 1-2 puffs into the lungs every 6 (six) hours as needed for wheezing or shortness of breath., Disp: 18 g, Rfl: 11 .  ALPRAZolam (XANAX) 1 MG tablet, Take 1 mg by mouth 4 (four) times daily as needed for anxiety. , Disp: , Rfl:  .  amphetamine-dextroamphetamine (ADDERALL XR) 30 MG 24 hr capsule, Take 60 mg by mouth daily., Disp: , Rfl: 0 .  DULoxetine (CYMBALTA) 60 MG capsule, Take 120 mg by mouth daily. , Disp: , Rfl:  .  fluticasone (FLONASE) 50 MCG/ACT nasal spray, Place 2 sprays into both nostrils daily. (Patient taking  differently: Place 2 sprays into both nostrils 2 (two) times daily. ), Disp: 16 g, Rfl: 11 .  Investigational - Study Medication, Inject 1 Dose into the muscle every 14 (fourteen) days. Study name: Cred[facility] Additional study details: study medication for RA, Disp: , Rfl:  .  ipratropium-albuterol (DUONEB) 0.5-2.5 (3) MG/3ML SOLN, Take 3 mLs by nebulization every 6 (six) hours as needed (shortness of breath)., Disp: 360 mL, Rfl: 5 .  loratadine (CLARITIN) 10 MG tablet, Take 1 tablet by mouth daily., Disp: , Rfl:  .  losartan (COZAAR) 50 MG tablet, Take 1 tablet (50 mg total) by mouth daily., Disp: 30 tablet, Rfl: 5 .  medroxyPROGESTERone (DEPO-PROVERA) 150 MG/ML injection, Inject 1 mL (150 mg total) into the muscle every 3 (three) months., Disp: 1 mL, Rfl: 3 .  montelukast (SINGULAIR) 10 MG tablet, Take 1 tablet by mouth daily., Disp: , Rfl:  .  Oxycodone HCl 10 MG TABS, Take 1 tablet (10 mg total) by mouth every 6 (six) hours as needed. For severe pain, Disp: 180 tablet, Rfl: 0 .  propranolol ER (INDERAL LA) 160 MG SR capsule, Take 1 capsule (160 mg total) by mouth daily. Reported on 12/13/2015, Disp: 90 capsule, Rfl: 3 .  brompheniramine-pseudoephedrine-DM 30-2-10 MG/5ML syrup, Take 5 mLs by mouth 4 (four) times daily as needed. (Patient not taking: Reported on 06/12/2017), Disp: 120 mL, Rfl: 0 .  Fluticasone-Salmeterol (ADVAIR) 250-50 MCG/DOSE AEPB, Inhale 1 puff into the lungs 2 (two) times daily., Disp: , Rfl:  .  omeprazole (PRILOSEC) 20 MG capsule, Take 1 capsule (20 mg total) by mouth daily. (Patient not taking: Reported on 02/04/2018), Disp: 30 capsule, Rfl: 11 .  predniSONE (DELTASONE) 10 MG tablet, Take 6 tabs PO on day 1&2, 5 tabs PO on day 3&4, 4 tabs PO on day 5&6, 3 tabs PO on day 7&8, 2 tabs PO on day 9&10, 1 tab PO on day 11&12. (Patient not taking: Reported on 02/04/2018), Disp: 42 tablet, Rfl: 0  Review of Systems  Constitutional: Positive for fatigue. Negative for fever.  HENT: Positive  for congestion and sore throat ("recently" "feels like is burning" feels is coming from the breathing treatments.). Negative for ear pain, postnasal drip and rhinorrhea.   Respiratory: Positive for cough, chest tightness, shortness of breath and wheezing.   Cardiovascular: Negative for chest pain, palpitations and leg swelling.  Gastrointestinal: Negative for abdominal pain.  Genitourinary: Positive for dysuria. Negative for flank pain and hematuria.  Neurological: Positive for dizziness ("sometimes") and headaches.    Social History   Tobacco Use  . Smoking status: Former Smoker    Packs/day: 1.00    Years: 26.00    Pack years: 26.00    Types: Cigarettes    Last attempt to quit: 07/26/2013    Years since quitting: 4.5  . Smokeless tobacco: Never Used  Substance Use Topics  . Alcohol use: No    Alcohol/week: 0.0 oz    Comment: Had a history of alcohol use but currently is not drinking any   Objective:   BP (!) 150/80 (BP Location: Left Wrist, Patient Position: Sitting, Cuff Size: Normal)   Pulse 67   Temp 97.9 F (36.6 C) (Oral)   Resp 20   Wt 206 lb (93.4 kg)   SpO2 97%   BMI 40.23 kg/m    Physical Exam  Constitutional: She appears well-developed and well-nourished. No distress.  HENT:  Head: Normocephalic and atraumatic.  Right Ear: Hearing, tympanic membrane, external ear and ear canal normal.  Left Ear: Hearing, tympanic membrane, external ear and ear canal normal.  Nose: Nose normal.  Mouth/Throat: Uvula is midline and mucous membranes are normal. Posterior oropharyngeal erythema present. No oropharyngeal exudate.  Eyes: Pupils are equal, round, and reactive to light. Conjunctivae are normal. Right eye exhibits no discharge. Left eye exhibits no discharge. No scleral icterus.  Neck: Normal range of motion. Neck supple. No tracheal deviation present. No thyromegaly present.  Cardiovascular: Normal rate, regular rhythm and normal heart sounds. Exam reveals no gallop  and no friction rub.  No murmur heard. Pulmonary/Chest: Effort normal. No stridor. No respiratory distress. She has decreased breath sounds. She has wheezes. She has no rales.  Abdominal: Soft. Bowel sounds are normal. There is no tenderness. There is no CVA tenderness.  Lymphadenopathy:    She has no cervical adenopathy.  Skin: Skin is warm and dry. She is not diaphoretic.  Vitals reviewed.      Assessment & Plan:     1. Acute cystitis without hematuria Worsening symptoms. UA positive Will treat empirically with Doxycycline. Continue to push fluids. Urine sent for culture. Will follow up pending C&S results. She is to call if symptoms do not improve or if they worsen.  - doxycycline (VIBRA-TABS) 100 MG tablet; Take 1 tablet (100 mg total) by mouth 2 (two) times daily.  Dispense: 20 tablet; Refill: 0  2. Chronic obstructive pulmonary disease with acute exacerbation (HCC) Prednisone given for exacerbation not responding to nebulizer treatments. Reports cleaned with bleach without  a mask and this was inciting event.  - predniSONE (DELTASONE) 10 MG tablet; Take 6 tabs PO on day 1&2, 5 tabs PO on day 3&4, 4 tabs PO on day 5&6, 3 tabs PO on day 7&8, 2 tabs PO on day 9&10, 1 tab PO on day 11&12.  Dispense: 42 tablet; Refill: 0 - doxycycline (VIBRA-TABS) 100 MG tablet; Take 1 tablet (100 mg total) by mouth 2 (two) times daily.  Dispense: 20 tablet; Refill: 0  3. Burning with urination - POCT urinalysis dipstick - Urine Culture  4. Foul smelling urine - Urine Culture  5. Cough Bromfed-DM for cough as below.  - brompheniramine-pseudoephedrine-DM 30-2-10 MG/5ML syrup; Take 5 mLs by mouth 4 (four) times daily as needed.  Dispense: 120 mL; Refill: 0  6. BMI 40.0-44.9, adult [facility]) Counseled patient on healthy lifestyle modifications including dieting and exercise.   7. Thrush Secondary to nebulizer treatments.  - nystatin (MYCOSTATIN) 100000 UNIT/ML suspension; Take 5 mLs (500,000 Units  total) by mouth 4 (four) times daily.  Dispense: 100 mL; Refill: 0       [name], PA-C  [facility]

## 2018-02-05 ENCOUNTER — Encounter: Payer: Self-pay | Admitting: Physician Assistant

## 2018-02-06 ENCOUNTER — Telehealth: Payer: Self-pay

## 2018-02-06 LAB — URINE CULTURE

## 2018-02-06 NOTE — Telephone Encounter (Signed)
Patient advised as below. Patient reports she is doing better.  

## 2018-02-06 NOTE — Telephone Encounter (Signed)
-----   Message from [name], PA-C sent at 02/06/2018  1:58 PM EDT ----- Urine culture positive for e. Coli. It is susceptible to doxycycline. Continue until completed and call if no improvements.

## 2018-02-11 ENCOUNTER — Other Ambulatory Visit: Payer: Self-pay

## 2018-02-11 DIAGNOSIS — G8929 Other chronic pain: Secondary | ICD-10-CM

## 2018-02-11 DIAGNOSIS — M069 Rheumatoid arthritis, unspecified: Secondary | ICD-10-CM

## 2018-02-11 MED ORDER — OXYCODONE HCL 10 MG PO TABS: 10.0000 mg | ORAL_TABLET | Freq: Four times a day (QID) | ORAL | 0 refills | Status: DC | PRN

## 2018-02-11 NOTE — Telephone Encounter (Signed)
Patient called office requesting a refill on Oxycodone. KW

## 2018-02-11 NOTE — Telephone Encounter (Signed)
NCCSR reviewed. 

## 2018-02-16 ENCOUNTER — Ambulatory Visit (INDEPENDENT_AMBULATORY_CARE_PROVIDER_SITE_OTHER): Payer: Medicare Other

## 2018-02-16 VITALS — BP 186/94 | HR 96 | Temp 99.3°F | Ht 60.0 in | Wt 201.4 lb

## 2018-02-16 DIAGNOSIS — Z0000 Encounter for general adult medical examination without abnormal findings: Secondary | ICD-10-CM | POA: Diagnosis not present

## 2018-02-16 NOTE — Patient Instructions (Signed)
Susan Gilmore , Thank you for taking time to come for your Medicare Wellness Visit. I appreciate your ongoing commitment to your health goals. Please review the following plan we discussed and let me know if I can assist you in the future.   Screening recommendations/referrals: Colonoscopy: Pt declines today.  Mammogram: Pt declines today.  Bone Density: N/A Recommended yearly ophthalmology/optometry visit for glaucoma screening and checkup Recommended yearly dental visit for hygiene and checkup  Vaccinations: Influenza vaccine: Up to date Pneumococcal vaccine: Up to date Tdap vaccine: Up to date Shingles vaccine: N/A    Advanced directives: Advance directive discussed with you today. Even though you declined this today please call our office should you change your mind and we can give you the proper paperwork for you to fill out.  Conditions/risks identified: Obesity- recommend decreasing amount of soda by half and not exceeding 3 cans a day.   Next appointment: 04/13/18 (nurse visit)   Preventive Care 50 Years and Older, Female Preventive care refers to lifestyle choices and visits with your health care provider that can promote health and wellness. What does preventive care include?  A yearly physical exam. This is also called an annual well check.  Dental exams once or twice a year.  Routine eye exams. Ask your health care provider how often you should have your eyes checked.  Personal lifestyle choices, including:  Daily care of your teeth and gums.  Regular physical activity.  Eating a healthy diet.  Avoiding tobacco and drug use.  Limiting alcohol use.  Practicing safe sex.  Taking low-dose aspirin every day.  Taking vitamin and mineral supplements as recommended by your health care provider. What happens during an annual well check? The services and screenings done by your health care provider during your annual well check will depend on your age, overall  health, lifestyle risk factors, and family history of disease. Counseling  Your health care provider may ask you questions about your:  Alcohol use.  Tobacco use.  Drug use.  Emotional well-being.  Home and relationship well-being.  Sexual activity.  Eating habits.  History of falls.  Memory and ability to understand (cognition).  Work and work Statistician.  Reproductive health. Screening  You may have the following tests or measurements:  Height, weight, and BMI.  Blood pressure.  Lipid and cholesterol levels. These may be checked every 5 years, or more frequently if you are over 83 years old.  Skin check.  Lung cancer screening. You may have this screening every year starting at age 6 if you have a 30-pack-year history of smoking and currently smoke or have quit within the past 15 years.  Fecal occult blood test (FOBT) of the stool. You may have this test every year starting at age 61.  Flexible sigmoidoscopy or colonoscopy. You may have a sigmoidoscopy every 5 years or a colonoscopy every 10 years starting at age 50.  Hepatitis C blood test.  Hepatitis B blood test.  Sexually transmitted disease (STD) testing.  Diabetes screening. This is done by checking your blood sugar (glucose) after you have not eaten for a while (fasting). You may have this done every 1-3 years.  Bone density scan. This is done to screen for osteoporosis. You may have this done starting at age 34.  Mammogram. This may be done every 1-2 years. Talk to your health care provider about how often you should have regular mammograms. Talk with your health care provider about your test results, treatment options, and  if necessary, the need for more tests. Vaccines  Your health care provider may recommend certain vaccines, such as:  Influenza vaccine. This is recommended every year.  Tetanus, diphtheria, and acellular pertussis (Tdap, Td) vaccine. You may need a Td booster every 10  years.  Zoster vaccine. You may need this after age 52.  Pneumococcal 13-valent conjugate (PCV13) vaccine. One dose is recommended after age 82.  Pneumococcal polysaccharide (PPSV23) vaccine. One dose is recommended after age 40. Talk to your health care provider about which screenings and vaccines you need and how often you need them. This information is not intended to replace advice given to you by your health care provider. Make sure you discuss any questions you have with your health care provider. Document Released: 09/08/2015 Document Revised: 05/01/2016 Document Reviewed: 06/13/2015 Elsevier Interactive Patient Education  2017 [place] Prevention in the Home Falls can cause injuries. They can happen to people of all ages. There are many things you can do to make your home safe and to help prevent falls. What can I do on the outside of my home?  Regularly fix the edges of walkways and driveways and fix any cracks.  Remove anything that might make you trip as you walk through a door, such as a raised step or threshold.  Trim any bushes or trees on the path to your home.  Use bright outdoor lighting.  Clear any walking paths of anything that might make someone trip, such as rocks or tools.  Regularly check to see if handrails are loose or broken. Make sure that both sides of any steps have handrails.  Any raised decks and porches should have guardrails on the edges.  Have any leaves, snow, or ice cleared regularly.  Use sand or salt on walking paths during winter.  Clean up any spills in your garage right away. This includes oil or grease spills. What can I do in the bathroom?  Use night lights.  Install grab bars by the toilet and in the tub and shower. Do not use towel bars as grab bars.  Use non-skid mats or decals in the tub or shower.  If you need to sit down in the shower, use a plastic, non-slip stool.  Keep the floor dry. Clean up any water that  spills on the floor as soon as it happens.  Remove soap buildup in the tub or shower regularly.  Attach bath mats securely with double-sided non-slip rug tape.  Do not have throw rugs and other things on the floor that can make you trip. What can I do in the bedroom?  Use night lights.  Make sure that you have a light by your bed that is easy to reach.  Do not use any sheets or blankets that are too big for your bed. They should not hang down onto the floor.  Have a firm chair that has side arms. You can use this for support while you get dressed.  Do not have throw rugs and other things on the floor that can make you trip. What can I do in the kitchen?  Clean up any spills right away.  Avoid walking on wet floors.  Keep items that you use a lot in easy-to-reach places.  If you need to reach something above you, use a strong step stool that has a grab bar.  Keep electrical cords out of the way.  Do not use floor polish or wax that makes floors slippery. If you  must use wax, use non-skid floor wax.  Do not have throw rugs and other things on the floor that can make you trip. What can I do with my stairs?  Do not leave any items on the stairs.  Make sure that there are handrails on both sides of the stairs and use them. Fix handrails that are broken or loose. Make sure that handrails are as long as the stairways.  Check any carpeting to make sure that it is firmly attached to the stairs. Fix any carpet that is loose or worn.  Avoid having throw rugs at the top or bottom of the stairs. If you do have throw rugs, attach them to the floor with carpet tape.  Make sure that you have a light switch at the top of the stairs and the bottom of the stairs. If you do not have them, ask someone to add them for you. What else can I do to help prevent falls?  Wear shoes that:  Do not have high heels.  Have rubber bottoms.  Are comfortable and fit you well.  Are closed at the  toe. Do not wear sandals.  If you use a stepladder:  Make sure that it is fully opened. Do not climb a closed stepladder.  Make sure that both sides of the stepladder are locked into place.  Ask someone to hold it for you, if possible.  Clearly mark and make sure that you can see:  Any grab bars or handrails.  First and last steps.  Where the edge of each step is.  Use tools that help you move around (mobility aids) if they are needed. These include:  Canes.  Walkers.  Scooters.  Crutches.  Turn on the lights when you go into a dark area. Replace any light bulbs as soon as they burn out.  Set up your furniture so you have a clear path. Avoid moving your furniture around.  If any of your floors are uneven, fix them.  If there are any pets around you, be aware of where they are.  Review your medicines with your doctor. Some medicines can make you feel dizzy. This can increase your chance of falling. Ask your doctor what other things that you can do to help prevent falls. This information is not intended to replace advice given to you by your health care provider. Make sure you discuss any questions you have with your health care provider. Document Released: 06/08/2009 Document Revised: 01/18/2016 Document Reviewed: 09/16/2014 Elsevier Interactive Patient Education  2017 [employer].

## 2018-02-16 NOTE — Progress Notes (Signed)
Subjective:   Susan Gilmore is a 52 y.o. female who presents for Medicare Annual (Subsequent) preventive examination.  Review of Systems:  N/A  Cardiac Risk Factors include: obesity (BMI >30kg/m2);hypertension     Objective:     Vitals: BP (!) 186/94 (BP Location: Right Arm)   Pulse 96   Temp 99.3 F (37.4 C) (Oral)   Ht 5' (1.524 m)   Wt 201 lb 6.4 oz (91.4 kg)   BMI 39.33 kg/m   Body mass index is 39.33 kg/m.  Advanced Directives 02/16/2018 02/28/2017 02/23/2017 02/22/2017 02/13/2017 01/06/2016 11/08/2015  Does Patient Have a Medical Advance Directive? No No No No No No No  Would patient like information on creating a medical advance directive? No - Patient declined No - Patient declined No - Patient declined - No - Patient declined - No - patient declined information    Tobacco Social History   Tobacco Use  Smoking Status Former Smoker  . Packs/day: 1.00  . Years: 26.00  . Pack years: 26.00  . Types: Cigarettes  . Last attempt to quit: 07/26/2013  . Years since quitting: 4.5  Smokeless Tobacco Never Used     Counseling given: Not Answered   Clinical Intake:  Pre-visit preparation completed: Yes  Pain : No/denies pain Pain Score: 0-No pain     Nutritional Status: BMI > 30  Obese Nutritional Risks: None Diabetes: No  How often do you need to have someone help you when you read instructions, pamphlets, or other written materials from your doctor or pharmacy?: 1 - Never  Interpreter Needed?: No  Information entered by :: [facility], LPN  Past Medical History:  Diagnosis Date  . Abnormal Pap smear 04/28/2012   normal pap /positive hpv   . Abnormal stress test   . Allergic rhinitis   . Bipolar disorder ([place])   . Carpal tunnel syndrome   . Chest pain   . Chronic anxiety   . Contraception   . Cough   . HPV (human papilloma virus) anogenital infection   . HTN (hypertension)   . Hyperplastic colon polyp   . Insomnia   . Palpitations   . Pneumonia     Past Surgical History:  Procedure Laterality Date  . full mouth dental    . GANGLION CYST EXCISION    . WISDOM TOOTH EXTRACTION     Family History  Problem Relation Age of Onset  . Stroke Paternal Grandmother   . Heart disease Paternal Grandmother   . Diabetes Maternal Grandmother   . Heart disease Maternal Grandmother   . Hypertension Maternal Grandmother   . Heart disease Maternal Grandfather   . Hypertension Maternal Grandfather   . Heart disease Father   . Stroke Father   . Hypertension Father   . Hyperlipidemia Father   . Diabetes Mother   . Heart attack Mother    Social History   Socioeconomic History  . Marital status: Married    Spouse name: Not on file  . Number of children: 1  . Years of education: Not on file  . Highest education level: 10th grade  Occupational History  . Occupation: disability  Social Needs  . Financial resource strain: Somewhat hard  . Food insecurity:    Worry: Never true    Inability: Never true  . Transportation needs:    Medical: No    Non-medical: No  Tobacco Use  . Smoking status: Former Smoker    Packs/day: 1.00    Years: 26.00  Pack years: 26.00    Types: Cigarettes    Last attempt to quit: 07/26/2013    Years since quitting: 4.5  . Smokeless tobacco: Never Used  Substance and Sexual Activity  . Alcohol use: No    Alcohol/week: 0.0 oz    Comment: Had a history of alcohol use but currently is not drinking any  . Drug use: No  . Sexual activity: Yes    Birth control/protection: Injection    Comment: depo  Lifestyle  . Physical activity:    Days per week: Not on file    Minutes per session: Not on file  . Stress: Only a little  Relationships  . Social connections:    Talks on phone: Not on file    Gets together: Not on file    Attends religious service: Not on file    Active member of club or organization: Not on file    Attends meetings of clubs or organizations: Not on file    Relationship status: Not on  file  Other Topics Concern  . Not on file  Social History Narrative   The patient is married. She has one stepson. She does not work outside the home. She smokes 1-1/2-2 packs of cigarettes daily.    Outpatient Encounter Medications as of 02/16/2018  Medication Sig  . albuterol (PROAIR HFA) 108 (90 Base) MCG/ACT inhaler Inhale 1-2 puffs into the lungs every 6 (six) hours as needed for wheezing or shortness of breath.  . ALPRAZolam (XANAX) 1 MG tablet Take 1 mg by mouth 4 (four) times daily as needed for anxiety.   [name] amphetamine-dextroamphetamine (ADDERALL XR) 30 MG 24 hr capsule Take 60 mg by mouth daily.  . brompheniramine-pseudoephedrine-DM 30-2-10 MG/5ML syrup Take 5 mLs by mouth 4 (four) times daily as needed.  . DULoxetine (CYMBALTA) 60 MG capsule Take 120 mg by mouth daily.   . fluticasone (FLONASE) 50 MCG/ACT nasal spray Place 2 sprays into both nostrils daily. (Patient taking differently: Place 2 sprays into both nostrils 2 (two) times daily. )  . Fluticasone-Salmeterol (ADVAIR) 250-50 MCG/DOSE AEPB Inhale 1 puff into the lungs 2 (two) times daily.  [name] ipratropium-albuterol (DUONEB) 0.5-2.5 (3) MG/3ML SOLN Take 3 mLs by nebulization every 6 (six) hours as needed (shortness of breath).  . loratadine (CLARITIN) 10 MG tablet Take 1 tablet by mouth daily.  [name] losartan (COZAAR) 50 MG tablet Take 1 tablet (50 mg total) by mouth daily.  . medroxyPROGESTERone (DEPO-PROVERA) 150 MG/ML injection Inject 1 mL (150 mg total) into the muscle every 3 (three) months.  . montelukast (SINGULAIR) 10 MG tablet Take 1 tablet by mouth daily.  [name] nystatin (MYCOSTATIN) 100000 UNIT/ML suspension Take 5 mLs (500,000 Units total) by mouth 4 (four) times daily. (Patient taking differently: Take 5 mLs by mouth 4 (four) times daily. )  . Oxycodone HCl 10 MG TABS Take 1 tablet (10 mg total) by mouth every 6 (six) hours as needed. For severe pain  . propranolol ER (INDERAL LA) 160 MG SR capsule Take 1 capsule (160 mg  total) by mouth daily. Reported on 12/13/2015  . zolpidem (AMBIEN) 10 MG tablet Take 10 mg by mouth at bedtime.   [name] doxycycline (VIBRA-TABS) 100 MG tablet Take 1 tablet (100 mg total) by mouth 2 (two) times daily. (Patient not taking: Reported on 02/16/2018)  . Investigational - Study Medication Inject 1 Dose into the muscle every 14 (fourteen) days. Study name: Credo Additional study details: study medication for RA  . omeprazole (  PRILOSEC) 20 MG capsule Take 1 capsule (20 mg total) by mouth daily. (Patient not taking: Reported on 02/04/2018)  . predniSONE (DELTASONE) 10 MG tablet Take 6 tabs PO on day 1&2, 5 tabs PO on day 3&4, 4 tabs PO on day 5&6, 3 tabs PO on day 7&8, 2 tabs PO on day 9&10, 1 tab PO on day 11&12. (Patient not taking: Reported on 02/16/2018)   No facility-administered encounter medications on file as of 02/16/2018.     Activities of Daily Living In your present state of health, do you have any difficulty performing the following activities: 02/16/2018 02/23/2017  Hearing? N N  Vision? N Y  Difficulty concentrating or making decisions? Y N  Walking or climbing stairs? N Y  Dressing or bathing? N N  Doing errands, shopping? N N  Preparing Food and eating ? N -  Using the Toilet? N -  In the past six months, have you accidently leaked urine? N -  Do you have problems with loss of bowel control? N -  Managing your Medications? N -  Managing your Finances? N -  Housekeeping or managing your Housekeeping? N -  Some recent data might be hidden    Patient Care Team: Rubye Beach as PCP - General (Physician Assistant) Chucky May, MD as Consulting Physician (Psychiatry) Gavin Pound, MD as Consulting Physician (Rheumatology) Rosita Kea, PA-C as Consulting Physician (Family Medicine) Suella Broad, MD as Consulting Physician (Physical Medicine and Rehabilitation)    Assessment:   This is a routine wellness examination for [place].  Exercise Activities and  Dietary recommendations Current Exercise Habits: The patient does not participate in regular exercise at present, Exercise limited by: None identified  Goals    . DIET - DECREASE SODA OR JUICE INTAKE     Recommend decreasing amount of soda by half and not exceeding 3 cans a day.        Fall Risk Fall Risk  02/16/2018 06/12/2017 02/13/2017 03/06/2016  Falls in the past year? Yes Yes No No  Number falls in past yr: 1 1 - -  Injury with Fall? Yes Yes - -  Comment sprained ankle - - -  Follow up Falls prevention discussed - - -   Is the patient's home free of loose throw rugs in walkways, pet beds, electrical cords, etc?   yes      Grab bars in the bathroom? no      Handrails on the stairs?   yes      Adequate lighting?   yes  Timed Get Up and Go performed: N/A  Depression Screen PHQ 2/9 Scores 02/16/2018 02/13/2017 03/06/2016  PHQ - 2 Score 0 4 2  PHQ- 9 Score - 8 10     Cognitive Function: Pt declined screening today.      6CIT Screen 02/13/2017  What Year? 0 points  What month? 0 points  What time? 0 points  Count back from 20 0 points  Months in reverse 0 points  Repeat phrase 10 points  Total Score 10    Immunization History  Administered Date(s) Administered  . Influenza, Seasonal, Injecte, Preservative Fre 06/04/2017  . Influenza,inj,Quad PF,6+ Mos 06/05/2015  . Influenza-Unspecified 06/01/2014  . Pneumococcal Conjugate-13 06/05/2015  . Pneumococcal Polysaccharide-23 07/17/2012  . Td 07/12/2004  . Tdap 09/28/2010    Qualifies for Shingles Vaccine? N/A  Screening Tests Health Maintenance  Topic Date Due  . MAMMOGRAM  09/21/2017  . COLONOSCOPY  09/21/2017  .  PAP SMEAR  03/05/2018  . INFLUENZA VACCINE  03/26/2018  . TETANUS/TDAP  09/28/2020  . HIV Screening  Completed    Cancer Screenings: Lung: Low Dose CT Chest recommended if Age 37-80 years, 30 pack-year currently smoking OR have quit w/in 15years. Patient does not qualify. Breast:  Up to date on  Mammogram? No, pt declined screening at this time.    Up to date of Bone Density/Dexa? N/A Colorectal: Pt declines referral today. Information given on Cologuard and pt to speak further with PCP about this option.  Additional Screenings:  Hepatitis C Screening: N/A     Plan:  I have personally reviewed and addressed the Medicare Annual Wellness questionnaire and have noted the following in the patient's chart:  A. Medical and social history B. Use of alcohol, tobacco or illicit drugs  C. Current medications and supplements D. Functional ability and status E.  Nutritional status F.  Physical activity G. Advance directives H. List of other physicians I.  Hospitalizations, surgeries, and ER visits in previous 12 months J.  [place] such as hearing and vision if needed, cognitive and depression L. Referrals and appointments - none  In addition, I have reviewed and discussed with patient certain preventive protocols, quality metrics, and best practice recommendations. A written personalized care plan for preventive services as well as general preventive health recommendations were provided to patient.  See attached scanned questionnaire for additional information.   Signed,  Fabio Neighbors, LPN Nurse Health Advisor   Nurse Recommendations: Pt declined completing a mammogram this year. Also pt declined a colonoscopy referral. Information given on the cologuard. Pt would like to speak with PCP about this further at her next OV. Advised pt to keep check on BP (pt had two high readings today). Per PCP- ok to wait to be seen on 02/18/18 (recheck) and advise pt to CB if having any SOB, chest tightness, blurred vision or fatigue. Pt declined s/s currently and stated understanding.

## 2018-02-18 ENCOUNTER — Encounter: Payer: Self-pay | Admitting: Physician Assistant

## 2018-02-18 ENCOUNTER — Ambulatory Visit (INDEPENDENT_AMBULATORY_CARE_PROVIDER_SITE_OTHER): Payer: Medicare Other | Admitting: Physician Assistant

## 2018-02-18 VITALS — BP 160/90 | HR 78 | Temp 98.1°F | Resp 20 | Ht 60.0 in | Wt 206.0 lb

## 2018-02-18 DIAGNOSIS — Z79899 Other long term (current) drug therapy: Secondary | ICD-10-CM | POA: Diagnosis not present

## 2018-02-18 DIAGNOSIS — B370 Candidal stomatitis: Secondary | ICD-10-CM

## 2018-02-18 DIAGNOSIS — M069 Rheumatoid arthritis, unspecified: Secondary | ICD-10-CM

## 2018-02-18 DIAGNOSIS — Z1231 Encounter for screening mammogram for malignant neoplasm of breast: Secondary | ICD-10-CM | POA: Diagnosis not present

## 2018-02-18 DIAGNOSIS — E876 Hypokalemia: Secondary | ICD-10-CM | POA: Diagnosis not present

## 2018-02-18 DIAGNOSIS — J440 Chronic obstructive pulmonary disease with acute lower respiratory infection: Secondary | ICD-10-CM

## 2018-02-18 DIAGNOSIS — Z1239 Encounter for other screening for malignant neoplasm of breast: Secondary | ICD-10-CM

## 2018-02-18 DIAGNOSIS — Z1211 Encounter for screening for malignant neoplasm of colon: Secondary | ICD-10-CM

## 2018-02-18 DIAGNOSIS — I10 Essential (primary) hypertension: Secondary | ICD-10-CM

## 2018-02-18 DIAGNOSIS — J209 Acute bronchitis, unspecified: Secondary | ICD-10-CM | POA: Diagnosis not present

## 2018-02-18 MED ORDER — LOSARTAN POTASSIUM-HCTZ 100-25 MG PO TABS: 1.0000 | ORAL_TABLET | Freq: Every day | ORAL | 0 refills | Status: DC

## 2018-02-18 MED ORDER — PREDNISONE 10 MG PO TABS: ORAL_TABLET | ORAL | 0 refills | Status: DC

## 2018-02-18 MED ORDER — NYSTATIN 100000 UNIT/ML MT SUSP: 5.0000 mL | Freq: Four times a day (QID) | OROMUCOSAL | 0 refills | Status: DC

## 2018-02-18 NOTE — Patient Instructions (Signed)
Hydrochlorothiazide, HCTZ; Losartan tablets What is this medicine? LOSARTAN; HYDROCHLOROTHIAZIDE (loe SAR tan; hye droe klor oh THYE a zide) is a combination of a drug that relaxes blood vessels and a diuretic. It is used to treat high blood pressure. This medicine may also reduce the risk of stroke in certain patients. This medicine may be used for other purposes; ask your health care provider or pharmacist if you have questions. COMMON BRAND NAME(S): Hyzaar What should I tell my health care provider before I take this medicine? They need to know if you have any of these conditions: -decreased urine -kidney disease -liver disease -if you are on a special diet, like a low-salt diet -immune system problems, like lupus -an unusual or allergic reaction to losartan, hydrochlorothiazide, sulfa drugs, other medicines, foods, dyes, or preservatives -pregnant or trying to get pregnant -breast-feeding How should I use this medicine? Take this medicine by mouth with a glass of water. Follow the directions on the prescription label. You can take it with or without food. If it upsets your stomach, take it with food. Take your medicine at regular intervals. Do not take it more often than directed. Do not stop taking except on your doctor's advice. Talk to your pediatrician regarding the use of this medicine in children. Special care may be needed. Overdosage: If you think you have taken too much of this medicine contact a poison control center or emergency room at once. NOTE: This medicine is only for you. Do not share this medicine with others. What if I miss a dose? If you miss a dose, take it as soon as you can. If it is almost time for your next dose, take only that dose. Do not take double or extra doses. What may interact with this medicine? -barbiturates, like phenobarbital -blood pressure medicines -celecoxib -cimetidine -corticosteroids -diabetic medicines -diuretics, especially triamterene,  spironolactone or amiloride -fluconazole -lithium -NSAIDs, medicines for pain and inflammation, like ibuprofen or naproxen -potassium salts or potassium supplements -prescription pain medicines -rifampin -skeletal muscle relaxants like tubocurarine -some cholesterol-lowering medicines like cholestyramine or colestipol This list may not describe all possible interactions. Give your health care provider a list of all the medicines, herbs, non-prescription drugs, or dietary supplements you use. Also tell them if you smoke, drink alcohol, or use illegal drugs. Some items may interact with your medicine. What should I watch for while using this medicine? Check your blood pressure regularly while you are taking this medicine. Ask your doctor or health care professional what your blood pressure should be and when you should contact him or her. When you check your blood pressure, write down the measurements to show your doctor or health care professional. If you are taking this medicine for a long time, you must visit your health care professional for regular checks on your progress. Make sure you schedule appointments on a regular basis. You must not get dehydrated. Ask your doctor or health care professional how much fluid you need to drink a day. Check with him or her if you get an attack of severe diarrhea, nausea and vomiting, or if you sweat a lot. The loss of too much body fluid can make it dangerous for you to take this medicine. Women should inform their doctor if they wish to become pregnant or think they might be pregnant. There is a potential for serious side effects to an unborn child, particularly in the second or third trimester. Talk to your health care professional or pharmacist for more information.   You may get drowsy or dizzy. Do not drive, use machinery, or do anything that needs mental alertness until you know how this drug affects you. Do not stand or sit up quickly, especially if you are  an older patient. This reduces the risk of dizzy or fainting spells. Alcohol can make you more drowsy and dizzy. Avoid alcoholic drinks. This medicine may affect your blood sugar level. If you have diabetes, check with your doctor or health care professional before changing the dose of your diabetic medicine. Avoid salt substitutes unless you are told otherwise by your doctor or health care professional. Do not treat yourself for coughs, colds, or pain while you are taking this medicine without asking your doctor or health care professional for advice. Some ingredients may increase your blood pressure. What side effects may I notice from receiving this medicine? Side effects that you should report to your doctor or health care professional as soon as possible: -allergic reactions like skin rash, itching or hives, swelling of the face, lips, or tongue -breathing problems -changes in vision -dark urine -eye pain -fast or irregular heart beat, palpitations, or chest pain -feeling faint or lightheaded -muscle cramps -persistent dry cough -redness, blistering, peeling or loosening of the skin, including inside the mouth -stomach pain -trouble passing urine or change in the amount of urine -unusual bleeding or bruising -worsened gout pain -yellowing of the eyes or skin Side effects that usually do not require medical attention (report to your doctor or health care professional if they continue or are bothersome): -change in sex drive or performance -headache This list may not describe all possible side effects. Call your doctor for medical advice about side effects. You may report side effects to FDA at 1-800-FDA-1088. Where should I keep my medicine? Keep out of the reach of children. Store at room temperature between 15 and 30 degrees C (59 and 86 degrees F). Protect from light. Keep container tightly closed. Throw away any unused medicine after the expiration date. NOTE: This sheet is a  summary. It may not cover all possible information. If you have questions about this medicine, talk to your doctor, pharmacist, or health care provider.  2018 Elsevier/Gold Standard (2010-05-02 13:57:32)  

## 2018-02-18 NOTE — Progress Notes (Signed)
Patient: Susan Gilmore Female    DOB: [birth year]   52 y.o.   MRN: [id number] Visit Date: 02/18/2018  Today's Provider: [name], PA-C   Chief Complaint  Patient presents with  . COPD  . Cough  . Hypertension   Subjective:    HPI  Follow up for COPD  The patient was last seen for this 2 weeks ago. Changes made at last visit include start prednisone.  She reports excellent compliance with treatment. She feels that condition had Improved up until Sunday.  Patient C/O coughing, wheezing, and shortness of breath since Sunday. Patient reports using albuterol using albuterol 2-3 times daily. Patient reports she has not been able to get Advair due to cost.  ------------------------------------------------------------------------------------   Follow up for Hypertension  Patient was seen on 02/16/18 by [facility] and blood pressure was 150/80. Patient was advised to continue monitoring blood pressure at home. Patient reports that blood pressure has been elevated. Patient did bring in her blood pressure monitor in with her today. Patient reports her "feet feel swollen." ------------------------------------------------------------------------------------     Allergies  Allergen Reactions  . Effexor [Venlafaxine Hcl] Swelling  . Isosorbide Nitrate Other (See Comments)    Reaction: Bad headache     Current Outpatient Medications:  .  albuterol (PROAIR HFA) 108 (90 Base) MCG/ACT inhaler, Inhale 1-2 puffs into the lungs every 6 (six) hours as needed for wheezing or shortness of breath., Disp: 18 g, Rfl: 11 .  ALPRAZolam (XANAX) 1 MG tablet, Take 1 mg by mouth 4 (four) times daily as needed for anxiety. , Disp: , Rfl:  .  amphetamine-dextroamphetamine (ADDERALL XR) 30 MG 24 hr capsule, Take 60 mg by mouth daily., Disp: , Rfl: 0 .  brompheniramine-pseudoephedrine-DM 30-2-10 MG/5ML syrup, Take 5 mLs by mouth 4 (four) times daily as needed., Disp: 120 mL, Rfl: 0 .  DULoxetine  (CYMBALTA) 60 MG capsule, Take 120 mg by mouth daily. , Disp: , Rfl:  .  fluticasone (FLONASE) 50 MCG/ACT nasal spray, Place 2 sprays into both nostrils daily. (Patient taking differently: Place 2 sprays into both nostrils 2 (two) times daily. ), Disp: 16 g, Rfl: 11 .  Investigational - Study Medication, Inject 1 Dose into the muscle every 14 (fourteen) days. Study name: Credo Additional study details: study medication for RA, Disp: , Rfl:  .  ipratropium-albuterol (DUONEB) 0.5-2.5 (3) MG/3ML SOLN, Take 3 mLs by nebulization every 6 (six) hours as needed (shortness of breath)., Disp: 360 mL, Rfl: 5 .  loratadine (CLARITIN) 10 MG tablet, Take 1 tablet by mouth daily., Disp: , Rfl:  .  losartan (COZAAR) 50 MG tablet, Take 1 tablet (50 mg total) by mouth daily., Disp: 30 tablet, Rfl: 5 .  medroxyPROGESTERone (DEPO-PROVERA) 150 MG/ML injection, Inject 1 mL (150 mg total) into the muscle every 3 (three) months., Disp: 1 mL, Rfl: 3 .  montelukast (SINGULAIR) 10 MG tablet, Take 1 tablet by mouth daily., Disp: , Rfl:  .  nystatin (MYCOSTATIN) 100000 UNIT/ML suspension, Take 5 mLs (500,000 Units total) by mouth 4 (four) times daily. (Patient taking differently: Take 5 mLs by mouth 4 (four) times daily. ), Disp: 100 mL, Rfl: 0 .  omeprazole (PRILOSEC) 20 MG capsule, Take 1 capsule (20 mg total) by mouth daily., Disp: 30 capsule, Rfl: 11 .  Oxycodone HCl 10 MG TABS, Take 1 tablet (10 mg total) by mouth every 6 (six) hours as needed. For severe pain, Disp: 180 tablet, Rfl: 0 .  propranolol ER (INDERAL LA) 160 MG SR capsule, Take 1 capsule (160 mg total) by mouth daily. Reported on 12/13/2015, Disp: 90 capsule, Rfl: 3 .  zolpidem (AMBIEN) 10 MG tablet, Take 10 mg by mouth at bedtime. , Disp: , Rfl: 0 .  Fluticasone-Salmeterol (ADVAIR) 250-50 MCG/DOSE AEPB, Inhale 1 puff into the lungs 2 (two) times daily., Disp: , Rfl:   Review of Systems  Constitutional: Positive for diaphoresis and fatigue.  Respiratory:  Positive for cough, shortness of breath and wheezing.   Cardiovascular: Positive for leg swelling.    Social History   Tobacco Use  . Smoking status: Former Smoker    Packs/day: 1.00    Years: 26.00    Pack years: 26.00    Types: Cigarettes    Last attempt to quit: 07/26/2013    Years since quitting: 4.5  . Smokeless tobacco: Never Used  Substance Use Topics  . Alcohol use: No    Alcohol/week: 0.0 oz    Comment: Had a history of alcohol use but currently is not drinking any   Objective:   BP (!) 160/90 (BP Location: Left Arm, Patient Position: Sitting, Cuff Size: Large)   Pulse 78   Temp 98.1 F (36.7 C) (Oral)   Resp 20   Ht 5' (1.524 m)   Wt 206 lb (93.4 kg)   SpO2 99%   BMI 40.23 kg/m  Vitals:   02/18/18 0844  BP: (!) 160/90  Pulse: 78  Resp: 20  Temp: 98.1 F (36.7 C)  TempSrc: Oral  SpO2: 99%  Weight: 206 lb (93.4 kg)  Height: 5' (1.524 m)     Physical Exam  Constitutional: She appears well-developed and well-nourished. No distress.  HENT:  Head: Normocephalic and atraumatic.  Right Ear: Hearing, tympanic membrane, external ear and ear canal normal.  Left Ear: Hearing, tympanic membrane, external ear and ear canal normal.  Nose: Nose normal.  Mouth/Throat: Uvula is midline, oropharynx is clear and moist and mucous membranes are normal. No oropharyngeal exudate.  Eyes: Pupils are equal, round, and reactive to light. Conjunctivae are normal. Right eye exhibits no discharge. Left eye exhibits no discharge. No scleral icterus.  Neck: Normal range of motion. Neck supple. No tracheal deviation present. No thyromegaly present.  Cardiovascular: Normal rate, regular rhythm and normal heart sounds. Exam reveals no gallop and no friction rub.  No murmur heard. Pulmonary/Chest: Effort normal. No stridor. No respiratory distress. She has wheezes (throughout). She has no rales.  Lymphadenopathy:    She has no cervical adenopathy.  Skin: Skin is warm and dry. She is  not diaphoretic.  Vitals reviewed.      Assessment & Plan:     1. Essential hypertension Elevated. Will change losartan 50mg  to losartan-hctz 100-25mg  as below. Continue propanolol ER 160mg . Will check labs as below and f/u pending results. I will see her back in 4 weeks to recheck BP. - losartan-hydrochlorothiazide (HYZAAR) 100-25 MG tablet; Take 1 tablet by mouth daily.  Dispense: 30 tablet; Refill: 0 - CBC w/Diff/Platelet - Comprehensive Metabolic Panel (CMET) - TSH - Lipid Profile  2. Breast cancer screening Declines mammogram.  Continues self breast exams.   3. Colon cancer screening Declines colonoscopy. Not a candidate for cologuard due to h/o colonic polyps.   4. Thrush Secondary to nebulizer use. Stable. Diagnosis pulled for medication refill. Continue current medical treatment plan. - nystatin (MYCOSTATIN) 100000 UNIT/ML suspension; Take 5 mLs (500,000 Units total) by mouth 4 (four) times daily.  Dispense: 100 mL;  Refill: 0  5. Acute bronchitis with COPD ([place]) Worsening. Will give prednisone as below for exacerbation.  - predniSONE (DELTASONE) 10 MG tablet; Take 6 tabs PO on day 1&2, 5 tabs PO on day 3&4, 4 tabs PO on day 5&6, 3 tabs PO on day 7&8, 2 tabs PO on day 9&10, 1 tab PO on day 11&12.  Dispense: 42 tablet; Refill: 0  6. Rheumatoid arthritis involving multiple sites, unspecified rheumatoid factor presence (HCC) High risk medication use secondary to RA. Will check labs as below and f/u pending results. - CBC w/Diff/Platelet - Comprehensive Metabolic Panel (CMET) - TSH - Lipid Profile  7. High risk medication use See above medical treatment plan. - CBC w/Diff/Platelet - Comprehensive Metabolic Panel (CMET) - TSH - Lipid Profile       [name], PA-C  [facility]

## 2018-02-19 ENCOUNTER — Telehealth: Payer: Self-pay | Admitting: *Deleted

## 2018-02-19 LAB — COMPREHENSIVE METABOLIC PANEL
ALT: 33 IU/L — ABNORMAL HIGH (ref 0–32)
AST: 23 IU/L (ref 0–40)
Albumin/Globulin Ratio: 1.9 (ref 1.2–2.2)
Albumin: 3.9 g/dL (ref 3.5–5.5)
Alkaline Phosphatase: 69 IU/L (ref 39–117)
BUN/Creatinine Ratio: 16 (ref 9–23)
BUN: 16 mg/dL (ref 6–24)
Bilirubin Total: 0.5 mg/dL (ref 0.0–1.2)
CO2: 21 mmol/L (ref 20–29)
Calcium: 8.8 mg/dL (ref 8.7–10.2)
Chloride: 105 mmol/L (ref 96–106)
Creatinine, Ser: 0.99 mg/dL (ref 0.57–1.00)
GFR calc Af Amer: 77 mL/min/{1.73_m2} (ref >59)
GFR calc non Af Amer: 67 mL/min/{1.73_m2} (ref >59)
Globulin, Total: 2.1 g/dL (ref 1.5–4.5)
Glucose: 86 mg/dL (ref 65–99)
Potassium: 3 mmol/L — ABNORMAL LOW (ref 3.5–5.2)
Sodium: 142 mmol/L (ref 134–144)
Total Protein: 6 g/dL (ref 6.0–8.5)

## 2018-02-19 LAB — CBC WITH DIFFERENTIAL/PLATELET
Basophils Absolute: 0.1 10*3/uL (ref 0.0–0.2)
Basos: 1 %
EOS (ABSOLUTE): 1.2 10*3/uL — ABNORMAL HIGH (ref 0.0–0.4)
Eos: 10 %
Hematocrit: 43.9 % (ref 34.0–46.6)
Hemoglobin: 14.8 g/dL (ref 11.1–15.9)
Immature Grans (Abs): 0.1 10*3/uL (ref 0.0–0.1)
Immature Granulocytes: 1 %
Lymphocytes Absolute: 1.7 10*3/uL (ref 0.7–3.1)
Lymphs: 14 %
MCH: 31.4 pg (ref 26.6–33.0)
MCHC: 33.7 g/dL (ref 31.5–35.7)
MCV: 93 fL (ref 79–97)
Monocytes Absolute: 0.9 10*3/uL (ref 0.1–0.9)
Monocytes: 7 %
Neutrophils Absolute: 8.1 10*3/uL — ABNORMAL HIGH (ref 1.4–7.0)
Neutrophils: 67 %
Platelets: 197 10*3/uL (ref 150–450)
RBC: 4.72 x10E6/uL (ref 3.77–5.28)
RDW: 13.8 % (ref 12.3–15.4)
WBC: 12.1 10*3/uL — ABNORMAL HIGH (ref 3.4–10.8)

## 2018-02-19 LAB — LIPID PANEL
Chol/HDL Ratio: 3.3 ratio (ref 0.0–4.4)
Cholesterol, Total: 165 mg/dL (ref 100–199)
HDL: 50 mg/dL (ref >39)
LDL Calculated: 85 mg/dL (ref 0–99)
Triglycerides: 148 mg/dL (ref 0–149)
VLDL Cholesterol Cal: 30 mg/dL (ref 5–40)

## 2018-02-19 LAB — TSH: TSH: 2.62 u[IU]/mL (ref 0.450–4.500)

## 2018-02-19 MED ORDER — POTASSIUM CHLORIDE ER 10 MEQ PO TBCR: 10.0000 meq | EXTENDED_RELEASE_TABLET | Freq: Every day | ORAL | 5 refills | Status: DC

## 2018-02-19 NOTE — Telephone Encounter (Signed)
LMOVM for pt to return call 

## 2018-02-19 NOTE — Addendum Note (Signed)
Addended by: [name] on: 02/19/2018 01:28 PM   Modules accepted: Orders

## 2018-02-19 NOTE — Telephone Encounter (Signed)
-----   Message from [name], PA-C sent at 02/19/2018  1:27 PM EDT ----- WBC count is up which is consistent with current infection. Potassium low. I will send in replacement for you to start. Thyroid normal. Kidney and liver function normal. Cholesterol normal.

## 2018-02-19 NOTE — Telephone Encounter (Signed)
Patient advised as directed below.  Thanks,  -Joseline 

## 2018-02-19 NOTE — Telephone Encounter (Signed)
Pt returned call ° °teri °

## 2018-03-11 ENCOUNTER — Telehealth: Payer: Self-pay

## 2018-03-11 NOTE — Telephone Encounter (Signed)
Made an appt with pt.    tp

## 2018-03-11 NOTE — Telephone Encounter (Signed)
LMTCB-Per Jenni patient needs to schedule an appointment with her to talk about some studies. Jenni request for her appointment time to be 40 minutes.

## 2018-03-12 ENCOUNTER — Other Ambulatory Visit: Payer: Self-pay | Admitting: Physician Assistant

## 2018-03-12 ENCOUNTER — Encounter: Payer: Self-pay | Admitting: Physician Assistant

## 2018-03-12 ENCOUNTER — Ambulatory Visit: Payer: Medicare Other | Admitting: Physician Assistant

## 2018-03-12 VITALS — BP 160/88 | HR 100 | Temp 98.1°F | Resp 16 | Wt 204.0 lb

## 2018-03-12 DIAGNOSIS — I10 Essential (primary) hypertension: Secondary | ICD-10-CM

## 2018-03-12 DIAGNOSIS — Z8249 Family history of ischemic heart disease and other diseases of the circulatory system: Secondary | ICD-10-CM

## 2018-03-12 DIAGNOSIS — G8929 Other chronic pain: Secondary | ICD-10-CM | POA: Diagnosis not present

## 2018-03-12 DIAGNOSIS — M069 Rheumatoid arthritis, unspecified: Secondary | ICD-10-CM

## 2018-03-12 DIAGNOSIS — R7989 Other specified abnormal findings of blood chemistry: Secondary | ICD-10-CM | POA: Diagnosis not present

## 2018-03-12 DIAGNOSIS — R9431 Abnormal electrocardiogram [ECG] [EKG]: Secondary | ICD-10-CM | POA: Diagnosis not present

## 2018-03-12 MED ORDER — OXYCODONE HCL 10 MG PO TABS: 10.0000 mg | ORAL_TABLET | Freq: Four times a day (QID) | ORAL | 0 refills | Status: DC | PRN

## 2018-03-12 NOTE — Progress Notes (Signed)
Patient: Susan Gilmore Female    DOB: 16-Nov-1965   52 y.o.   MRN: [id number] Visit Date: 03/12/2018  Today's Provider: [name], PA-C   Chief Complaint  Patient presents with  . Follow-up    Studies   Subjective:    HPI Susan Gilmore is a 52 yr old female that is participating in an investigational study for rheumatoid arthritis. A letter was received from one of the investigators indicating that labs from February 05, 2018 and was noted to have an increased BNP 219.5 pg/mL and a pro-BNP of 677pg/mL. Previous labs on July 28, 2017 and February 26, 2016 were normal.  She denies any current chest pain, worsening SOB, worsening DOE, no lower extremity edema. She does report nighttime palpitations when she is lying down, however, patient also has anxiety disorder and ADHD followed by Dr. Toy Care.   She does report a strong family history of cardiovascular disease with her mother passing at 30 due to a heart attack (mother also had diabetes). Father passed from a stroke and also had cardiovascular disease. Has an aunt with a defibrillator.   She personally had a cardiac cath done on 10/18/10 for chest pain by Dr. [name] that was normal. She has also had a CXR on 06/12/17 for a positive TB skin test that was unremarkable and showed no cardiomyopathy. She has also had EKGs that are abnormal, showing LVH most likely secondary to poorly controlled HTN, indeterminate ST changes (cannot r/o septal infarct) and prolonged QT. These changes have been consistent and unchanged on subsequent EKGs.     Allergies  Allergen Reactions  . Effexor [Venlafaxine Hcl] Swelling  . Isosorbide Nitrate Other (See Comments)    Reaction: Bad headache     Current Outpatient Medications:  .  albuterol (PROAIR HFA) 108 (90 Base) MCG/ACT inhaler, Inhale 1-2 puffs into the lungs every 6 (six) hours as needed for wheezing or shortness of breath., Disp: 18 g, Rfl: 11 .  ALPRAZolam (XANAX) 1 MG tablet, Take 1  mg by mouth 4 (four) times daily as needed for anxiety. , Disp: , Rfl:  .  amphetamine-dextroamphetamine (ADDERALL XR) 30 MG 24 hr capsule, Take 60 mg by mouth daily., Disp: , Rfl: 0 .  DULoxetine (CYMBALTA) 60 MG capsule, Take 120 mg by mouth daily. , Disp: , Rfl:  .  fluticasone (FLONASE) 50 MCG/ACT nasal spray, Place 2 sprays into both nostrils daily. (Patient taking differently: Place 2 sprays into both nostrils 2 (two) times daily. ), Disp: 16 g, Rfl: 11 .  Fluticasone-Salmeterol (ADVAIR) 250-50 MCG/DOSE AEPB, Inhale 1 puff into the lungs 2 (two) times daily., Disp: , Rfl:  .  ipratropium-albuterol (DUONEB) 0.5-2.5 (3) MG/3ML SOLN, Take 3 mLs by nebulization every 6 (six) hours as needed (shortness of breath)., Disp: 360 mL, Rfl: 5 .  loratadine (CLARITIN) 10 MG tablet, Take 1 tablet by mouth daily., Disp: , Rfl:  .  losartan-hydrochlorothiazide (HYZAAR) 100-25 MG tablet, Take 1 tablet by mouth daily., Disp: 30 tablet, Rfl: 0 .  medroxyPROGESTERone (DEPO-PROVERA) 150 MG/ML injection, Inject 1 mL (150 mg total) into the muscle every 3 (three) months., Disp: 1 mL, Rfl: 3 .  montelukast (SINGULAIR) 10 MG tablet, Take 1 tablet by mouth daily., Disp: , Rfl:  .  omeprazole (PRILOSEC) 20 MG capsule, Take 1 capsule (20 mg total) by mouth daily., Disp: 30 capsule, Rfl: 11 .  Oxycodone HCl 10 MG TABS, Take 1 tablet (10 mg total) by  mouth every 6 (six) hours as needed. For severe pain, Disp: 180 tablet, Rfl: 0 .  potassium chloride (K-DUR) 10 MEQ tablet, Take 1 tablet (10 mEq total) by mouth daily., Disp: 30 tablet, Rfl: 5 .  propranolol ER (INDERAL LA) 160 MG SR capsule, Take 1 capsule (160 mg total) by mouth daily. Reported on 12/13/2015, Disp: 90 capsule, Rfl: 3 .  zolpidem (AMBIEN) 10 MG tablet, Take 10 mg by mouth at bedtime. , Disp: , Rfl: 0 .  brompheniramine-pseudoephedrine-DM 30-2-10 MG/5ML syrup, Take 5 mLs by mouth 4 (four) times daily as needed. (Patient not taking: Reported on 03/12/2018), Disp:  120 mL, Rfl: 0 .  Investigational - Study Medication, Inject 1 Dose into the muscle every 14 (fourteen) days. Study name: Credo Additional study details: study medication for RA, Disp: , Rfl:  .  nystatin (MYCOSTATIN) 100000 UNIT/ML suspension, Take 5 mLs (500,000 Units total) by mouth 4 (four) times daily. (Patient not taking: Reported on 03/12/2018), Disp: 100 mL, Rfl: 0 .  predniSONE (DELTASONE) 10 MG tablet, Take 6 tabs PO on day 1&2, 5 tabs PO on day 3&4, 4 tabs PO on day 5&6, 3 tabs PO on day 7&8, 2 tabs PO on day 9&10, 1 tab PO on day 11&12. (Patient not taking: Reported on 03/12/2018), Disp: 42 tablet, Rfl: 0  Review of Systems  Constitutional: Positive for fatigue ("sometimes").  Respiratory: Positive for shortness of breath. Negative for cough and chest tightness.   Cardiovascular: Positive for palpitations (off and on). Negative for chest pain and leg swelling.  Gastrointestinal: Negative.   Musculoskeletal: Positive for arthralgias.  Neurological: Positive for headaches.    Social History   Tobacco Use  . Smoking status: Former Smoker    Packs/day: 1.00    Years: 26.00    Pack years: 26.00    Types: Cigarettes    Last attempt to quit: 07/26/2013    Years since quitting: 4.6  . Smokeless tobacco: Never Used  Substance Use Topics  . Alcohol use: No    Alcohol/week: 0.0 oz    Comment: Had a history of alcohol use but currently is not drinking any   Objective:   BP (!) 160/88 (BP Location: Left Arm, Patient Position: Sitting, Cuff Size: Large)   Pulse 100   Temp 98.1 F (36.7 C) (Oral)   Resp 16   Wt 204 lb (92.5 kg)   SpO2 99%   BMI 39.84 kg/m  Vitals:   03/12/18 0810  BP: (!) 160/88  Pulse: 100  Resp: 16  Temp: 98.1 F (36.7 C)  TempSrc: Oral  SpO2: 99%  Weight: 204 lb (92.5 kg)     Physical Exam  Constitutional: She appears well-developed and well-nourished. No distress.  Neck: Normal range of motion. Neck supple.  Cardiovascular: Normal rate, regular  rhythm and normal heart sounds. Exam reveals no gallop and no friction rub.  No murmur heard. Pulmonary/Chest: Effort normal and breath sounds normal. No respiratory distress. She has no wheezes. She has no rales.  Musculoskeletal: She exhibits no edema.  Skin: She is not diaphoretic.  Vitals reviewed.      Assessment & Plan:     1. Elevated brain natriuretic peptide (BNP) level Patient request Dr. Dema Severin in [place]. Referral will be placed as below. Patient is asymptomatic at this time.  - Ambulatory referral to Cardiology  2. Abnormal EKG See above medical treatment plan. - Ambulatory referral to Cardiology  3. Family history of cardiovascular disease Mother passed at 48 from  MI.  - Ambulatory referral to Cardiology  4. Rheumatoid arthritis involving multiple sites, unspecified rheumatoid factor presence (HCC) Stable. Diagnosis pulled for medication refill. Continue current medical treatment plan. - Oxycodone HCl 10 MG TABS; Take 1 tablet (10 mg total) by mouth every 6 (six) hours as needed. For severe pain  Dispense: 120 tablet; Refill: 0  5. Encounter for chronic pain management Stable. Diagnosis pulled for medication refill. Continue current medical treatment plan. - Oxycodone HCl 10 MG TABS; Take 1 tablet (10 mg total) by mouth every 6 (six) hours as needed. For severe pain  Dispense: 120 tablet; Refill: 0  6. Essential hypertension Elevated today but improving. Patient reports at home readings are better. Continue propanolol ER 160mg  daily and Losartan-HCTZ 100-25mg  daily.        [name], PA-C  [facility]

## 2018-03-18 ENCOUNTER — Ambulatory Visit: Payer: Self-pay | Admitting: Physician Assistant

## 2018-03-27 ENCOUNTER — Other Ambulatory Visit: Payer: Self-pay | Admitting: Physician Assistant

## 2018-03-27 DIAGNOSIS — I10 Essential (primary) hypertension: Secondary | ICD-10-CM

## 2018-03-27 MED ORDER — LOSARTAN POTASSIUM-HCTZ 100-25 MG PO TABS: 1.0000 | ORAL_TABLET | Freq: Every day | ORAL | 1 refills | Status: DC

## 2018-03-27 NOTE — Telephone Encounter (Signed)
Patient needs a refill on Losartan HCTZ called to [facility] in [place].

## 2018-04-08 ENCOUNTER — Other Ambulatory Visit: Payer: Self-pay | Admitting: Physician Assistant

## 2018-04-08 DIAGNOSIS — M069 Rheumatoid arthritis, unspecified: Secondary | ICD-10-CM

## 2018-04-08 DIAGNOSIS — G8929 Other chronic pain: Secondary | ICD-10-CM

## 2018-04-08 MED ORDER — OXYCODONE HCL 10 MG PO TABS: 10.0000 mg | ORAL_TABLET | Freq: Four times a day (QID) | ORAL | 0 refills | Status: DC | PRN

## 2018-04-08 NOTE — Telephone Encounter (Signed)
Pt contacted office for refill request on the following medications:  Oxycodone HCl 10 MG TABS  [facility] [address]  Pt is requesting to be able to pick up Rx today if possible because they are leaving tonight to go out of town until next week. Pt is requesting that we put on the Rx ok for pharmacy to fill early.  Last Rx: 03/12/18 Please advise. Thanks TNP

## 2018-04-10 ENCOUNTER — Other Ambulatory Visit: Payer: Self-pay | Admitting: Physician Assistant

## 2018-04-10 DIAGNOSIS — Z3042 Encounter for surveillance of injectable contraceptive: Secondary | ICD-10-CM

## 2018-04-13 ENCOUNTER — Ambulatory Visit (INDEPENDENT_AMBULATORY_CARE_PROVIDER_SITE_OTHER): Payer: Medicare Other | Admitting: Physician Assistant

## 2018-04-13 DIAGNOSIS — Z3042 Encounter for surveillance of injectable contraceptive: Secondary | ICD-10-CM | POA: Diagnosis not present

## 2018-04-13 MED ORDER — MEDROXYPROGESTERONE ACETATE 150 MG/ML IM SUSP
150.0000 mg | Freq: Once | INTRAMUSCULAR | Status: AC
  Administered 2018-04-13: 150 mg via INTRAMUSCULAR

## 2018-04-13 NOTE — Progress Notes (Signed)
Patient: Susan Gilmore Female    DOB: 1966/02/05   52 y.o.   MRN: [id number] Visit Date: 04/13/2018  Today's Provider: [name], PA-C   Chief Complaint  Patient presents with  . Contraception   Subjective:    HPI Patient here today for Depo-Provera for contraception. Patient reports good tolerance and denies any abnormal side effects. Patient next due date Nov. 4-Nov. 18.    Allergies  Allergen Reactions  . Effexor [Venlafaxine Hcl] Swelling  . Isosorbide Nitrate Other (See Comments)    Reaction: Bad headache     Current Outpatient Medications:  .  albuterol (PROAIR HFA) 108 (90 Base) MCG/ACT inhaler, Inhale 1-2 puffs into the lungs every 6 (six) hours as needed for wheezing or shortness of breath., Disp: 18 g, Rfl: 11 .  ALPRAZolam (XANAX) 1 MG tablet, Take 1 mg by mouth 4 (four) times daily as needed for anxiety. , Disp: , Rfl:  .  amphetamine-dextroamphetamine (ADDERALL XR) 30 MG 24 hr capsule, Take 60 mg by mouth daily., Disp: , Rfl: 0 .  brompheniramine-pseudoephedrine-DM 30-2-10 MG/5ML syrup, Take 5 mLs by mouth 4 (four) times daily as needed. (Patient not taking: Reported on 03/12/2018), Disp: 120 mL, Rfl: 0 .  DULoxetine (CYMBALTA) 60 MG capsule, Take 120 mg by mouth daily. , Disp: , Rfl:  .  fluticasone (FLONASE) 50 MCG/ACT nasal spray, Place 2 sprays into both nostrils daily. (Patient taking differently: Place 2 sprays into both nostrils 2 (two) times daily. ), Disp: 16 g, Rfl: 11 .  Fluticasone-Salmeterol (ADVAIR) 250-50 MCG/DOSE AEPB, Inhale 1 puff into the lungs 2 (two) times daily., Disp: , Rfl:  .  Investigational - Study Medication, Inject 1 Dose into the muscle every 14 (fourteen) days. Study name: Credo Additional study details: study medication for RA, Disp: , Rfl:  .  ipratropium-albuterol (DUONEB) 0.5-2.5 (3) MG/3ML SOLN, Take 3 mLs by nebulization every 6 (six) hours as needed (shortness of breath)., Disp: 360 mL, Rfl: 5 .  loratadine  (CLARITIN) 10 MG tablet, Take 1 tablet by mouth daily., Disp: , Rfl:  .  losartan-hydrochlorothiazide (HYZAAR) 100-25 MG tablet, Take 1 tablet by mouth daily., Disp: 90 tablet, Rfl: 1 .  medroxyPROGESTERone Acetate 150 MG/ML SUSY, INJECT 1 ML (150 MG TOTAL) INTO THE MUSCLE EVERY 3 (THREE) MONTHS., Disp: 1 Syringe, Rfl: 3 .  montelukast (SINGULAIR) 10 MG tablet, Take 1 tablet by mouth daily., Disp: , Rfl:  .  nystatin (MYCOSTATIN) 100000 UNIT/ML suspension, Take 5 mLs (500,000 Units total) by mouth 4 (four) times daily. (Patient not taking: Reported on 03/12/2018), Disp: 100 mL, Rfl: 0 .  omeprazole (PRILOSEC) 20 MG capsule, Take 1 capsule (20 mg total) by mouth daily., Disp: 30 capsule, Rfl: 11 .  Oxycodone HCl 10 MG TABS, Take 1 tablet (10 mg total) by mouth every 6 (six) hours as needed. For severe pain, Disp: 120 tablet, Rfl: 0 .  potassium chloride (K-DUR) 10 MEQ tablet, Take 1 tablet (10 mEq total) by mouth daily., Disp: 30 tablet, Rfl: 5 .  predniSONE (DELTASONE) 10 MG tablet, Take 6 tabs PO on day 1&2, 5 tabs PO on day 3&4, 4 tabs PO on day 5&6, 3 tabs PO on day 7&8, 2 tabs PO on day 9&10, 1 tab PO on day 11&12. (Patient not taking: Reported on 03/12/2018), Disp: 42 tablet, Rfl: 0 .  propranolol ER (INDERAL LA) 160 MG SR capsule, Take 1 capsule (160 mg total) by mouth daily. Reported  on 12/13/2015, Disp: 90 capsule, Rfl: 3 .  zolpidem (AMBIEN) 10 MG tablet, Take 10 mg by mouth at bedtime. , Disp: , Rfl: 0  Review of Systems  Social History   Tobacco Use  . Smoking status: Former Smoker    Packs/day: 1.00    Years: 26.00    Pack years: 26.00    Types: Cigarettes    Last attempt to quit: 07/26/2013    Years since quitting: 4.7  . Smokeless tobacco: Never Used  Substance Use Topics  . Alcohol use: No    Alcohol/week: 0.0 standard drinks    Comment: Had a history of alcohol use but currently is not drinking any   Objective:   There were no vitals taken for this visit. There were no  vitals filed for this visit.   Physical Exam      Assessment & Plan:           [name], PA-C  [facility]

## 2018-04-24 ENCOUNTER — Encounter: Payer: Self-pay | Admitting: Cardiology

## 2018-04-24 ENCOUNTER — Ambulatory Visit: Payer: Medicare Other | Admitting: Cardiology

## 2018-04-24 VITALS — BP 142/82 | HR 91 | Ht 60.0 in | Wt 210.0 lb

## 2018-04-24 DIAGNOSIS — Z01812 Encounter for preprocedural laboratory examination: Secondary | ICD-10-CM

## 2018-04-24 DIAGNOSIS — Z7189 Other specified counseling: Secondary | ICD-10-CM

## 2018-04-24 DIAGNOSIS — R9431 Abnormal electrocardiogram [ECG] [EKG]: Secondary | ICD-10-CM | POA: Diagnosis not present

## 2018-04-24 DIAGNOSIS — R079 Chest pain, unspecified: Secondary | ICD-10-CM | POA: Diagnosis not present

## 2018-04-24 DIAGNOSIS — Z79899 Other long term (current) drug therapy: Secondary | ICD-10-CM

## 2018-04-24 DIAGNOSIS — R0602 Shortness of breath: Secondary | ICD-10-CM

## 2018-04-24 DIAGNOSIS — R0683 Snoring: Secondary | ICD-10-CM

## 2018-04-24 DIAGNOSIS — Z6841 Body Mass Index (BMI) 40.0 and over, adult: Secondary | ICD-10-CM

## 2018-04-24 DIAGNOSIS — I10 Essential (primary) hypertension: Secondary | ICD-10-CM

## 2018-04-24 DIAGNOSIS — E66813 Obesity, class 3: Secondary | ICD-10-CM

## 2018-04-24 MED ORDER — METOPROLOL TARTRATE 50 MG PO TABS: ORAL_TABLET | ORAL | 0 refills | Status: DC

## 2018-04-24 MED ORDER — AMLODIPINE BESYLATE 5 MG PO TABS: 5.0000 mg | ORAL_TABLET | Freq: Every day | ORAL | 3 refills | Status: DC

## 2018-04-24 NOTE — Patient Instructions (Addendum)
Medication Instructions: Your physician recommends that you make the following changes to your medication. Start: Amlodipine 5 mg  If you need a refill on your cardiac medications before your next appointment, please call your pharmacy.   Labwork: Your physician recommends that you return for lab work one week prior to Coronary CTA (BMP)   Procedures/Testing: Your physician has requested that you have an echocardiogram. Echocardiography is a painless test that uses sound waves to create images of your heart. It provides your doctor with information about the size and shape of your heart and how well your heart's chambers and valves are working. This procedure takes approximately one hour. There are no restrictions for this procedure. [facility] has recommended that you have a sleep study. This test records several body functions during sleep, including: brain activity, eye movement, oxygen and carbon dioxide blood levels, heart rate and rhythm, breathing rate and rhythm, the flow of air through your mouth and nose, snoring, body muscle movements, and chest and belly movement. [facility]  Your physician has requested that you have cardiac CT. Cardiac computed tomography (CT) is a painless test that uses an x-ray machine to take clear, detailed pictures of your heart. For further information please visit [email or web address]. Please follow instruction sheet as given. [facility]     Follow-Up: Your physician wants you to follow-up in 3 months with Dr. Harrell Gave.  Special Instructions:  Please arrive at the [facility] main entrance of [facility] at xx:xx AM (30-45 minutes prior to test start time)  [facility] [address], [place] [address] [phone]  Proceed to the [facility] Radiology Department (First Floor).  Please follow these instructions carefully (unless otherwise  directed):    On the Night Before the Test: . Drink plenty of water. . Do not consume any caffeinated/decaffeinated beverages or chocolate 12 hours prior to your test. . Do not take any antihistamines 12 hours prior to your test. . If you take Metformin do not take 24 hours prior to test.   On the Day of the Test: . Drink plenty of water. Do not drink any water within one hour of the test. . Do not eat any food 4 hours prior to the test. . You may take your regular medications prior to the test. . IF NOT ON A BETA BLOCKER - Take 50 mg of lopressor (metoprolol) one hour before the test.   After the Test: . Drink plenty of water. . After receiving IV contrast, you may experience a mild flushed feeling. This is normal. . On occasion, you may experience a mild rash up to 24 hours after the test. This is not dangerous. If this occurs, you can take Benadryl 25 mg and increase your fluid intake. . If you experience trouble breathing, this can be serious. If it is severe call 911 IMMEDIATELY. If it is mild, please call our office.     Thank you for choosing [facility]!!

## 2018-04-24 NOTE — Progress Notes (Signed)
Cardiology Office Note:    Date:  04/24/2018   ID:  Susan Gilmore, DOB Jan 01, 1966, MRN [id number]  PCP:  [name], PA-C  Cardiologist:  [name], MD PhD  Referring MD: [name]*   Chief Complaint  Patient presents with  . New Patient (Initial Visit)    PCP advised patients heart was not functioning well, and getting weaker. pt states occasional chest pains, mentions SOB, mentions swelling in hands/feet    History of Present Illness:    Susan Gilmore is a 52 y.o. female with a hx of rheumatoid arthritis, COPD who is seen as a new consult at the request of Rheta, Hemmelgarn, P* for the evaluation and management of elevated BNP.  Patient concerns: was told she has blood standing still around her heart, her heart was getting weaker. She has COPD, but a rheumatologist told her that her heart may also be making her breathing short. She denies getting anything other than bloodwork so far (no echo).   She notes past chest discomfort that sometimes goes to her neck. Had only happened a few times. She tenses up until it's gone, then it goes away. Non exertional, not really dull but not sharp. Feels like it grabs her. Lasts a minute. None recently, last over a year ago at least.   She has another pain, midsternal to the right chest, sharp pain, very brief. Occurs several times/month. Has shortness of breath, nausea, diaphoresis with it. It is associated with stress, not exertion. Goes away on its own. She thinks it is a panic attack.  Shortness of breath: cannot walk very far without her breathing getting bad. Can climb a flight of stairs but it wipes her out. Has been getting worse. Is on inhaler and nebulizer for COPD. No longer smokes, but does vape. Was smoking 203 ppd before, quit 2-3 years ago.Sleeps in a recliner because of her breathing. When she lays flat, feels like she can't breath. Does have PND when she sleeps in the bed. Never had a sleep  study. Snores. Son says she stops breathing for a bit when she sleeps.   Has daily mild swelling in hands and feet. Worse when her rheumatoid arthritis flares. Worse at night also.  Cardiac issues: Was seen by Dr. [name] in 2012, reports having a normal cath (I can see the Ao pressures but not the anatomy on the uploaded document). She also saw Dr. [name] in 2013.  She reports a history of bigeminy (found in 2012-2013 incidentally). Rare issue for her, notices it when she lays down to sleep. Computer notes atrial fib/flutter in the past (she is not aware of this diagnosis, never on blood thinners). I personally reviewed all of her ECGs in the system and cannot find evidence of this.   Prior studies here included cath and stress test in 2012.  Hypertension: on losartan-HCTZ, propranolol. Not taken the propranolol in several months (since starting the losartan-HCTZ). Get headaches 1-2/week, occasional blurry vision. Was on metoprolol in the past. She is not sure why this was chosen, but not sure if it has affected her COPD. Checks her blood pressure at home intermittently, notes that it has been better since starting the HCTZ-losartan. Distantly she had SBP >200, but none in at least 6-8 months. Thinks she has seen more 140-150 SBP recently.  She denies syncope. Denies any other heart procedures other than the stress and cath in 2012.  Past Medical History:  Diagnosis Date  .  Abnormal Pap smear 04/28/2012   normal pap /positive hpv   . Abnormal stress test   . Allergic rhinitis   . Bipolar disorder ([place])   . Carpal tunnel syndrome   . Chest pain   . Chronic anxiety   . Contraception   . Cough   . HPV (human papilloma virus) anogenital infection   . HTN (hypertension)   . Hyperplastic colon polyp   . Insomnia   . Palpitations   . Pneumonia     Past Surgical History:  Procedure Laterality Date  . full mouth dental    . GANGLION CYST EXCISION    . WISDOM TOOTH EXTRACTION       Current Medications: Current Outpatient Medications on File Prior to Visit  Medication Sig  . albuterol (PROAIR HFA) 108 (90 Base) MCG/ACT inhaler Inhale 1-2 puffs into the lungs every 6 (six) hours as needed for wheezing or shortness of breath.  . ALPRAZolam (XANAX) 1 MG tablet Take 1 mg by mouth 4 (four) times daily as needed for anxiety.   [name] amphetamine-dextroamphetamine (ADDERALL XR) 30 MG 24 hr capsule Take 60 mg by mouth daily.  . DULoxetine (CYMBALTA) 60 MG capsule Take 120 mg by mouth daily.   . fluticasone (FLONASE) 50 MCG/ACT nasal spray Place 2 sprays into both nostrils daily. (Patient taking differently: Place 2 sprays into both nostrils 2 (two) times daily. )  . Fluticasone-Salmeterol (ADVAIR) 250-50 MCG/DOSE AEPB Inhale 1 puff into the lungs 2 (two) times daily.  [name] ipratropium-albuterol (DUONEB) 0.5-2.5 (3) MG/3ML SOLN Take 3 mLs by nebulization every 6 (six) hours as needed (shortness of breath).  . loratadine (CLARITIN) 10 MG tablet Take 1 tablet by mouth daily.  [name] losartan-hydrochlorothiazide (HYZAAR) 100-25 MG tablet Take 1 tablet by mouth daily.  . montelukast (SINGULAIR) 10 MG tablet Take 1 tablet by mouth daily.  [name] omeprazole (PRILOSEC) 20 MG capsule Take 1 capsule (20 mg total) by mouth daily.  . Oxycodone HCl 10 MG TABS Take 1 tablet (10 mg total) by mouth every 6 (six) hours as needed. For severe pain  . potassium chloride (K-DUR) 10 MEQ tablet Take 1 tablet (10 mEq total) by mouth daily.  . propranolol ER (INDERAL LA) 160 MG SR capsule Take 1 capsule (160 mg total) by mouth daily. Reported on 12/13/2015  . zolpidem (AMBIEN) 10 MG tablet Take 10 mg by mouth at bedtime.   . brompheniramine-pseudoephedrine-DM 30-2-10 MG/5ML syrup Take 5 mLs by mouth 4 (four) times daily as needed. (Patient not taking: Reported on 04/24/2018)  . Investigational - Study Medication Inject 1 Dose into the muscle every 14 (fourteen) days. Study name: [facility] Additional study details: study  medication for RA  . medroxyPROGESTERone Acetate 150 MG/ML SUSY INJECT 1 ML (150 MG TOTAL) INTO THE MUSCLE EVERY 3 (THREE) MONTHS. (Patient not taking: Reported on 04/24/2018)  . nystatin (MYCOSTATIN) 100000 UNIT/ML suspension Take 5 mLs (500,000 Units total) by mouth 4 (four) times daily. (Patient not taking: Reported on 04/24/2018)   No current facility-administered medications on file prior to visit.      Allergies:   Effexor [venlafaxine hcl] and Isosorbide nitrate   Social History   Socioeconomic History  . Marital status: Married    Spouse name: Not on file  . Number of children: 1  . Years of education: Not on file  . Highest education level: 10th grade  Occupational History  . Occupation: disability  Social Needs  . Financial resource strain: Somewhat hard  . Food  insecurity:    Worry: Never true    Inability: Never true  . Transportation needs:    Medical: No    Non-medical: No  Tobacco Use  . Smoking status: Former Smoker    Packs/day: 1.00    Years: 26.00    Pack years: 26.00    Types: Cigarettes    Last attempt to quit: 07/26/2013    Years since quitting: 4.7  . Smokeless tobacco: Never Used  Substance and Sexual Activity  . Alcohol use: No    Alcohol/week: 0.0 standard drinks    Comment: Had a history of alcohol use but currently is not drinking any  . Drug use: No  . Sexual activity: Yes    Birth control/protection: Injection    Comment: depo  Lifestyle  . Physical activity:    Days per week: Not on file    Minutes per session: Not on file  . Stress: Only a little  Relationships  . Social connections:    Talks on phone: Not on file    Gets together: Not on file    Attends religious service: Not on file    Active member of club or organization: Not on file    Attends meetings of clubs or organizations: Not on file    Relationship status: Not on file  Other Topics Concern  . Not on file  Social History Narrative   The patient is married. She has  one stepson. She does not work outside the home. She smokes 1-1/2-2 packs of cigarettes daily.     Family History: The patient's family history includes Diabetes in her maternal grandmother and mother; Heart attack in her mother; Heart disease in her father, maternal grandfather, maternal grandmother, and paternal grandmother; Hyperlipidemia in her father; Hypertension in her father, maternal grandfather, and maternal grandmother; Stroke in her father and paternal grandmother.  ROS:   Please see the history of present illness.  Additional pertinent ROS: Review of Systems  Constitutional: Positive for diaphoresis. Negative for chills and fever.  HENT: Negative for ear pain and hearing loss.   Eyes: Negative for blurred vision and pain.  Respiratory: Positive for cough and shortness of breath. Negative for hemoptysis.   Cardiovascular: Positive for chest pain, palpitations, orthopnea, leg swelling and PND. Negative for claudication.  Gastrointestinal: Negative for abdominal pain, blood in stool and melena.  Genitourinary: Negative for dysuria and hematuria.  Musculoskeletal: Positive for back pain, joint pain and myalgias. Negative for falls.  Skin: Negative for rash.  Neurological: Positive for headaches. Negative for focal weakness and loss of consciousness.  Endo/Heme/Allergies: Does not bruise/bleed easily.  Psychiatric/Behavioral: The patient is nervous/anxious and has insomnia.      EKGs/Labs/Other Studies Reviewed:    The following studies were reviewed today: All prior ECGs in system, prior cardiology notes, recent notes in system. Unable to see anatomy from cath in 2012.  EKG:  EKG is ordered today.  The ekg ordered today demonstrates normal sinus rhythm with significant (though unchanged) ST changes. Diffuse ST depressions and T wave inversions throughout, with elevation in aVR. Borderline anteroseptal infarct pattern. This has been unchanged since 2017.  Recent  Labs: 02/18/2018: ALT 33; BUN 16; Creatinine, Ser 0.99; Hemoglobin 14.8; Platelets 197; Potassium 3.0; Sodium 142; TSH 2.620  Recent Lipid Panel    Component Value Date/Time   CHOL 165 02/18/2018 0930   CHOL 166 06/22/2014 1033   TRIG 148 02/18/2018 0930   TRIG 175 06/22/2014 1033   HDL 50 02/18/2018 0930  HDL 39 (L) 06/22/2014 1033   CHOLHDL 3.3 02/18/2018 0930   VLDL 35 06/22/2014 1033   LDLCALC 85 02/18/2018 0930   LDLCALC 92 06/22/2014 1033    Physical Exam:    VS:  BP (!) 142/82 (BP Location: Right Arm, Patient Position: Sitting)   Pulse 91   Ht 5' (1.524 m)   Wt 210 lb (95.3 kg)   BMI 41.01 kg/m     Wt Readings from Last 3 Encounters:  04/24/18 210 lb (95.3 kg)  03/12/18 204 lb (92.5 kg)  02/18/18 206 lb (93.4 kg)     GEN: Well nourished, well developed in no acute distress HEENT: Normal NECK: No JVD; No carotid bruits LYMPHATICS: No lymphadenopathy CARDIAC: regular rhythm, normal S1 and S2, no murmurs, rubs, gallops. Radial and DP pulses 2+ bilaterally. RESPIRATORY:  Clear to auscultation without rales, wheezing or rhonchi  ABDOMEN: Soft, non-tender, non-distended MUSCULOSKELETAL:  No edema; No deformity  SKIN: Warm and dry NEUROLOGIC:  Alert and oriented x 3 PSYCHIATRIC:  Normal affect   ASSESSMENT:    1. Essential hypertension   2. Chest pain, unspecified type   3. Abnormal EKG   4. SOB (shortness of breath)   5. Pre-procedure lab exam   6. Counseling on health promotion and disease prevention   7. Class 3 severe obesity due to excess calories with body mass index (BMI) of 40.0 to 44.9 in adult, unspecified whether serious comorbidity present ([place])   8. Medication management   9. Snoring    PLAN:    1. Chest pain, abnormal EKG -Her ECG has been significantly abnormal since 2017, and she had a history of bigeminy in the past. She reports a normal cath in 2012, though I cannot see the actual results. She has two types of chest pain, with typical and  atypical features. She has significant risk factors. She did not tolerate stress testing well in 2012 (tried to treadmill and had severe difficulty, ended up doing lexiscan). Given her symptoms and risk, will proceed with coronary CTA. Will need pre-procedural labs within a week of the test. She is not currently taking a beta blocker, and her resting heart rate is in the 90s. She has received metoprolol in the past and tolerated, ok to give for procedure.  2. Shortness of breath: -elevated BNP on testing, but no echo. Also has history of COPD, prior significant tobacco use and currently vapes.  -will order echocardiogram. Suspect she will have some diastolic dysfunction given her history of elevated blood pressures, but need to rule out systolic dysfunction or valvular disease  3. Hypertension -only recently coming under better control. Distantly had BP >696 systolic. States that she stopped taking the propranolol when she started the HCTZ-losartan -will start amlodipine, instructed on likely minimal LE edema. Start at 5 mg, can uptitrate to 10 mg -will d/c beta blocker given her history of COPD -instructed to check at home, 1-2 hours after medications, when seated quietly in a chair -if her echo shows diastolic dysfunction, consider starting spironolactone. Would need to D/C potassium supplements and follow K/renal function  4. Snoring -notes severe snoring, son says she stops breathing when she sleeps. Has to sleep in a chair due to her breathing. -will order sleep study, if needed CPAP would improve her BP control  5. Prevention: counseling, review of risk factors -Hypertension: as above -Tobacco use: heavy use previously, quit cigarettes several years ago but does vape. We discussed that the long term risks of vaping are  currently unknown. Counseled on avoidance of tobacco entirely. -Obesity: BMI 41. Once major cardiac issues are evaluated, then we will need to discuss diet/exercise  recommendations further. -Diabetes: no diagnosis. Last A1c 4.7 -Lifestyle: as above -Lipids: HDL 50, LDL 85 on no statin. ASCVD risk: The 10-year ASCVD risk score Mikey Bussing DC Jr., et al., 2013) is: 1.8%   Values used to calculate the score:     Age: 6 years     Sex: Female     Is Non-Hispanic African American: No     Diabetic: No     Tobacco smoker: No     Systolic Blood Pressure: 510 mmHg     Is BP treated: Yes     HDL Cholesterol: 50 mg/dL     Total Cholesterol: 165 mg/dL  Plan for follow up: testing as documented, follow up with me in 3 mos  Medication Adjustments/Labs and Tests Ordered: Current medicines are reviewed at length with the patient today.  Concerns regarding medicines are outlined above.  Orders Placed This Encounter  Procedures  . CT CORONARY MORPH W/CTA COR W/SCORE W/CA W/CM &/OR WO/CM  . CT CORONARY FRACTIONAL FLOW RESERVE DATA PREP  . CT CORONARY FRACTIONAL FLOW RESERVE FLUID ANALYSIS  . Basic metabolic panel  . EKG 12-Lead  . ECHOCARDIOGRAM COMPLETE  . Split night study   Meds ordered this encounter  Medications  . amLODipine (NORVASC) 5 MG tablet    Sig: Take 1 tablet (5 mg total) by mouth daily.    Dispense:  90 tablet    Refill:  3  . metoprolol tartrate (LOPRESSOR) 50 MG tablet    Sig: TAKE 1 TABLET 1 HR PRIOR TO CARDIAC PROCEDURE    Dispense:  1 tablet    Refill:  0    Patient Instructions  Medication Instructions: Your physician recommends that you make the following changes to your medication. Start: Amlodipine 5 mg  If you need a refill on your cardiac medications before your next appointment, please call your pharmacy.   Labwork: Your physician recommends that you return for lab work one week prior to Coronary CTA (BMP)   Procedures/Testing: Your physician has requested that you have an echocardiogram. Echocardiography is a painless test that uses sound waves to create images of your heart. It provides your doctor with information about  the size and shape of your heart and how well your heart's chambers and valves are working. This procedure takes approximately one hour. There are no restrictions for this procedure. [facility] has recommended that you have a sleep study. This test records several body functions during sleep, including: brain activity, eye movement, oxygen and carbon dioxide blood levels, heart rate and rhythm, breathing rate and rhythm, the flow of air through your mouth and nose, snoring, body muscle movements, and chest and belly movement. [facility]  Your physician has requested that you have cardiac CT. Cardiac computed tomography (CT) is a painless test that uses an x-ray machine to take clear, detailed pictures of your heart. For further information please visit [facility]uge[facility]iesta[facility]tn. Please follow instruction sheet as given. [facility]     Follow-Up: Your physician wants you to follow-up in 3 months with Dr. Harrell Gave.  Special Instructions:  Please arrive at the [facility] main entrance of [facility] at xx:xx AM (30-45 minutes prior to test start time)  [facility] [place], [place] [address] [phone]  Proceed to the  [facility] Radiology Department (First Floor).  Please follow these instructions carefully (unless otherwise directed):    On the Night Before the Test: . Drink plenty of water. . Do not consume any caffeinated/decaffeinated beverages or chocolate 12 hours prior to your test. . Do not take any antihistamines 12 hours prior to your test. . If you take Metformin do not take 24 hours prior to test.   On the Day of the Test: . Drink plenty of water. Do not drink any water within one hour of the test. . Do not eat any food 4 hours prior to the test. . You may take your regular medications prior to the test. . IF NOT ON A BETA BLOCKER - Take 50 mg of lopressor (metoprolol) one  hour before the test.   After the Test: . Drink plenty of water. . After receiving IV contrast, you may experience a mild flushed feeling. This is normal. . On occasion, you may experience a mild rash up to 24 hours after the test. This is not dangerous. If this occurs, you can take Benadryl 25 mg and increase your fluid intake. . If you experience trouble breathing, this can be serious. If it is severe call 911 IMMEDIATELY. If it is mild, please call our office.     Thank you for choosing [facility] at [facility]!!       Signed, [name], MD PhD 04/24/2018 10:58 AM    [place]

## 2018-04-29 ENCOUNTER — Telehealth: Payer: Self-pay | Admitting: *Deleted

## 2018-04-29 NOTE — Telephone Encounter (Signed)
Left message to return a call to me. 

## 2018-04-29 NOTE — Telephone Encounter (Signed)
Patient returned a call and was notified of sleep study appointment.

## 2018-05-04 ENCOUNTER — Other Ambulatory Visit: Payer: Self-pay

## 2018-05-04 ENCOUNTER — Ambulatory Visit (HOSPITAL_COMMUNITY): Payer: Medicare Other | Attending: Cardiology

## 2018-05-04 DIAGNOSIS — J449 Chronic obstructive pulmonary disease, unspecified: Secondary | ICD-10-CM | POA: Insufficient documentation

## 2018-05-04 DIAGNOSIS — Z6841 Body Mass Index (BMI) 40.0 and over, adult: Secondary | ICD-10-CM | POA: Insufficient documentation

## 2018-05-04 DIAGNOSIS — R079 Chest pain, unspecified: Secondary | ICD-10-CM | POA: Diagnosis present

## 2018-05-04 DIAGNOSIS — I10 Essential (primary) hypertension: Secondary | ICD-10-CM | POA: Diagnosis not present

## 2018-05-04 DIAGNOSIS — E669 Obesity, unspecified: Secondary | ICD-10-CM | POA: Diagnosis not present

## 2018-05-04 DIAGNOSIS — Z87891 Personal history of nicotine dependence: Secondary | ICD-10-CM | POA: Diagnosis not present

## 2018-05-04 DIAGNOSIS — R0602 Shortness of breath: Secondary | ICD-10-CM | POA: Insufficient documentation

## 2018-05-04 DIAGNOSIS — R9431 Abnormal electrocardiogram [ECG] [EKG]: Secondary | ICD-10-CM

## 2018-05-04 DIAGNOSIS — R0600 Dyspnea, unspecified: Secondary | ICD-10-CM | POA: Insufficient documentation

## 2018-05-08 ENCOUNTER — Other Ambulatory Visit: Payer: Self-pay | Admitting: Physician Assistant

## 2018-05-08 DIAGNOSIS — M069 Rheumatoid arthritis, unspecified: Secondary | ICD-10-CM

## 2018-05-08 DIAGNOSIS — G8929 Other chronic pain: Secondary | ICD-10-CM

## 2018-05-08 MED ORDER — OXYCODONE HCL 10 MG PO TABS: 10.0000 mg | ORAL_TABLET | Freq: Four times a day (QID) | ORAL | 0 refills | Status: DC | PRN

## 2018-05-08 NOTE — Telephone Encounter (Signed)
NCCSR reviewed. 

## 2018-05-08 NOTE — Telephone Encounter (Signed)
Pt needs a refill on her oxycodone 10 mg   [facility]   Thanks  teri

## 2018-05-25 ENCOUNTER — Ambulatory Visit (HOSPITAL_BASED_OUTPATIENT_CLINIC_OR_DEPARTMENT_OTHER): Payer: Medicare Other | Attending: Cardiology | Admitting: Cardiovascular Disease

## 2018-05-25 VITALS — Ht 61.0 in | Wt 200.0 lb

## 2018-05-25 DIAGNOSIS — I10 Essential (primary) hypertension: Secondary | ICD-10-CM | POA: Diagnosis not present

## 2018-05-25 DIAGNOSIS — G4761 Periodic limb movement disorder: Secondary | ICD-10-CM | POA: Diagnosis not present

## 2018-05-25 DIAGNOSIS — G4733 Obstructive sleep apnea (adult) (pediatric): Secondary | ICD-10-CM | POA: Insufficient documentation

## 2018-05-25 DIAGNOSIS — R0602 Shortness of breath: Secondary | ICD-10-CM | POA: Diagnosis present

## 2018-05-25 DIAGNOSIS — E669 Obesity, unspecified: Secondary | ICD-10-CM | POA: Insufficient documentation

## 2018-05-25 DIAGNOSIS — I493 Ventricular premature depolarization: Secondary | ICD-10-CM | POA: Insufficient documentation

## 2018-05-25 DIAGNOSIS — Z6838 Body mass index (BMI) 38.0-38.9, adult: Secondary | ICD-10-CM | POA: Diagnosis not present

## 2018-05-25 DIAGNOSIS — J449 Chronic obstructive pulmonary disease, unspecified: Secondary | ICD-10-CM | POA: Diagnosis not present

## 2018-05-25 DIAGNOSIS — R0902 Hypoxemia: Secondary | ICD-10-CM | POA: Diagnosis not present

## 2018-05-25 DIAGNOSIS — Z79899 Other long term (current) drug therapy: Secondary | ICD-10-CM | POA: Diagnosis not present

## 2018-06-04 ENCOUNTER — Encounter: Payer: Medicare Other | Admitting: *Deleted

## 2018-06-04 ENCOUNTER — Telehealth: Payer: Self-pay | Admitting: Cardiology

## 2018-06-04 ENCOUNTER — Ambulatory Visit (HOSPITAL_COMMUNITY)
Admission: RE | Admit: 2018-06-04 | Discharge: 2018-06-04 | Disposition: A | Discharge status: Home / Self Care | Payer: Medicare Other | Source: Ambulatory Visit | Attending: Cardiology | Admitting: Cardiology

## 2018-06-04 ENCOUNTER — Other Ambulatory Visit: Payer: Self-pay | Admitting: Physician Assistant

## 2018-06-04 DIAGNOSIS — Z006 Encounter for examination for normal comparison and control in clinical research program: Secondary | ICD-10-CM

## 2018-06-04 DIAGNOSIS — R0602 Shortness of breath: Secondary | ICD-10-CM

## 2018-06-04 DIAGNOSIS — R9431 Abnormal electrocardiogram [ECG] [EKG]: Secondary | ICD-10-CM | POA: Insufficient documentation

## 2018-06-04 DIAGNOSIS — R079 Chest pain, unspecified: Secondary | ICD-10-CM

## 2018-06-04 DIAGNOSIS — R911 Solitary pulmonary nodule: Secondary | ICD-10-CM | POA: Diagnosis not present

## 2018-06-04 DIAGNOSIS — M069 Rheumatoid arthritis, unspecified: Secondary | ICD-10-CM

## 2018-06-04 DIAGNOSIS — G8929 Other chronic pain: Secondary | ICD-10-CM

## 2018-06-04 LAB — POCT I-STAT CREATININE: Creatinine, Ser: 0.9 mg/dL (ref 0.44–1.00)

## 2018-06-04 MED ORDER — NITROGLYCERIN 0.4 MG SL SUBL
0.8000 mg | SUBLINGUAL_TABLET | Freq: Once | SUBLINGUAL | Status: AC
  Administered 2018-06-04: 0.8 mg via SUBLINGUAL
  Filled 2018-06-04: qty 25

## 2018-06-04 MED ORDER — NITROGLYCERIN 0.4 MG SL SUBL
SUBLINGUAL_TABLET | SUBLINGUAL | Status: AC
  Filled 2018-06-04: qty 2

## 2018-06-04 MED ORDER — METOPROLOL TARTRATE 5 MG/5ML IV SOLN
10.0000 mg | INTRAVENOUS | Status: AC | PRN
  Administered 2018-06-04 (×3): 10 mg via INTRAVENOUS
  Filled 2018-06-04 (×3): qty 10

## 2018-06-04 MED ORDER — METOPROLOL TARTRATE 5 MG/5ML IV SOLN
INTRAVENOUS | Status: AC
  Filled 2018-06-04: qty 30

## 2018-06-04 MED ORDER — IOPAMIDOL (ISOVUE-370) INJECTION 76%
100.0000 mL | Freq: Once | INTRAVENOUS | Status: AC | PRN
  Administered 2018-06-04: 80 mL via INTRAVENOUS

## 2018-06-04 NOTE — Telephone Encounter (Signed)
Thank you. I saw the report. I will make sure she is aware when we call with the results of her coronary study (not yet resulted) so that she can get the recommended follow up.

## 2018-06-04 NOTE — Research (Signed)
CADFEM Informed Consent           Subject Name:  Susan Gilmore   Subject met inclusion and exclusion criteria.  The informed consent form, study requirements and expectations were reviewed with the subject and questions and concerns were addressed prior to the signing of the consent form.  The subject verbalized understanding of the trial requirements.  The subject agreed to participate in the CADFEM trial and signed the informed consent.  The informed consent was obtained prior to performance of any protocol-specific procedures for the subject.  A copy of the signed informed consent was given to the subject and a copy was placed in the subject's medical record.   [place] Chalmers, Research Assistant  06/04/2018   07:38 a.m.

## 2018-06-04 NOTE — Progress Notes (Signed)
Patient ambulatory out of department with steady gait. Denies any complaints.  

## 2018-06-04 NOTE — Progress Notes (Signed)
CT complete. Patient denies any complaints. Patient drinking coke at this time.

## 2018-06-04 NOTE — Telephone Encounter (Signed)
New message   Per Ms. Irby need to make sure that they have the CT  report and make sure you  have the report in the system and notify the provider who ordered the test. Please call ASAP.

## 2018-06-04 NOTE — Telephone Encounter (Signed)
Imaging report pulmonary nodule in epic--  Forward to Dr Harrell Gave

## 2018-06-04 NOTE — Research (Signed)
Cadfem visit complete.

## 2018-06-04 NOTE — Telephone Encounter (Signed)
Pt contacted office for refill request on the following medications:  Oxycodone HCl 10 MG TABS   [facility]  Last Rx: 05/08/18 LOV: 03/12/18 NOV: 07/03/18 Pt is requesting this be sent in before we close for the weekend because she will run out over the weekend. Please advise. Thanks TNP

## 2018-06-05 MED ORDER — OXYCODONE HCL 10 MG PO TABS: 10.0000 mg | ORAL_TABLET | Freq: Four times a day (QID) | ORAL | 0 refills | Status: DC | PRN

## 2018-06-12 ENCOUNTER — Telehealth: Payer: Self-pay | Admitting: Cardiology

## 2018-06-12 NOTE — Telephone Encounter (Signed)
Returned call to patient she stated she was calling Susan Gilmore back.Roger Kill out of office today.After reviewing chart Coronary CT report not final.Patient was told Dr.Christopher waiting to read heart anatomy.Right lung nodule noted it is considered low risk.Recommended repeat chest ct in 1 year.Advised I will send message to [facility] for a call back.

## 2018-06-12 NOTE — Telephone Encounter (Signed)
  Pt states she is returning a call from [place] but not sure what it was about.

## 2018-06-14 ENCOUNTER — Encounter (HOSPITAL_BASED_OUTPATIENT_CLINIC_OR_DEPARTMENT_OTHER): Payer: Self-pay | Admitting: Cardiovascular Disease

## 2018-06-14 NOTE — Procedures (Signed)
Patient Name: Susan Gilmore, Susan Gilmore Date: 05/25/2018 Gender: Female D.O.B: [birth year] Age (years): 68 Referring Provider: [name] Height (inches): 61 Interpreting Physician: Shelva Majestic MD, ABSM Weight (lbs): 200 RPSGT: Jorge Ny BMI: 38 MRN: [id number] Neck Size: 16.00  CLINICAL INFORMATION Sleep Study Type: NPSG  Indication for sleep study: COPD, Hypertension, Obesity  Epworth Sleepiness Score: 7  SLEEP STUDY TECHNIQUE As per the AASM Manual for the Scoring of Sleep and Associated Events v2.3 (April 2016) with a hypopnea requiring 4% desaturations.  The channels recorded and monitored were frontal, central and occipital EEG, electrooculogram (EOG), submentalis EMG (chin), nasal and oral airflow, thoracic and abdominal wall motion, anterior tibialis EMG, snore microphone, electrocardiogram, and pulse oximetry.  MEDICATIONS     albuterol (PROAIR HFA) 108 (90 Base) MCG/ACT inhaler         ALPRAZolam (XANAX) 1 MG tablet         amLODipine (NORVASC) 5 MG tablet         amphetamine-dextroamphetamine (ADDERALL XR) 30 MG 24 hr capsule         DULoxetine (CYMBALTA) 60 MG capsule         fluticasone (FLONASE) 50 MCG/ACT nasal spray         Fluticasone-Salmeterol (ADVAIR) 250-50 MCG/DOSE AEPB         Investigational - Study Medication         ipratropium-albuterol (DUONEB) 0.5-2.5 (3) MG/3ML SOLN         loratadine (CLARITIN) 10 MG tablet         losartan-hydrochlorothiazide (HYZAAR) 100-25 MG tablet         medroxyPROGESTERone Acetate 150 MG/ML SUSY         metoprolol tartrate (LOPRESSOR) 50 MG tablet         montelukast (SINGULAIR) 10 MG tablet         omeprazole (PRILOSEC) 20 MG capsule         Oxycodone HCl 10 MG TABS         potassium chloride (K-DUR) 10 MEQ tablet         zolpidem (AMBIEN) 10 MG tablet      Medications self-administered by patient taken the night of the study : AMLODIPINE, LOSARTAN, POTASSIUM CHLORIDE, AMBIEN  SLEEP  ARCHITECTURE The study was initiated at 10:35:18 PM and ended at 5:48:30 AM.  Sleep onset time was 12.4 minutes and the sleep efficiency was 84.9%%. The total sleep time was 368 minutes.  Stage REM latency was 177.5 minutes.  The patient spent 7.1%% of the night in stage N1 sleep, 83.3%% in stage N2 sleep, 0.0%% in stage N3 and 9.7% in REM.  Alpha intrusion was absent.  Supine sleep was 60.19%.  RESPIRATORY PARAMETERS The overall apnea/hypopnea index (AHI) was 5.2 per hour. There were 0 total apneas, including 0 obstructive, 0 central and 0 mixed apneas. There were 32 hypopneas and 1 RERAs.  The AHI during Stage REM sleep was 38.9 per hour.  AHI while supine was 5.1 per hour.  The mean oxygen saturation was 94.5%. The minimum SpO2 during sleep was 77.0%.  Loud snoring was noted during this study.  CARDIAC DATA The 2 lead EKG demonstrated sinus rhythm. The mean heart rate was 77.8 beats per minute. Other EKG findings include: PVCs.  LEG MOVEMENT DATA The total PLMS were 202 with a resulting PLMS index of 32.9. Associated arousal with leg movement index was 8.8 .  IMPRESSIONS - Mild obstructive sleep apnea overall (AHI 5.2/h); however, sleep apnea was  severe during REM sleep (AHI 38.9/h). - No significant central sleep apnea occurred during this study (CAI = 0.0/h). - Significant oxygen desaturation to a nadir of 77%. - The patient snored with loud snoring volume. - EKG findings include PVCs. - Clinically significant periodic limb movements occurred during sleep. Associated arousals were significant.  DIAGNOSIS - Obstructive Sleep Apnea (327.23 [G47.33 ICD-10]) - Periodic Limb Movement During Sleep (327.51 [G47.61 ICD-10]) - Nocturnal Hypoxemia (327.26 [G47.36 ICD-10])  RECOMMENDATIONS - Recommend an in lab CPAP titration study for optimal evaluation and treatment in this patient with severe sleep apnea during REM sleep and significant oxygen desaturation. - Efforts should  be made to optimize nasal and oropharyngeal patency. - If patient is symptomatic with restless legs, consider a trial of pharmacotherapy for treatment of Periodic Leg Movements of Sleep. - Avoid alcohol, sedatives and other CNS depressants that may worsen sleep apnea and disrupt normal sleep architecture. - Sleep hygiene should be reviewed to assess factors that may improve sleep quality. - Weight management and regular exercise should be initiated or continued if appropriate.  [Electronically signed] 06/14/2018 11:02 AM  Shelva Majestic MD, [facility], [place], American Board of Sleep Medicine   NPI: [id number] [place] PH: [phone]   FX: [phone] [place]

## 2018-06-15 ENCOUNTER — Telehealth: Payer: Self-pay | Admitting: *Deleted

## 2018-06-15 ENCOUNTER — Other Ambulatory Visit: Payer: Self-pay | Admitting: Cardiovascular Disease

## 2018-06-15 DIAGNOSIS — G4733 Obstructive sleep apnea (adult) (pediatric): Secondary | ICD-10-CM

## 2018-06-15 DIAGNOSIS — I10 Essential (primary) hypertension: Secondary | ICD-10-CM

## 2018-06-15 NOTE — Telephone Encounter (Signed)
-----   Message from [name], MD sent at 06/14/2018 11:06 AM EDT ----- Mariann Laster, please notify pt of results and schedule for CPAP titration study.

## 2018-06-15 NOTE — Telephone Encounter (Signed)
Follow up    Patient is returning call to [facility].

## 2018-06-15 NOTE — Telephone Encounter (Signed)
Pt updated with results along with Dr. Judeth Cornfield recommendation. Pt verbalized that she is aware and the spot has been there for a few years. Informed pt that once we receive the heart results, she will receive a phone call. Pt verbalized understanding.

## 2018-06-15 NOTE — Telephone Encounter (Signed)
Left message to call back  

## 2018-06-15 NOTE — Telephone Encounter (Signed)
Left message to return a call to discuss sleep study results and recommendations. 

## 2018-06-16 ENCOUNTER — Telehealth: Payer: Self-pay | Admitting: *Deleted

## 2018-06-16 NOTE — Telephone Encounter (Signed)
Patient notified of sleep study results and recommendations. 

## 2018-06-16 NOTE — Telephone Encounter (Signed)
Patient notified of CPAP titration sleep study scheduled 07/28/18.

## 2018-06-16 NOTE — Telephone Encounter (Signed)
-----   Message from [name], MD sent at 06/14/2018 11:06 AM EDT ----- Mariann Laster, please notify pt of results and schedule for CPAP titration study.

## 2018-06-16 NOTE — Telephone Encounter (Signed)
-----   Message from Lauralee Evener, [place] sent at 06/15/2018  8:14 AM EDT ----- CPAP titration

## 2018-06-23 DIAGNOSIS — R079 Chest pain, unspecified: Secondary | ICD-10-CM | POA: Diagnosis not present

## 2018-06-23 NOTE — Addendum Note (Signed)
Addended by: Dorothy Spark on: 06/23/2018 01:55 PM   Modules accepted: Orders

## 2018-06-29 ENCOUNTER — Other Ambulatory Visit: Payer: Self-pay | Admitting: Physician Assistant

## 2018-06-29 DIAGNOSIS — I10 Essential (primary) hypertension: Secondary | ICD-10-CM

## 2018-06-29 MED ORDER — HYDROCHLOROTHIAZIDE 25 MG PO TABS: 25.0000 mg | ORAL_TABLET | Freq: Every day | ORAL | 0 refills | Status: DC

## 2018-06-29 MED ORDER — LOSARTAN POTASSIUM 100 MG PO TABS: 100.0000 mg | ORAL_TABLET | Freq: Every day | ORAL | 0 refills | Status: DC

## 2018-06-29 NOTE — Progress Notes (Signed)
Separate Rx for losartan 100mg  and HCTZ 25mg  sent in due to combination being on backorder.

## 2018-07-01 ENCOUNTER — Other Ambulatory Visit: Payer: Self-pay | Admitting: Physician Assistant

## 2018-07-01 DIAGNOSIS — G8929 Other chronic pain: Secondary | ICD-10-CM

## 2018-07-01 DIAGNOSIS — M069 Rheumatoid arthritis, unspecified: Secondary | ICD-10-CM

## 2018-07-01 MED ORDER — OXYCODONE HCL 10 MG PO TABS: 10.0000 mg | ORAL_TABLET | Freq: Four times a day (QID) | ORAL | 0 refills | Status: DC | PRN

## 2018-07-01 NOTE — Telephone Encounter (Signed)
Refilled.  NCCSR reviewed. 

## 2018-07-01 NOTE — Telephone Encounter (Signed)
Pt needs refill on her Oxycodone 10 mg  [facility]  [address]  Thanks teri

## 2018-07-03 ENCOUNTER — Ambulatory Visit: Payer: Self-pay | Admitting: Physician Assistant

## 2018-07-06 ENCOUNTER — Ambulatory Visit (INDEPENDENT_AMBULATORY_CARE_PROVIDER_SITE_OTHER): Payer: Medicare Other | Admitting: Physician Assistant

## 2018-07-06 DIAGNOSIS — Z3042 Encounter for surveillance of injectable contraceptive: Secondary | ICD-10-CM | POA: Diagnosis not present

## 2018-07-06 MED ORDER — MEDROXYPROGESTERONE ACETATE 150 MG/ML IM SUSP
150.0000 mg | Freq: Once | INTRAMUSCULAR | Status: AC
  Administered 2018-07-06: 150 mg via INTRAMUSCULAR

## 2018-07-06 NOTE — Progress Notes (Signed)
Nurse visit. Patient here for her Depo Provera injection. Patient to return between Jan 27- Feb 10.

## 2018-07-07 ENCOUNTER — Other Ambulatory Visit: Payer: Self-pay | Admitting: Physician Assistant

## 2018-07-07 DIAGNOSIS — J4551 Severe persistent asthma with (acute) exacerbation: Secondary | ICD-10-CM

## 2018-07-07 DIAGNOSIS — J9621 Acute and chronic respiratory failure with hypoxia: Secondary | ICD-10-CM

## 2018-07-07 NOTE — Progress Notes (Signed)
Patient: Susan Gilmore Female    DOB: [birth year]   52 y.o.   MRN: [id number] Visit Date: 07/08/2018  Today's Provider: [name], PA-C   Chief Complaint  Patient presents with  . Urinary Tract Infection   Subjective:    Urinary Tract Infection   This is a new problem. The current episode started 1 to 4 weeks ago. The problem occurs every urination. The quality of the pain is described as burning. There has been no fever. Associated symptoms include frequency and urgency. Pertinent negatives include no flank pain or hematuria. She has tried increased fluids for the symptoms. The treatment provided no relief. Her past medical history is significant for recurrent UTIs.       Allergies  Allergen Reactions  . Effexor [Venlafaxine Hcl] Swelling  . Isosorbide Nitrate Other (See Comments)    Reaction: Bad headache     Current Outpatient Medications:  .  albuterol (PROAIR HFA) 108 (90 Base) MCG/ACT inhaler, Inhale 1-2 puffs into the lungs every 6 (six) hours as needed for wheezing or shortness of breath., Disp: 18 g, Rfl: 11 .  ALPRAZolam (XANAX) 1 MG tablet, Take 1 mg by mouth 4 (four) times daily as needed for anxiety. , Disp: , Rfl:  .  amLODipine (NORVASC) 5 MG tablet, Take 1 tablet (5 mg total) by mouth daily., Disp: 90 tablet, Rfl: 3 .  amphetamine-dextroamphetamine (ADDERALL XR) 30 MG 24 hr capsule, Take 60 mg by mouth daily., Disp: , Rfl: 0 .  DULoxetine (CYMBALTA) 60 MG capsule, Take 120 mg by mouth daily. , Disp: , Rfl:  .  fluticasone (FLONASE) 50 MCG/ACT nasal spray, Place 2 sprays into both nostrils daily. (Patient taking differently: Place 2 sprays into both nostrils 2 (two) times daily. ), Disp: 16 g, Rfl: 11 .  Fluticasone-Salmeterol (ADVAIR) 250-50 MCG/DOSE AEPB, Inhale 1 puff into the lungs 2 (two) times daily., Disp: , Rfl:  .  hydrochlorothiazide (HYDRODIURIL) 25 MG tablet, Take 1 tablet (25 mg total) by mouth daily., Disp: 90 tablet, Rfl: 0 .   ipratropium-albuterol (DUONEB) 0.5-2.5 (3) MG/3ML SOLN, TAKE 3 MLS BY NEBULIZATION EVERY 6 (SIX) HOURS AS NEEDED (SHORTNESS OF BREATH)., Disp: 1080 mL, Rfl: 1 .  loratadine (CLARITIN) 10 MG tablet, Take 1 tablet by mouth daily., Disp: , Rfl:  .  losartan (COZAAR) 100 MG tablet, Take 1 tablet (100 mg total) by mouth daily., Disp: 90 tablet, Rfl: 0 .  medroxyPROGESTERone Acetate 150 MG/ML SUSY, INJECT 1 ML (150 MG TOTAL) INTO THE MUSCLE EVERY 3 (THREE) MONTHS., Disp: 1 Syringe, Rfl: 3 .  metoprolol tartrate (LOPRESSOR) 50 MG tablet, TAKE 1 TABLET 1 HR PRIOR TO CARDIAC PROCEDURE, Disp: 1 tablet, Rfl: 0 .  montelukast (SINGULAIR) 10 MG tablet, Take 1 tablet by mouth daily., Disp: , Rfl:  .  omeprazole (PRILOSEC) 20 MG capsule, Take 1 capsule (20 mg total) by mouth daily., Disp: 30 capsule, Rfl: 11 .  Oxycodone HCl 10 MG TABS, Take 1 tablet (10 mg total) by mouth every 6 (six) hours as needed. For severe pain, Disp: 120 tablet, Rfl: 0 .  potassium chloride (K-DUR) 10 MEQ tablet, Take 1 tablet (10 mEq total) by mouth daily., Disp: 30 tablet, Rfl: 5 .  zolpidem (AMBIEN) 10 MG tablet, Take 10 mg by mouth at bedtime. , Disp: , Rfl: 0 .  Investigational - Study Medication, Inject 1 Dose into the muscle every 14 (fourteen) days. Study name: [facility] Additional study details: study  medication for RA, Disp: , Rfl:   Review of Systems  Constitutional: Negative.   Respiratory: Negative.   Cardiovascular: Negative for chest pain, palpitations and leg swelling.  Gastrointestinal: Negative for abdominal pain.  Genitourinary: Positive for dysuria, frequency and urgency. Negative for enuresis, flank pain and hematuria.  Musculoskeletal: Positive for back pain (Lower).    Social History   Tobacco Use  . Smoking status: Former Smoker    Packs/day: 1.00    Years: 26.00    Pack years: 26.00    Types: Cigarettes    Last attempt to quit: 07/26/2013    Years since quitting: 4.9  . Smokeless tobacco: Never Used    Substance Use Topics  . Alcohol use: No    Alcohol/week: 0.0 standard drinks    Comment: Had a history of alcohol use but currently is not drinking any   Objective:   BP 128/70 (BP Location: Left Arm, Patient Position: Sitting, Cuff Size: Normal)   Pulse 90   Temp 98.6 F (37 C) (Oral)   Resp 16   Wt 213 lb (96.6 kg)   SpO2 97%   BMI 40.25 kg/m  Vitals:   07/08/18 0659  BP: 128/70  Pulse: 90  Resp: 16  Temp: 98.6 F (37 C)  TempSrc: Oral  SpO2: 97%  Weight: 213 lb (96.6 kg)     Physical Exam  Constitutional: She is oriented to person, place, and time. She appears well-developed and well-nourished. No distress.  Cardiovascular: Normal rate, regular rhythm and normal heart sounds. Exam reveals no gallop and no friction rub.  No murmur heard. Pulmonary/Chest: Effort normal and breath sounds normal. No respiratory distress. She has no wheezes. She has no rales.  Abdominal: Soft. Normal appearance and bowel sounds are normal. She exhibits no distension and no mass. There is no hepatosplenomegaly. There is tenderness in the suprapubic area. There is no rebound, no guarding and no CVA tenderness.  Neurological: She is alert and oriented to person, place, and time.  Skin: Skin is warm and dry. She is not diaphoretic.        Assessment & Plan:     1. Acute cystitis without hematuria Worsening symptoms. UA positive. Will treat empirically with Bactrim as below. Pyridium given for spasm. Continue to push fluids. Urine sent for culture. Will follow up pending C&S results. She is to call if symptoms do not improve or if they worsen.  - sulfamethoxazole-trimethoprim (BACTRIM DS,SEPTRA DS) 800-160 MG tablet; Take 1 tablet by mouth 2 (two) times daily.  Dispense: 14 tablet; Refill: 0  2. Burning with urination See above medical treatment plan. - POCT urinalysis dipstick - Urine Culture       [name], PA-C  [facility]

## 2018-07-08 ENCOUNTER — Ambulatory Visit (INDEPENDENT_AMBULATORY_CARE_PROVIDER_SITE_OTHER): Payer: Medicare Other | Admitting: Physician Assistant

## 2018-07-08 ENCOUNTER — Encounter: Payer: Self-pay | Admitting: Physician Assistant

## 2018-07-08 VITALS — BP 128/70 | HR 90 | Temp 98.6°F | Resp 16 | Wt 213.0 lb

## 2018-07-08 DIAGNOSIS — R300 Dysuria: Secondary | ICD-10-CM

## 2018-07-08 DIAGNOSIS — N3000 Acute cystitis without hematuria: Secondary | ICD-10-CM | POA: Diagnosis not present

## 2018-07-08 LAB — POCT URINALYSIS DIPSTICK
BILIRUBIN UA: NEGATIVE
GLUCOSE UA: NEGATIVE
Ketones, UA: NEGATIVE
Nitrite, UA: NEGATIVE
ODOR: NEGATIVE
Protein, UA: NEGATIVE
RBC UA: NEGATIVE
Spec Grav, UA: 1.015 (ref 1.010–1.025)
Urobilinogen, UA: 0.2 E.U./dL
pH, UA: 6.5 (ref 5.0–8.0)

## 2018-07-08 MED ORDER — SULFAMETHOXAZOLE-TRIMETHOPRIM 800-160 MG PO TABS: 1.0000 | ORAL_TABLET | Freq: Two times a day (BID) | ORAL | 0 refills | Status: DC

## 2018-07-09 ENCOUNTER — Telehealth: Payer: Self-pay | Admitting: Physician Assistant

## 2018-07-09 DIAGNOSIS — N3000 Acute cystitis without hematuria: Secondary | ICD-10-CM

## 2018-07-09 MED ORDER — CIPROFLOXACIN HCL 500 MG PO TABS: 500.0000 mg | ORAL_TABLET | Freq: Two times a day (BID) | ORAL | 0 refills | Status: DC

## 2018-07-09 NOTE — Telephone Encounter (Signed)
The antibiotic [name] recently prescribed will with her potassium.  Needing another Rx prescribed.  Please advise.  Thanks, [employer]

## 2018-07-09 NOTE — Telephone Encounter (Signed)
Changed to cipro

## 2018-07-09 NOTE — Telephone Encounter (Signed)
Patient called stating the pharmacist told her that Bactrim and Potassium interfere with each other.   So Susan Gilmore needs another antibiotic called in for the UTI, if possible today.

## 2018-07-09 NOTE — Telephone Encounter (Signed)
Patient was advised.  

## 2018-07-10 ENCOUNTER — Telehealth: Payer: Self-pay

## 2018-07-10 LAB — URINE CULTURE

## 2018-07-10 NOTE — Telephone Encounter (Signed)
-----   Message from [name], PA-C sent at 07/10/2018  9:10 AM EST ----- Urine culture is negative. If improving on the antibiotic may continue. If not improving, please call

## 2018-07-10 NOTE — Telephone Encounter (Signed)
Patient advised as directed below. 

## 2018-07-22 ENCOUNTER — Ambulatory Visit: Payer: Medicare Other | Admitting: Cardiology

## 2018-07-22 ENCOUNTER — Encounter: Payer: Self-pay | Admitting: Cardiology

## 2018-07-22 VITALS — BP 130/78 | HR 96 | Ht 61.0 in | Wt 210.0 lb

## 2018-07-22 DIAGNOSIS — I2584 Coronary atherosclerosis due to calcified coronary lesion: Secondary | ICD-10-CM

## 2018-07-22 DIAGNOSIS — I10 Essential (primary) hypertension: Secondary | ICD-10-CM | POA: Diagnosis not present

## 2018-07-22 DIAGNOSIS — Z79899 Other long term (current) drug therapy: Secondary | ICD-10-CM | POA: Diagnosis not present

## 2018-07-22 DIAGNOSIS — I2510 Atherosclerotic heart disease of native coronary artery without angina pectoris: Secondary | ICD-10-CM | POA: Diagnosis not present

## 2018-07-22 DIAGNOSIS — Z7189 Other specified counseling: Secondary | ICD-10-CM

## 2018-07-22 DIAGNOSIS — E782 Mixed hyperlipidemia: Secondary | ICD-10-CM

## 2018-07-22 DIAGNOSIS — Z712 Person consulting for explanation of examination or test findings: Secondary | ICD-10-CM

## 2018-07-22 MED ORDER — ATORVASTATIN CALCIUM 40 MG PO TABS: 40.0000 mg | ORAL_TABLET | Freq: Every day | ORAL | 3 refills | Status: DC

## 2018-07-22 NOTE — Progress Notes (Signed)
Cardiology Office Note:    Date:  07/22/2018   ID:  Susan Gilmore, DOB [birth year], MRN [id number]  PCP:  [name], PA-C  Cardiologist:  [name], MD PhD  Referring MD: [name]*   CC: follow up of test results  History of Present Illness:    Susan Gilmore is a 52 y.o. female with a hx of rheumatoid arthritis, COPD who is seen in follow up for review of recent test results. She was initially seen as a new consult on 04/24/09.   Cardiac history: initially seen in consult for elevated BNP (no echo). However, noted chest discomfort at that time. Nonexertional but dull, hadn't occurred recently. She was very short of breath. Had been a heavy smoker (2-3 ppd) but changed to vaping. Had risk factors for sleep apnea, was ordered for sleep study and now pending CPAP.  Remotely, was seen by Dr. [name] in 2012, reports having a normal cath (I can see the Ao pressures but not the anatomy on the uploaded document). She also saw Dr. [name] in 2013.  She reports a history of bigeminy (found in 2012-2013 incidentally). Rare issue for her, notices it when she lays down to sleep. Computer notes atrial fib/flutter in the past (she is not aware of this diagnosis, never on blood thinners). I personally reviewed all of her ECGs in the system and cannot find evidence of this.   Today: nervous about the results of her CTA. She had results over the phone, but the more she thought about it, she was confused. We discussed this at long length today. We reviewed the difference between a calcium score of 99th percentile and the FFR which analyzed severity of blockages. We reviewed her actual CT and FFR images and also reviewed on the anatomy model. We also discussed the results of her echo at similar length.  She is working on getting CPAP, still feels very shortwinded. Blood pressure has been better controlled on current medication regimen.   Past Medical History:  Diagnosis  Date  . Abnormal Pap smear 04/28/2012   normal pap /positive hpv   . Abnormal stress test   . Allergic rhinitis   . Bipolar disorder ([place])   . Carpal tunnel syndrome   . Chest pain   . Chronic anxiety   . Contraception   . Cough   . HPV (human papilloma virus) anogenital infection   . HTN (hypertension)   . Hyperplastic colon polyp   . Insomnia   . Palpitations   . Pneumonia     Past Surgical History:  Procedure Laterality Date  . full mouth dental    . GANGLION CYST EXCISION    . WISDOM TOOTH EXTRACTION      Current Medications: Current Outpatient Medications on File Prior to Visit  Medication Sig  . albuterol (PROAIR HFA) 108 (90 Base) MCG/ACT inhaler Inhale 1-2 puffs into the lungs every 6 (six) hours as needed for wheezing or shortness of breath.  . ALPRAZolam (XANAX) 1 MG tablet Take 1 mg by mouth 4 (four) times daily as needed for anxiety.   [name] amLODipine (NORVASC) 5 MG tablet Take 1 tablet (5 mg total) by mouth daily.  [name] amphetamine-dextroamphetamine (ADDERALL XR) 30 MG 24 hr capsule Take 60 mg by mouth daily.  . ciprofloxacin (CIPRO) 500 MG tablet Take 1 tablet (500 mg total) by mouth 2 (two) times daily.  . DULoxetine (CYMBALTA) 60 MG capsule Take 120 mg by mouth daily.   [name]  fluticasone (FLONASE) 50 MCG/ACT nasal spray Place 2 sprays into both nostrils daily. (Patient taking differently: Place 2 sprays into both nostrils 2 (two) times daily. )  . Fluticasone-Salmeterol (ADVAIR) 250-50 MCG/DOSE AEPB Inhale 1 puff into the lungs 2 (two) times daily.  . hydrochlorothiazide (HYDRODIURIL) 25 MG tablet Take 1 tablet (25 mg total) by mouth daily.  . Investigational - Study Medication Inject 1 Dose into the muscle every 14 (fourteen) days. Study name: [facility]o Additional study details: study medication for RA  . ipratropium-albuterol (DUONEB) 0.5-2.5 (3) MG/3ML SOLN TAKE 3 MLS BY NEBULIZATION EVERY 6 (SIX) HOURS AS NEEDED (SHORTNESS OF BREATH).  [name] loratadine (CLARITIN) 10 MG tablet  Take 1 tablet by mouth daily.  [name] losartan (COZAAR) 100 MG tablet Take 1 tablet (100 mg total) by mouth daily.  . medroxyPROGESTERone Acetate 150 MG/ML SUSY INJECT 1 ML (150 MG TOTAL) INTO THE MUSCLE EVERY 3 (THREE) MONTHS.  [name] metoprolol tartrate (LOPRESSOR) 50 MG tablet TAKE 1 TABLET 1 HR PRIOR TO CARDIAC PROCEDURE  . montelukast (SINGULAIR) 10 MG tablet Take 1 tablet by mouth daily.  [name] omeprazole (PRILOSEC) 20 MG capsule Take 1 capsule (20 mg total) by mouth daily.  . Oxycodone HCl 10 MG TABS Take 1 tablet (10 mg total) by mouth every 6 (six) hours as needed. For severe pain  . potassium chloride (K-DUR) 10 MEQ tablet Take 1 tablet (10 mEq total) by mouth daily.  [name] zolpidem (AMBIEN) 10 MG tablet Take 10 mg by mouth at bedtime.    No current facility-administered medications on file prior to visit.      Allergies:   Effexor [venlafaxine hcl] and Isosorbide nitrate   Social History   Socioeconomic History  . Marital status: Married    Spouse name: Not on file  . Number of children: 1  . Years of education: Not on file  . Highest education level: 10th grade  Occupational History  . Occupation: disability  Social Needs  . Financial resource strain: Somewhat hard  . Food insecurity:    Worry: Never true    Inability: Never true  . Transportation needs:    Medical: No    Non-medical: No  Tobacco Use  . Smoking status: Former Smoker    Packs/day: 1.00    Years: 26.00    Pack years: 26.00    Types: Cigarettes    Last attempt to quit: 07/26/2013    Years since quitting: 4.9  . Smokeless tobacco: Never Used  Substance and Sexual Activity  . Alcohol use: No    Alcohol/week: 0.0 standard drinks    Comment: Had a history of alcohol use but currently is not drinking any  . Drug use: No  . Sexual activity: Yes    Birth control/protection: Injection    Comment: depo  Lifestyle  . Physical activity:    Days per week: Not on file    Minutes per session: Not on file  . Stress: Only  a little  Relationships  . Social connections:    Talks on phone: Not on file    Gets together: Not on file    Attends religious service: Not on file    Active member of club or organization: Not on file    Attends meetings of clubs or organizations: Not on file    Relationship status: Not on file  Other Topics Concern  . Not on file  Social History Narrative   The patient is married. She has one stepson. She does not  work outside the home. She smokes 1-1/2-2 packs of cigarettes daily.     Family History: The patient's family history includes Diabetes in her maternal grandmother and mother; Heart attack in her mother; Heart disease in her father, maternal grandfather, maternal grandmother, and paternal grandmother; Hyperlipidemia in her father; Hypertension in her father, maternal grandfather, and maternal grandmother; Stroke in her father and paternal grandmother.  ROS:   Please see the history of present illness.  Additional pertinent ROS: Review of Systems  Constitutional: Positive for diaphoresis. Negative for chills and fever.  HENT: Negative for ear pain and hearing loss.   Eyes: Negative for blurred vision and pain.  Respiratory: Positive for cough and shortness of breath. Negative for hemoptysis.   Cardiovascular: Positive for chest pain, palpitations, orthopnea, leg swelling and PND. Negative for claudication.  Gastrointestinal: Negative for abdominal pain, blood in stool and melena.  Genitourinary: Negative for dysuria and hematuria.  Musculoskeletal: Positive for back pain, joint pain and myalgias. Negative for falls.  Skin: Negative for rash.  Neurological: Positive for headaches. Negative for focal weakness and loss of consciousness.  Endo/Heme/Allergies: Does not bruise/bleed easily.  Psychiatric/Behavioral: The patient is nervous/anxious and has insomnia.    EKGs/Labs/Other Studies Reviewed:    The following studies were reviewed today: Echo 05/04/18 - Left  ventricle: The cavity size was normal. 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 Systolic function was normal. The   estimated ejection fraction was in the range of 60% to 65%. Wall   motion was normal; there were no regional wall motion   abnormalities. Doppler parameters are consistent with abnormal   left ventricular relaxation (grade 1 diastolic dysfunction). - Aortic valve: Poorly visualized. Probably trileaflet; mildly   calcified leaflets. There was no stenosis. - Mitral valve: There was no significant regurgitation. - Right ventricle: The cavity size was normal. Systolic function   was normal. - Pulmonary arteries: No complete TR doppler jet so unable to   estimate PA systolic pressure. - Inferior vena cava: The vessel was normal in size. The   respirophasic diameter changes were in the normal range (>= 50%),   consistent with normal central venous pressure.  Impressions:  - Normal LV size with prominent LV hypertrophy towards the apex.   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 EF 60-65%. Normal RV size and   systolic function. No significant valvular abnormalities.  CTA coronary 06/04/18 IMPRESSION: 1. Coronary calcium score of 210. This was 54 percentile for age and sex matched control.  2. Normal coronary origin with right dominance.  3. Moderate stenosis in the mid RCA and LAD. Additional analysis with CT FFR will be submitted.  1. Left Main:  No significant stenosis. 2. LAD: No significant stenosis. 3. LCX: No significant stenosis. 4. RCA: No significant stenosis.  IMPRESSION: 1. CT FFR analysis didn't show any significant stenosis. Aggressive medical management for non-obstructive CAD is recommended.   EKG:  EKG is personally reviewed today.  The ekg ordered previously demonstrates normal sinus rhythm with significant (though  unchanged) ST changes. Diffuse ST depressions and T wave inversions throughout, with elevation in aVR. Borderline anteroseptal infarct pattern. This has been unchanged since 2017.  Recent Labs: 02/18/2018: ALT 33; BUN 16; Hemoglobin 14.8; Platelets 197; Potassium 3.0; Sodium 142; TSH 2.620 06/04/2018: Creatinine, Ser 0.90  Recent Lipid Panel    Component Value Date/Time   CHOL 165 02/18/2018 0930  CHOL 166 06/22/2014 1033   TRIG 148 02/18/2018 0930   TRIG 175 06/22/2014 1033   HDL 50 02/18/2018 0930   HDL 39 (L) 06/22/2014 1033   CHOLHDL 3.3 02/18/2018 0930   VLDL 35 06/22/2014 1033   LDLCALC 85 02/18/2018 0930   LDLCALC 92 06/22/2014 1033    Physical Exam:    VS:  BP 130/78   Pulse 96   Ht 5\' 1"  (1.549 m)   Wt 210 lb (95.3 kg)   SpO2 99%   BMI 39.68 kg/m     Wt Readings from Last 3 Encounters:  07/22/18 210 lb (95.3 kg)  07/08/18 213 lb (96.6 kg)  05/25/18 200 lb (90.7 kg)     GEN: Well nourished, well developed in no acute distress HEENT: Normal NECK: No JVD; No carotid bruits LYMPHATICS: No lymphadenopathy CARDIAC: regular rhythm, normal S1 and S2, no murmurs, rubs, gallops. Radial and DP pulses 2+ bilaterally. RESPIRATORY:  Clear to auscultation without rales, wheezing or rhonchi  ABDOMEN: Soft, non-tender, non-distended MUSCULOSKELETAL:  No edema; No deformity  SKIN: Warm and dry NEUROLOGIC:  Alert and oriented x 3 PSYCHIATRIC:  Normal affect   ASSESSMENT:    1. Coronary artery disease due to calcified coronary lesion   2. Essential hypertension   3. Counseling on health promotion and disease prevention   4. Medication management   5. Mixed hyperlipidemia   6. Encounter to discuss test results    PLAN:    1. Chest pain, abnormal EKG--> coronary CTA with diffuse calcification, 99th percentile, with two localized lesions (FFR WNL) -discussed what this means at length. She is concerned about this, but we discussed that the goal is aggressive prevention.  Her LDL was not terribly high, but her risk may be coming from her concurrent inflammatory processes and/or other etiology (lipoprotein A).  -will initiate statin (as below). Also discussed aspirin, follow up at next visit  2. Shortness of breath: -elevated BNP on testing, but no echo. Also has history of COPD, prior significant tobacco use and currently vapes.  -diastolic dysfunction on echo, discussed how this is managed -also noted to have sleep apnea, pending CPAP titration  3. Hypertension -much better controlled today -continue amlodipine -next med would be spironolactone given diastolic dysfunction. With her underlying lung disease, would avoid beta blocker, though this may also be beneficial for CAD  4. Secondary Prevention: counseling, review of risk factors -recommend heart healthy/Mediterranean diet, with whole grains, fruits, vegetable, fish, lean meats, nuts, and olive oil. Limit salt. -recommend moderate walking, 3-5 times/week for 30-50 minutes each session. Aim for at least 150 minutes.week. Goal should be pace of 3 miles/hours, or walking 1.5 miles in 30 minutes -recommend avoidance of tobacco products. Avoid excess alcohol.  -Additional risk factor control: -Hypertension: as above -Tobacco use: heavy use previously, quit cigarettes several years ago but does vape. We discussed that the long term risks of vaping are currently unknown. Counseled on avoidance of tobacco entirely. -Obesity: BMI 39. Working on weight loss -Diabetes: no diagnosis. Last A1c 4.7 -Lipids: HDL 50, LDL 85. Interestingly, her ASCVD risk is low. However, with known CAD will initiate high intensity atorvastatin today. LDL goal <70, so she meets hyperlipidemia based on this ASCVD risk: The 10-year ASCVD risk score Mikey Bussing DC Jr., et al., 2013) is: 1.5%   Values used to calculate the score:     Age: 53 years     Sex: Female     Is Non-Hispanic African American: No  Diabetic: No     Tobacco smoker:  No     Systolic Blood Pressure: 659 mmHg     Is BP treated: Yes     HDL Cholesterol: 50 mg/dL     Total Cholesterol: 165 mg/dL  Plan for follow up: 3 mos  TIME SPENT WITH PATIENT: >40 minutes of direct patient care. More than 50% of that time was spent on coordination of care and counseling regarding test results at length, diagnosis of CAD, what this means, prevention strategies and medication recommendations.  [name], MD, PhD [place]  [facility]   Medication Adjustments/Labs and Tests Ordered: Current medicines are reviewed at length with the patient today.  Concerns regarding medicines are outlined above.  Orders Placed This Encounter  Procedures  . Lipid panel   Meds ordered this encounter  Medications  . atorvastatin (LIPITOR) 40 MG tablet    Sig: Take 1 tablet (40 mg total) by mouth daily.    Dispense:  90 tablet    Refill:  3    Patient Instructions  Medication Instructions:  Your Physician recommend you continue on your current medication as directed.    If you need a refill on your cardiac medications before your next appointment, please call your pharmacy.   Lab work: Your physician recommends that you return for lab work in 3 months (lipid)  If you have labs (blood work) drawn today and your tests are completely normal, you will receive your results only by: [name] MyChart Message (if you have MyChart) OR . A paper copy in the mail If you have any lab test that is abnormal or we need to change your treatment, we will call you to review the results.  Testing/Procedures: None  Follow-Up: At [facility], you and your health needs are our priority.  As part of our continuing mission to provide you with exceptional heart care, we have created designated Provider Care Teams.  These Care Teams include your primary Cardiologist (physician) and Advanced Practice Providers (APPs -  Physician Assistants and Nurse Practitioners) who all work together  to provide you with the care you need, when you need it. You will need a follow up appointment in 3 months.  Please call our office 2 months in advance to schedule this appointment.  You may see [name], MD or one of the following Advanced Practice Providers on your designated Care Team:   Rosaria Ferries, PA-C . Jory Sims, DNP, ANP  Any Other Special Instructions Will Be Listed Below (If Applicable).     Signed, [name], MD PhD 07/22/2018 8:04 AM    [place]

## 2018-07-22 NOTE — Patient Instructions (Signed)
Medication Instructions:  Your Physician recommend you continue on your current medication as directed.    If you need a refill on your cardiac medications before your next appointment, please call your pharmacy.   Lab work: Your physician recommends that you return for lab work in 3 months (lipid)  If you have labs (blood work) drawn today and your tests are completely normal, you will receive your results only by: [name] MyChart Message (if you have MyChart) OR . A paper copy in the mail If you have any lab test that is abnormal or we need to change your treatment, we will call you to review the results.  Testing/Procedures: None  Follow-Up: At [facility], you and your health needs are our priority.  As part of our continuing mission to provide you with exceptional heart care, we have created designated Provider Care Teams.  These Care Teams include your primary Cardiologist (physician) and Advanced Practice Providers (APPs -  Physician Assistants and Nurse Practitioners) who all work together to provide you with the care you need, when you need it. You will need a follow up appointment in 3 months.  Please call our office 2 months in advance to schedule this appointment.  You may see [name], MD or one of the following Advanced Practice Providers on your designated Care Team:   Rosaria Ferries, PA-C . Jory Sims, DNP, ANP  Any Other Special Instructions Will Be Listed Below (If Applicable).

## 2018-07-23 ENCOUNTER — Encounter: Payer: Self-pay | Admitting: Cardiology

## 2018-07-27 ENCOUNTER — Other Ambulatory Visit: Payer: Self-pay | Admitting: Physician Assistant

## 2018-07-27 ENCOUNTER — Telehealth: Payer: Self-pay | Admitting: Physician Assistant

## 2018-07-27 DIAGNOSIS — G8929 Other chronic pain: Secondary | ICD-10-CM

## 2018-07-27 DIAGNOSIS — M069 Rheumatoid arthritis, unspecified: Secondary | ICD-10-CM

## 2018-07-27 MED ORDER — OXYCODONE HCL 10 MG PO TABS: 10.0000 mg | ORAL_TABLET | Freq: Four times a day (QID) | ORAL | 0 refills | Status: DC | PRN

## 2018-07-27 NOTE — Telephone Encounter (Signed)
°  Opened in error - [facility] °

## 2018-07-27 NOTE — Telephone Encounter (Signed)
Pt needing a refill on:  Oxycodone HCl 10 MG TABS  Please fill at:  [id number] - [place], [place] - [address] [phone] (Phone) [phone] (Fax)    Thanks, [employer]

## 2018-07-28 ENCOUNTER — Encounter

## 2018-07-28 ENCOUNTER — Ambulatory Visit (HOSPITAL_BASED_OUTPATIENT_CLINIC_OR_DEPARTMENT_OTHER): Payer: Medicare Other | Attending: Cardiovascular Disease | Admitting: Cardiovascular Disease

## 2018-07-28 VITALS — Ht 61.0 in | Wt 200.0 lb

## 2018-07-28 DIAGNOSIS — Z79899 Other long term (current) drug therapy: Secondary | ICD-10-CM | POA: Diagnosis not present

## 2018-07-28 DIAGNOSIS — G4733 Obstructive sleep apnea (adult) (pediatric): Secondary | ICD-10-CM | POA: Diagnosis present

## 2018-07-28 DIAGNOSIS — I493 Ventricular premature depolarization: Secondary | ICD-10-CM | POA: Insufficient documentation

## 2018-07-28 DIAGNOSIS — I10 Essential (primary) hypertension: Secondary | ICD-10-CM | POA: Diagnosis present

## 2018-08-09 ENCOUNTER — Encounter (HOSPITAL_BASED_OUTPATIENT_CLINIC_OR_DEPARTMENT_OTHER): Payer: Self-pay | Admitting: Cardiovascular Disease

## 2018-08-09 NOTE — Procedures (Signed)
Patient Name: Susan Gilmore, Susan Gilmore Date: 07/28/2018 Gender: Female D.O.B: 1965/09/18 Age (years): 38 Referring Provider: [name] Height (inches): 61 Interpreting Physician: Shelva Majestic MD, ABSM Weight (lbs): 200 RPSGT: Rebekah Chesterfield BMI: 38 MRN: [id number] Neck Size: 16.00  CLINICAL INFORMATION The patient is referred for a CPAP titration to treat sleep apnea.  Date of NPSG:  05/25/2018: AHI 5.2/h; RDI 5.4/h;  AHI during REM sleep 38.9/h; severe O2 desaturation to 77%.  SLEEP STUDY TECHNIQUE As per the AASM Manual for the Scoring of Sleep and Associated Events v2.3 (April 2016) with a hypopnea requiring 4% desaturations.  The channels recorded and monitored were frontal, central and occipital EEG, electrooculogram (EOG), submentalis EMG (chin), nasal and oral airflow, thoracic and abdominal wall motion, anterior tibialis EMG, snore microphone, electrocardiogram, and pulse oximetry. Continuous positive airway pressure (CPAP) was initiated at the beginning of the study and titrated to treat sleep-disordered breathing.  MEDICATIONS     albuterol (PROAIR HFA) 108 (90 Base) MCG/ACT inhaler             ALPRAZolam (XANAX) 1 MG tablet         amLODipine (NORVASC) 5 MG tablet         amphetamine-dextroamphetamine (ADDERALL XR) 30 MG 24 hr capsule         atorvastatin (LIPITOR) 40 MG tablet         DULoxetine (CYMBALTA) 60 MG capsule         fluticasone (FLONASE) 50 MCG/ACT nasal spray         Fluticasone-Salmeterol (ADVAIR) 250-50 MCG/DOSE AEPB         hydrochlorothiazide (HYDRODIURIL) 25 MG tablet         ipratropium-albuterol (DUONEB) 0.5-2.5 (3) MG/3ML SOLN         loratadine (CLARITIN) 10 MG tablet         losartan (COZAAR) 100 MG tablet         medroxyPROGESTERone Acetate 150 MG/ML SUSY         montelukast (SINGULAIR) 10 MG tablet         omeprazole (PRILOSEC) 20 MG capsule         Oxycodone HCl 10 MG TABS         potassium chloride (K-DUR) 10 MEQ tablet          zolpidem (AMBIEN) 10 MG tablet      Medications self-administered by patient taken the night of the study : XANAX  TECHNICIAN COMMENTS Comments added by technician: Patient was restless all through the night. Comments added by scorer: N/A  RESPIRATORY PARAMETERS Optimal PAP Pressure (cm): 19 AHI at Optimal Pressure (/hr): 0.0 Overall Minimal O2 (%): 89.0 Supine % at Optimal Pressure (%): 0 Minimal O2 at Optimal Pressure (%): 95.0   SLEEP ARCHITECTURE The study was initiated at 10:35:12 PM and ended at 5:45:40 AM.  Sleep onset time was 7.5 minutes and the sleep efficiency was 72.0%%. The total sleep time was 309.9 minutes.  The patient spent 18.7%% of the night in stage N1 sleep, 74.3%% in stage N2 sleep, 4.0%% in stage N3 and 2.9% in REM.Stage REM latency was 361.0 minutes  Wake after sleep onset was 113.0. Alpha intrusion was absent. Supine sleep was 41.78%.  CARDIAC DATA The 2 lead EKG demonstrated sinus rhythm. The mean heart rate was 88.4 beats per minute. Other EKG findings include: PVCs.  LEG MOVEMENT DATA The total Periodic Limb Movements of Sleep (PLMS) were 0. The PLMS index was 0.0. A PLMS index  of <15 is considered normal in adults.  IMPRESSIONS - CPAP was initiated at 5 cm and was titrated to 19 cm due to continued snoring. AHI was 0 commencing at 13 cm of water.  The optimal PAP pressure was 19 cm of water. - Central sleep apnea was not noted during this titration (CAI = 0.0/h). - Mild oxygen desaturations to a nadir of 89.0% at 8 cm. - The patient snored with moderate snoring volume during this titration study. - 2-lead EKG demonstrated: PVCs - Clinically significant periodic limb movements were not noted during this study. Arousals associated with PLMs were rare.  DIAGNOSIS - Obstructive Sleep Apnea (327.23 [G47.33 ICD-10])  RECOMMENDATIONS - Recommend an initial trial of CPAP Auto therapy with EPR of 3 at 14 - 20 cm H2O with heated humidification.  A  Medium size Resmed Full Face Mask AirFit 20 for Her mask was used for the titration.  - Efforts should be made to optimize nasal and oropharyngeal patency. - Avoid alcohol, sedatives and other CNS depressants that may worsen sleep apnea and disrupt normal sleep architecture. - Sleep hygiene should be reviewed to assess factors that may improve sleep quality. - Weight management and regular exercise should be initiated or continued. - Recommend a download be obtained in 30 days and sleep clinic evaluation after 4 weeks of therapy.   [Electronically signed] 08/09/2018 11:21 AM  Shelva Majestic MD, [facility], [place], American Board of Sleep Medicine   NPI: [id number] [place] PH: [phone]   FX: [phone] [place]

## 2018-08-10 ENCOUNTER — Telehealth: Payer: Self-pay | Admitting: *Deleted

## 2018-08-10 NOTE — Telephone Encounter (Signed)
-----   Message from [name], MD sent at 08/09/2018 11:25 AM EST ----- Susan Gilmore,  Please have DME set up CPAP and f/u sleep clinic

## 2018-08-10 NOTE — Telephone Encounter (Signed)
Patient informed sleep study completed. CPAP referral sent to [facility]. Once they receive benefits they will contact her for set up. Patient voiced understanding.

## 2018-08-14 ENCOUNTER — Other Ambulatory Visit: Payer: Self-pay | Admitting: Physician Assistant

## 2018-08-14 DIAGNOSIS — E876 Hypokalemia: Secondary | ICD-10-CM

## 2018-08-27 ENCOUNTER — Other Ambulatory Visit: Payer: Self-pay | Admitting: Physician Assistant

## 2018-08-27 DIAGNOSIS — M069 Rheumatoid arthritis, unspecified: Secondary | ICD-10-CM

## 2018-08-27 DIAGNOSIS — G8929 Other chronic pain: Secondary | ICD-10-CM

## 2018-08-27 MED ORDER — OXYCODONE HCL 10 MG PO TABS: 10.0000 mg | ORAL_TABLET | Freq: Four times a day (QID) | ORAL | 0 refills | Status: DC | PRN

## 2018-08-27 NOTE — Telephone Encounter (Signed)
Patient requesting refill on Oxycodone 10 mg. Sent to [facility] on [address].

## 2018-09-23 ENCOUNTER — Other Ambulatory Visit: Payer: Self-pay | Admitting: Physician Assistant

## 2018-09-23 ENCOUNTER — Other Ambulatory Visit: Payer: Self-pay

## 2018-09-23 DIAGNOSIS — G8929 Other chronic pain: Secondary | ICD-10-CM

## 2018-09-23 DIAGNOSIS — I10 Essential (primary) hypertension: Secondary | ICD-10-CM

## 2018-09-23 DIAGNOSIS — M069 Rheumatoid arthritis, unspecified: Secondary | ICD-10-CM

## 2018-09-23 MED ORDER — OXYCODONE HCL 10 MG PO TABS: 10.0000 mg | ORAL_TABLET | Freq: Four times a day (QID) | ORAL | 0 refills | Status: DC | PRN

## 2018-09-23 NOTE — Telephone Encounter (Signed)
[place] reviewed. Oxycodone refilled

## 2018-09-23 NOTE — Telephone Encounter (Signed)
Patient requesting medication refill for the following medication to be send to [facility] Oxycodone HCl 10 MG TABS

## 2018-09-24 ENCOUNTER — Other Ambulatory Visit: Payer: Self-pay | Admitting: Physician Assistant

## 2018-09-24 DIAGNOSIS — I10 Essential (primary) hypertension: Secondary | ICD-10-CM

## 2018-09-28 ENCOUNTER — Ambulatory Visit (INDEPENDENT_AMBULATORY_CARE_PROVIDER_SITE_OTHER): Payer: Medicare Other | Admitting: Physician Assistant

## 2018-09-28 DIAGNOSIS — Z3042 Encounter for surveillance of injectable contraceptive: Secondary | ICD-10-CM

## 2018-09-28 MED ORDER — MEDROXYPROGESTERONE ACETATE 150 MG/ML IM SUSP
150.0000 mg | Freq: Once | INTRAMUSCULAR | Status: AC
  Administered 2018-09-28: 150 mg via INTRAMUSCULAR

## 2018-09-28 NOTE — Progress Notes (Signed)
Nurse visit. Patient here for her Depo Provera injection. Patient to return between April 21 - May 5,2020

## 2018-10-21 ENCOUNTER — Other Ambulatory Visit: Payer: Self-pay | Admitting: Physician Assistant

## 2018-10-21 DIAGNOSIS — E876 Hypokalemia: Secondary | ICD-10-CM

## 2018-10-21 DIAGNOSIS — M069 Rheumatoid arthritis, unspecified: Secondary | ICD-10-CM

## 2018-10-21 DIAGNOSIS — G8929 Other chronic pain: Secondary | ICD-10-CM

## 2018-10-21 MED ORDER — OXYCODONE HCL 10 MG PO TABS: 10.0000 mg | ORAL_TABLET | Freq: Four times a day (QID) | ORAL | 0 refills | Status: DC | PRN

## 2018-10-21 MED ORDER — POTASSIUM CITRATE ER 10 MEQ (1080 MG) PO TBCR: 10.0000 meq | EXTENDED_RELEASE_TABLET | Freq: Every day | ORAL | 1 refills | Status: DC

## 2018-10-21 NOTE — Telephone Encounter (Signed)
Refilled. Changed potassium chloride to potassium citrate

## 2018-10-21 NOTE — Telephone Encounter (Signed)
Patient advised as directed below. 

## 2018-10-21 NOTE — Telephone Encounter (Signed)
Pt needing refills on:  Oxycodone HCl 10 MG TABS  Potassium pills ( she doesn't know the name, but said it's not potassium chloride)  Please fill at:  [id number] - [place], [place] - [address] [phone] (Phone) [phone] (Fax)   Thanks, [employer]

## 2018-10-23 ENCOUNTER — Encounter: Payer: Self-pay | Admitting: Cardiology

## 2018-10-23 ENCOUNTER — Ambulatory Visit: Payer: Medicare Other | Admitting: Cardiology

## 2018-10-23 VITALS — BP 124/70 | HR 91 | Ht 61.0 in | Wt 200.0 lb

## 2018-10-23 DIAGNOSIS — Z79899 Other long term (current) drug therapy: Secondary | ICD-10-CM

## 2018-10-23 DIAGNOSIS — R0609 Other forms of dyspnea: Secondary | ICD-10-CM

## 2018-10-23 DIAGNOSIS — Z7189 Other specified counseling: Secondary | ICD-10-CM

## 2018-10-23 DIAGNOSIS — I2510 Atherosclerotic heart disease of native coronary artery without angina pectoris: Secondary | ICD-10-CM | POA: Diagnosis not present

## 2018-10-23 DIAGNOSIS — I2584 Coronary atherosclerosis due to calcified coronary lesion: Secondary | ICD-10-CM

## 2018-10-23 DIAGNOSIS — R072 Precordial pain: Secondary | ICD-10-CM

## 2018-10-23 DIAGNOSIS — I10 Essential (primary) hypertension: Secondary | ICD-10-CM | POA: Diagnosis not present

## 2018-10-23 MED ORDER — ASPIRIN EC 81 MG PO TBEC: 81.0000 mg | DELAYED_RELEASE_TABLET | Freq: Every day | ORAL | 3 refills | Status: AC

## 2018-10-23 NOTE — Progress Notes (Addendum)
Cardiology Office Note:    Date:  10/23/2018   ID:  Susan Gilmore, DOB [birth year], MRN [id number]  PCP:  [name], PA-C  Cardiologist:  [name], MD PhD  Referring MD: [name]*   CC: follow up  History of Present Illness:    Susan Gilmore is a 53 y.o. female with a hx of rheumatoid arthritis, COPD who is seen in follow up. She was initially seen as a new consult on 04/24/18.   Cardiac history: initially seen in consult for elevated BNP (no echo). However, noted chest discomfort at that time. Nonexertional but dull, hadn't occurred recently. She was very short of breath. Had been a heavy smoker (2-3 ppd) but changed to vaping. Had risk factors for sleep apnea, was ordered for sleep study and now pending CPAP.  Remotely, was seen by Dr. [name] in 2012, reports having a normal cath (I can see the Ao pressures but not the anatomy on the uploaded document). She also saw Dr. [name] in 2013.  She reports a history of bigeminy (found in 2012-2013 incidentally). Rare issue for her, notices it when she lays down to sleep. Computer notes atrial fib/flutter in the past (she is not aware of this diagnosis, never on blood thinners). I personally reviewed all of her ECGs in the system and cannot find evidence of this. Had coronary CTA, was 99th percentile in calcium score.  Today: overall, feels like she is doing better. Still feels occasional chest discomfort, like a little ache but not a deep hurt. Has been under a lot of stress. Husband just had a major stroke, was at [facility] for over a month. Just now learning to speak again. Doing rehab. He is the breadwinner, and this has caused huge stress for her. She feels that her breathing is improving with CPAP use. Has lost 10 lbs intentionally, congratulated. Not using cigarettes, still vaping.   Past Medical History:  Diagnosis Date  . Abnormal Pap smear 04/28/2012   normal pap /positive hpv   . Abnormal stress test     . Allergic rhinitis   . Bipolar disorder ([place])   . Carpal tunnel syndrome   . Chest pain   . Chronic anxiety   . Contraception   . Cough   . HPV (human papilloma virus) anogenital infection   . HTN (hypertension)   . Hyperplastic colon polyp   . Insomnia   . Palpitations   . Pneumonia     Past Surgical History:  Procedure Laterality Date  . full mouth dental    . GANGLION CYST EXCISION    . WISDOM TOOTH EXTRACTION      Current Medications: Current Outpatient Medications on File Prior to Visit  Medication Sig  . albuterol (PROAIR HFA) 108 (90 Base) MCG/ACT inhaler Inhale 1-2 puffs into the lungs every 6 (six) hours as needed for wheezing or shortness of breath.  . ALPRAZolam (XANAX) 1 MG tablet Take 1 mg by mouth 4 (four) times daily as needed for anxiety.   [name] amLODipine (NORVASC) 5 MG tablet Take 1 tablet (5 mg total) by mouth daily.  [name] amphetamine-dextroamphetamine (ADDERALL XR) 30 MG 24 hr capsule Take 60 mg by mouth daily.  [name] atorvastatin (LIPITOR) 40 MG tablet Take 1 tablet (40 mg total) by mouth daily.  . DULoxetine (CYMBALTA) 60 MG capsule Take 120 mg by mouth daily.   . fluticasone (FLONASE) 50 MCG/ACT nasal spray Place 2 sprays into both nostrils daily. (Patient taking differently:  Place 2 sprays into both nostrils 2 (two) times daily. )  . Fluticasone-Salmeterol (ADVAIR) 250-50 MCG/DOSE AEPB Inhale 1 puff into the lungs 2 (two) times daily.  . hydrochlorothiazide (HYDRODIURIL) 25 MG tablet TAKE 1 TABLET BY MOUTH EVERY DAY  . ipratropium-albuterol (DUONEB) 0.5-2.5 (3) MG/3ML SOLN TAKE 3 MLS BY NEBULIZATION EVERY 6 (SIX) HOURS AS NEEDED (SHORTNESS OF BREATH).  [name] loratadine (CLARITIN) 10 MG tablet Take 1 tablet by mouth daily.  [name] losartan (COZAAR) 100 MG tablet TAKE 1 TABLET BY MOUTH EVERY DAY  . medroxyPROGESTERone Acetate 150 MG/ML SUSY INJECT 1 ML (150 MG TOTAL) INTO THE MUSCLE EVERY 3 (THREE) MONTHS.  [name] montelukast (SINGULAIR) 10 MG tablet Take 1 tablet by mouth  daily.  [name] omeprazole (PRILOSEC) 20 MG capsule Take 1 capsule (20 mg total) by mouth daily.  . Oxycodone HCl 10 MG TABS Take 1 tablet (10 mg total) by mouth every 6 (six) hours as needed. For severe pain  . potassium citrate (UROCIT-K) 10 MEQ (1080 MG) SR tablet Take 1 tablet (10 mEq total) by mouth daily.  [name] zolpidem (AMBIEN) 10 MG tablet Take 10 mg by mouth at bedtime.    No current facility-administered medications on file prior to visit.      Allergies:   Effexor [venlafaxine hcl] and Isosorbide nitrate   Social History   Socioeconomic History  . Marital status: Married    Spouse name: Not on file  . Number of children: 1  . Years of education: Not on file  . Highest education level: 10th grade  Occupational History  . Occupation: disability  Social Needs  . Financial resource strain: Somewhat hard  . Food insecurity:    Worry: Never true    Inability: Never true  . Transportation needs:    Medical: No    Non-medical: No  Tobacco Use  . Smoking status: Former Smoker    Packs/day: 1.00    Years: 26.00    Pack years: 26.00    Types: Cigarettes    Last attempt to quit: 07/26/2013    Years since quitting: 5.2  . Smokeless tobacco: Never Used  Substance and Sexual Activity  . Alcohol use: No    Alcohol/week: 0.0 standard drinks    Comment: Had a history of alcohol use but currently is not drinking any  . Drug use: No  . Sexual activity: Yes    Birth control/protection: Injection    Comment: depo  Lifestyle  . Physical activity:    Days per week: Not on file    Minutes per session: Not on file  . Stress: Only a little  Relationships  . Social connections:    Talks on phone: Not on file    Gets together: Not on file    Attends religious service: Not on file    Active member of club or organization: Not on file    Attends meetings of clubs or organizations: Not on file    Relationship status: Not on file  Other Topics Concern  . Not on file  Social History  Narrative   The patient is married. She has one stepson. She does not work outside the home. She smokes 1-1/2-2 packs of cigarettes daily.     Family History: The patient's family history includes Diabetes in her maternal grandmother and mother; Heart attack in her mother; Heart disease in her father, maternal grandfather, maternal grandmother, and paternal grandmother; Hyperlipidemia in her father; Hypertension in her father, maternal grandfather, and maternal grandmother; Stroke  in her father and paternal grandmother.  ROS:   Please see the history of present illness.  Additional pertinent ROS: Review of Systems  Constitutional: Negative for chills, diaphoresis and fever.  HENT: Negative for ear pain and hearing loss.   Eyes: Negative for blurred vision and pain.  Respiratory: Positive for shortness of breath. Negative for cough and hemoptysis.   Cardiovascular: Positive for chest pain. Negative for orthopnea, claudication, leg swelling and PND.  Gastrointestinal: Negative for abdominal pain, blood in stool and melena.  Genitourinary: Negative for dysuria and hematuria.  Musculoskeletal: Positive for back pain and joint pain. Negative for falls.  Skin: Negative for rash.  Neurological: Positive for headaches. Negative for focal weakness and loss of consciousness.  Endo/Heme/Allergies: Does not bruise/bleed easily.  Psychiatric/Behavioral: The patient is nervous/anxious.    EKGs/Labs/Other Studies Reviewed:    The following studies were reviewed today: Echo 05/04/18 - Left ventricle: The cavity size was normal. 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 Systolic function was normal. The   estimated ejection fraction was in the range of 60% to 65%. Wall   motion was normal; there were no regional wall motion   abnormalities. Doppler parameters are consistent with abnormal   left ventricular relaxation (grade 1  diastolic dysfunction). - Aortic valve: Poorly visualized. Probably trileaflet; mildly   calcified leaflets. There was no stenosis. - Mitral valve: There was no significant regurgitation. - Right ventricle: The cavity size was normal. Systolic function   was normal. - Pulmonary arteries: No complete TR doppler jet so unable to   estimate PA systolic pressure. - Inferior vena cava: The vessel was normal in size. The   respirophasic diameter changes were in the normal range (>= 50%),   consistent with normal central venous pressure.  Impressions: - Normal LV size with prominent LV hypertrophy towards the apex.   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 EF 60-65%. Normal RV size and   systolic function. No significant valvular abnormalities.  CTA coronary 06/04/18 IMPRESSION: 1. Coronary calcium score of 210. This was 51 percentile for age and sex matched control.  2. Normal coronary origin with right dominance.  3. Moderate stenosis in the mid RCA and LAD. Additional analysis with CT FFR will be submitted.  1. Left Main:  No significant stenosis. 2. LAD: No significant stenosis. 3. LCX: No significant stenosis. 4. RCA: No significant stenosis.  IMPRESSION: 1. CT FFR analysis didn't show any significant stenosis. Aggressive medical management for non-obstructive CAD is recommended.   EKG:  EKG is personally reviewed today.  The ekg ordered 04/24/18 demonstrates normal sinus rhythm with significant (though unchanged) ST changes. Diffuse ST depressions and T wave inversions throughout, with elevation in aVR. Borderline anteroseptal infarct pattern. This has been unchanged since 2017.  Recent Labs: 02/18/2018: ALT 33; BUN 16; Hemoglobin 14.8; Platelets 197; Potassium 3.0; Sodium 142; TSH 2.620 06/04/2018: Creatinine, Ser 0.90  Recent Lipid Panel    Component Value Date/Time   CHOL 165 02/18/2018 0930   CHOL 166  06/22/2014 1033   TRIG 148 02/18/2018 0930   TRIG 175 06/22/2014 1033   HDL 50 02/18/2018 0930   HDL 39 (L) 06/22/2014 1033   CHOLHDL 3.3 02/18/2018 0930   VLDL 35 06/22/2014 1033   LDLCALC 85 02/18/2018 0930   LDLCALC 92 06/22/2014 1033    Physical Exam:    VS:  BP 124/70  Pulse 91   Ht 5\' 1"  (1.549 m)   Wt 200 lb (90.7 kg)   SpO2 96%   BMI 37.79 kg/m     Wt Readings from Last 3 Encounters:  10/23/18 200 lb (90.7 kg)  07/28/18 200 lb (90.7 kg)  07/22/18 210 lb (95.3 kg)     GEN: Well nourished, well developed in no acute distress HEENT: Normal NECK: No JVD; No carotid bruits LYMPHATICS: No lymphadenopathy CARDIAC: regular rhythm, normal S1 and S2, no murmurs, rubs, gallops. Radial and DP pulses 2+ bilaterally. RESPIRATORY:  Clear to auscultation without rales, wheezing or rhonchi  ABDOMEN: Soft, non-tender, non-distended MUSCULOSKELETAL:  No edema; No deformity  SKIN: Warm and dry NEUROLOGIC:  Alert and oriented x 3 PSYCHIATRIC:  Normal affect   ASSESSMENT:    1. Coronary artery disease due to calcified coronary lesion   2. Essential hypertension   3. Precordial pain   4. Dyspnea on exertion   5. Counseling on health promotion and disease prevention   6. Medication management    PLAN:    1. Chest pain, abnormal EKG--> coronary CTA with diffuse calcification, 99th percentile, with two localized lesions (FFR WNL). Consistent with coronary artery disease -aggressive prevention. Despite baseline LDL <100, she has significant disease. Started atorvastatin 40 mg, recheck lipids and LFTs today -discussed aspirin, will start today  2. Shortness of breath: improving, better with CPAP -elevated BNP on testing, but no echo at that time. Also has history of COPD, prior significant tobacco use and currently vapes.  -diastolic dysfunction on echo, discussed how this is managed -also noted to have sleep apnea, working on getting CPAP updated  3. Hypertension -much  better controlled today -continue amlodipine -next med would be spironolactone given diastolic dysfunction. With her underlying lung disease, would avoid beta blocker, though this may also be beneficial for CAD  4. Secondary Prevention: counseling, review of risk factors -recommend heart healthy/Mediterranean diet, with whole grains, fruits, vegetable, fish, lean meats, nuts, and olive oil. Limit salt. -recommend moderate walking, 3-5 times/week for 30-50 minutes each session. Aim for at least 150 minutes.week. Goal should be pace of 3 miles/hours, or walking 1.5 miles in 30 minutes -recommend avoidance of tobacco products. Avoid excess alcohol.  -Additional risk factor control: -Tobacco use: heavy use previously, quit cigarettes several years ago but does vape. We discussed that the long term risks of vaping are currently unknown. Counseled on avoidance of tobacco entirely. -Obesity: BMI 37. Working on weight loss -Diabetes: no diagnosis. Last A1c 4.7 -Lipids:Baseline HDL 50, LDL 85. LDL goal <70  Obstructive sleep apnea, on CPAP: patient is compliant with CPAP and is benefitting from the therapy. Continue.  Plan for follow up: 6 mos  TIME SPENT WITH PATIENT: 25 minutes of direct patient care. More than 50% of that time was spent on coordination of care and counseling regarding secondary prevention recommendations.  [name], MD, PhD [employer]  [facility]   Medication Adjustments/Labs and Tests Ordered: Current medicines are reviewed at length with the patient today.  Concerns regarding medicines are outlined above.  Orders Placed This Encounter  Procedures  . Lipid panel  . Hepatic function panel   Meds ordered this encounter  Medications  . aspirin EC 81 MG tablet    Sig: Take 1 tablet (81 mg total) by mouth daily.    Dispense:  90 tablet    Refill:  3    Patient Instructions  Medication Instructions:  Start: Aspirin 81 mg daily  If you need a refill  on your cardiac medications before your next appointment, please call your pharmacy.   Lab work: Your physician recommends that you return for lab work today ( lipid, LFT)  If you have labs (blood work) drawn today and your tests are completely normal, you will receive your results only by: [name] MyChart Message (if you have MyChart) OR . A paper copy in the mail If you have any lab test that is abnormal or we need to change your treatment, we will call you to review the results.  Testing/Procedures: None  Follow-Up: At [facility], you and your health needs are our priority.  As part of our continuing mission to provide you with exceptional heart care, we have created designated Provider Care Teams.  These Care Teams include your primary Cardiologist (physician) and Advanced Practice Providers (APPs -  Physician Assistants and Nurse Practitioners) who all work together to provide you with the care you need, when you need it. You will need a follow up appointment in 6 months.  Please call our office 2 months in advance to schedule this appointment.  You may see [name], MD or one of the following Advanced Practice Providers on your designated Care Team:   Rosaria Ferries, PA-C . Jory Sims, DNP, ANP      Signed, [name], MD PhD 10/23/2018 11:15 AM    [place]

## 2018-10-23 NOTE — Patient Instructions (Signed)
Medication Instructions:  Start: Aspirin 81 mg daily  If you need a refill on your cardiac medications before your next appointment, please call your pharmacy.   Lab work: Your physician recommends that you return for lab work today ( lipid, LFT)  If you have labs (blood work) drawn today and your tests are completely normal, you will receive your results only by: [name] MyChart Message (if you have MyChart) OR . A paper copy in the mail If you have any lab test that is abnormal or we need to change your treatment, we will call you to review the results.  Testing/Procedures: None  Follow-Up: At [facility], you and your health needs are our priority.  As part of our continuing mission to provide you with exceptional heart care, we have created designated Provider Care Teams.  These Care Teams include your primary Cardiologist (physician) and Advanced Practice Providers (APPs -  Physician Assistants and Nurse Practitioners) who all work together to provide you with the care you need, when you need it. You will need a follow up appointment in 6 months.  Please call our office 2 months in advance to schedule this appointment.  You may see [name], MD or one of the following Advanced Practice Providers on your designated Care Team:   Rosaria Ferries, PA-C . Jory Sims, DNP, ANP

## 2018-10-24 LAB — LIPID PANEL
CHOL/HDL RATIO: 2.7 ratio (ref 0.0–4.4)
Cholesterol, Total: 84 mg/dL — ABNORMAL LOW (ref 100–199)
HDL: 31 mg/dL — ABNORMAL LOW (ref >39)
LDL CALC: 38 mg/dL (ref 0–99)
Triglycerides: 74 mg/dL (ref 0–149)
VLDL Cholesterol Cal: 15 mg/dL (ref 5–40)

## 2018-10-24 LAB — HEPATIC FUNCTION PANEL
ALBUMIN: 4 g/dL (ref 3.8–4.9)
ALT: 23 IU/L (ref 0–32)
AST: 15 IU/L (ref 0–40)
Alkaline Phosphatase: 106 IU/L (ref 39–117)
BILIRUBIN TOTAL: 0.6 mg/dL (ref 0.0–1.2)
BILIRUBIN, DIRECT: 0.16 mg/dL (ref 0.00–0.40)
TOTAL PROTEIN: 6.6 g/dL (ref 6.0–8.5)

## 2018-11-19 ENCOUNTER — Telehealth: Payer: Self-pay | Admitting: Cardiology

## 2018-11-19 NOTE — Telephone Encounter (Signed)
Encounter not needed

## 2018-11-20 ENCOUNTER — Other Ambulatory Visit: Payer: Self-pay | Admitting: *Deleted

## 2018-11-20 DIAGNOSIS — M069 Rheumatoid arthritis, unspecified: Secondary | ICD-10-CM

## 2018-11-20 DIAGNOSIS — G8929 Other chronic pain: Secondary | ICD-10-CM

## 2018-11-20 MED ORDER — OXYCODONE HCL 10 MG PO TABS: 10.0000 mg | ORAL_TABLET | Freq: Four times a day (QID) | ORAL | 0 refills | Status: DC | PRN

## 2018-11-25 ENCOUNTER — Encounter: Payer: Self-pay | Admitting: Physician Assistant

## 2018-11-25 ENCOUNTER — Ambulatory Visit (INDEPENDENT_AMBULATORY_CARE_PROVIDER_SITE_OTHER): Payer: Medicare Other | Admitting: Physician Assistant

## 2018-11-25 VITALS — BP 128/78 | HR 97 | Temp 98.1°F | Wt 190.8 lb

## 2018-11-25 DIAGNOSIS — B379 Candidiasis, unspecified: Secondary | ICD-10-CM

## 2018-11-25 DIAGNOSIS — J441 Chronic obstructive pulmonary disease with (acute) exacerbation: Secondary | ICD-10-CM | POA: Diagnosis not present

## 2018-11-25 DIAGNOSIS — T3695XA Adverse effect of unspecified systemic antibiotic, initial encounter: Secondary | ICD-10-CM

## 2018-11-25 MED ORDER — AMOXICILLIN-POT CLAVULANATE 875-125 MG PO TABS: 1.0000 | ORAL_TABLET | Freq: Two times a day (BID) | ORAL | 0 refills | Status: DC

## 2018-11-25 MED ORDER — FLUCONAZOLE 150 MG PO TABS: 150.0000 mg | ORAL_TABLET | Freq: Once | ORAL | 0 refills | Status: AC

## 2018-11-25 MED ORDER — PREDNISONE 10 MG PO TABS: ORAL_TABLET | ORAL | 0 refills | Status: DC

## 2018-11-25 MED ORDER — BENZONATATE 200 MG PO CAPS: 200.0000 mg | ORAL_CAPSULE | Freq: Three times a day (TID) | ORAL | 0 refills | Status: DC | PRN

## 2018-11-25 NOTE — Patient Instructions (Signed)
Acute Bronchitis, Adult Acute bronchitis is when air tubes (bronchi) in the lungs suddenly get swollen. The condition can make it hard to breathe. It can also cause these symptoms:  A cough.  Coughing up clear, yellow, or green mucus.  Wheezing.  Chest congestion.  Shortness of breath.  A fever.  Body aches.  Chills.  A sore throat. Follow these instructions at home:  Medicines  Take over-the-counter and prescription medicines only as told by your doctor.  If you were prescribed an antibiotic medicine, take it as told by your doctor. Do not stop taking the antibiotic even if you start to feel better. General instructions  Rest.  Drink enough fluids to keep your pee (urine) pale yellow.  Avoid smoking and secondhand smoke. If you smoke and you need help quitting, ask your doctor. Quitting will help your lungs heal faster.  Use an inhaler, cool mist vaporizer, or humidifier as told by your doctor.  Keep all follow-up visits as told by your doctor. This is important. How is this prevented? To lower your risk of getting this condition again:  Wash your hands often with soap and water. If you cannot use soap and water, use hand sanitizer.  Avoid contact with people who have cold symptoms.  Try not to touch your hands to your mouth, nose, or eyes.  Make sure to get the flu shot every year. Contact a doctor if:  Your symptoms do not get better in 2 weeks. Get help right away if:  You cough up blood.  You have chest pain.  You have very bad shortness of breath.  You become dehydrated.  You faint (pass out) or keep feeling like you are going to pass out.  You keep throwing up (vomiting).  You have a very bad headache.  Your fever or chills gets worse. This information is not intended to replace advice given to you by your health care provider. Make sure you discuss any questions you have with your health care provider. Document Released: 01/29/2008 Document  Revised: 03/26/2017 Document Reviewed: 01/31/2016 Elsevier Interactive Patient Education  2019 [employer]  

## 2018-11-25 NOTE — Progress Notes (Signed)
Patient: Susan Gilmore Female    DOB: 1965/10/26   53 y.o.   MRN: [id number] Visit Date: 11/25/2018  Today's Provider: [name], PA-C   Chief Complaint  Patient presents with  . URI   Subjective:    I, Tiburcio Pea, CMA, am acting as a Education administrator for [employer], PA-C.   URI   This is a new problem. The current episode started in the past 7 days. The problem has been gradually worsening. There has been no fever. Associated symptoms include congestion, coughing, rhinorrhea and wheezing. Pertinent negatives include no ear pain, sinus pain, sneezing or sore throat. Associated symptoms comments: Hoarseness and SOB. Treatments tried: Nebulizer and cough drops. The treatment provided no relief.  Patient does have a history of COPD, chronic bronchitis. No fever, no known exposure to COVID. States this feels like her previous COPD exacerbations.   Allergies  Allergen Reactions  . Effexor [Venlafaxine Hcl] Swelling  . Isosorbide Nitrate Other (See Comments)    Reaction: Bad headache     Current Outpatient Medications:  .  albuterol (PROAIR HFA) 108 (90 Base) MCG/ACT inhaler, Inhale 1-2 puffs into the lungs every 6 (six) hours as needed for wheezing or shortness of breath., Disp: 18 g, Rfl: 11 .  ALPRAZolam (XANAX) 1 MG tablet, Take 1 mg by mouth 4 (four) times daily as needed for anxiety. , Disp: , Rfl:  .  amLODipine (NORVASC) 5 MG tablet, Take 1 tablet (5 mg total) by mouth daily., Disp: 90 tablet, Rfl: 3 .  amphetamine-dextroamphetamine (ADDERALL XR) 30 MG 24 hr capsule, Take 60 mg by mouth daily., Disp: , Rfl: 0 .  aspirin EC 81 MG tablet, Take 1 tablet (81 mg total) by mouth daily., Disp: 90 tablet, Rfl: 3 .  atorvastatin (LIPITOR) 40 MG tablet, Take 1 tablet (40 mg total) by mouth daily., Disp: 90 tablet, Rfl: 3 .  DULoxetine (CYMBALTA) 60 MG capsule, Take 120 mg by mouth daily. , Disp: , Rfl:  .  fluticasone (FLONASE) 50 MCG/ACT nasal spray, Place 2 sprays into  both nostrils daily. (Patient taking differently: Place 2 sprays into both nostrils 2 (two) times daily. ), Disp: 16 g, Rfl: 11 .  Fluticasone-Salmeterol (ADVAIR) 250-50 MCG/DOSE AEPB, Inhale 1 puff into the lungs 2 (two) times daily., Disp: , Rfl:  .  hydrochlorothiazide (HYDRODIURIL) 25 MG tablet, TAKE 1 TABLET BY MOUTH EVERY DAY, Disp: 90 tablet, Rfl: 1 .  ipratropium-albuterol (DUONEB) 0.5-2.5 (3) MG/3ML SOLN, TAKE 3 MLS BY NEBULIZATION EVERY 6 (SIX) HOURS AS NEEDED (SHORTNESS OF BREATH)., Disp: 1080 mL, Rfl: 1 .  loratadine (CLARITIN) 10 MG tablet, Take 1 tablet by mouth daily., Disp: , Rfl:  .  losartan (COZAAR) 100 MG tablet, TAKE 1 TABLET BY MOUTH EVERY DAY, Disp: 90 tablet, Rfl: 1 .  medroxyPROGESTERone Acetate 150 MG/ML SUSY, INJECT 1 ML (150 MG TOTAL) INTO THE MUSCLE EVERY 3 (THREE) MONTHS., Disp: 1 Syringe, Rfl: 3 .  montelukast (SINGULAIR) 10 MG tablet, Take 1 tablet by mouth daily., Disp: , Rfl:  .  omeprazole (PRILOSEC) 20 MG capsule, Take 1 capsule (20 mg total) by mouth daily., Disp: 30 capsule, Rfl: 11 .  Oxycodone HCl 10 MG TABS, Take 1 tablet (10 mg total) by mouth every 6 (six) hours as needed. For severe pain, Disp: 120 tablet, Rfl: 0 .  potassium citrate (UROCIT-K) 10 MEQ (1080 MG) SR tablet, Take 1 tablet (10 mEq total) by mouth daily., Disp: 90 tablet,  Rfl: 1 .  zolpidem (AMBIEN) 10 MG tablet, Take 10 mg by mouth at bedtime. , Disp: , Rfl: 0  Review of Systems  Constitutional: Negative.  Negative for fever.  HENT: Positive for congestion, postnasal drip and rhinorrhea. Negative for ear pain, sinus pressure, sinus pain, sneezing and sore throat.   Respiratory: Positive for cough, shortness of breath and wheezing. Negative for chest tightness.   Cardiovascular: Negative.   Musculoskeletal: Negative.   Neurological: Negative.     Social History   Tobacco Use  . Smoking status: Former Smoker    Packs/day: 1.00    Years: 26.00    Pack years: 26.00    Types: Cigarettes     Last attempt to quit: 07/26/2013    Years since quitting: 5.3  . Smokeless tobacco: Never Used  Substance Use Topics  . Alcohol use: No    Alcohol/week: 0.0 standard drinks    Comment: Had a history of alcohol use but currently is not drinking any      Objective:   BP 128/78 (BP Location: Right Arm, Patient Position: Sitting, Cuff Size: Large)   Pulse 97   Temp 98.1 F (36.7 C) (Oral)   Wt 190 lb 12.8 oz (86.5 kg)   SpO2 97%   BMI 36.05 kg/m  Vitals:   11/25/18 0814  BP: 128/78  Pulse: 97  Temp: 98.1 F (36.7 C)  TempSrc: Oral  SpO2: 97%  Weight: 190 lb 12.8 oz (86.5 kg)     Physical Exam Vitals signs reviewed.  Constitutional:      General: She is not in acute distress.    Appearance: Normal appearance. She is well-developed. She is obese. She is not ill-appearing or diaphoretic.  HENT:     Head: Normocephalic and atraumatic.     Right Ear: Hearing, tympanic membrane, ear canal and external ear normal.     Left Ear: Hearing, tympanic membrane, ear canal and external ear normal.     Nose: Nose normal.     Mouth/Throat:     Mouth: Mucous membranes are moist.     Pharynx: Uvula midline. No oropharyngeal exudate or posterior oropharyngeal erythema.  Eyes:     General: No scleral icterus.       Right eye: No discharge.        Left eye: No discharge.     Conjunctiva/sclera: Conjunctivae normal.     Pupils: Pupils are equal, round, and reactive to light.  Neck:     Musculoskeletal: Normal range of motion and neck supple.     Thyroid: No thyromegaly.     Trachea: No tracheal deviation.  Cardiovascular:     Rate and Rhythm: Normal rate and regular rhythm.     Heart sounds: Normal heart sounds. No murmur. No friction rub. No gallop.   Pulmonary:     Effort: Pulmonary effort is normal. No respiratory distress.     Breath sounds: No stridor. Wheezing (expiratory throughout) present. No rales.  Lymphadenopathy:     Cervical: No cervical adenopathy.  Skin:     General: Skin is warm and dry.  Neurological:     Mental Status: She is alert.         Assessment & Plan    1. COPD with acute exacerbation (HCC) Worsening. Will treat with Augmentin, prednisone and tessalon perles. Push fluids. Continue inhalers and nebulizers as prescribed. Rest. Call if worsening.  - predniSONE (DELTASONE) 10 MG tablet; Take 6 tabs PO on day 1&2, 5 tabs PO on  day 3&4, 4 tabs PO on day 5&6, 3 tabs PO on day 7&8, 2 tabs PO on day 9&10, 1 tab PO on day 11&12.  Dispense: 42 tablet; Refill: 0 - amoxicillin-clavulanate (AUGMENTIN) 875-125 MG tablet; Take 1 tablet by mouth 2 (two) times daily.  Dispense: 20 tablet; Refill: 0 - benzonatate (TESSALON) 200 MG capsule; Take 1 capsule (200 mg total) by mouth 3 (three) times daily as needed for cough.  Dispense: 30 capsule; Refill: 0  2. Antibiotic-induced yeast infection Gets yeast infections with diflucan. Will treat with diflucan as below.  - fluconazole (DIFLUCAN) 150 MG tablet; Take 1 tablet (150 mg total) by mouth once for 1 dose.  Dispense: 1 tablet; Refill: 0     [name], PA-C  [facility]

## 2018-12-22 ENCOUNTER — Other Ambulatory Visit: Payer: Self-pay

## 2018-12-22 DIAGNOSIS — M069 Rheumatoid arthritis, unspecified: Secondary | ICD-10-CM

## 2018-12-22 DIAGNOSIS — G8929 Other chronic pain: Secondary | ICD-10-CM

## 2018-12-22 MED ORDER — OXYCODONE HCL 10 MG PO TABS: 10.0000 mg | ORAL_TABLET | Freq: Four times a day (QID) | ORAL | 0 refills | Status: DC | PRN

## 2018-12-22 NOTE — Telephone Encounter (Signed)
Patient is requesting a refill on Oxycodone. 

## 2018-12-25 ENCOUNTER — Other Ambulatory Visit: Payer: Self-pay | Admitting: *Deleted

## 2018-12-25 ENCOUNTER — Ambulatory Visit (INDEPENDENT_AMBULATORY_CARE_PROVIDER_SITE_OTHER): Payer: Medicare Other | Admitting: Physician Assistant

## 2018-12-25 ENCOUNTER — Ambulatory Visit: Payer: Self-pay | Admitting: Physician Assistant

## 2018-12-25 ENCOUNTER — Other Ambulatory Visit: Payer: Self-pay

## 2018-12-25 DIAGNOSIS — Z3042 Encounter for surveillance of injectable contraceptive: Secondary | ICD-10-CM

## 2018-12-25 MED ORDER — MEDROXYPROGESTERONE ACETATE 150 MG/ML IM SUSP
150.0000 mg | Freq: Once | INTRAMUSCULAR | Status: AC
  Administered 2018-12-25: 150 mg via INTRAMUSCULAR

## 2018-12-25 NOTE — Progress Notes (Signed)
Patient was given 150 mg/mL injection of Depo Provera in right ventrogluteal.  Patient tolerated well.  Exp: 05/25/2022  Next injection due July 17-31. Scheduled appt for 03/15/2019.

## 2018-12-25 NOTE — Patient Instructions (Signed)
Next injection due July 17-31. Scheduled appt for 03/15/2019.

## 2018-12-30 MED ORDER — MEDROXYPROGESTERONE ACETATE 150 MG/ML IM SUSY: 1.0000 mL | PREFILLED_SYRINGE | INTRAMUSCULAR | 3 refills | Status: DC

## 2018-12-31 ENCOUNTER — Other Ambulatory Visit: Payer: Self-pay | Admitting: Physician Assistant

## 2018-12-31 DIAGNOSIS — J9621 Acute and chronic respiratory failure with hypoxia: Secondary | ICD-10-CM

## 2018-12-31 DIAGNOSIS — J4551 Severe persistent asthma with (acute) exacerbation: Secondary | ICD-10-CM

## 2019-01-14 ENCOUNTER — Telehealth: Payer: Self-pay | Admitting: *Deleted

## 2019-01-14 NOTE — Telephone Encounter (Signed)
-----   Message from Sueanne Margarita, MD sent at 01/12/2019  7:00 PM EDT ----- Good AHI and compliance.  Continue current PAP settings.

## 2019-01-14 NOTE — Telephone Encounter (Signed)
Patient is aware and agreeable to AHI being within range at 0.6. Patient is aware and agreeable to being in compliance with machine usage Patient is aware and agreeable to no change in current pressures

## 2019-01-20 ENCOUNTER — Other Ambulatory Visit: Payer: Self-pay

## 2019-01-20 DIAGNOSIS — M069 Rheumatoid arthritis, unspecified: Secondary | ICD-10-CM

## 2019-01-20 DIAGNOSIS — G8929 Other chronic pain: Secondary | ICD-10-CM

## 2019-01-20 MED ORDER — OXYCODONE HCL 10 MG PO TABS: 10.0000 mg | ORAL_TABLET | Freq: Four times a day (QID) | ORAL | 0 refills | Status: DC | PRN

## 2019-02-19 ENCOUNTER — Other Ambulatory Visit: Payer: Self-pay

## 2019-02-19 ENCOUNTER — Ambulatory Visit: Payer: Medicare Other

## 2019-02-19 DIAGNOSIS — G8929 Other chronic pain: Secondary | ICD-10-CM

## 2019-02-19 DIAGNOSIS — M069 Rheumatoid arthritis, unspecified: Secondary | ICD-10-CM

## 2019-02-19 MED ORDER — OXYCODONE HCL 10 MG PO TABS: 10.0000 mg | ORAL_TABLET | Freq: Four times a day (QID) | ORAL | 0 refills | Status: DC | PRN

## 2019-02-19 NOTE — Telephone Encounter (Signed)
Patient requesting a refill request for the following medication: Oxycodone HCl 10 MG TABS  Pharmacy:  [facility]

## 2019-03-15 ENCOUNTER — Other Ambulatory Visit: Payer: Self-pay

## 2019-03-15 ENCOUNTER — Ambulatory Visit: Payer: Self-pay | Admitting: Physician Assistant

## 2019-03-15 DIAGNOSIS — Z3042 Encounter for surveillance of injectable contraceptive: Secondary | ICD-10-CM

## 2019-03-15 MED ORDER — MEDROXYPROGESTERONE ACETATE 150 MG/ML IM SUSY: 1.0000 mL | PREFILLED_SYRINGE | INTRAMUSCULAR | 3 refills | Status: DC

## 2019-03-16 ENCOUNTER — Emergency Department
Admission: EM | Admit: 2019-03-16 | Discharge: 2019-03-16 | Disposition: A | Discharge status: Home / Self Care | Payer: Medicare Other | Attending: Emergency Medicine | Admitting: Emergency Medicine

## 2019-03-16 ENCOUNTER — Emergency Department: Payer: Medicare Other

## 2019-03-16 ENCOUNTER — Encounter: Payer: Self-pay | Admitting: Emergency Medicine

## 2019-03-16 ENCOUNTER — Other Ambulatory Visit: Payer: Self-pay

## 2019-03-16 DIAGNOSIS — R1032 Left lower quadrant pain: Secondary | ICD-10-CM | POA: Insufficient documentation

## 2019-03-16 DIAGNOSIS — Z87891 Personal history of nicotine dependence: Secondary | ICD-10-CM | POA: Insufficient documentation

## 2019-03-16 DIAGNOSIS — J45909 Unspecified asthma, uncomplicated: Secondary | ICD-10-CM | POA: Diagnosis not present

## 2019-03-16 DIAGNOSIS — Z79899 Other long term (current) drug therapy: Secondary | ICD-10-CM | POA: Diagnosis not present

## 2019-03-16 DIAGNOSIS — Z7982 Long term (current) use of aspirin: Secondary | ICD-10-CM | POA: Diagnosis not present

## 2019-03-16 DIAGNOSIS — I2510 Atherosclerotic heart disease of native coronary artery without angina pectoris: Secondary | ICD-10-CM | POA: Insufficient documentation

## 2019-03-16 DIAGNOSIS — R1013 Epigastric pain: Secondary | ICD-10-CM | POA: Insufficient documentation

## 2019-03-16 DIAGNOSIS — I10 Essential (primary) hypertension: Secondary | ICD-10-CM | POA: Diagnosis not present

## 2019-03-16 LAB — CBC WITH DIFFERENTIAL/PLATELET
Abs Immature Granulocytes: 0.06 10*3/uL (ref 0.00–0.07)
Basophils Absolute: 0.1 10*3/uL (ref 0.0–0.1)
Basophils Relative: 1 %
Eosinophils Absolute: 0 10*3/uL (ref 0.0–0.5)
Eosinophils Relative: 0 %
HCT: 33.1 % — ABNORMAL LOW (ref 36.0–46.0)
Hemoglobin: 11.3 g/dL — ABNORMAL LOW (ref 12.0–15.0)
Immature Granulocytes: 1 %
Lymphocytes Relative: 8 %
Lymphs Abs: 1 10*3/uL (ref 0.7–4.0)
MCH: 27.6 pg (ref 26.0–34.0)
MCHC: 34.1 g/dL (ref 30.0–36.0)
MCV: 80.9 fL (ref 80.0–100.0)
Monocytes Absolute: 0.6 10*3/uL (ref 0.1–1.0)
Monocytes Relative: 6 %
Neutro Abs: 9.5 10*3/uL — ABNORMAL HIGH (ref 1.7–7.7)
Neutrophils Relative %: 84 %
Platelets: 226 10*3/uL (ref 150–400)
RBC: 4.09 MIL/uL (ref 3.87–5.11)
RDW: 16.1 % — ABNORMAL HIGH (ref 11.5–15.5)
WBC: 11.3 10*3/uL — ABNORMAL HIGH (ref 4.0–10.5)
nRBC: 0 % (ref 0.0–0.2)

## 2019-03-16 LAB — TROPONIN I (HIGH SENSITIVITY): Troponin I (High Sensitivity): 5 ng/L (ref <18)

## 2019-03-16 LAB — COMPREHENSIVE METABOLIC PANEL
ALT: 23 U/L (ref 0–44)
AST: 20 U/L (ref 15–41)
Albumin: 3.6 g/dL (ref 3.5–5.0)
Alkaline Phosphatase: 104 U/L (ref 38–126)
Anion gap: 12 (ref 5–15)
BUN: 16 mg/dL (ref 6–20)
CO2: 22 mmol/L (ref 22–32)
Calcium: 8.7 mg/dL — ABNORMAL LOW (ref 8.9–10.3)
Chloride: 102 mmol/L (ref 98–111)
Creatinine, Ser: 0.83 mg/dL (ref 0.44–1.00)
GFR calc Af Amer: >60 mL/min (ref >60)
GFR calc non Af Amer: >60 mL/min (ref >60)
Glucose, Bld: 129 mg/dL — ABNORMAL HIGH (ref 70–99)
Potassium: 3 mmol/L — ABNORMAL LOW (ref 3.5–5.1)
Sodium: 136 mmol/L (ref 135–145)
Total Bilirubin: 0.6 mg/dL (ref 0.3–1.2)
Total Protein: 7 g/dL (ref 6.5–8.1)

## 2019-03-16 LAB — FIBRIN DERIVATIVES D-DIMER (ARMC ONLY): Fibrin derivatives D-dimer (ARMC): 741.38 ng/mL (FEU) — ABNORMAL HIGH (ref 0.00–499.00)

## 2019-03-16 MED ORDER — TRAMADOL HCL 50 MG PO TABS: 50.0000 mg | ORAL_TABLET | Freq: Four times a day (QID) | ORAL | 0 refills | Status: DC | PRN

## 2019-03-16 MED ORDER — PREDNISONE 50 MG PO TABS: ORAL_TABLET | ORAL | 0 refills | Status: DC

## 2019-03-16 MED ORDER — IOHEXOL 350 MG/ML SOLN
75.0000 mL | Freq: Once | INTRAVENOUS | Status: AC | PRN
  Administered 2019-03-16: 75 mL via INTRAVENOUS

## 2019-03-16 NOTE — ED Provider Notes (Addendum)
IMPRESSION: 1. Suboptimal opacification of the pulmonary arteries in the upper lung zones with no pulmonary emboli seen. 2. Mildly bilateral hilar adenopathy, most likely reactive. 3. Stable small bilateral lung nodules measuring up to 5 mm in mean diameter each. No follow-up needed if patient is low-risk (and has no known or suspected primary neoplasm). Non-contrast chest CT can be considered in 12 months if patient is high-risk. This recommendation follows the consensus statement: Guidelines for Management of Incidental Pulmonary Nodules Detected on CT Images: From the Fleischner Society 2017; Radiology 2017; 284:228-243. 4. Stable possible early changes of cirrhosis of the liver and borderline splenomegaly. 5. Calcific coronary artery and aortic atherosclerosis.  Aortic Atherosclerosis (ICD10-I70.0).  CT as dictated above.  Symptoms possibly coming from rheumatoid arthritis.  She is cleared for outpatient follow-up.   Earleen Newport, MD 03/16/19 1038    Earleen Newport, MD 03/16/19 [phone]

## 2019-03-16 NOTE — ED Notes (Signed)
Patient transported to CT 

## 2019-03-16 NOTE — ED Notes (Signed)
Pt resting comfortably. No known needs at this time.

## 2019-03-16 NOTE — ED Triage Notes (Signed)
Pt to triage via w/c with no distress noted; reports mid CP for several days accomp by legs aching

## 2019-03-16 NOTE — ED Provider Notes (Signed)
[facility] Emergency Department Provider Note   ____________________________________________   First MD Initiated Contact with Patient 03/16/19 [phone]     (approximate)  I have reviewed the triage vital signs and the nursing notes.   HISTORY  Chief Complaint Chest Pain    HPI Susan Gilmore is a 53 y.o. female patient complains of achy pain that is worse with deep breathing or movement in her epigastric area.  She also has some pain in her left groin and her toes get numb when she walks she says.  She does not have any pain or swelling in her calf or ankles at this point.   Movement makes her epigastric area and her leg hurt.  Pain is achy moderate in severity      Past Medical History:  Diagnosis Date  . Abnormal Pap smear 04/28/2012   normal pap /positive hpv   . Abnormal stress test   . Allergic rhinitis   . Bipolar disorder ([place])   . Carpal tunnel syndrome   . Chest pain   . Chronic anxiety   . Contraception   . Cough   . HPV (human papilloma virus) anogenital infection   . HTN (hypertension)   . Hyperplastic colon polyp   . Insomnia   . Palpitations   . Pneumonia     Patient Active Problem List   Diagnosis Date Noted  . Coronary artery disease due to calcified coronary lesion 10/23/2018  . 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 Encounter for chronic pain management 12/13/2015  . Chronic pain 12/13/2015  . Asthma 06/05/2015  . Rheumatoid arthritis ([place]) 06/05/2015  . 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 Allergic rhinitis 03/15/2015  . ADD (attention deficit disorder) 12/29/2014  . 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 Back ache 12/29/2014  . Bipolar disorder ([place]) 12/29/2014  . Carpal tunnel syndrome 12/29/2014  . Chest pain 12/29/2014  . Chronic anxiety 12/29/2014  . Elevated blood sugar 12/29/2014  . Elevated rheumatoid factor 12/29/2014  . Acid reflux 12/29/2014  . Infectious human wart virus  12/29/2014  . Benign neoplasm of colon 12/29/2014  . Cephalalgia 12/29/2014  . Cannot sleep 12/29/2014  . Awareness of heartbeats 12/29/2014  . LAD (lymphadenopathy), retroperitoneal 12/29/2014  . Fast heart beat 12/29/2014  . Avitaminosis D 12/29/2014  . Paroxysmal digital cyanosis 02/17/2014  . Ventricular bigeminy 01/14/2012  . Hypertension 01/14/2012  . Abnormal EKG 01/14/2012    Past Surgical History:  Procedure Laterality Date  . full mouth dental    . GANGLION CYST EXCISION    . WISDOM TOOTH EXTRACTION      Prior to Admission medications   Medication Sig Start Date End Date Taking? Authorizing Provider  albuterol (PROAIR HFA) 108 (90 Base) MCG/ACT inhaler Inhale 1-2 puffs into the lungs every 6 (six) hours as needed for wheezing or shortness of breath. 12/10/17   [name], PA-C  ALPRAZolam [name]) 1 MG tablet Take 1 mg by mouth 4 (four) times daily as needed for anxiety.     [provider]  amLODipine (NORVASC) 5 MG tablet Take 1 tablet (5 mg total) by mouth daily. 04/24/18 04/19/19  [name], MD  amoxicillin-clavulanate (AUGMENTIN) 875-125 MG tablet Take 1 tablet by mouth 2 (two) times daily. 11/25/18   [name], PA-C  amphetamine-dextroamphetamine (ADDERALL XR) 30 MG 24 hr capsule Take 60 mg by mouth daily. 05/05/15   [provider]  aspirin EC 81 MG tablet Take 1 tablet (  81 mg total) by mouth daily. 10/23/18   [name], MD  atorvastatin (LIPITOR) 40 MG tablet Take 1 tablet (40 mg total) by mouth daily. 07/22/18   [name], MD  benzonatate (TESSALON) 200 MG capsule Take 1 capsule (200 mg total) by mouth 3 (three) times daily as needed for cough. 11/25/18   [name], PA-C  DULoxetine (CYMBALTA) 60 MG capsule Take 120 mg by mouth daily.     [provider]  fluticasone (FLONASE) 50 MCG/ACT nasal spray Place 2 sprays into both nostrils daily. Patient taking differently: Place 2  sprays into both nostrils 2 (two) times daily.  03/15/15   [name], PA-C  Fluticasone-Salmeterol (ADVAIR) 250-50 MCG/DOSE AEPB Inhale 1 puff into the lungs 2 (two) times daily.    [provider]  hydrochlorothiazide (HYDRODIURIL) 25 MG tablet TAKE 1 TABLET BY MOUTH EVERY DAY 09/23/18   [name], PA-C  ipratropium-albuterol (DUONEB) 0.5-2.5 (3) MG/3ML SOLN TAKE 3 MLS BY NEBULIZATION EVERY 6 (SIX) HOURS AS NEEDED (SHORTNESS OF BREATH). 12/31/18   [name], PA-C  loratadine (CLARITIN) 10 MG tablet Take 1 tablet by mouth daily. 12/19/14   [provider]  losartan (COZAAR) 100 MG tablet TAKE 1 TABLET BY MOUTH EVERY DAY 09/24/18   [name], PA-C  medroxyPROGESTERone Acetate 150 MG/ML SUSY Inject 1 mL (150 mg total) into the muscle every 3 (three) months. 03/15/19   [name], PA-C  montelukast (SINGULAIR) 10 MG tablet Take 1 tablet by mouth daily. 12/19/14   [provider]  omeprazole (PRILOSEC) 20 MG capsule Take 1 capsule (20 mg total) by mouth daily. 03/21/15   [name], PA-C  Oxycodone HCl 10 MG TABS Take 1 tablet (10 mg total) by mouth every 6 (six) hours as needed. For severe pain 02/19/19   [name], PA-C  potassium citrate (UROCIT-K) 10 MEQ (1080 MG) SR tablet Take 1 tablet (10 mEq total) by mouth daily. 10/21/18   [name], PA-C  predniSONE (DELTASONE) 10 MG tablet Take 6 tabs PO on day 1&2, 5 tabs PO on day 3&4, 4 tabs PO on day 5&6, 3 tabs PO on day 7&8, 2 tabs PO on day 9&10, 1 tab PO on day 11&12. 11/25/18   [name], PA-C  zolpidem (AMBIEN) 10 MG tablet Take 10 mg by mouth at bedtime.  02/01/18   [provider]    Allergies Effexor [venlafaxine hcl] and Isosorbide nitrate  Family History  Problem Relation Age of Onset  . Stroke Paternal Grandmother   . Heart disease Paternal Grandmother   . Diabetes Maternal Grandmother   . Heart disease Maternal  Grandmother   . Hypertension Maternal Grandmother   . Heart disease Maternal Grandfather   . Hypertension Maternal Grandfather   . Heart disease Father   . Stroke Father   . Hypertension Father   . Hyperlipidemia Father   . Diabetes Mother   . Heart attack Mother     Social History Social History   Tobacco Use  . Smoking status: Former Smoker    Packs/day: 1.00    Years: 26.00    Pack years: 26.00    Types: Cigarettes    Quit date: 07/26/2013    Years since quitting: 5.6  . Smokeless tobacco: Never Used  Substance Use Topics  . Alcohol use: No    Alcohol/week: 0.0 standard drinks    Comment: Had a history of alcohol use but currently is  not drinking any  . Drug use: No    Review of Systems  Constitutional: No fever/chills Eyes: No visual changes. ENT: No sore throat. Cardiovascular: See HPI Respiratory: Denies shortness of breath. Gastrointestinal: No abdominal pain.  No nausea, no vomiting.  No diarrhea.  No constipation. Genitourinary: Negative for dysuria. Musculoskeletal: Negative for back pain. Skin: Negative for rash. Neurological: Negative for headaches, focal weakness   ____________________________________________   PHYSICAL EXAM:  VITAL SIGNS: ED Triage Vitals  Enc Vitals Group     BP 03/16/19 0228 (!) 143/67     Pulse Rate 03/16/19 0228 (!) 103     Resp 03/16/19 0228 (!) 22     Temp 03/16/19 0228 97.9 F (36.6 C)     Temp Source 03/16/19 0228 Oral     SpO2 03/16/19 0228 97 %     Weight 03/16/19 0226 180 lb (81.6 kg)     Height 03/16/19 0226 5\' 1"  (1.549 m)     Head Circumference --      Peak Flow --      Pain Score 03/16/19 0226 10     Pain Loc --      Pain Edu? --      Excl. in [place]? --     Constitutional: Alert and oriented. Well appearing and in no acute distress. Eyes: Conjunctivae are normal. Head: Atraumatic. Nose: No congestion/rhinnorhea. Mouth/Throat: Mucous membranes are moist.  Oropharynx non-erythematous. Neck: No stridor.    Cardiovascular: Normal rate, regular rhythm. Grossly normal heart sounds.  Good peripheral circulation. Respiratory: Normal respiratory effort.  No retractions. Lungs CTAB. Gastrointestinal: Soft tender to palpation epigastric area.  Palpation reproduces her pain exactly. No distention. No abdominal bruits. No CVA tenderness. Musculoskeletal: There is no edema.  There is normal capillary refill in both feet.  Both are feet are warm.  There is some pain in the groin.  I palpate the area but do not palpate any masses or lumps. Neurologic:  Normal speech and language. No gross focal neurologic deficits are appreciated.  Skin:  Skin is warm, dry and intact. No rash noted.   ____________________________________________   LABS (all labs ordered are listed, but only abnormal results are displayed)  Labs Reviewed  CBC WITH DIFFERENTIAL/PLATELET - Abnormal; Notable for the following components:      Result Value   WBC 11.3 (*)    Hemoglobin 11.3 (*)    HCT 33.1 (*)    RDW 16.1 (*)    Neutro Abs 9.5 (*)    All other components within normal limits  COMPREHENSIVE METABOLIC PANEL - Abnormal; Notable for the following components:   Potassium 3.0 (*)    Glucose, Bld 129 (*)    Calcium 8.7 (*)    All other components within normal limits  FIBRIN DERIVATIVES D-DIMER ([facility] ONLY)  TROPONIN I (HIGH SENSITIVITY)   ____________________________________________  EKG  EKG read and interpreted by me shows sinus tach rate of 102 normal axis there are flipped T's with ST segment depression in multiple leads.  These have been present in old EKGs. ____________________________________________  RADIOLOGY  ED MD interpretation: Chest x-ray read by radiology reviewed by me shows possible right apical density.  Pelvis x-ray read by radiology reviewed by me shows no acute disease  Official radiology report(s): Dg Pelvis 1-2 Views  Result Date: 03/16/2019 CLINICAL DATA:  Difficulty moving leg. EXAM: PELVIS  - 1-2 VIEW COMPARISON:  Lumbar spine 11/22/2014.  CT 06/01/2014. FINDINGS: Mild degenerative changes lumbar spine and both hips. No  acute bony or joint abnormality. No evidence of fracture or dislocation. Stable bone island right ilium. IMPRESSION: Mild degenerative change lumbar spine and both hips. No acute abnormality. Electronically Signed   By: Marcello Moores  Register   On: 03/16/2019 06:38   Dg Chest Portable 1 View  Result Date: 03/16/2019 CLINICAL DATA:  Left-sided chest pain. EXAM: PORTABLE CHEST 1 VIEW COMPARISON:  CT 06/04/2018. Chest x-ray 02/09/2018, 06/12/2017, 02/22/2017. FINDINGS: Mediastinum and hilar structures normal. Heart size normal. Questionable right apically density. Standard PA lateral chest x-ray suggested for further evaluation. Diffuse bilateral pulmonary interstitial prominence noted. No pleural effusion or pneumothorax. No acute bony abnormality identified. IMPRESSION: 1. Questionable right apical density. Standard PA and lateral chest x-ray suggested for further evaluation. 2. Bilateral interstitial prominence. Although interstitial prominence may be related to chronic interstitial lung disease, active interstitial process including pneumonitis cannot be excluded. Electronically Signed   By: Marcello Moores  Register   On: 03/16/2019 06:21    ____________________________________________   PROCEDURES  Procedure(s) performed (including Critical Care):  Procedures   ____________________________________________   INITIAL IMPRESSION / ASSESSMENT AND PLAN / ED COURSE   Ankle-brachial index was done.  Leg blood pressure was higher than the arm.  This is normal.  X-ray looks normal to.    X-ray of the pelvis looks normal and I do not palpate any masses in her groin with the pain is.  Not sure what is going on there.  I will have her follow-up with her regular doctor.  I will also have her follow-up with cardiology in case the epigastric pain is cardiac.          ____________________________________________   FINAL CLINICAL IMPRESSION(S) / ED DIAGNOSES  Final diagnoses:  Epigastric pain  Groin pain, left     ED Discharge Orders    None       Note:  This document was prepared using Dragon voice recognition software and may include unintentional dictation errors.    Nena Polio, MD 03/16/19 [phone]

## 2019-03-16 NOTE — ED Notes (Signed)
Pt resting.

## 2019-03-16 NOTE — Discharge Instructions (Signed)
Please use Tylenol as needed for the pain in your groin.  Please follow-up with your family doctor.  Please also follow-up with your cardiologist.  Give them a call and see if they can see you sooner than next month.  Please return for any increased pain, shortness of breath, fever, vomiting or feeling sicker.

## 2019-03-16 NOTE — ED Notes (Signed)
Patient states having left sided chest pain under breast, and having trouble moving left leg. Patient states having leg and feet swelling Saturday and then the left leg mobility and chest pain started yesterday.

## 2019-03-17 ENCOUNTER — Ambulatory Visit: Payer: Medicare Other | Admitting: Physician Assistant

## 2019-03-17 ENCOUNTER — Other Ambulatory Visit: Payer: Self-pay | Admitting: Physician Assistant

## 2019-03-17 DIAGNOSIS — I10 Essential (primary) hypertension: Secondary | ICD-10-CM

## 2019-03-23 ENCOUNTER — Other Ambulatory Visit: Payer: Self-pay | Admitting: Physician Assistant

## 2019-03-23 DIAGNOSIS — G8929 Other chronic pain: Secondary | ICD-10-CM

## 2019-03-23 DIAGNOSIS — M069 Rheumatoid arthritis, unspecified: Secondary | ICD-10-CM

## 2019-03-23 MED ORDER — OXYCODONE HCL 10 MG PO TABS: 10.0000 mg | ORAL_TABLET | Freq: Four times a day (QID) | ORAL | 0 refills | Status: DC | PRN

## 2019-03-23 NOTE — Telephone Encounter (Signed)
Pt needs a refill on her  Oxycodone 10 mg  [facility]  [place]

## 2019-03-24 ENCOUNTER — Other Ambulatory Visit: Payer: Self-pay | Admitting: Physician Assistant

## 2019-03-24 DIAGNOSIS — I10 Essential (primary) hypertension: Secondary | ICD-10-CM

## 2019-03-25 ENCOUNTER — Other Ambulatory Visit: Payer: Self-pay

## 2019-03-25 ENCOUNTER — Ambulatory Visit (INDEPENDENT_AMBULATORY_CARE_PROVIDER_SITE_OTHER): Payer: Medicare Other | Admitting: Physician Assistant

## 2019-03-25 DIAGNOSIS — Z3042 Encounter for surveillance of injectable contraceptive: Secondary | ICD-10-CM | POA: Diagnosis not present

## 2019-03-25 MED ORDER — MEDROXYPROGESTERONE ACETATE 150 MG/ML IM SUSP
150.0000 mg | Freq: Once | INTRAMUSCULAR | Status: AC
  Administered 2019-03-25: 150 mg via INTRAMUSCULAR

## 2019-03-25 NOTE — Progress Notes (Signed)
Patient here for Depo Provera injection. Patient given Depo Provera in left Ventrogluteal. Patient tolerated well.Patient is to return back between [place]

## 2019-03-30 ENCOUNTER — Ambulatory Visit: Payer: Self-pay

## 2019-04-12 ENCOUNTER — Other Ambulatory Visit: Payer: Self-pay

## 2019-04-12 DIAGNOSIS — I10 Essential (primary) hypertension: Secondary | ICD-10-CM

## 2019-04-12 MED ORDER — AMLODIPINE BESYLATE 5 MG PO TABS: 5.0000 mg | ORAL_TABLET | Freq: Every day | ORAL | 3 refills | Status: DC

## 2019-04-22 ENCOUNTER — Other Ambulatory Visit: Payer: Self-pay | Admitting: Physician Assistant

## 2019-04-22 DIAGNOSIS — M069 Rheumatoid arthritis, unspecified: Secondary | ICD-10-CM

## 2019-04-22 DIAGNOSIS — G8929 Other chronic pain: Secondary | ICD-10-CM

## 2019-04-22 MED ORDER — OXYCODONE HCL 10 MG PO TABS: 10.0000 mg | ORAL_TABLET | Freq: Four times a day (QID) | ORAL | 0 refills | Status: DC | PRN

## 2019-04-22 NOTE — Telephone Encounter (Signed)
Pt needs refill on her  Oxycodone 10 mg  [facility]

## 2019-04-26 ENCOUNTER — Other Ambulatory Visit: Payer: Self-pay

## 2019-04-26 ENCOUNTER — Ambulatory Visit (INDEPENDENT_AMBULATORY_CARE_PROVIDER_SITE_OTHER): Payer: Medicare Other | Admitting: Cardiology

## 2019-04-26 VITALS — BP 128/74 | HR 99 | Temp 97.9°F | Ht 61.0 in | Wt 199.4 lb

## 2019-04-26 DIAGNOSIS — Z9989 Dependence on other enabling machines and devices: Secondary | ICD-10-CM

## 2019-04-26 DIAGNOSIS — I10 Essential (primary) hypertension: Secondary | ICD-10-CM | POA: Diagnosis not present

## 2019-04-26 DIAGNOSIS — I2584 Coronary atherosclerosis due to calcified coronary lesion: Secondary | ICD-10-CM

## 2019-04-26 DIAGNOSIS — G4733 Obstructive sleep apnea (adult) (pediatric): Secondary | ICD-10-CM | POA: Diagnosis not present

## 2019-04-26 DIAGNOSIS — R079 Chest pain, unspecified: Secondary | ICD-10-CM

## 2019-04-26 DIAGNOSIS — I2510 Atherosclerotic heart disease of native coronary artery without angina pectoris: Secondary | ICD-10-CM

## 2019-04-26 DIAGNOSIS — R9431 Abnormal electrocardiogram [ECG] [EKG]: Secondary | ICD-10-CM

## 2019-04-26 NOTE — Progress Notes (Signed)
Cardiology Office Note:    Date:  04/26/2019   ID:  Susan Gilmore, DOB 10/11/1965, MRN [id number]  PCP:  [name], PA-C  Cardiologist:  [name], MD PhD  Referring MD: [name]*   CC: follow up  History of Present Illness:    Susan Gilmore is a 53 y.o. female with a hx of rheumatoid arthritis, COPD who is seen in follow up. She was initially seen as a new consult on 04/24/18.   Cardiac history: initially seen in consult for elevated BNP (no echo). However, noted chest discomfort at that time. Nonexertional but dull, hadn't occurred recently. She was very short of breath. Had been a heavy smoker (2-3 ppd) but changed to vaping. Had risk factors for sleep apnea, was ordered for sleep study and now pending CPAP.  Remotely, was seen by Dr. [name] in 2012, reports having a normal cath (I can see the Ao pressures but not the anatomy on the uploaded document). She also saw Dr. [name] in 2013.  She reports a history of bigeminy (found in 2012-2013 incidentally). Rare issue for her, notices it when she lays down to sleep. Computer notes atrial fib/flutter in the past (she is not aware of this diagnosis, never on blood thinners). I personally reviewed all of her ECGs in the system and cannot find evidence of this. Had coronary CTA, was 99th percentile in calcium score.  Today: Seen in ER at [facility] 03/16/19 with aching pain that was worse with deep breathing or movement. HSTn ruled out ACS. ECG was sinus tach with ST depressions/TWI in multiple leads. This was similar to prior ECG from 04/24/18. She wasn't told what might be the cause, she thinks it was her arthritis.   Has occasional chest tightness, mild, happens very seldomly. Takes a deep breath and relaxes, then it goes away. Sometimes feels like it goes up her neck. Hangs around for a minute or two at most. Still under a lot of stress with her husband's recovery from stroke. Tearful today, doesn't get time  to take care of herself. He doesn't have insurance, doesn't have home health, etc. She is still vaping but hasn't gone back to smoking.   Denies shortness of breath at rest or with normal exertion. No PND, orthopnea, LE edema or unexpected weight gain. No syncope or palpitations.  Past Medical History:  Diagnosis Date  . Abnormal Pap smear 04/28/2012   normal pap /positive hpv   . Abnormal stress test   . Allergic rhinitis   . Bipolar disorder ([place])   . Carpal tunnel syndrome   . Chest pain   . Chronic anxiety   . Contraception   . Cough   . HPV (human papilloma virus) anogenital infection   . HTN (hypertension)   . Hyperplastic colon polyp   . Insomnia   . Palpitations   . Pneumonia     Past Surgical History:  Procedure Laterality Date  . full mouth dental    . GANGLION CYST EXCISION    . WISDOM TOOTH EXTRACTION      Current Medications: Current Outpatient Medications on File Prior to Visit  Medication Sig  . albuterol (PROAIR HFA) 108 (90 Base) MCG/ACT inhaler Inhale 1-2 puffs into the lungs every 6 (six) hours as needed for wheezing or shortness of breath.  . ALPRAZolam (XANAX) 1 MG tablet Take 1 mg by mouth 4 (four) times daily as needed for anxiety.   [name] amLODipine (NORVASC) 5 MG tablet Take  1 tablet (5 mg total) by mouth daily.  [name] amphetamine-dextroamphetamine (ADDERALL XR) 30 MG 24 hr capsule Take 60 mg by mouth daily.  [name] aspirin EC 81 MG tablet Take 1 tablet (81 mg total) by mouth daily.  [name] atorvastatin (LIPITOR) 40 MG tablet Take 1 tablet (40 mg total) by mouth daily.  . DULoxetine (CYMBALTA) 60 MG capsule Take 120 mg by mouth daily.   . fluticasone (FLONASE) 50 MCG/ACT nasal spray Place 2 sprays into both nostrils daily. (Patient taking differently: Place 2 sprays into both nostrils 2 (two) times daily. )  . Fluticasone-Salmeterol (ADVAIR) 250-50 MCG/DOSE AEPB Inhale 1 puff into the lungs 2 (two) times daily.  . hydrochlorothiazide (HYDRODIURIL) 25 MG tablet TAKE 1  TABLET BY MOUTH EVERY DAY  . ipratropium-albuterol (DUONEB) 0.5-2.5 (3) MG/3ML SOLN TAKE 3 MLS BY NEBULIZATION EVERY 6 (SIX) HOURS AS NEEDED (SHORTNESS OF BREATH).  [name] loratadine (CLARITIN) 10 MG tablet Take 1 tablet by mouth daily as needed for allergies.   [name] losartan (COZAAR) 100 MG tablet TAKE 1 TABLET BY MOUTH EVERY DAY  . medroxyPROGESTERone Acetate 150 MG/ML SUSY Inject 1 mL (150 mg total) into the muscle every 3 (three) months.  . montelukast (SINGULAIR) 10 MG tablet Take 1 tablet by mouth daily as needed.   [name] omeprazole (PRILOSEC) 20 MG capsule Take 1 capsule (20 mg total) by mouth daily.  . Oxycodone HCl 10 MG TABS Take 1 tablet (10 mg total) by mouth every 6 (six) hours as needed. For severe pain  . potassium citrate (UROCIT-K) 10 MEQ (1080 MG) SR tablet Take 1 tablet (10 mEq total) by mouth daily.  [name] zolpidem (AMBIEN) 10 MG tablet Take 10 mg by mouth at bedtime.    No current facility-administered medications on file prior to visit.      Allergies:   Effexor [venlafaxine hcl] and Isosorbide nitrate   Social History   Socioeconomic History  . Marital status: Married    Spouse name: Not on file  . Number of children: 1  . Years of education: Not on file  . Highest education level: 10th grade  Occupational History  . Occupation: disability  Social Needs  . Financial resource strain: Somewhat hard  . Food insecurity    Worry: Never true    Inability: Never true  . Transportation needs    Medical: No    Non-medical: No  Tobacco Use  . Smoking status: Former Smoker    Packs/day: 1.00    Years: 26.00    Pack years: 26.00    Types: Cigarettes    Quit date: 07/26/2013    Years since quitting: 5.7  . Smokeless tobacco: Never Used  Substance and Sexual Activity  . Alcohol use: No    Alcohol/week: 0.0 standard drinks    Comment: Had a history of alcohol use but currently is not drinking any  . Drug use: No  . Sexual activity: Yes    Birth control/protection: Injection     Comment: depo  Lifestyle  . Physical activity    Days per week: Not on file    Minutes per session: Not on file  . Stress: Only a little  Relationships  . Social Herbalist on phone: Not on file    Gets together: Not on file    Attends religious service: Not on file    Active member of club or organization: Not on file    Attends meetings of clubs or organizations: Not on file  Relationship status: Not on file  Other Topics Concern  . Not on file  Social History Narrative   The patient is married. She has one stepson. She does not work outside the home. She smokes 1-1/2-2 packs of cigarettes daily.     Family History: The patient's family history includes Diabetes in her maternal grandmother and mother; Heart attack in her mother; Heart disease in her father, maternal grandfather, maternal grandmother, and paternal grandmother; Hyperlipidemia in her father; Hypertension in her father, maternal grandfather, and maternal grandmother; Stroke in her father and paternal grandmother.  ROS:   Please see the history of present illness.  Additional pertinent ROS: Constitutional: Negative for chills, fever, night sweats, unintentional weight loss  HENT: Negative for ear pain and hearing loss.   Eyes: Negative for loss of vision and eye pain.  Respiratory: Negative for cough, sputum, wheezing.   Cardiovascular: See HPI. Gastrointestinal: Negative for abdominal pain, melena, and hematochezia.  Genitourinary: Negative for dysuria and hematuria.  Musculoskeletal: Negative for falls and myalgias.  Skin: Negative for itching and rash.  Neurological: Negative for focal weakness, focal sensory changes and loss of consciousness.  Endo/Heme/Allergies: Does not bruise/bleed easily.   EKGs/Labs/Other Studies Reviewed:    The following studies were reviewed today: Echo 05/04/18 - Left ventricle: The cavity size was normal. 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 Systolic function was normal. The   estimated ejection fraction was in the range of 60% to 65%. Wall   motion was normal; there were no regional wall motion   abnormalities. Doppler parameters are consistent with abnormal   left ventricular relaxation (grade 1 diastolic dysfunction). - Aortic valve: Poorly visualized. Probably trileaflet; mildly   calcified leaflets. There was no stenosis. - Mitral valve: There was no significant regurgitation. - Right ventricle: The cavity size was normal. Systolic function   was normal. - Pulmonary arteries: No complete TR doppler jet so unable to   estimate PA systolic pressure. - Inferior vena cava: The vessel was normal in size. The   respirophasic diameter changes were in the normal range (>= 50%),   consistent with normal central venous pressure.  Impressions: - Normal LV size with prominent LV hypertrophy towards the apex.   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 EF 60-65%. Normal RV size and   systolic function. No significant valvular abnormalities.  CTA coronary 06/04/18 IMPRESSION: 1. Coronary calcium score of 210. This was 50 percentile for age and sex matched control.  2. Normal coronary origin with right dominance.  3. Moderate stenosis in the mid RCA and LAD. Additional analysis with CT FFR will be submitted.  1. Left Main:  No significant stenosis. 2. LAD: No significant stenosis. 3. LCX: No significant stenosis. 4. RCA: No significant stenosis.  IMPRESSION: 1. CT FFR analysis didn't show any significant stenosis. Aggressive medical management for non-obstructive CAD is recommended.   EKG:  EKG is personally reviewed today.  The ekg ordered 03/16/19 demonstrates sinus tachycardia at 102 bpm with significant (though unchanged) ST changes, most notably ST depressions in multiple leads.  Recent Labs: 03/16/2019:  ALT 23; BUN 16; Creatinine, Ser 0.83; Hemoglobin 11.3; Platelets 226; Potassium 3.0; Sodium 136  Recent Lipid Panel    Component Value Date/Time   CHOL 84 (L) 10/23/2018 1127   CHOL 166 06/22/2014 1033   TRIG 74 10/23/2018 1127   TRIG 175 06/22/2014 1033  HDL 31 (L) 10/23/2018 1127   HDL 39 (L) 06/22/2014 1033   CHOLHDL 2.7 10/23/2018 1127   VLDL 35 06/22/2014 1033   LDLCALC 38 10/23/2018 1127   LDLCALC 92 06/22/2014 1033    Physical Exam:    VS:  BP 128/74   Pulse 99   Temp 97.9 F (36.6 C)   Ht 5\' 1"  (1.549 m)   Wt 199 lb 6.4 oz (90.4 kg)   BMI 37.68 kg/m     Wt Readings from Last 3 Encounters:  04/26/19 199 lb 6.4 oz (90.4 kg)  03/16/19 180 lb (81.6 kg)  11/25/18 190 lb 12.8 oz (86.5 kg)    GEN: Well nourished, well developed in no acute distress HEENT: Normal, moist mucous membranes NECK: No JVD CARDIAC: regular rhythm, normal S1 and S2, no murmurs, rubs, gallops.  VASCULAR: Radial and DP pulses 2+ bilaterally. No carotid bruits RESPIRATORY:  Clear to auscultation without rales, wheezing or rhonchi  ABDOMEN: Soft, non-tender, non-distended MUSCULOSKELETAL:  Ambulates independently SKIN: Warm and dry, no edema NEUROLOGIC:  Alert and oriented x 3. No focal neuro deficits noted. PSYCHIATRIC:  Normal affect   ASSESSMENT:    1. Coronary artery disease due to calcified coronary lesion   2. Chest pain, unspecified type   3. Essential hypertension   4. OSA on CPAP   5. Abnormal EKG    PLAN:    Chest pain, abnormal EKG--> coronary CTA with diffuse calcification, 99th percentile, with two localized lesions (FFR WNL). Consistent with coronary artery disease -aggressive prevention.  -tolerating atorvastatin 40 mg.  -LDL went from 85 to 38 on atorvastatin 10/23/18, LFTs normal -has severe stress in her life. Spent time discussing this today. 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 Offered to have her  speak to our Education officer, museum. She doesn't have specific needs now but will let us know if we can be of assistance in the future. -reviewed red flag warning signs that need immediate medical attention -would benefit from stopping vaping, but she feels this is keeping her from cigarettes right now -continue aspirin 81 mg. -no obstructive disease on CT, not ACS during recent ER visit.  Shortness of breath: stable to improved today -elevated BNP on testing, but no echo at that time. Also has history of COPD, prior significant tobacco use and currently vapes.  -diastolic dysfunction on echo -also noted to have sleep apnea, continue CPAP. Benefitting from this therapy.  Hypertension -at goal today -continue amlodipine 5 mg, losartan 100 mg daily -next med would be spironolactone given diastolic dysfunction. With her underlying lung disease, would avoid beta blocker, though this may also be beneficial for CAD  Secondary Prevention: counseling, review of risk factors -recommend heart healthy/Mediterranean diet, with whole grains, fruits, vegetable, fish, lean meats, nuts, and olive oil. Limit salt. -recommend moderate walking, 3-5 times/week for 30-50 minutes each session. Aim for at least 150 minutes.week. Goal should be pace of 3 miles/hours, or walking 1.5 miles in 30 minutes -recommend avoidance of tobacco products. Avoid excess alcohol.  Plan for follow up: 6 mos or sooner PRN.  Medication Adjustments/Labs and Tests Ordered: Current medicines are reviewed at length with the patient today.  Concerns regarding medicines are outlined above.  No orders of the defined types were placed in this encounter.  No orders of the defined types were placed in this encounter.   Patient Instructions  Medication Instructions:  Your Physician recommend  you continue on your current medication as directed.    If you need a refill on your cardiac medications before your next appointment, please call your  pharmacy.   Lab work: None  Testing/Procedures: None  Follow-Up: At [facility], you and your health needs are our priority.  As part of our continuing mission to provide you with exceptional heart care, we have created designated Provider Care Teams.  These Care Teams include your primary Cardiologist (physician) and Advanced Practice Providers (APPs -  Physician Assistants and Nurse Practitioners) who all work together to provide you with the care you need, when you need it. You will need a follow up appointment in 6 months.  Please call our office 2 months in advance to schedule this appointment.  You may see [name], MD or one of the following Advanced Practice Providers on your designated Care Team:   Rosaria Ferries, PA-C . Jory Sims, DNP, ANP     Signed, [name], MD PhD 04/26/2019  [facility]

## 2019-04-26 NOTE — Patient Instructions (Signed)

## 2019-05-03 ENCOUNTER — Encounter: Payer: Self-pay | Admitting: Cardiology

## 2019-05-03 DIAGNOSIS — Z9989 Dependence on other enabling machines and devices: Secondary | ICD-10-CM | POA: Insufficient documentation

## 2019-05-03 DIAGNOSIS — G4733 Obstructive sleep apnea (adult) (pediatric): Secondary | ICD-10-CM | POA: Insufficient documentation

## 2019-05-21 ENCOUNTER — Other Ambulatory Visit: Payer: Self-pay

## 2019-05-21 DIAGNOSIS — G8929 Other chronic pain: Secondary | ICD-10-CM

## 2019-05-21 DIAGNOSIS — M069 Rheumatoid arthritis, unspecified: Secondary | ICD-10-CM

## 2019-05-21 MED ORDER — OXYCODONE HCL 10 MG PO TABS: 10.0000 mg | ORAL_TABLET | Freq: Four times a day (QID) | ORAL | 0 refills | Status: DC | PRN

## 2019-05-21 NOTE — Telephone Encounter (Signed)
Patient called requesting a refill on her Oxycodone 10MG . L.O.V. was on 11/25/2018 and no upcoming appointment, please advise.

## 2019-06-14 ENCOUNTER — Ambulatory Visit (INDEPENDENT_AMBULATORY_CARE_PROVIDER_SITE_OTHER): Payer: Medicare Other | Admitting: Physician Assistant

## 2019-06-14 ENCOUNTER — Telehealth: Payer: Self-pay

## 2019-06-14 ENCOUNTER — Other Ambulatory Visit: Payer: Self-pay

## 2019-06-14 DIAGNOSIS — J440 Chronic obstructive pulmonary disease with acute lower respiratory infection: Secondary | ICD-10-CM

## 2019-06-14 DIAGNOSIS — Z3042 Encounter for surveillance of injectable contraceptive: Secondary | ICD-10-CM

## 2019-06-14 DIAGNOSIS — J209 Acute bronchitis, unspecified: Secondary | ICD-10-CM

## 2019-06-14 MED ORDER — MEDROXYPROGESTERONE ACETATE 150 MG/ML IM SUSP
150.0000 mg | Freq: Once | INTRAMUSCULAR | Status: AC
  Administered 2019-06-14: 150 mg via INTRAMUSCULAR

## 2019-06-14 MED ORDER — BENZONATATE 200 MG PO CAPS: 200.0000 mg | ORAL_CAPSULE | Freq: Three times a day (TID) | ORAL | 0 refills | Status: DC | PRN

## 2019-06-14 MED ORDER — PREDNISONE 10 MG PO TABS: ORAL_TABLET | ORAL | 0 refills | Status: DC

## 2019-06-14 NOTE — Telephone Encounter (Signed)
Sent to [facility] [address]

## 2019-06-14 NOTE — Telephone Encounter (Signed)
Patient was seen in office this morning for Depo injection and requested that her PCP send her in prescriptions to help COPD symptoms. Patient particularly asked for prednisone and prescription cough syrup, patient states that she has had cough and wheezing x1 week and has been using prescription inhalers and otc cough syrup with no relief. Patient [facility] [address]. Patients call back number for any questions is [phone]. Please advise. KW

## 2019-06-18 ENCOUNTER — Other Ambulatory Visit: Payer: Self-pay

## 2019-06-18 DIAGNOSIS — M069 Rheumatoid arthritis, unspecified: Secondary | ICD-10-CM

## 2019-06-18 DIAGNOSIS — G8929 Other chronic pain: Secondary | ICD-10-CM

## 2019-06-18 MED ORDER — OXYCODONE HCL 10 MG PO TABS: 10.0000 mg | ORAL_TABLET | Freq: Four times a day (QID) | ORAL | 0 refills | Status: DC | PRN

## 2019-06-23 ENCOUNTER — Other Ambulatory Visit: Payer: Self-pay | Admitting: Physician Assistant

## 2019-06-23 DIAGNOSIS — J9621 Acute and chronic respiratory failure with hypoxia: Secondary | ICD-10-CM

## 2019-06-23 DIAGNOSIS — J4551 Severe persistent asthma with (acute) exacerbation: Secondary | ICD-10-CM

## 2019-07-19 ENCOUNTER — Telehealth: Payer: Self-pay | Admitting: Physician Assistant

## 2019-07-19 DIAGNOSIS — M069 Rheumatoid arthritis, unspecified: Secondary | ICD-10-CM

## 2019-07-19 DIAGNOSIS — G8929 Other chronic pain: Secondary | ICD-10-CM

## 2019-07-19 MED ORDER — OXYCODONE HCL 10 MG PO TABS: 10.0000 mg | ORAL_TABLET | Freq: Four times a day (QID) | ORAL | 0 refills | Status: DC | PRN

## 2019-07-19 NOTE — Telephone Encounter (Signed)
Pt request refill    Oxycodone HCl 10 MG TABS   [id number] - [place], [place] - [address] [phone] (Phone) [phone] (Fax)

## 2019-08-13 ENCOUNTER — Other Ambulatory Visit: Payer: Self-pay | Admitting: Physician Assistant

## 2019-08-13 DIAGNOSIS — M069 Rheumatoid arthritis, unspecified: Secondary | ICD-10-CM

## 2019-08-13 DIAGNOSIS — G8929 Other chronic pain: Secondary | ICD-10-CM

## 2019-08-13 MED ORDER — OXYCODONE HCL 10 MG PO TABS: 10.0000 mg | ORAL_TABLET | Freq: Four times a day (QID) | ORAL | 0 refills | Status: DC | PRN

## 2019-08-13 NOTE — Telephone Encounter (Signed)
Pt request refill   Oxycodone HCl 10 MG TABS  [id number] - [place], [place] - [address] Phone:  [phone]  Fax:  [phone]

## 2019-08-13 NOTE — Telephone Encounter (Signed)
Requested medication (s) are due for refill today: yes  Requested medication (s) are on the active medication list: yes  Last refill:  07/19/2019  Future visit scheduled: no  Notes to clinic:  refill cannot be delegated    Requested Prescriptions  Pending Prescriptions Disp Refills   Oxycodone HCl 10 MG TABS 120 tablet 0    Sig: Take 1 tablet (10 mg total) by mouth every 6 (six) hours as needed. For severe pain      Not Delegated - Analgesics:  Opioid Agonists Failed - 08/13/2019  8:34 AM      Failed - This refill cannot be delegated      Failed - Urine Drug Screen completed in last 360 days.      Failed - Valid encounter within last 6 months    Recent Outpatient Visits           8 months ago COPD with acute exacerbation [facility])   [place], [place]   1 year ago Acute cystitis without hematuria   [place], [place], [place]   1 year ago Encounter for surveillance of injectable contraceptive   [place], PA-C   1 year ago Elevated brain natriuretic peptide (BNP) level   [facility], [place], [place]   1 year ago Essential hypertension   [place], [place], [place]

## 2019-08-23 NOTE — Progress Notes (Signed)
Subjective:   Susan Gilmore is a 53 y.o. female who presents for Medicare Annual (Subsequent) preventive examination.    This visit is being conducted through telemedicine due to the COVID-19 pandemic. This patient has given me verbal consent via doximity to conduct this visit, patient states they are participating from their home address. Some vital signs may be absent or patient reported.    Patient identification: identified by name, DOB, and current address  Review of Systems:  N/A  Cardiac Risk Factors include: dyslipidemia;hypertension;smoking/ tobacco exposure;obesity (BMI >30kg/m2)     Objective:     Vitals: There were no vitals taken for this visit.  There is no height or weight on file to calculate BMI. Unable to obtain vitals due to visit being conducted via telephonically.   Advanced Directives 08/24/2019 03/16/2019 07/28/2018 05/25/2018 02/16/2018 02/28/2017 02/23/2017  Does Patient Have a Medical Advance Directive? No No No No No No No  Would patient like information on creating a medical advance directive? No - Patient declined No - Patient declined Yes (MAU/Ambulatory/Procedural Areas - Information given) No - Patient declined No - Patient declined No - Patient declined No - Patient declined    Tobacco Social History   Tobacco Use  Smoking Status Former Smoker  . Packs/day: 1.00  . Years: 26.00  . Pack years: 26.00  . Types: Cigarettes  . Quit date: 07/26/2013  . Years since quitting: 6.0  Smokeless Tobacco Never Used     Counseling given: Not Answered   Clinical Intake:  Pre-visit preparation completed: Yes  Pain : 0-10 Pain Score: 6  Pain Type: Chronic pain(due to RA) Pain Location: (all over) Pain Descriptors / Indicators: Aching Pain Frequency: Constant     Nutritional Risks: None Diabetes: No  How often do you need to have someone help you when you read instructions, pamphlets, or other written materials from your doctor or pharmacy?: 1 -  Never  Interpreter Needed?: No  Information entered by :: [facility], LPN  Past Medical History:  Diagnosis Date  . Abnormal Pap smear 04/28/2012   normal pap /positive hpv   . Abnormal stress test   . Allergic rhinitis   . Bipolar disorder ([place])   . Carpal tunnel syndrome   . Chest pain   . Chronic anxiety   . Contraception   . Cough   . HPV (human papilloma virus) anogenital infection   . HTN (hypertension)   . Hyperplastic colon polyp   . Insomnia   . Palpitations   . Pneumonia    Past Surgical History:  Procedure Laterality Date  . full mouth dental    . GANGLION CYST EXCISION    . WISDOM TOOTH EXTRACTION     Family History  Problem Relation Age of Onset  . Stroke Paternal Grandmother   . Heart disease Paternal Grandmother   . Diabetes Maternal Grandmother   . Heart disease Maternal Grandmother   . Hypertension Maternal Grandmother   . Heart disease Maternal Grandfather   . Hypertension Maternal Grandfather   . Heart disease Father   . Stroke Father   . Hypertension Father   . Hyperlipidemia Father   . Diabetes Mother   . Heart attack Mother    Social History   Socioeconomic History  . Marital status: Married    Spouse name: Not on file  . Number of children: 1  . Years of education: Not on file  . Highest education level: 10th grade  Occupational History  . Occupation:  disability  Tobacco Use  . Smoking status: Former Smoker    Packs/day: 1.00    Years: 26.00    Pack years: 26.00    Types: Cigarettes    Quit date: 07/26/2013    Years since quitting: 6.0  . Smokeless tobacco: Never Used  Substance and Sexual Activity  . Alcohol use: No    Alcohol/week: 0.0 standard drinks    Comment: Had a history of alcohol use but currently is not drinking any  . Drug use: No  . Sexual activity: Yes    Birth control/protection: Injection    Comment: depo  Other Topics Concern  . Not on file  Social History Narrative   The patient is married. She has one  stepson. She does not work outside the home.    Social Determinants of Health   Financial Resource Strain: Low Risk   . Difficulty of Paying Living Expenses: Not hard at all  Food Insecurity: No Food Insecurity  . Worried About Charity fundraiser in the Last Year: Never true  . Ran Out of Food in the Last Year: Never true  Transportation Needs: No Transportation Needs  . Lack of Transportation (Medical): No  . Lack of Transportation (Non-Medical): No  Physical Activity: Inactive  . Days of Exercise per Week: 0 days  . Minutes of Exercise per Session: 0 min  Stress: Stress Concern Present  . Feeling of Stress : Very much  Social Connections: Slightly Isolated  . Frequency of Communication with Friends and Family: More than three times a week  . Frequency of Social Gatherings with Friends and Family: More than three times a week  . Attends Religious Services: More than 4 times per year  . Active Member of Clubs or Organizations: No  . Attends Archivist Meetings: Never  . Marital Status: Married    Outpatient Encounter Medications as of 08/24/2019  Medication Sig  . albuterol (PROAIR HFA) 108 (90 Base) MCG/ACT inhaler Inhale 1-2 puffs into the lungs every 6 (six) hours as needed for wheezing or shortness of breath.  . ALPRAZolam (XANAX) 1 MG tablet Take 1 mg by mouth 4 (four) times daily as needed for anxiety.   [name] amLODipine (NORVASC) 5 MG tablet Take 1 tablet (5 mg total) by mouth daily.  [name] amphetamine-dextroamphetamine (ADDERALL XR) 30 MG 24 hr capsule Take 60 mg by mouth daily.  [name] aspirin EC 81 MG tablet Take 1 tablet (81 mg total) by mouth daily.  [name] atorvastatin (LIPITOR) 40 MG tablet Take 1 tablet (40 mg total) by mouth daily.  . benzonatate (TESSALON) 200 MG capsule Take 1 capsule (200 mg total) by mouth 3 (three) times daily as needed.  . DULoxetine (CYMBALTA) 60 MG capsule Take 120 mg by mouth daily.   . fluticasone (FLONASE) 50 MCG/ACT nasal spray Place 2 sprays  into both nostrils daily. (Patient taking differently: Place 2 sprays into both nostrils 2 (two) times daily. )  . hydrochlorothiazide (HYDRODIURIL) 25 MG tablet TAKE 1 TABLET BY MOUTH EVERY DAY  . ipratropium-albuterol (DUONEB) 0.5-2.5 (3) MG/3ML SOLN TAKE 3 MLS BY NEBULIZATION EVERY 6 (SIX) HOURS AS NEEDED (SHORTNESS OF BREATH).  [name] loratadine (CLARITIN) 10 MG tablet Take 1 tablet by mouth daily as needed for allergies.   [name] losartan (COZAAR) 100 MG tablet TAKE 1 TABLET BY MOUTH EVERY DAY  . medroxyPROGESTERone Acetate 150 MG/ML SUSY Inject 1 mL (150 mg total) into the muscle every 3 (three) months.  . montelukast (SINGULAIR) 10  MG tablet Take 1 tablet by mouth daily as needed.   [name] omeprazole (PRILOSEC) 20 MG capsule Take 1 capsule (20 mg total) by mouth daily. (Patient taking differently: Take 20 mg by mouth daily. As needed)  . Oxycodone HCl 10 MG TABS Take 1 tablet (10 mg total) by mouth every 6 (six) hours as needed. For severe pain  . zolpidem (AMBIEN) 10 MG tablet Take 10 mg by mouth at bedtime.   . Fluticasone-Salmeterol (ADVAIR) 250-50 MCG/DOSE AEPB Inhale 1 puff into the lungs 2 (two) times daily.  . potassium citrate (UROCIT-K) 10 MEQ (1080 MG) SR tablet Take 1 tablet (10 mEq total) by mouth daily. (Patient not taking: Reported on 08/24/2019)  . predniSONE (DELTASONE) 10 MG tablet Take 6 tablets PO on day 1 and day 2, take 5 tablets PO on day 3 and day 4, take 4 tablets PO on day 5 and day 6, take 3 tablets PO on day 7 and day 8, take 2 tablets PO on day 9 and day 10, take one tablet PO on day 11 and day 12. (Patient not taking: Reported on 08/24/2019)   No facility-administered encounter medications on file as of 08/24/2019.    Activities of Daily Living In your present state of health, do you have any difficulty performing the following activities: 08/24/2019  Hearing? N  Vision? N  Difficulty concentrating or making decisions? N  Walking or climbing stairs? Y  Comment Due to RA in  knees and SOB.  Dressing or bathing? N  Doing errands, shopping? N  Preparing Food and eating ? N  Using the Toilet? N  In the past six months, have you accidently leaked urine? Y  Comment Rarely when coughing.  Do you have problems with loss of bowel control? N  Managing your Medications? N  Managing your Finances? N  Housekeeping or managing your Housekeeping? N  Some recent data might be hidden    Patient Care Team: Rubye Beach as PCP - General (Physician Assistant) [name], MD as PCP - Cardiology (Cardiology) Chucky May, MD as Consulting Physician (Psychiatry)    Assessment:   This is a routine wellness examination for Susan Gilmore.  Exercise Activities and Dietary recommendations Current Exercise Habits: The patient does not participate in regular exercise at present, Exercise limited by: orthopedic condition(s);respiratory conditions(s)  Goals    . DIET - DECREASE SODA OR JUICE INTAKE     Recommend decreasing amount of soda by half and not exceeding 3 cans a day.     . Increase water intake     Recommend increasing water intake to 4-6 glasses a day.        Fall Risk: Fall Risk  08/24/2019 02/16/2018 06/12/2017 02/13/2017 03/06/2016  Falls in the past year? 0 Yes Yes No No  Number falls in past yr: 0 1 1 - -  Injury with Fall? 0 Yes Yes - -  Comment - sprained ankle - - -  Follow up - Falls prevention discussed - - -    FALL RISK PREVENTION PERTAINING TO THE HOME:  Any stairs in or around the home? Yes  If so, are there any without handrails? No   Home free of loose throw rugs in walkways, pet beds, electrical cords, etc? Yes  Adequate lighting in your home to reduce risk of falls? Yes   ASSISTIVE DEVICES UTILIZED TO PREVENT FALLS:  Life alert? No  Use of a cane, walker or w/c? Yes  Grab bars in the  bathroom? No  Shower chair or bench in shower? Yes  Elevated toilet seat or a handicapped toilet? No    TIMED UP AND GO:  Was  the test performed? No .    Depression Screen PHQ 2/9 Scores 08/24/2019 08/24/2019 02/16/2018 02/13/2017  PHQ - 2 Score 0 0 0 4  PHQ- 9 Score - - - 8     Cognitive Function: Declined today.      6CIT Screen 02/13/2017  What Year? 0 points  What month? 0 points  What time? 0 points  Count back from 20 0 points  Months in reverse 0 points  Repeat phrase 10 points  Total Score 10    Immunization History  Administered Date(s) Administered  . Influenza, Seasonal, Injecte, Preservative Fre 06/04/2017  . Influenza,inj,Quad PF,6+ Mos 06/05/2015, 07/19/2019  . Influenza-Unspecified 06/01/2014, 06/01/2018  . Pneumococcal Conjugate-13 06/05/2015  . Pneumococcal Polysaccharide-23 07/17/2012  . Td 07/12/2004  . Tdap 09/28/2010    Qualifies for Shingles Vaccine? No    Tdap: Up to date  Flu Vaccine: Up to date   Screening Tests Health Maintenance  Topic Date Due  . MAMMOGRAM  09/21/2017  . COLONOSCOPY  09/21/2017  . PAP SMEAR-Modifier  03/05/2018  . TETANUS/TDAP  09/28/2020  . INFLUENZA VACCINE  Completed  . HIV Screening  Completed    Cancer Screenings:  Colorectal Screening: Currently due. Referral to GI placed today. Pt aware the office will call re: appt.  Mammogram: Currently due. Ordered today. Pt provided with contact info and advised to call to schedule appt. Pt aware the office will call re: appt.  Lung Cancer Screening: (Low Dose CT Chest recommended if Age 85-80 years, 30 pack-year currently smoking OR have quit w/in 15years.) does not qualify.   Additional Screening:  Vision Screening: Recommended annual ophthalmology exams for early detection of glaucoma and other disorders of the eye.  Dental Screening: Recommended annual dental exams for proper oral hygiene  Community Resource Referral:  CRR required this visit?  No       Plan:  I have personally reviewed and addressed the Medicare Annual Wellness questionnaire and have noted the following in the  patient's chart:  A. Medical and social history B. Use of alcohol, tobacco or illicit drugs  C. Current medications and supplements D. Functional ability and status E.  Nutritional status F.  Physical activity G. Advance directives H. List of other physicians I.  Hospitalizations, surgeries, and ER visits in previous 12 months J.  [place] such as hearing and vision if needed, cognitive and depression L. Referrals and appointments   In addition, I have reviewed and discussed with patient certain preventive protocols, quality metrics, and best practice recommendations. A written personalized care plan for preventive services as well as general preventive health recommendations were provided to patient. Nurse Health Advisor  Signed,    Malyk Girouard [place], [place]  [phone] Nurse Health Advisor   Nurse Notes: Pt needs a pap smear at upcoming CPE.

## 2019-08-24 ENCOUNTER — Other Ambulatory Visit: Payer: Self-pay

## 2019-08-24 ENCOUNTER — Ambulatory Visit (INDEPENDENT_AMBULATORY_CARE_PROVIDER_SITE_OTHER): Payer: Medicare Other

## 2019-08-24 DIAGNOSIS — Z0000 Encounter for general adult medical examination without abnormal findings: Secondary | ICD-10-CM | POA: Diagnosis not present

## 2019-08-24 DIAGNOSIS — Z1211 Encounter for screening for malignant neoplasm of colon: Secondary | ICD-10-CM | POA: Diagnosis not present

## 2019-08-24 DIAGNOSIS — Z1231 Encounter for screening mammogram for malignant neoplasm of breast: Secondary | ICD-10-CM

## 2019-08-24 NOTE — Patient Instructions (Signed)
Susan Gilmore , Thank you for taking time to come for your Medicare Wellness Visit. I appreciate your ongoing commitment to your health goals. Please review the following plan we discussed and let me know if I can assist you in the future.   Screening recommendations/referrals: Colonoscopy: Referral to GI placed today. Pt aware the office will call re: appt. Mammogram: Ordered today. Pt provided with contact info and advised to call to schedule appt. Recommended yearly ophthalmology/optometry visit for glaucoma screening and checkup Recommended yearly dental visit for hygiene and checkup  Vaccinations: Influenza vaccine: Up to date Tdap vaccine: Up to date, due 09/2020    Advanced directives: Advance directive discussed with you today. Even though you declined this today please call our office should you change your mind and we can give you the proper paperwork for you to fill out.  Conditions/risks identified: Recommend to increase water intake to 6-8 8 oz glasses a day.   Next appointment: 08/30/19 @ 8:40 for a nurse visit & CPE scheduled 09/16/19 @ 9:00 AM with Fenton Malling.   Preventive Care 51-64 Years, Female Preventive care refers to lifestyle choices and visits with your health care provider that can promote health and wellness. What does preventive care include?  A yearly physical exam. This is also called an annual well check.  Dental exams once or twice a year.  Routine eye exams. Ask your health care provider how often you should have your eyes checked.  Personal lifestyle choices, including:  Daily care of your teeth and gums.  Regular physical activity.  Eating a healthy diet.  Avoiding tobacco and drug use.  Limiting alcohol use.  Practicing safe sex.  Taking low-dose aspirin daily starting at age 51.  Taking vitamin and mineral supplements as recommended by your health care provider. What happens during an annual well check? The services and screenings  done by your health care provider during your annual well check will depend on your age, overall health, lifestyle risk factors, and family history of disease. Counseling  Your health care provider may ask you questions about your:  Alcohol use.  Tobacco use.  Drug use.  Emotional well-being.  Home and relationship well-being.  Sexual activity.  Eating habits.  Work and work Statistician.  Method of birth control.  Menstrual cycle.  Pregnancy history. Screening  You may have the following tests or measurements:  Height, weight, and BMI.  Blood pressure.  Lipid and cholesterol levels. These may be checked every 5 years, or more frequently if you are over 14 years old.  Skin check.  Lung cancer screening. You may have this screening every year starting at age 51 if you have a 30-pack-year history of smoking and currently smoke or have quit within the past 15 years.  Fecal occult blood test (FOBT) of the stool. You may have this test every year starting at age 51.  Flexible sigmoidoscopy or colonoscopy. You may have a sigmoidoscopy every 5 years or a colonoscopy every 10 years starting at age 51.  Hepatitis C blood test.  Hepatitis B blood test.  Sexually transmitted disease (STD) testing.  Diabetes screening. This is done by checking your blood sugar (glucose) after you have not eaten for a while (fasting). You may have this done every 1-3 years.  Mammogram. This may be done every 1-2 years. Talk to your health care provider about when you should start having regular mammograms. This may depend on whether you have a family history of breast cancer.  BRCA-related cancer screening. This may be done if you have a family history of breast, ovarian, tubal, or peritoneal cancers.  Pelvic exam and Pap test. This may be done every 3 years starting at age 51. Starting at age 51, this may be done every 5 years if you have a Pap test in combination with an HPV test.  Bone  density scan. This is done to screen for osteoporosis. You may have this scan if you are at high risk for osteoporosis. Discuss your test results, treatment options, and if necessary, the need for more tests with your health care provider. Vaccines  Your health care provider may recommend certain vaccines, such as:  Influenza vaccine. This is recommended every year.  Tetanus, diphtheria, and acellular pertussis (Tdap, Td) vaccine. You may need a Td booster every 10 years.  Zoster vaccine. You may need this after age 51.  Pneumococcal 13-valent conjugate (PCV13) vaccine. You may need this if you have certain conditions and were not previously vaccinated.  Pneumococcal polysaccharide (PPSV23) vaccine. You may need one or two doses if you smoke cigarettes or if you have certain conditions. Talk to your health care provider about which screenings and vaccines you need and how often you need them. This information is not intended to replace advice given to you by your health care provider. Make sure you discuss any questions you have with your health care provider. Document Released: 09/08/2015 Document Revised: 05/01/2016 Document Reviewed: 06/13/2015 Elsevier Interactive Patient Education  2017 [place] Prevention in the Home Falls can cause injuries. They can happen to people of all ages. There are many things you can do to make your home safe and to help prevent falls. What can I do on the outside of my home?  Regularly fix the edges of walkways and driveways and fix any cracks.  Remove anything that might make you trip as you walk through a door, such as a raised step or threshold.  Trim any bushes or trees on the path to your home.  Use bright outdoor lighting.  Clear any walking paths of anything that might make someone trip, such as rocks or tools.  Regularly check to see if handrails are loose or broken. Make sure that both sides of any steps have  handrails.  Any raised decks and porches should have guardrails on the edges.  Have any leaves, snow, or ice cleared regularly.  Use sand or salt on walking paths during winter.  Clean up any spills in your garage right away. This includes oil or grease spills. What can I do in the bathroom?  Use night lights.  Install grab bars by the toilet and in the tub and shower. Do not use towel bars as grab bars.  Use non-skid mats or decals in the tub or shower.  If you need to sit down in the shower, use a plastic, non-slip stool.  Keep the floor dry. Clean up any water that spills on the floor as soon as it happens.  Remove soap buildup in the tub or shower regularly.  Attach bath mats securely with double-sided non-slip rug tape.  Do not have throw rugs and other things on the floor that can make you trip. What can I do in the bedroom?  Use night lights.  Make sure that you have a light by your bed that is easy to reach.  Do not use any sheets or blankets that are too big for your bed. They  should not hang down onto the floor.  Have a firm chair that has side arms. You can use this for support while you get dressed.  Do not have throw rugs and other things on the floor that can make you trip. What can I do in the kitchen?  Clean up any spills right away.  Avoid walking on wet floors.  Keep items that you use a lot in easy-to-reach places.  If you need to reach something above you, use a strong step stool that has a grab bar.  Keep electrical cords out of the way.  Do not use floor polish or wax that makes floors slippery. If you must use wax, use non-skid floor wax.  Do not have throw rugs and other things on the floor that can make you trip. What can I do with my stairs?  Do not leave any items on the stairs.  Make sure that there are handrails on both sides of the stairs and use them. Fix handrails that are broken or loose. Make sure that handrails are as long as  the stairways.  Check any carpeting to make sure that it is firmly attached to the stairs. Fix any carpet that is loose or worn.  Avoid having throw rugs at the top or bottom of the stairs. If you do have throw rugs, attach them to the floor with carpet tape.  Make sure that you have a light switch at the top of the stairs and the bottom of the stairs. If you do not have them, ask someone to add them for you. What else can I do to help prevent falls?  Wear shoes that:  Do not have high heels.  Have rubber bottoms.  Are comfortable and fit you well.  Are closed at the toe. Do not wear sandals.  If you use a stepladder:  Make sure that it is fully opened. Do not climb a closed stepladder.  Make sure that both sides of the stepladder are locked into place.  Ask someone to hold it for you, if possible.  Clearly mark and make sure that you can see:  Any grab bars or handrails.  First and last steps.  Where the edge of each step is.  Use tools that help you move around (mobility aids) if they are needed. These include:  Canes.  Walkers.  Scooters.  Crutches.  Turn on the lights when you go into a dark area. Replace any light bulbs as soon as they burn out.  Set up your furniture so you have a clear path. Avoid moving your furniture around.  If any of your floors are uneven, fix them.  If there are any pets around you, be aware of where they are.  Review your medicines with your doctor. Some medicines can make you feel dizzy. This can increase your chance of falling. Ask your doctor what other things that you can do to help prevent falls. This information is not intended to replace advice given to you by your health care provider. Make sure you discuss any questions you have with your health care provider. Document Released: 06/08/2009 Document Revised: 01/18/2016 Document Reviewed: 09/16/2014 Elsevier Interactive Patient Education  2017 [employer].

## 2019-08-30 ENCOUNTER — Other Ambulatory Visit: Payer: Self-pay

## 2019-08-30 ENCOUNTER — Ambulatory Visit (INDEPENDENT_AMBULATORY_CARE_PROVIDER_SITE_OTHER): Payer: Medicare Other | Admitting: Physician Assistant

## 2019-08-30 DIAGNOSIS — Z3042 Encounter for surveillance of injectable contraceptive: Secondary | ICD-10-CM

## 2019-09-03 ENCOUNTER — Encounter: Payer: Self-pay | Admitting: *Deleted

## 2019-09-07 MED ORDER — MEDROXYPROGESTERONE ACETATE 150 MG/ML IM SUSP
150.0000 mg | Freq: Once | INTRAMUSCULAR | Status: AC
  Administered 2019-08-30: 150 mg via INTRAMUSCULAR

## 2019-09-08 ENCOUNTER — Other Ambulatory Visit: Payer: Self-pay | Admitting: Physician Assistant

## 2019-09-08 DIAGNOSIS — I10 Essential (primary) hypertension: Secondary | ICD-10-CM

## 2019-09-15 ENCOUNTER — Other Ambulatory Visit: Payer: Self-pay | Admitting: Cardiology

## 2019-09-15 DIAGNOSIS — I2510 Atherosclerotic heart disease of native coronary artery without angina pectoris: Secondary | ICD-10-CM

## 2019-09-16 ENCOUNTER — Other Ambulatory Visit: Payer: Self-pay | Admitting: Physician Assistant

## 2019-09-16 ENCOUNTER — Encounter: Payer: Medicare Other | Admitting: Physician Assistant

## 2019-09-16 DIAGNOSIS — M069 Rheumatoid arthritis, unspecified: Secondary | ICD-10-CM

## 2019-09-16 DIAGNOSIS — G8929 Other chronic pain: Secondary | ICD-10-CM

## 2019-09-16 MED ORDER — OXYCODONE HCL 10 MG PO TABS: 10.0000 mg | ORAL_TABLET | Freq: Four times a day (QID) | ORAL | 0 refills | Status: DC | PRN

## 2019-09-16 NOTE — Telephone Encounter (Signed)
Medication Refill - Medication:  Oxycodone HCl 10 MG TABS    Has the patient contacted their pharmacy?  Yes advised to call office.   Preferred Pharmacy (with phone number or street name):  [id number] [place], [place] - [address] Phone:  [phone]  Fax:  [phone]       Agent: Please be advised that RX refills may take up to 3 business days. We ask that you follow-up with your pharmacy.

## 2019-09-23 ENCOUNTER — Other Ambulatory Visit: Payer: Self-pay | Admitting: Physician Assistant

## 2019-09-23 DIAGNOSIS — I10 Essential (primary) hypertension: Secondary | ICD-10-CM

## 2019-09-27 ENCOUNTER — Telehealth: Payer: Self-pay

## 2019-09-27 DIAGNOSIS — J209 Acute bronchitis, unspecified: Secondary | ICD-10-CM

## 2019-09-27 MED ORDER — AMOXICILLIN-POT CLAVULANATE 875-125 MG PO TABS: 1.0000 | ORAL_TABLET | Freq: Two times a day (BID) | ORAL | 0 refills | Status: DC

## 2019-09-27 MED ORDER — PREDNISONE 10 MG PO TABS: ORAL_TABLET | ORAL | 0 refills | Status: DC

## 2019-09-27 NOTE — Telephone Encounter (Signed)
Copied from [place] [id number]. Topic: General - Other >> Sep 27, 2019  2:31 PM Greggory Keen D wrote: Reason for CRM: Pt called asking if [name] will send in prednisone and an antibiotic.  She says she has asthma and it is acting up.  She said [name] does this for her.  She also said she know s that it is not Covid... CB#  [id number] [facility]

## 2019-09-27 NOTE — Telephone Encounter (Signed)
Sent in Augmentin and prednisone to [facility]

## 2019-09-27 NOTE — Telephone Encounter (Signed)
Patient advised as directed below. 

## 2019-09-27 NOTE — Addendum Note (Signed)
Addended by: [name] on: 09/27/2019 05:03 PM   Modules accepted: Orders

## 2019-10-05 ENCOUNTER — Telehealth: Payer: Self-pay | Admitting: *Deleted

## 2019-10-05 NOTE — Telephone Encounter (Signed)
A message was left, re: her follow up visit. 

## 2019-10-07 ENCOUNTER — Telehealth: Payer: Medicare Other | Admitting: Cardiology

## 2019-10-08 ENCOUNTER — Other Ambulatory Visit: Payer: Self-pay | Admitting: Physician Assistant

## 2019-10-08 DIAGNOSIS — I10 Essential (primary) hypertension: Secondary | ICD-10-CM

## 2019-10-08 NOTE — Telephone Encounter (Signed)
Yes please make an OV

## 2019-10-08 NOTE — Telephone Encounter (Signed)
On 3/22 she is scheduled for Depo only. Do you want to make it an office visit?

## 2019-10-08 NOTE — Telephone Encounter (Signed)
Requested medication (s) are due for refill today:  Yes  Requested medication (s) are on the active medication list:   Yes  Future visit scheduled:   No   Last ordered: 09/08/2019  #30  0 refills.    30 day courtesy refill given in Jan.    Fail protocol for overdue OV.   Requested Prescriptions  Pending Prescriptions Disp Refills   hydrochlorothiazide (HYDRODIURIL) 25 MG tablet [Pharmacy Med Name: HYDROCHLOROTHIAZIDE 25 MG TAB] 30 tablet 0    Sig: TAKE 1 TABLET BY MOUTH EVERY DAY      Cardiovascular: Diuretics - Thiazide Failed - 10/08/2019  1:32 PM      Failed - Ca in normal range and within 360 days    Calcium  Date Value Ref Range Status  03/16/2019 8.7 (L) 8.9 - 10.3 mg/dL Final   Calcium, Total  Date Value Ref Range Status  06/22/2014 8.1 (L) 8.5 - 10.1 mg/dL Final          Failed - K in normal range and within 360 days    Potassium  Date Value Ref Range Status  03/16/2019 3.0 (L) 3.5 - 5.1 mmol/L Final  06/22/2014 3.6 3.5 - 5.1 mmol/L Final          Failed - Valid encounter within last 6 months    Recent Outpatient Visits           10 months ago COPD with acute exacerbation [facility])   [facility] [place], [place], [place]   1 year ago Acute cystitis without hematuria   [place], Clearnce Sorrel, [place]   1 year ago Encounter for surveillance of injectable contraceptive   [place], [place], PA-C   1 year ago Elevated brain natriuretic peptide (BNP) level   [facility], [place], [place]   1 year ago Essential hypertension   [place], Clearnce Sorrel, [place]       Future Appointments             In 6 days [name], MD [facility], [facility]            Passed - Cr in normal range and within 360 days    Creat  Date Value Ref Range Status  06/12/2017 0.94 0.50 - 1.10 mg/dL Final   Creatinine, Ser  Date Value Ref Range  Status  03/16/2019 0.83 0.44 - 1.00 mg/dL Final          Passed - Na in normal range and within 360 days    Sodium  Date Value Ref Range Status  03/16/2019 136 135 - 145 mmol/L Final  02/18/2018 142 134 - 144 mmol/L Final  06/22/2014 143 136 - 145 mmol/L Final          Passed - Last BP in normal range    BP Readings from Last 1 Encounters:  04/26/19 128/74

## 2019-10-14 ENCOUNTER — Ambulatory Visit: Payer: Medicare Other | Admitting: Cardiology

## 2019-10-15 ENCOUNTER — Other Ambulatory Visit: Payer: Self-pay | Admitting: Physician Assistant

## 2019-10-15 DIAGNOSIS — M069 Rheumatoid arthritis, unspecified: Secondary | ICD-10-CM

## 2019-10-15 DIAGNOSIS — G8929 Other chronic pain: Secondary | ICD-10-CM

## 2019-10-15 MED ORDER — OXYCODONE HCL 10 MG PO TABS: 10.0000 mg | ORAL_TABLET | Freq: Four times a day (QID) | ORAL | 0 refills | Status: DC | PRN

## 2019-10-15 NOTE — Telephone Encounter (Signed)
Requested medications are due for refill today?  Yes  Requested medications are on active medication list? Yes  Last Refill:   09/16/2019 #120 tabs with 0 refills  Future visit scheduled? Yes in one month with Fenton Malling, PA-C  Notes to Clinic:   Non-delegated med.

## 2019-10-15 NOTE — Telephone Encounter (Signed)
Medication Refill - Medication: oxycodone  Has the patient contacted their pharmacy? Yes.   (Agent: If no, request that the patient contact the pharmacy for the refill.) (Agent: If yes, when and what did the pharmacy advise?)  Preferred Pharmacy (with phone number or street name):  [id number] [place], [place] [address]  [address] [place] [address]  Phone: [phone] Fax: [phone]  Not a 24 hour pharmacy; exact hours not known.     Agent: Please be advised that RX refills may take up to 3 business days. We ask that you follow-up with your pharmacy.

## 2019-10-18 ENCOUNTER — Encounter: Payer: Self-pay | Admitting: Cardiology

## 2019-10-18 ENCOUNTER — Ambulatory Visit: Payer: Medicare Other | Admitting: Cardiology

## 2019-10-18 ENCOUNTER — Other Ambulatory Visit: Payer: Self-pay

## 2019-10-18 VITALS — BP 137/76 | HR 106 | Temp 97.3°F | Ht 61.0 in | Wt 204.0 lb

## 2019-10-18 DIAGNOSIS — E782 Mixed hyperlipidemia: Secondary | ICD-10-CM | POA: Diagnosis not present

## 2019-10-18 DIAGNOSIS — I10 Essential (primary) hypertension: Secondary | ICD-10-CM | POA: Diagnosis not present

## 2019-10-18 DIAGNOSIS — R079 Chest pain, unspecified: Secondary | ICD-10-CM | POA: Diagnosis not present

## 2019-10-18 DIAGNOSIS — I2510 Atherosclerotic heart disease of native coronary artery without angina pectoris: Secondary | ICD-10-CM | POA: Diagnosis not present

## 2019-10-18 DIAGNOSIS — Z7189 Other specified counseling: Secondary | ICD-10-CM | POA: Diagnosis not present

## 2019-10-18 DIAGNOSIS — I2584 Coronary atherosclerosis due to calcified coronary lesion: Secondary | ICD-10-CM

## 2019-10-18 DIAGNOSIS — R9431 Abnormal electrocardiogram [ECG] [EKG]: Secondary | ICD-10-CM

## 2019-10-18 NOTE — Patient Instructions (Signed)

## 2019-10-18 NOTE — Progress Notes (Signed)
Cardiology Office Note:    Date:  10/18/2019   ID:  Susan Gilmore, DOB May 31, 1966, MRN [id number]  PCP:  [name], PA-C  Cardiologist:  [name], MD PhD  Referring MD: [name]*   CC: follow up  History of Present Illness:    Susan Gilmore is a 54 y.o. female with a hx of rheumatoid arthritis, COPD who is seen in follow up. She was initially seen as a new consult on 04/24/18.   Cardiac history: initially seen in consult for elevated BNP (no echo). However, noted chest discomfort at that time. Nonexertional but dull, hadn't occurred recently. She was very short of breath. Had been a heavy smoker (2-3 ppd) but changed to vaping. Had risk factors for sleep apnea, was ordered for sleep study and now pending CPAP.  Remotely, was seen by Dr. [name] in 2012, reports having a normal cath (I can see the Ao pressures but not the anatomy on the uploaded document). She also saw Dr. [name] in 2013.  She reports a history of bigeminy (found in 2012-2013 incidentally). Rare issue for her, notices it when she lays down to sleep. Computer notes atrial fib/flutter in the past (she is not aware of this diagnosis, never on blood thinners). I personally reviewed all of her ECGs in the system and cannot find evidence of this. Had coronary CTA, was 99th percentile in calcium score. Seen in ER at [facility] 03/16/19 with aching pain that was worse with deep breathing or movement. HSTn ruled out ACS. ECG was sinus tach with ST depressions/TWI in multiple leads. This was similar to prior ECG from 04/24/18. She wasn't told what might be the cause, she thinks it was her arthritis.   Today: Rare intermittent central chest pressure, similar to prior. Happens rarely, not recently. Sometimes moves to the right side. Worse with stress. No other clear triggers. No syncope. Does feel weak when it happens, has to sit down and rest. Lasts seconds to minutes, rarely longer. Lots of stress with  taking care of her husband at home. Very little time to herself, we talked about self care today. She is tearful, talked about stress. She cares for her husband and grandson, states that if it was up to her, she would just sleep all the time. Reviewed her prior cardiac testing today.   Denies shortness of breath at rest or with normal exertion. No PND, orthopnea, LE edema or unexpected weight gain. No syncope.   Past Medical History:  Diagnosis Date  . Abnormal Pap smear 04/28/2012   normal pap /positive hpv   . Abnormal stress test   . Allergic rhinitis   . Bipolar disorder ([place])   . Carpal tunnel syndrome   . Chest pain   . Chronic anxiety   . Contraception   . Cough   . HPV (human papilloma virus) anogenital infection   . HTN (hypertension)   . Hyperplastic colon polyp   . Insomnia   . Palpitations   . Pneumonia     Past Surgical History:  Procedure Laterality Date  . full mouth dental    . GANGLION CYST EXCISION    . WISDOM TOOTH EXTRACTION      Current Medications: Current Outpatient Medications on File Prior to Visit  Medication Sig  . albuterol (PROAIR HFA) 108 (90 Base) MCG/ACT inhaler Inhale 1-2 puffs into the lungs every 6 (six) hours as needed for wheezing or shortness of breath.  . ALPRAZolam (XANAX) 1 MG  tablet Take 1 mg by mouth 4 (four) times daily as needed for anxiety.   [name] amLODipine (NORVASC) 5 MG tablet Take 1 tablet (5 mg total) by mouth daily.  [name] amoxicillin-clavulanate (AUGMENTIN) 875-125 MG tablet Take 1 tablet by mouth 2 (two) times daily.  [name] amphetamine-dextroamphetamine (ADDERALL XR) 30 MG 24 hr capsule Take 60 mg by mouth daily.  [name] aspirin EC 81 MG tablet Take 1 tablet (81 mg total) by mouth daily.  [name] atorvastatin (LIPITOR) 40 MG tablet TAKE 1 TABLET BY MOUTH EVERY DAY  . benzonatate (TESSALON) 200 MG capsule Take 1 capsule (200 mg total) by mouth 3 (three) times daily as needed.  . DULoxetine (CYMBALTA) 60 MG capsule Take 120 mg by mouth daily.     . fluticasone (FLONASE) 50 MCG/ACT nasal spray Place 2 sprays into both nostrils daily. (Patient taking differently: Place 2 sprays into both nostrils 2 (two) times daily. )  . Fluticasone-Salmeterol (ADVAIR) 250-50 MCG/DOSE AEPB Inhale 1 puff into the lungs 2 (two) times daily.  . hydrochlorothiazide (HYDRODIURIL) 25 MG tablet TAKE 1 TABLET BY MOUTH EVERY DAY  . ipratropium-albuterol (DUONEB) 0.5-2.5 (3) MG/3ML SOLN TAKE 3 MLS BY NEBULIZATION EVERY 6 (SIX) HOURS AS NEEDED (SHORTNESS OF BREATH).  [name] loratadine (CLARITIN) 10 MG tablet Take 1 tablet by mouth daily as needed for allergies.   [name] losartan (COZAAR) 100 MG tablet TAKE 1 TABLET BY MOUTH EVERY DAY  . medroxyPROGESTERone Acetate 150 MG/ML SUSY Inject 1 mL (150 mg total) into the muscle every 3 (three) months.  . montelukast (SINGULAIR) 10 MG tablet Take 1 tablet by mouth daily as needed.   [name] omeprazole (PRILOSEC) 20 MG capsule Take 1 capsule (20 mg total) by mouth daily. (Patient taking differently: Take 20 mg by mouth daily. As needed)  . Oxycodone HCl 10 MG TABS Take 1 tablet (10 mg total) by mouth every 6 (six) hours as needed. For severe pain  . potassium citrate (UROCIT-K) 10 MEQ (1080 MG) SR tablet Take 1 tablet (10 mEq total) by mouth daily. (Patient not taking: Reported on 08/24/2019)  . predniSONE (DELTASONE) 10 MG tablet Take 6 tablets PO on day 1 and day 2, take 5 tablets PO on day 3 and day 4, take 4 tablets PO on day 5 and day 6, take 3 tablets PO on day 7 and day 8, take 2 tablets PO on day 9 and day 10, take one tablet PO on day 11 and day 12.  . zolpidem (AMBIEN) 10 MG tablet Take 10 mg by mouth at bedtime.    No current facility-administered medications on file prior to visit.     Allergies:   Effexor [venlafaxine hcl] and Isosorbide nitrate   Social History   Tobacco Use  . Smoking status: Former Smoker    Packs/day: 1.00    Years: 26.00    Pack years: 26.00    Types: Cigarettes    Quit date: 07/26/2013    Years  since quitting: 6.2  . Smokeless tobacco: Never Used  Substance Use Topics  . Alcohol use: No    Alcohol/week: 0.0 standard drinks    Comment: Had a history of alcohol use but currently is not drinking any  . Drug use: No    Family History: The patient's family history includes Diabetes in her maternal grandmother and mother; Heart attack in her mother; Heart disease in her father, maternal grandfather, maternal grandmother, and paternal grandmother; Hyperlipidemia in her father; Hypertension in her father, maternal grandfather, and  maternal grandmother; Stroke in her father and paternal grandmother.  ROS:   Please see the history of present illness.  Additional pertinent ROS negative except as documented.  EKGs/Labs/Other Studies Reviewed:    The following studies were reviewed today: Echo 05/04/18 - Left ventricle: The cavity size was normal. 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 Systolic function was normal. The   estimated ejection fraction was in the range of 60% to 65%. Wall   motion was normal; there were no regional wall motion   abnormalities. Doppler parameters are consistent with abnormal   left ventricular relaxation (grade 1 diastolic dysfunction). - Aortic valve: Poorly visualized. Probably trileaflet; mildly   calcified leaflets. There was no stenosis. - Mitral valve: There was no significant regurgitation. - Right ventricle: The cavity size was normal. Systolic function   was normal. - Pulmonary arteries: No complete TR doppler jet so unable to   estimate PA systolic pressure. - Inferior vena cava: The vessel was normal in size. The   respirophasic diameter changes were in the normal range (>= 50%),   consistent with normal central venous pressure.  Impressions: - Normal LV size with prominent LV hypertrophy towards the apex.   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 EF 60-65%. Normal RV size and   systolic function. No significant valvular abnormalities.  CTA coronary 06/04/18 IMPRESSION: 1. Coronary calcium score of 210. This was 28 percentile for age and sex matched control.  2. Normal coronary origin with right dominance.  3. Moderate stenosis in the mid RCA and LAD. Additional analysis with CT FFR will be submitted.  1. Left Main:  No significant stenosis. 2. LAD: No significant stenosis. 3. LCX: No significant stenosis. 4. RCA: No significant stenosis.  IMPRESSION: 1. CT FFR analysis didn't show any significant stenosis. Aggressive medical management for non-obstructive CAD is recommended.   EKG:  EKG is personally reviewed today.  The ekg ordered 03/16/19 demonstrates sinus tachycardia at 102 bpm with significant (though unchanged) ST changes, most notably ST depressions in multiple leads.  Recent Labs: 03/16/2019: ALT 23; BUN 16; Creatinine, Ser 0.83; Hemoglobin 11.3; Platelets 226; Potassium 3.0; Sodium 136  Recent Lipid Panel    Component Value Date/Time   CHOL 84 (L) 10/23/2018 1127   CHOL 166 06/22/2014 1033   TRIG 74 10/23/2018 1127   TRIG 175 06/22/2014 1033   HDL 31 (L) 10/23/2018 1127   HDL 39 (L) 06/22/2014 1033   CHOLHDL 2.7 10/23/2018 1127   VLDL 35 06/22/2014 1033   LDLCALC 38 10/23/2018 1127   LDLCALC 92 06/22/2014 1033    Physical Exam:    VS:  BP 137/76   Pulse (!) 106   Temp (!) 97.3 F (36.3 C)   Ht 5\' 1"  (1.549 m)   Wt 204 lb (92.5 kg)   SpO2 100%   BMI 38.55 kg/m     Wt Readings from Last 3 Encounters:  10/18/19 204 lb (92.5 kg)  04/26/19 199 lb 6.4 oz (90.4 kg)  03/16/19 180 lb (81.6 kg)    GEN: Well nourished, well developed in no acute distress HEENT: Normal, moist mucous membranes NECK: No JVD CARDIAC: regular rhythm, normal S1 and S2, no rubs or gallops. No murmur. VASCULAR: Radial and DP pulses 2+ bilaterally. No carotid  bruits RESPIRATORY:  Clear to auscultation without rales, wheezing or rhonchi  ABDOMEN: Soft, non-tender, non-distended MUSCULOSKELETAL:  Ambulates  independently SKIN: Warm and dry, no edema NEUROLOGIC:  Alert and oriented x 3. No focal neuro deficits noted. PSYCHIATRIC:  Normal affect    ASSESSMENT:    1. Chest pain, unspecified type   2. Coronary artery disease due to calcified coronary lesion   3. Essential hypertension   4. Mixed hyperlipidemia   5. Counseling on health promotion and disease prevention   6. Abnormal EKG    PLAN:    Chest pain, abnormal EKG--> coronary CTA with diffuse calcification, 99th percentile, with two localized lesions (FFR WNL). Consistent with coronary artery disease. Continues to have rare atypical chest pain, unlikely due to her nonobstructive CAD. -aggressive prevention.  -tolerating atorvastatin 40 mg. -again reviewed stress level. She is tearful today but feels she is doing the best she can. Has follow up with her PCP, recommended discussing if her medications can be adjusted or if there are additional options to help her stress level and resulting anxiety/depression -reviewed red flag warning signs that need immediate medical attention -continue aspirin 81 mg. -no obstructive disease on CT, not ACS during prior ER visit. Reviewed her prior workup together again today  Hypertension -within range of goal, has recently been better but endorses lots of stress today -continue amlodipine 5 mg, losartan 100 mg daily -next med would be spironolactone given diastolic dysfunction. With her underlying lung disease, would avoid beta blocker, though this may also be beneficial for CAD  Mixed hyperlipidemia: -continue atorvastatin 40 mg daily -LDL went from 85 to 38 on atorvastatin 10/23/18, LFTs normal -TG also improved from 148-175 range to 74 09/2018  Secondary Prevention: counseling, review of risk factors -recommend heart healthy/Mediterranean diet, with  whole grains, fruits, vegetable, fish, lean meats, nuts, and olive oil. Limit salt. -recommend moderate walking, 3-5 times/week for 30-50 minutes each session. Aim for at least 150 minutes.week. Goal should be pace of 3 miles/hours, or walking 1.5 miles in 30 minutes -recommend avoidance of tobacco products. Avoid excess alcohol.  Plan for follow up: 6 mos or sooner PRN.  Medication Adjustments/Labs and Tests Ordered: Current medicines are reviewed at length with the patient today.  Concerns regarding medicines are outlined above.  No orders of the defined types were placed in this encounter.  No orders of the defined types were placed in this encounter.   Patient Instructions  Medication Instructions:  Your Physician recommend you continue on your current medication as directed.    *If you need a refill on your cardiac medications before your next appointment, please call your pharmacy*  Lab Work: None  Testing/Procedures: None  Follow-Up: At [facility], you and your health needs are our priority.  As part of our continuing mission to provide you with exceptional heart care, we have created designated Provider Care Teams.  These Care Teams include your primary Cardiologist (physician) and Advanced Practice Providers (APPs -  Physician Assistants and Nurse Practitioners) who all work together to provide you with the care you need, when you need it.  Your next appointment:   6 month(s)  The format for your next appointment:   In Person  Provider:   [name], MD    Signed, [name], MD PhD 10/18/2019  [place]

## 2019-11-11 ENCOUNTER — Other Ambulatory Visit: Payer: Self-pay | Admitting: Physician Assistant

## 2019-11-11 DIAGNOSIS — M069 Rheumatoid arthritis, unspecified: Secondary | ICD-10-CM

## 2019-11-11 DIAGNOSIS — G8929 Other chronic pain: Secondary | ICD-10-CM

## 2019-11-11 MED ORDER — OXYCODONE HCL 10 MG PO TABS: 10.0000 mg | ORAL_TABLET | Freq: Four times a day (QID) | ORAL | 0 refills | Status: DC | PRN

## 2019-11-11 NOTE — Telephone Encounter (Signed)
Requested medication (s) are due for refill today - yes  Requested medication (s) are on the active medication list -yes  Future visit scheduled -no  Last refill: 10/15/19  Notes to clinic: Patient request non delegated Rx  Requested Prescriptions  Pending Prescriptions Disp Refills   Oxycodone HCl 10 MG TABS 120 tablet 0    Sig: Take 1 tablet (10 mg total) by mouth every 6 (six) hours as needed. For severe pain      Not Delegated - Analgesics:  Opioid Agonists Failed - 11/11/2019 12:24 PM      Failed - This refill cannot be delegated      Failed - Urine Drug Screen completed in last 360 days.      Failed - Valid encounter within last 6 months    Recent Outpatient Visits           11 months ago COPD with acute exacerbation [facility])   [facility], [place], [place]   1 year ago Acute cystitis without hematuria   [place], Clearnce Sorrel, [place]   1 year ago Encounter for surveillance of injectable contraceptive   [facility] [place], [place], PA-C   1 year ago Elevated brain natriuretic peptide (BNP) level   [facility], Clearnce Sorrel, [place]   1 year ago Essential hypertension   [place], Clearnce Sorrel, PA-C       Future Appointments             In 4 days [place], Clearnce Sorrel, PA-C [employer], [facility]                Requested Prescriptions  Pending Prescriptions Disp Refills   Oxycodone HCl 10 MG TABS 120 tablet 0    Sig: Take 1 tablet (10 mg total) by mouth every 6 (six) hours as needed. For severe pain      Not Delegated - Analgesics:  Opioid Agonists Failed - 11/11/2019 12:24 PM      Failed - This refill cannot be delegated      Failed - Urine Drug Screen completed in last 360 days.      Failed - Valid encounter within last 6 months    Recent Outpatient Visits           11 months ago COPD with acute exacerbation [facility])   [place], [place]   1 year ago Acute cystitis without hematuria   [place], Clearnce Sorrel, [place]   1 year ago Encounter for surveillance of injectable contraceptive   [place], PA-C   1 year ago Elevated brain natriuretic peptide (BNP) level   [facility], Clearnce Sorrel, [place]   1 year ago Essential hypertension   [place], Clearnce Sorrel, PA-C       Future Appointments             In 4 days Marlyn Corporal, Clearnce Sorrel, PA-C [employer], [place]

## 2019-11-11 NOTE — Telephone Encounter (Signed)
Medication Refill - Medication: oxycodone  Has the patient contacted their pharmacy? No. (Agent: If no, request that the patient contact the pharmacy for the refill.) (Agent: If yes, when and what did the pharmacy advise?)  Preferred Pharmacy (with phone number or street name):  [id number] [place], [place] [address]  [address] [place] [address]  Phone: [phone] Fax: [phone]  Not a 24 hour pharmacy; exact hours not known.     Agent: Please be advised that RX refills may take up to 3 business days. We ask that you follow-up with your pharmacy.

## 2019-11-12 NOTE — Progress Notes (Signed)
Patient: Susan Gilmore Female    DOB: [birth year]   54 y.o.   MRN: [id number] Visit Date: 11/15/2019  Today's Provider: [name], PA-C   Chief Complaint  Patient presents with  . Follow-up    HTN   Subjective:     HPI  Essential hypertension: patient here for follow-up. This is a chronic problem.   BP Readings from Last 3 Encounters:  11/15/19 120/75  10/18/19 137/76  04/26/19 128/74   Management since that visit includes continue propanolol ER 160mg  daily and Losartan-HCTZ 100-25mg  daily.   She reports excellent compliance with treatment. She is not having side effects.  She is not exercising. She is not adherent to low salt diet.   Outside blood pressures are 140's/70's-80's. She is experiencing none.  Patient denies    She is also here for her Depo provera injection and is to return between Jun 7 - Jun 21. Reports that she doesn't have the prescription today.  Rheumatoid arthritis:Patient with c/o of hand swelling unable to make a fist.She is not able to open her medicines or anything with her hands. She reports that her knees are hurting and swollen.   Allergies  Allergen Reactions  . Effexor [Venlafaxine Hcl] Swelling  . Isosorbide Nitrate Other (See Comments)    Reaction: Bad headache     Current Outpatient Medications:  .  ALPRAZolam (XANAX) 1 MG tablet, Take 1 mg by mouth 4 (four) times daily as needed for anxiety. , Disp: , Rfl:  .  amLODipine (NORVASC) 5 MG tablet, Take 1 tablet (5 mg total) by mouth daily., Disp: 90 tablet, Rfl: 3 .  amphetamine-dextroamphetamine (ADDERALL XR) 30 MG 24 hr capsule, Take 60 mg by mouth daily., Disp: , Rfl: 0 .  aspirin EC 81 MG tablet, Take 1 tablet (81 mg total) by mouth daily., Disp: 90 tablet, Rfl: 3 .  atorvastatin (LIPITOR) 40 MG tablet, TAKE 1 TABLET BY MOUTH EVERY DAY, Disp: 90 tablet, Rfl: 3 .  DULoxetine (CYMBALTA) 60 MG capsule, Take 120 mg by mouth daily. , Disp: , Rfl:  .  fluticasone  (FLONASE) 50 MCG/ACT nasal spray, Place 2 sprays into both nostrils daily. (Patient taking differently: Place 2 sprays into both nostrils 2 (two) times daily. ), Disp: 16 g, Rfl: 11 .  hydrochlorothiazide (HYDRODIURIL) 25 MG tablet, TAKE 1 TABLET BY MOUTH EVERY DAY, Disp: 90 tablet, Rfl: 0 .  ipratropium-albuterol (DUONEB) 0.5-2.5 (3) MG/3ML SOLN, TAKE 3 MLS BY NEBULIZATION EVERY 6 (SIX) HOURS AS NEEDED (SHORTNESS OF BREATH)., Disp: 1080 mL, Rfl: 1 .  loratadine (CLARITIN) 10 MG tablet, Take 1 tablet by mouth daily as needed for allergies. , Disp: , Rfl:  .  losartan (COZAAR) 100 MG tablet, TAKE 1 TABLET BY MOUTH EVERY DAY, Disp: 90 tablet, Rfl: 1 .  montelukast (SINGULAIR) 10 MG tablet, Take 1 tablet by mouth daily as needed. , Disp: , Rfl:  .  omeprazole (PRILOSEC) 20 MG capsule, Take 1 capsule (20 mg total) by mouth daily. (Patient taking differently: Take 20 mg by mouth daily. As needed), Disp: 30 capsule, Rfl: 11 .  Oxycodone HCl 10 MG TABS, Take 1 tablet (10 mg total) by mouth every 6 (six) hours as needed. For severe pain, Disp: 120 tablet, Rfl: 0 .  potassium citrate (UROCIT-K) 10 MEQ (1080 MG) SR tablet, Take 1 tablet (10 mEq total) by mouth daily., Disp: 90 tablet, Rfl: 1 .  zolpidem (AMBIEN) 10 MG tablet, Take 10  mg by mouth at bedtime. , Disp: , Rfl: 0 .  albuterol (PROAIR HFA) 108 (90 Base) MCG/ACT inhaler, Inhale 1-2 puffs into the lungs every 6 (six) hours as needed for wheezing or shortness of breath. (Patient not taking: Reported on 11/15/2019), Disp: 18 g, Rfl: 11 .  amoxicillin-clavulanate (AUGMENTIN) 875-125 MG tablet, Take 1 tablet by mouth 2 (two) times daily., Disp: 20 tablet, Rfl: 0 .  benzonatate (TESSALON) 200 MG capsule, Take 1 capsule (200 mg total) by mouth 3 (three) times daily as needed., Disp: 30 capsule, Rfl: 0 .  Fluticasone-Salmeterol (ADVAIR) 250-50 MCG/DOSE AEPB, Inhale 1 puff into the lungs 2 (two) times daily., Disp: , Rfl:  .  medroxyPROGESTERone Acetate 150 MG/ML  SUSY, Inject 1 mL (150 mg total) into the muscle every 3 (three) months., Disp: 1 mL, Rfl: 3 .  predniSONE (DELTASONE) 10 MG tablet, Take 6 tablets PO on day 1 and day 2, take 5 tablets PO on day 3 and day 4, take 4 tablets PO on day 5 and day 6, take 3 tablets PO on day 7 and day 8, take 2 tablets PO on day 9 and day 10, take one tablet PO on day 11 and day 12., Disp: 42 tablet, Rfl: 0  Review of Systems  Constitutional: Negative.   Respiratory: Negative.   Cardiovascular: Negative.   Gastrointestinal: Negative.   Musculoskeletal: Positive for arthralgias, joint swelling and myalgias.  Neurological: Negative.     Social History   Tobacco Use  . Smoking status: Former Smoker    Packs/day: 1.00    Years: 26.00    Pack years: 26.00    Types: Cigarettes    Quit date: 07/26/2013    Years since quitting: 6.3  . Smokeless tobacco: Never Used  Substance Use Topics  . Alcohol use: No    Alcohol/week: 0.0 standard drinks    Comment: Had a history of alcohol use but currently is not drinking any      Objective:   BP 120/75 (BP Location: Left Arm, Patient Position: Sitting, Cuff Size: Large)   Pulse 97   Temp (!) 97.3 F (36.3 C) (Temporal)   Resp 16   Wt 208 lb 3.2 oz (94.4 kg)   BMI 39.34 kg/m  Vitals:   11/15/19 0700  BP: 120/75  Pulse: 97  Resp: 16  Temp: (!) 97.3 F (36.3 C)  TempSrc: Temporal  Weight: 208 lb 3.2 oz (94.4 kg)  Body mass index is 39.34 kg/m.   Physical Exam Vitals reviewed.  Constitutional:      General: She is not in acute distress.    Appearance: Normal appearance. She is well-developed. She is obese. She is not ill-appearing or diaphoretic.  Cardiovascular:     Rate and Rhythm: Normal rate and regular rhythm.     Heart sounds: Normal heart sounds. No murmur. No friction rub. No gallop.   Pulmonary:     Effort: Pulmonary effort is normal. No respiratory distress.     Breath sounds: Normal breath sounds. No wheezing or rales.  Musculoskeletal:       Cervical back: Normal range of motion and neck supple.     Comments: Hands bilaterally are swollen and interphalange joints are red  Neurological:     Mental Status: She is alert.      No results found for any visits on 11/15/19.     Assessment & Plan    1. Encounter for surveillance of injectable contraceptive Patient forgot medication. She will go  pick up and come back for depo injection.  - medroxyPROGESTERone Acetate 150 MG/ML SUSY; Inject 1 mL (150 mg total) into the muscle every 3 (three) months.  Dispense: 1 mL; Refill: 3 - CBC w/Diff/Platelet - Comprehensive Metabolic Panel (CMET)  2. Rheumatoid arthritis involving multiple sites, unspecified whether rheumatoid factor present ([place]) Worsening. Has tried Methotrexate, Humira, Enbrel, and one other she could not remember the name to. Of recent over the last 2-3 years she had been enrolled in a study. She reports that medication had helped the best. She is no longer in the study and has not seen Rheumatology since prior to being in the study. Will refer to [facility] Rheumatology as below. Prednisone given to calm down current flare.  Does have Oxycodone IR 10mg  for prn severe pain. Will get pain management drug screen as below.  - Ambulatory referral to Rheumatology - predniSONE (DELTASONE) 10 MG tablet; Take 6 tablets PO on day 1 and day 2, take 5 tablets PO on day 3 and day 4, take 4 tablets PO on day 5 and day 6, take 3 tablets PO on day 7 and day 8, take 2 tablets PO on day 9 and day 10, take one tablet PO on day 11 and day 12.  Dispense: 42 tablet; Refill: 0 - Pain Mgt Scrn (14 Drugs), Ur - CBC w/Diff/Platelet - Comprehensive Metabolic Panel (CMET)  3. Essential hypertension Stable. Continue Losartan 100mg , amlodipine 5mg , HCTZ 25 mg. Also followed by Cardiology, Dr. Harrell Gave. Will check labs as below and f/u pending results. - CBC w/Diff/Platelet - Comprehensive Metabolic Panel (CMET) - HgB A1c  4. Encounter for  chronic pain management See above medical treatment plan for #2.  - Pain Mgt Scrn (14 Drugs), Ur  5. Chronic pain syndrome See above medical treatment plan for #2.  - Pain Mgt Scrn (14 Drugs), Ur  6. Elevated glucose Will check labs as below and f/u pending results. - HgB A1c     [name], PA-C  [facility]

## 2019-11-15 ENCOUNTER — Ambulatory Visit (INDEPENDENT_AMBULATORY_CARE_PROVIDER_SITE_OTHER): Payer: Medicare Other | Admitting: Physician Assistant

## 2019-11-15 ENCOUNTER — Encounter: Payer: Self-pay | Admitting: Physician Assistant

## 2019-11-15 VITALS — BP 120/75 | HR 97 | Temp 97.3°F | Resp 16 | Wt 208.2 lb

## 2019-11-15 DIAGNOSIS — I10 Essential (primary) hypertension: Secondary | ICD-10-CM

## 2019-11-15 DIAGNOSIS — G8929 Other chronic pain: Secondary | ICD-10-CM | POA: Diagnosis not present

## 2019-11-15 DIAGNOSIS — Z3042 Encounter for surveillance of injectable contraceptive: Secondary | ICD-10-CM

## 2019-11-15 DIAGNOSIS — M069 Rheumatoid arthritis, unspecified: Secondary | ICD-10-CM

## 2019-11-15 DIAGNOSIS — G894 Chronic pain syndrome: Secondary | ICD-10-CM | POA: Diagnosis not present

## 2019-11-15 DIAGNOSIS — R7309 Other abnormal glucose: Secondary | ICD-10-CM

## 2019-11-15 MED ORDER — MEDROXYPROGESTERONE ACETATE 150 MG/ML IM SUSY: 1.0000 mL | PREFILLED_SYRINGE | INTRAMUSCULAR | 3 refills | Status: DC

## 2019-11-15 MED ORDER — PREDNISONE 10 MG PO TABS: ORAL_TABLET | ORAL | 0 refills | Status: DC

## 2019-11-15 NOTE — Patient Instructions (Signed)

## 2019-11-16 ENCOUNTER — Telehealth: Payer: Self-pay

## 2019-11-16 NOTE — Telephone Encounter (Signed)
Copied from [place] [id number]. Topic: General - Inquiry >> Nov 16, 2019  2:55 PM [name], NT wrote: Reason for CRM: Patient called in stating she would like to speak to nurse, in regards to coming in for labs, urine test, and depo shot. Please advise

## 2019-11-17 ENCOUNTER — Ambulatory Visit (INDEPENDENT_AMBULATORY_CARE_PROVIDER_SITE_OTHER): Payer: Medicare Other | Admitting: Family Medicine

## 2019-11-17 ENCOUNTER — Encounter: Payer: Self-pay | Admitting: Family Medicine

## 2019-11-17 DIAGNOSIS — R7309 Other abnormal glucose: Secondary | ICD-10-CM | POA: Diagnosis not present

## 2019-11-17 DIAGNOSIS — M069 Rheumatoid arthritis, unspecified: Secondary | ICD-10-CM | POA: Diagnosis not present

## 2019-11-17 DIAGNOSIS — Z3042 Encounter for surveillance of injectable contraceptive: Secondary | ICD-10-CM

## 2019-11-17 DIAGNOSIS — I10 Essential (primary) hypertension: Secondary | ICD-10-CM | POA: Diagnosis not present

## 2019-11-17 MED ORDER — MEDROXYPROGESTERONE ACETATE 150 MG/ML IM SUSP
150.0000 mg | Freq: Once | INTRAMUSCULAR | Status: AC
  Administered 2019-11-17: 150 mg via INTRAMUSCULAR

## 2019-11-17 NOTE — Progress Notes (Signed)
Patient here today for injectable contraceptive. Patient reports feeling well. Next appt 02/07/2020 at 8am.   I did not examine the patient.  I did review his/her medical history, medications, and allergies and vaccine consent form.  CMA gave injection. Patient tolerated well.  Virginia Crews, MD, MPH [facility] 11/17/2019 4:32 PM

## 2019-11-18 ENCOUNTER — Telehealth: Payer: Self-pay

## 2019-11-18 LAB — CBC WITH DIFFERENTIAL/PLATELET
Basophils Absolute: 0.1 10*3/uL (ref 0.0–0.2)
Basos: 1 %
EOS (ABSOLUTE): 0 10*3/uL (ref 0.0–0.4)
Eos: 0 %
Hematocrit: 34.9 % (ref 34.0–46.6)
Hemoglobin: 11.2 g/dL (ref 11.1–15.9)
Immature Grans (Abs): 0.2 10*3/uL — ABNORMAL HIGH (ref 0.0–0.1)
Immature Granulocytes: 2 %
Lymphocytes Absolute: 0.7 10*3/uL (ref 0.7–3.1)
Lymphs: 7 %
MCH: 25.9 pg — ABNORMAL LOW (ref 26.6–33.0)
MCHC: 32.1 g/dL (ref 31.5–35.7)
MCV: 81 fL (ref 79–97)
Monocytes Absolute: 0.3 10*3/uL (ref 0.1–0.9)
Monocytes: 3 %
Neutrophils Absolute: 9.1 10*3/uL — ABNORMAL HIGH (ref 1.4–7.0)
Neutrophils: 87 %
Platelets: 259 10*3/uL (ref 150–450)
RBC: 4.32 x10E6/uL (ref 3.77–5.28)
RDW: 18.7 % — ABNORMAL HIGH (ref 11.7–15.4)
WBC: 10.3 10*3/uL (ref 3.4–10.8)

## 2019-11-18 LAB — COMPREHENSIVE METABOLIC PANEL
ALT: 24 IU/L (ref 0–32)
AST: 17 IU/L (ref 0–40)
Albumin/Globulin Ratio: 1.5 (ref 1.2–2.2)
Albumin: 4.1 g/dL (ref 3.8–4.9)
Alkaline Phosphatase: 133 IU/L — ABNORMAL HIGH (ref 39–117)
BUN/Creatinine Ratio: 21 (ref 9–23)
BUN: 17 mg/dL (ref 6–24)
Bilirubin Total: 0.2 mg/dL (ref 0.0–1.2)
CO2: 20 mmol/L (ref 20–29)
Calcium: 8.9 mg/dL (ref 8.7–10.2)
Chloride: 105 mmol/L (ref 96–106)
Creatinine, Ser: 0.8 mg/dL (ref 0.57–1.00)
GFR calc Af Amer: 98 mL/min/{1.73_m2} (ref >59)
GFR calc non Af Amer: 85 mL/min/{1.73_m2} (ref >59)
Globulin, Total: 2.7 g/dL (ref 1.5–4.5)
Glucose: 120 mg/dL — ABNORMAL HIGH (ref 65–99)
Potassium: 3.1 mmol/L — ABNORMAL LOW (ref 3.5–5.2)
Sodium: 143 mmol/L (ref 134–144)
Total Protein: 6.8 g/dL (ref 6.0–8.5)

## 2019-11-18 LAB — HEMOGLOBIN A1C
Est. average glucose Bld gHb Est-mCnc: 120 mg/dL
Hgb A1c MFr Bld: 5.8 % — ABNORMAL HIGH (ref 4.8–5.6)

## 2019-11-18 NOTE — Telephone Encounter (Signed)
-----   Message from [name], [place] sent at 11/18/2019  7:19 AM EDT ----- All labs are within normal limits and stable.  Thanks! -JB

## 2019-11-18 NOTE — Telephone Encounter (Signed)
Pt advised.   Thanks,   -Lyna Laningham  

## 2019-11-22 NOTE — Progress Notes (Deleted)
Patient: Susan Gilmore, Female    DOB: 1966/07/29, 54 y.o.   MRN: [id number] Visit Date: 11/22/2019  Today's Provider: [name], PA-C   No chief complaint on file.  Subjective:     Complete Physical Finleigh KATHEE HUTCHESON is a 54 y.o. female. She feels {DESC; WELL/FAIRLY WELL/POORLY:18703}. She reports exercising ***. She reports she is sleeping {DESC; WELL/FAIRLY WELL/POORLY:18703}.  -----------------------------------------------------------   Review of Systems  Social History   Socioeconomic History  . Marital status: Married    Spouse name: Not on file  . Number of children: 1  . Years of education: Not on file  . Highest education level: 10th grade  Occupational History  . Occupation: disability  Tobacco Use  . Smoking status: Former Smoker    Packs/day: 1.00    Years: 26.00    Pack years: 26.00    Types: Cigarettes    Quit date: 07/26/2013    Years since quitting: 6.3  . Smokeless tobacco: Never Used  Substance and Sexual Activity  . Alcohol use: No    Alcohol/week: 0.0 standard drinks    Comment: Had a history of alcohol use but currently is not drinking any  . Drug use: No  . Sexual activity: Yes    Birth control/protection: Injection    Comment: depo  Other Topics Concern  . Not on file  Social History Narrative   The patient is married. She has one stepson. She does not work outside the home.    Social Determinants of Health   Financial Resource Strain: Low Risk   . Difficulty of Paying Living Expenses: Not hard at all  Food Insecurity: No Food Insecurity  . Worried About Charity fundraiser in the Last Year: Never true  . Ran Out of Food in the Last Year: Never true  Transportation Needs: No Transportation Needs  . Lack of Transportation (Medical): No  . Lack of Transportation (Non-Medical): No  Physical Activity: Inactive  . Days of Exercise per Week: 0 days  . Minutes of Exercise per Session: 0 min  Stress: Stress Concern  Present  . Feeling of Stress : Very much  Social Connections: Slightly Isolated  . Frequency of Communication with Friends and Family: More than three times a week  . Frequency of Social Gatherings with Friends and Family: More than three times a week  . Attends Religious Services: More than 4 times per year  . Active Member of Clubs or Organizations: No  . Attends Archivist Meetings: Never  . Marital Status: Married  Human resources officer Violence: Not At Risk  . Fear of Current or Ex-Partner: No  . Emotionally Abused: No  . Physically Abused: No  . Sexually Abused: No    Past Medical History:  Diagnosis Date  . Abnormal Pap smear 04/28/2012   normal pap /positive hpv   . Abnormal stress test   . Allergic rhinitis   . Bipolar disorder ([place])   . Carpal tunnel syndrome   . Chest pain   . Chronic anxiety   . Contraception   . Cough   . HPV (human papilloma virus) anogenital infection   . HTN (hypertension)   . Hyperplastic colon polyp   . Insomnia   . Palpitations   . Pneumonia      Patient Active Problem List   Diagnosis Date Noted  . OSA on CPAP 05/03/2019  . Coronary artery disease due to calcified coronary lesion 10/23/2018  . Latent  tuberculosis by blood test 06/12/2017  . Encounter for chronic pain management 12/13/2015  . Chronic pain 12/13/2015  . Asthma 06/05/2015  . Rheumatoid arthritis ([place]) 06/05/2015  . 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 Allergic rhinitis 03/15/2015  . ADD (attention deficit disorder) 12/29/2014  . 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 Back ache 12/29/2014  . Bipolar disorder ([place]) 12/29/2014  . Carpal tunnel syndrome 12/29/2014  . Chest pain 12/29/2014  . Chronic anxiety 12/29/2014  . Elevated blood sugar 12/29/2014  . Elevated rheumatoid factor 12/29/2014  . Acid reflux 12/29/2014  . Infectious human wart virus 12/29/2014  . Benign neoplasm of colon 12/29/2014  . Cephalalgia 12/29/2014  . Cannot sleep  12/29/2014  . Awareness of heartbeats 12/29/2014  . LAD (lymphadenopathy), retroperitoneal 12/29/2014  . Fast heart beat 12/29/2014  . Avitaminosis D 12/29/2014  . Paroxysmal digital cyanosis 02/17/2014  . Ventricular bigeminy 01/14/2012  . Hypertension 01/14/2012  . Abnormal EKG 01/14/2012    Past Surgical History:  Procedure Laterality Date  . full mouth dental    . GANGLION CYST EXCISION    . WISDOM TOOTH EXTRACTION      Her family history includes Diabetes in her maternal grandmother and mother; Heart attack in her mother; Heart disease in her father, maternal grandfather, maternal grandmother, and paternal grandmother; Hyperlipidemia in her father; Hypertension in her father, maternal grandfather, and maternal grandmother; Stroke in her father and paternal grandmother.   Current Outpatient Medications:  .  albuterol (PROAIR HFA) 108 (90 Base) MCG/ACT inhaler, Inhale 1-2 puffs into the lungs every 6 (six) hours as needed for wheezing or shortness of breath. (Patient not taking: Reported on 11/15/2019), Disp: 18 g, Rfl: 11 .  ALPRAZolam (XANAX) 1 MG tablet, Take 1 mg by mouth 4 (four) times daily as needed for anxiety. , Disp: , Rfl:  .  amLODipine (NORVASC) 5 MG tablet, Take 1 tablet (5 mg total) by mouth daily., Disp: 90 tablet, Rfl: 3 .  amphetamine-dextroamphetamine (ADDERALL XR) 30 MG 24 hr capsule, Take 60 mg by mouth daily., Disp: , Rfl: 0 .  aspirin EC 81 MG tablet, Take 1 tablet (81 mg total) by mouth daily., Disp: 90 tablet, Rfl: 3 .  atorvastatin (LIPITOR) 40 MG tablet, TAKE 1 TABLET BY MOUTH EVERY DAY, Disp: 90 tablet, Rfl: 3 .  DULoxetine (CYMBALTA) 60 MG capsule, Take 120 mg by mouth daily. , Disp: , Rfl:  .  fluticasone (FLONASE) 50 MCG/ACT nasal spray, Place 2 sprays into both nostrils daily. (Patient taking differently: Place 2 sprays into both nostrils 2 (two) times daily. ), Disp: 16 g, Rfl: 11 .  Fluticasone-Salmeterol (ADVAIR) 250-50 MCG/DOSE AEPB, Inhale 1 puff  into the lungs 2 (two) times daily., Disp: , Rfl:  .  hydrochlorothiazide (HYDRODIURIL) 25 MG tablet, TAKE 1 TABLET BY MOUTH EVERY DAY, Disp: 90 tablet, Rfl: 0 .  ipratropium-albuterol (DUONEB) 0.5-2.5 (3) MG/3ML SOLN, TAKE 3 MLS BY NEBULIZATION EVERY 6 (SIX) HOURS AS NEEDED (SHORTNESS OF BREATH)., Disp: 1080 mL, Rfl: 1 .  loratadine (CLARITIN) 10 MG tablet, Take 1 tablet by mouth daily as needed for allergies. , Disp: , Rfl:  .  losartan (COZAAR) 100 MG tablet, TAKE 1 TABLET BY MOUTH EVERY DAY, Disp: 90 tablet, Rfl: 1 .  medroxyPROGESTERone Acetate 150 MG/ML SUSY, Inject 1 mL (150 mg total) into the muscle every 3 (three) months., Disp: 1 mL, Rfl: 3 .  montelukast (SINGULAIR) 10 MG tablet, Take 1 tablet by mouth daily as needed. , Disp: ,  Rfl:  .  omeprazole (PRILOSEC) 20 MG capsule, Take 1 capsule (20 mg total) by mouth daily. (Patient taking differently: Take 20 mg by mouth daily. As needed), Disp: 30 capsule, Rfl: 11 .  Oxycodone HCl 10 MG TABS, Take 1 tablet (10 mg total) by mouth every 6 (six) hours as needed. For severe pain, Disp: 120 tablet, Rfl: 0 .  potassium citrate (UROCIT-K) 10 MEQ (1080 MG) SR tablet, Take 1 tablet (10 mEq total) by mouth daily., Disp: 90 tablet, Rfl: 1 .  predniSONE (DELTASONE) 10 MG tablet, Take 6 tablets PO on day 1 and day 2, take 5 tablets PO on day 3 and day 4, take 4 tablets PO on day 5 and day 6, take 3 tablets PO on day 7 and day 8, take 2 tablets PO on day 9 and day 10, take one tablet PO on day 11 and day 12., Disp: 42 tablet, Rfl: 0 .  zolpidem (AMBIEN) 10 MG tablet, Take 10 mg by mouth at bedtime. , Disp: , Rfl: 0  Patient Care Team: Rubye Beach as PCP - General (Physician Assistant) [name], MD as PCP - Cardiology (Cardiology) Chucky May, MD as Consulting Physician (Psychiatry)     Objective:    Vitals: There were no vitals taken for this visit.  Physical Exam  Activities of Daily Living In your present state of  health, do you have any difficulty performing the following activities: 08/24/2019  Hearing? N  Vision? N  Difficulty concentrating or making decisions? N  Walking or climbing stairs? Y  Comment Due to RA in knees and SOB.  Dressing or bathing? N  Doing errands, shopping? N  Preparing Food and eating ? N  Using the Toilet? N  In the past six months, have you accidently leaked urine? Y  Comment Rarely when coughing.  Do you have problems with loss of bowel control? N  Managing your Medications? N  Managing your Finances? N  Housekeeping or managing your Housekeeping? N  Some recent data might be hidden    Fall Risk Assessment Fall Risk  08/24/2019 02/16/2018 06/12/2017 02/13/2017 03/06/2016  Falls in the past year? 0 Yes Yes No No  Number falls in past yr: 0 1 1 - -  Injury with Fall? 0 Yes Yes - -  Comment - sprained ankle - - -  Follow up - Falls prevention discussed - - -     Depression Screen PHQ 2/9 Scores 08/24/2019 08/24/2019 02/16/2018 02/13/2017  PHQ - 2 Score 0 0 0 4  PHQ- 9 Score - - - 8    6CIT Screen 02/13/2017  What Year? 0 points  What month? 0 points  What time? 0 points  Count back from 20 0 points  Months in reverse 0 points  Repeat phrase 10 points  Total Score 10       Assessment & Plan:    Annual Physical Reviewed patient's Family Medical History Reviewed and updated list of patient's medical providers Assessment of cognitive impairment was done Assessed patient's functional ability Established a written schedule for health screening [place] Completed and Reviewed  Exercise Activities and Dietary recommendations Goals    . DIET - DECREASE SODA OR JUICE INTAKE     Recommend decreasing amount of soda by half and not exceeding 3 cans a day.     . Increase water intake     Recommend increasing water intake to 4-6 glasses a day.  Immunization History  Administered Date(s) Administered  . Influenza, Seasonal,  Injecte, Preservative Fre 06/04/2017  . Influenza,inj,Quad PF,6+ Mos 06/05/2015, 07/19/2019  . Influenza-Unspecified 06/01/2014, 06/01/2018  . Pneumococcal Conjugate-13 06/05/2015  . Pneumococcal Polysaccharide-23 07/17/2012  . Td 07/12/2004  . Tdap 09/28/2010    Health Maintenance  Topic Date Due  . MAMMOGRAM  Never done  . COLONOSCOPY  Never done  . PAP SMEAR-Modifier  03/05/2018  . TETANUS/TDAP  09/28/2020  . INFLUENZA VACCINE  Completed  . HIV Screening  Completed     Discussed health benefits of physical activity, and encouraged her to engage in regular exercise appropriate for her age and condition.    ------------------------------------------------------------------------------------------------------------    [name], PA-C  [place]

## 2019-11-25 ENCOUNTER — Encounter: Payer: Medicare Other | Admitting: Physician Assistant

## 2019-12-08 NOTE — Telephone Encounter (Signed)
Patient is returning  A call to the office for Sarah.  Please call patient back at [phone]

## 2019-12-09 ENCOUNTER — Other Ambulatory Visit: Payer: Self-pay | Admitting: Physician Assistant

## 2019-12-09 DIAGNOSIS — M069 Rheumatoid arthritis, unspecified: Secondary | ICD-10-CM

## 2019-12-09 MED ORDER — PREDNISONE 10 MG PO TABS: ORAL_TABLET | ORAL | 0 refills | Status: DC

## 2019-12-09 NOTE — Telephone Encounter (Signed)
Patient requesting predniSONE (DELTASONE) 10 MG tablet refill due to Rheumatoid arthritis flare up. PCP referred patient to a specialist and there unable to see her until May. Patient seeking Rx to hold her over until specialist appt    [id number] [place], [place] - [address] Phone:  [phone]  Fax:  [phone]

## 2019-12-09 NOTE — Telephone Encounter (Signed)
Requested medication (s) are due for refill today:   Pt request to hold over until specialist appt in May  Requested medication (s) are on the active medication list:   Yes  Future visit scheduled:   Has appt with specialist for her Rheumatoid arthritis in May.  [id number]   Last ordered: 11/15/2019  #42 0 refills.  Clinic note:   Returned because pt requesting refills to get her through until she sees the specialist for her Rheumatoid arthritis in May 2021.    Also no refill protocol for this medication.  Thanks.   Requested Prescriptions  Pending Prescriptions Disp Refills   predniSONE (DELTASONE) 10 MG tablet 42 tablet 0    Sig: Take 6 tablets PO on day 1 and day 2, take 5 tablets PO on day 3 and day 4, take 4 tablets PO on day 5 and day 6, take 3 tablets PO on day 7 and day 8, take 2 tablets PO on day 9 and day 10, take one tablet PO on day 11 and day 12.      There is no refill protocol information for this order

## 2019-12-13 ENCOUNTER — Other Ambulatory Visit: Payer: Self-pay | Admitting: Physician Assistant

## 2019-12-13 DIAGNOSIS — M069 Rheumatoid arthritis, unspecified: Secondary | ICD-10-CM

## 2019-12-13 DIAGNOSIS — G8929 Other chronic pain: Secondary | ICD-10-CM

## 2019-12-13 NOTE — Telephone Encounter (Signed)
Medication Refill - Medication: Oxycodone HCl 10 MG TABS   Has the patient contacted their pharmacy? No. (Agent: If no, request that the patient contact the pharmacy for the refill.) (Agent: If yes, when and what did the pharmacy advise?)  Preferred Pharmacy (with phone number or street name): [id number] [place], [place] - [address] [place]  [address], [place] [address]  Phone:  [phone] Fax:  [phone]   Agent: Please be advised that RX refills may take up to 3 business days. We ask that you follow-up with your pharmacy.

## 2019-12-14 NOTE — Telephone Encounter (Signed)
Pt was able to move up her Rheumatoid appt with the other provider but called to see if Rx for pain meds were sent to the pharmacy /please advise

## 2019-12-15 MED ORDER — OXYCODONE HCL 10 MG PO TABS: 10.0000 mg | ORAL_TABLET | Freq: Four times a day (QID) | ORAL | 0 refills | Status: DC | PRN

## 2019-12-16 ENCOUNTER — Other Ambulatory Visit: Payer: Self-pay | Admitting: Physician Assistant

## 2019-12-16 DIAGNOSIS — M19071 Primary osteoarthritis, right ankle and foot: Secondary | ICD-10-CM | POA: Diagnosis not present

## 2019-12-16 DIAGNOSIS — Z7952 Long term (current) use of systemic steroids: Secondary | ICD-10-CM | POA: Insufficient documentation

## 2019-12-16 DIAGNOSIS — M069 Rheumatoid arthritis, unspecified: Secondary | ICD-10-CM | POA: Diagnosis not present

## 2019-12-16 DIAGNOSIS — J4551 Severe persistent asthma with (acute) exacerbation: Secondary | ICD-10-CM

## 2019-12-16 DIAGNOSIS — Z1159 Encounter for screening for other viral diseases: Secondary | ICD-10-CM | POA: Diagnosis not present

## 2019-12-16 DIAGNOSIS — M19042 Primary osteoarthritis, left hand: Secondary | ICD-10-CM | POA: Diagnosis not present

## 2019-12-16 DIAGNOSIS — M19072 Primary osteoarthritis, left ankle and foot: Secondary | ICD-10-CM | POA: Diagnosis not present

## 2019-12-16 DIAGNOSIS — J9621 Acute and chronic respiratory failure with hypoxia: Secondary | ICD-10-CM

## 2019-12-16 DIAGNOSIS — M19041 Primary osteoarthritis, right hand: Secondary | ICD-10-CM | POA: Diagnosis not present

## 2019-12-16 NOTE — Telephone Encounter (Signed)
Requested  medications are  due for refill today yes  Requested medications are on the active medication list yes  Last refill 1/25    Notes to clinic : This is noted on the Rx and I am not sure what to do with it as far as refill or not. The medication was pended with free-text dispense values. You will have to enter discrete dispense values to sign the order.

## 2020-01-05 ENCOUNTER — Telehealth: Payer: Self-pay | Admitting: *Deleted

## 2020-01-05 NOTE — Addendum Note (Signed)
Addended by: [name] on: 01/05/2020 12:14 PM   Modules accepted: Orders

## 2020-01-05 NOTE — Telephone Encounter (Signed)
Copied from [place] [id number]. Topic: Referral - Question >> Jan 05, 2020 10:03 AM [name], NT wrote: Reason for CRM: [facility] Rheumatology called stating that they were wondering if office would like to close referral, as patient is being seen at [facility] and has been following up there. Please advise. Call back is [phone].

## 2020-01-05 NOTE — Telephone Encounter (Signed)
Referral canceled °

## 2020-01-06 ENCOUNTER — Other Ambulatory Visit: Payer: Self-pay | Admitting: Physician Assistant

## 2020-01-06 DIAGNOSIS — I10 Essential (primary) hypertension: Secondary | ICD-10-CM

## 2020-01-12 ENCOUNTER — Other Ambulatory Visit: Payer: Self-pay | Admitting: Physician Assistant

## 2020-01-12 DIAGNOSIS — G8929 Other chronic pain: Secondary | ICD-10-CM

## 2020-01-12 DIAGNOSIS — M069 Rheumatoid arthritis, unspecified: Secondary | ICD-10-CM

## 2020-01-12 MED ORDER — OXYCODONE HCL 10 MG PO TABS: 10.0000 mg | ORAL_TABLET | Freq: Four times a day (QID) | ORAL | 0 refills | Status: DC | PRN

## 2020-01-12 NOTE — Telephone Encounter (Signed)
Requested medication (s) are due for refill today - yes  Requested medication (s) are on the active medication list -yes  Future visit scheduled -yes  Last refill: 12/15/19  Notes to clinic: request for non delegated Rx  Requested Prescriptions  Pending Prescriptions Disp Refills   Oxycodone HCl 10 MG TABS 120 tablet 0    Sig: Take 1 tablet (10 mg total) by mouth every 6 (six) hours as needed. For severe pain      Not Delegated - Analgesics:  Opioid Agonists Failed - 01/12/2020 11:48 AM      Failed - This refill cannot be delegated      Failed - Urine Drug Screen completed in last 360 days.      Passed - Valid encounter within last 6 months    Recent Outpatient Visits           1 month ago Encounter for surveillance of injectable contraceptive   [facility] [place], Dionne Bucy, MD   1 month ago Rheumatoid arthritis involving multiple sites, unspecified whether rheumatoid factor present [facility])   [place], Clearnce Sorrel, PA-C   1 year ago COPD with acute exacerbation [facility])   [place], [place], [place]   1 year ago Acute cystitis without hematuria   [place], Clearnce Sorrel, PA-C   1 year ago Encounter for surveillance of injectable contraceptive   [place], PA-C       Future Appointments             In 3 weeks Marlyn Corporal, Clearnce Sorrel, PA-C [employer], [facility]                Requested Prescriptions  Pending Prescriptions Disp Refills   Oxycodone HCl 10 MG TABS 120 tablet 0    Sig: Take 1 tablet (10 mg total) by mouth every 6 (six) hours as needed. For severe pain      Not Delegated - Analgesics:  Opioid Agonists Failed - 01/12/2020 11:48 AM      Failed - This refill cannot be delegated      Failed - Urine Drug Screen completed in last 360 days.      Passed - Valid encounter within last 6 months    Recent Outpatient Visits            1 month ago Encounter for surveillance of injectable contraceptive   [facility] [place], Dionne Bucy, MD   1 month ago Rheumatoid arthritis involving multiple sites, unspecified whether rheumatoid factor present [facility])   [place], Clearnce Sorrel, PA-C   1 year ago COPD with acute exacerbation [facility])   [place], [place], [place]   1 year ago Acute cystitis without hematuria   [place], Clearnce Sorrel, [place]   1 year ago Encounter for surveillance of injectable contraceptive   [place], PA-C       Future Appointments             In 3 weeks Marlyn Corporal, Clearnce Sorrel, PA-C [employer], [place]

## 2020-01-12 NOTE — Telephone Encounter (Signed)
Copied from [place] [id number]. Topic: Quick Communication - Rx Refill/Question >> Jan 12, 2020 11:44 AM Yvette Rack wrote: Medication: Oxycodone HCl 10 MG TABS  Has the patient contacted their pharmacy? no  Preferred Pharmacy (with phone number or street name): [id number] [place], [place] - [address]  Phone: [phone]  Fax: [phone]  Agent: Please be advised that RX refills may take up to 3 business days. We ask that you follow-up with your pharmacy.

## 2020-02-07 ENCOUNTER — Ambulatory Visit: Payer: Self-pay | Admitting: Physician Assistant

## 2020-02-10 ENCOUNTER — Other Ambulatory Visit: Payer: Self-pay | Admitting: Physician Assistant

## 2020-02-10 DIAGNOSIS — G8929 Other chronic pain: Secondary | ICD-10-CM

## 2020-02-10 DIAGNOSIS — M069 Rheumatoid arthritis, unspecified: Secondary | ICD-10-CM

## 2020-02-10 MED ORDER — OXYCODONE HCL 10 MG PO TABS: 10.0000 mg | ORAL_TABLET | Freq: Four times a day (QID) | ORAL | 0 refills | Status: DC | PRN

## 2020-02-10 NOTE — Addendum Note (Signed)
Addended by: Denman George on: 02/10/2020 04:02 PM   Modules accepted: Orders

## 2020-02-10 NOTE — Telephone Encounter (Signed)
PT needs a refill  Oxycodone HCl 10 MG TABS [[id number]]  [id number] [place], [place] - [address]  [address] [place] [address]  Phone: [phone] Fax: [phone]

## 2020-02-15 DIAGNOSIS — Z7952 Long term (current) use of systemic steroids: Secondary | ICD-10-CM | POA: Diagnosis not present

## 2020-02-15 DIAGNOSIS — Z79899 Other long term (current) drug therapy: Secondary | ICD-10-CM | POA: Diagnosis not present

## 2020-02-15 DIAGNOSIS — M069 Rheumatoid arthritis, unspecified: Secondary | ICD-10-CM | POA: Diagnosis not present

## 2020-03-03 DIAGNOSIS — M0579 Rheumatoid arthritis with rheumatoid factor of multiple sites without organ or systems involvement: Secondary | ICD-10-CM | POA: Diagnosis not present

## 2020-03-03 DIAGNOSIS — Z79899 Other long term (current) drug therapy: Secondary | ICD-10-CM | POA: Diagnosis not present

## 2020-03-10 ENCOUNTER — Other Ambulatory Visit: Payer: Self-pay | Admitting: Physician Assistant

## 2020-03-10 DIAGNOSIS — G8929 Other chronic pain: Secondary | ICD-10-CM

## 2020-03-10 DIAGNOSIS — M069 Rheumatoid arthritis, unspecified: Secondary | ICD-10-CM

## 2020-03-10 MED ORDER — OXYCODONE HCL 10 MG PO TABS: 10.0000 mg | ORAL_TABLET | Freq: Four times a day (QID) | ORAL | 0 refills | Status: DC | PRN

## 2020-03-10 NOTE — Telephone Encounter (Signed)
PT need a refill  Oxycodone HCl 10 MG TABS [[id number]] [id number] [place], [place] - [address]  [address] [place] [address]  Phone: [phone] Fax: [phone]

## 2020-03-10 NOTE — Telephone Encounter (Signed)
Please review for refill. Refill not delegated per protocol.  Last refill : 02/10/20  #120

## 2020-03-18 ENCOUNTER — Other Ambulatory Visit: Payer: Self-pay | Admitting: Physician Assistant

## 2020-03-18 DIAGNOSIS — I10 Essential (primary) hypertension: Secondary | ICD-10-CM

## 2020-03-18 NOTE — Telephone Encounter (Signed)
Requested Prescriptions  Pending Prescriptions Disp Refills   losartan (COZAAR) 100 MG tablet [Pharmacy Med Name: LOSARTAN POTASSIUM 100 MG TAB] 90 tablet 0    Sig: TAKE 1 TABLET BY MOUTH EVERY DAY     Cardiovascular:  Angiotensin Receptor Blockers Failed - 03/18/2020 10:21 AM      Failed - K in normal range and within 180 days    Potassium  Date Value Ref Range Status  11/17/2019 3.1 (L) 3.5 - 5.2 mmol/L Final  06/22/2014 3.6 3.5 - 5.1 mmol/L Final         Passed - Cr in normal range and within 180 days    Creat  Date Value Ref Range Status  06/12/2017 0.94 0.50 - 1.10 mg/dL Final   Creatinine, Ser  Date Value Ref Range Status  11/17/2019 0.80 0.57 - 1.00 mg/dL Final         Passed - Patient is not pregnant      Passed - Last BP in normal range    BP Readings from Last 1 Encounters:  11/15/19 120/75         Passed - Valid encounter within last 6 months    Recent Outpatient Visits          4 months ago Encounter for surveillance of injectable contraceptive   [employer], Dionne Bucy, MD   4 months ago Rheumatoid arthritis involving multiple sites, unspecified whether rheumatoid factor present [facility])   [place], Clearnce Sorrel, PA-C   1 year ago COPD with acute exacerbation [facility])   [place], [place], [place]   1 year ago Acute cystitis without hematuria   [place], Clearnce Sorrel, [place]   1 year ago Encounter for surveillance of injectable contraceptive   [employer], Clearnce Sorrel, [place]      Future Appointments            In 2 weeks [name], MD [facility] [place], [facility]

## 2020-04-04 ENCOUNTER — Other Ambulatory Visit: Payer: Self-pay | Admitting: Cardiology

## 2020-04-04 ENCOUNTER — Other Ambulatory Visit: Payer: Self-pay | Admitting: Physician Assistant

## 2020-04-04 DIAGNOSIS — I10 Essential (primary) hypertension: Secondary | ICD-10-CM

## 2020-04-04 NOTE — Telephone Encounter (Signed)
Requested Prescriptions  Pending Prescriptions Disp Refills   hydrochlorothiazide (HYDRODIURIL) 25 MG tablet [Pharmacy Med Name: HYDROCHLOROTHIAZIDE 25 MG TAB] 90 tablet 0    Sig: TAKE 1 TABLET BY MOUTH EVERY DAY     Cardiovascular: Diuretics - Thiazide Failed - 04/04/2020  1:47 AM      Failed - K in normal range and within 360 days    Potassium  Date Value Ref Range Status  11/17/2019 3.1 (L) 3.5 - 5.2 mmol/L Final  06/22/2014 3.6 3.5 - 5.1 mmol/L Final         Passed - Ca in normal range and within 360 days    Calcium  Date Value Ref Range Status  11/17/2019 8.9 8.7 - 10.2 mg/dL Final   Calcium, Total  Date Value Ref Range Status  06/22/2014 8.1 (L) 8.5 - 10.1 mg/dL Final         Passed - Cr in normal range and within 360 days    Creat  Date Value Ref Range Status  06/12/2017 0.94 0.50 - 1.10 mg/dL Final   Creatinine, Ser  Date Value Ref Range Status  11/17/2019 0.80 0.57 - 1.00 mg/dL Final         Passed - Na in normal range and within 360 days    Sodium  Date Value Ref Range Status  11/17/2019 143 134 - 144 mmol/L Final  06/22/2014 143 136 - 145 mmol/L Final         Passed - Last BP in normal range    BP Readings from Last 1 Encounters:  11/15/19 120/75         Passed - Valid encounter within last 6 months    Recent Outpatient Visits          4 months ago Encounter for surveillance of injectable contraceptive   [employer], Dionne Bucy, MD   4 months ago Rheumatoid arthritis involving multiple sites, unspecified whether rheumatoid factor present [facility])   [place], Clearnce Sorrel, PA-C   1 year ago COPD with acute exacerbation [facility])   [place], [place], [place]   1 year ago Acute cystitis without hematuria   [place], Clearnce Sorrel, [place]   1 year ago Encounter for surveillance of injectable contraceptive   [employer], Clearnce Sorrel, PA-C       Future Appointments            In 3 days [name], MD [facility] [place], [facility]

## 2020-04-07 ENCOUNTER — Encounter: Payer: Self-pay | Admitting: Cardiology

## 2020-04-07 ENCOUNTER — Other Ambulatory Visit: Payer: Self-pay

## 2020-04-07 ENCOUNTER — Ambulatory Visit (INDEPENDENT_AMBULATORY_CARE_PROVIDER_SITE_OTHER): Payer: Medicare Other | Admitting: Cardiology

## 2020-04-07 VITALS — BP 130/70 | HR 103 | Temp 96.5°F | Ht 61.0 in | Wt 212.0 lb

## 2020-04-07 DIAGNOSIS — I2584 Coronary atherosclerosis due to calcified coronary lesion: Secondary | ICD-10-CM

## 2020-04-07 DIAGNOSIS — I2510 Atherosclerotic heart disease of native coronary artery without angina pectoris: Secondary | ICD-10-CM

## 2020-04-07 DIAGNOSIS — Z7189 Other specified counseling: Secondary | ICD-10-CM | POA: Diagnosis not present

## 2020-04-07 DIAGNOSIS — E782 Mixed hyperlipidemia: Secondary | ICD-10-CM | POA: Diagnosis not present

## 2020-04-07 DIAGNOSIS — I10 Essential (primary) hypertension: Secondary | ICD-10-CM

## 2020-04-07 NOTE — Patient Instructions (Signed)

## 2020-04-07 NOTE — Progress Notes (Signed)
Cardiology Office Note:    Date:  04/07/2020   ID:  Susan Gilmore, DOB April 13, 1966, MRN [id number]  PCP:  [name], PA-C  Cardiologist:  [name], MD PhD  Referring MD: [name]*   CC: follow up  History of Present Illness:    Susan Gilmore is a 54 y.o. female with a hx of rheumatoid arthritis, COPD who is seen in follow up. She was initially seen as a new consult on 04/24/18.   Cardiac history: initially seen in consult for elevated BNP (no echo). However, noted chest discomfort at that time. Nonexertional but dull, hadn't occurred recently. She was very short of breath. Had been a heavy smoker (2-3 ppd) but changed to vaping. Had risk factors for sleep apnea, was ordered for sleep study and now pending CPAP.  Remotely, was seen by Dr. [name] in 2012, reports having a normal cath (I can see the Ao pressures but not the anatomy on the uploaded document). She also saw Dr. [name] in 2013.  She reports a history of bigeminy (found in 2012-2013 incidentally). Rare issue for her, notices it when she lays down to sleep. Computer notes atrial fib/flutter in the past (she is not aware of this diagnosis, never on blood thinners). I personally reviewed all of her ECGs in the system and cannot find evidence of this. Had coronary CTA, was 99th percentile in calcium score. Se[facility] in [facility] 03/16/19 with aching pain that was worse with deep breathing or movement. HSTn ruled out ACS. ECG was sinus tach with ST depressions/TWI in multiple leads. This was similar to prior ECG from 04/24/18. She wasn't told what might be the cause, she thinks it was her arthritis.   Today: Only one episode of chest pressure, very brief. COPD has been an issue, had a flare this weekend. Better with her nebulizer/inhaler. Very expensive, has trouble affording her medications. We will ask our social worker to see if we can help.   Otherwise no edema, no syncope, no  PND/orthopnea.   Past Medical History:  Diagnosis Date  . Abnormal Pap smear 04/28/2012   normal pap /positive hpv   . Abnormal stress test   . Allergic rhinitis   . Bipolar disorder ([place])   . Carpal tunnel syndrome   . Chest pain   . Chronic anxiety   . Contraception   . Cough   . HPV (human papilloma virus) anogenital infection   . HTN (hypertension)   . Hyperplastic colon polyp   . Insomnia   . Palpitations   . Pneumonia     Past Surgical History:  Procedure Laterality Date  . full mouth dental    . GANGLION CYST EXCISION    . WISDOM TOOTH EXTRACTION      Current Medications: Current Outpatient Medications on File Prior to Visit  Medication Sig  . albuterol (PROAIR HFA) 108 (90 Base) MCG/ACT inhaler Inhale 1-2 puffs into the lungs every 6 (six) hours as needed for wheezing or shortness of breath.  . ALPRAZolam (XANAX) 1 MG tablet Take 1 mg by mouth 4 (four) times daily as needed for anxiety.   [name] amLODipine (NORVASC) 5 MG tablet TAKE 1 TABLET BY MOUTH EVERY DAY  . amphetamine-dextroamphetamine (ADDERALL XR) 30 MG 24 hr capsule TAKE 2 CAPSULES BY MOUTH IN THE MORNING  . aspirin EC 81 MG tablet Take 1 tablet (81 mg total) by mouth daily.  [name] atorvastatin (LIPITOR) 40 MG tablet TAKE 1 TABLET BY  MOUTH EVERY DAY  . DULoxetine (CYMBALTA) 60 MG capsule Take 120 mg by mouth daily.   . fluticasone (FLONASE) 50 MCG/ACT nasal spray Place 2 sprays into both nostrils daily. (Patient taking differently: Place 2 sprays into both nostrils 2 (two) times daily. )  . Fluticasone-Salmeterol (ADVAIR) 250-50 MCG/DOSE AEPB Inhale into the lungs.  . hydrochlorothiazide (HYDRODIURIL) 25 MG tablet TAKE 1 TABLET BY MOUTH EVERY DAY  . ipratropium-albuterol (DUONEB) 0.5-2.5 (3) MG/3ML SOLN TAKE 3 MLS BY NEBULIZATION EVERY 6 (SIX) HOURS AS NEEDED (SHORTNESS OF BREATH).  [name] leflunomide (ARAVA) 20 MG tablet Take 20 mg by mouth daily.  [name] loratadine (CLARITIN) 10 MG tablet Take 1 tablet by mouth daily as  needed for allergies.   [name] losartan (COZAAR) 100 MG tablet TAKE 1 TABLET BY MOUTH EVERY DAY  . montelukast (SINGULAIR) 10 MG tablet Take 1 tablet by mouth daily as needed.   [name] omeprazole (PRILOSEC) 20 MG capsule Take 1 capsule (20 mg total) by mouth daily. (Patient taking differently: Take 20 mg by mouth daily. As needed)  . Oxycodone HCl 10 MG TABS Take 1 tablet (10 mg total) by mouth every 6 (six) hours as needed. For severe pain  . potassium citrate (UROCIT-K) 10 MEQ (1080 MG) SR tablet Take 1 tablet (10 mEq total) by mouth daily.  . predniSONE (DELTASONE) 5 MG tablet Take by mouth.  . zolpidem (AMBIEN) 10 MG tablet Take 10 mg by mouth at bedtime.    No current facility-administered medications on file prior to visit.     Allergies:   Effexor [venlafaxine hcl] and Isosorbide nitrate   Social History   Tobacco Use  . Smoking status: Former Smoker    Packs/day: 1.00    Years: 26.00    Pack years: 26.00    Types: Cigarettes    Quit date: 07/26/2013    Years since quitting: 6.7  . Smokeless tobacco: Never Used  Vaping Use  . Vaping Use: Some days  Substance Use Topics  . Alcohol use: No    Alcohol/week: 0.0 standard drinks    Comment: Had a history of alcohol use but currently is not drinking any  . Drug use: No    Family History: The patient's family history includes Diabetes in her maternal grandmother and mother; Heart attack in her mother; Heart disease in her father, maternal grandfather, maternal grandmother, and paternal grandmother; Hyperlipidemia in her father; Hypertension in her father, maternal grandfather, and maternal grandmother; Stroke in her father and paternal grandmother.  ROS:   Please see the history of present illness.  Additional pertinent ROS negative except as documented.  EKGs/Labs/Other Studies Reviewed:    The following studies were reviewed today: Echo 05/04/18 - Left ventricle: The cavity size was normal. 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 Systolic function was normal. The   estimated ejection fraction was in the range of 60% to 65%. Wall   motion was normal; there were no regional wall motion   abnormalities. Doppler parameters are consistent with abnormal   left ventricular relaxation (grade 1 diastolic dysfunction). - Aortic valve: Poorly visualized. Probably trileaflet; mildly   calcified leaflets. There was no stenosis. - Mitral valve: There was no significant regurgitation. - Right ventricle: The cavity size was normal. Systolic function   was normal. - Pulmonary arteries: No complete TR doppler jet so unable to   estimate PA systolic pressure. - Inferior vena cava: The vessel was  normal in size. The   respirophasic diameter changes were in the normal range (>= 50%),   consistent with normal central venous pressure.  Impressions: - Normal LV size with prominent LV hypertrophy towards the apex.   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 EF 60-65%. Normal RV size and   systolic function. No significant valvular abnormalities.  CTA coronary 06/04/18 IMPRESSION: 1. Coronary calcium score of 210. This was 42 percentile for age and sex matched control.  2. Normal coronary origin with right dominance.  3. Moderate stenosis in the mid RCA and LAD. Additional analysis with CT FFR will be submitted.  1. Left Main:  No significant stenosis. 2. LAD: No significant stenosis. 3. LCX: No significant stenosis. 4. RCA: No significant stenosis.  IMPRESSION: 1. CT FFR analysis didn't show any significant stenosis. Aggressive medical management for non-obstructive CAD is recommended.   EKG:  EKG is personally reviewed today.  The ekg ordered today demonstrates sinus tachycardia at 102 bpm with PVCs and nonspecific ST changes  Recent Labs: 11/17/2019: ALT 24; BUN 17; Creatinine, Ser 0.80;  Hemoglobin 11.2; Platelets 259; Potassium 3.1; Sodium 143  Recent Lipid Panel    Component Value Date/Time   CHOL 84 (L) 10/23/2018 1127   CHOL 166 06/22/2014 1033   TRIG 74 10/23/2018 1127   TRIG 175 06/22/2014 1033   HDL 31 (L) 10/23/2018 1127   HDL 39 (L) 06/22/2014 1033   CHOLHDL 2.7 10/23/2018 1127   VLDL 35 06/22/2014 1033   LDLCALC 38 10/23/2018 1127   LDLCALC 92 06/22/2014 1033    Physical Exam:    VS:  BP 130/70   Pulse (!) 103   Temp (!) 96.5 F (35.8 C)   Ht 5\' 1"  (1.549 m)   Wt 212 lb (96.2 kg)   SpO2 97%   BMI 40.06 kg/m     Wt Readings from Last 3 Encounters:  04/07/20 212 lb (96.2 kg)  11/15/19 208 lb 3.2 oz (94.4 kg)  10/18/19 204 lb (92.5 kg)    GEN: Well nourished, well developed in no acute distress HEENT: Normal, moist mucous membranes NECK: No JVD CARDIAC: regular rhythm, normal S1 and S2, no rubs or gallops. No murmur. VASCULAR: Radial and DP pulses 2+ bilaterally. No carotid bruits RESPIRATORY:  Clear to auscultation without rales, wheezing or rhonchi  ABDOMEN: Soft, non-tender, non-distended MUSCULOSKELETAL:  Ambulates independently SKIN: Warm and dry, no edema NEUROLOGIC:  Alert and oriented x 3. No focal neuro deficits noted. PSYCHIATRIC:  Normal affect    ASSESSMENT:    1. Essential hypertension   2. Coronary artery disease due to calcified coronary lesion   3. Mixed hyperlipidemia   4. Counseling on health promotion and disease prevention    PLAN:    Chest pain, abnormal EKG--> coronary CTA with diffuse calcification, 99th percentile, with two localized lesions (FFR WNL). Consistent with coronary artery disease. Continues to have rare atypical chest pain, unlikely due to her nonobstructive CAD. -aggressive prevention.  -tolerating atorvastatin 40 mg. -reviewed red flag warning signs that need immediate medical attention -continue aspirin 81 mg.  Hypertension -just at goal today -continue amlodipine 5 mg, losartan 100 mg  daily -next med would be spironolactone given diastolic dysfunction. With her underlying lung disease, would avoid beta blocker, though this may also be beneficial for CAD  Mixed hyperlipidemia: -continue atorvastatin 40 mg daily -LDL went from 85 to 38 on atorvastatin 10/23/18, LFTs normal -TG also improved from 148-175 range  to 74 09/2018  Secondary Prevention: counseling, review of risk factors -recommend heart healthy/Mediterranean diet, with whole grains, fruits, vegetable, fish, lean meats, nuts, and olive oil. Limit salt. -recommend moderate walking, 3-5 times/week for 30-50 minutes each session. Aim for at least 150 minutes.week. Goal should be pace of 3 miles/hours, or walking 1.5 miles in 30 minutes -recommend avoidance of tobacco products. Avoid excess alcohol.  Plan for follow up: 6 mos or sooner PRN.  Medication Adjustments/Labs and Tests Ordered: Current medicines are reviewed at length with the patient today.  Concerns regarding medicines are outlined above.  Orders Placed This Encounter  Procedures  . EKG 12-Lead   No orders of the defined types were placed in this encounter.   Patient Instructions  Medication Instructions:  Your Physician recommend you continue on your current medication as directed.    *If you need a refill on your cardiac medications before your next appointment, please call your pharmacy*   Lab Work: None   Testing/Procedures: None   Follow-Up: At [facility], you and your health needs are our priority.  As part of our continuing mission to provide you with exceptional heart care, we have created designated Provider Care Teams.  These Care Teams include your primary Cardiologist (physician) and Advanced Practice Providers (APPs -  Physician Assistants and Nurse Practitioners) who all work together to provide you with the care you need, when you need it.  We recommend signing up for the patient portal called "MyChart".  Sign up  information is provided on this After Visit Summary.  MyChart is used to connect with patients for Virtual Visits (Telemedicine).  Patients are able to view lab/test results, encounter notes, upcoming appointments, etc.  Non-urgent messages can be sent to your provider as well.   To learn more about what you can do with MyChart, go to [email or web address].    Your next appointment:   6 month(s)  The format for your next appointment:   In Person  Provider:   [name], MD     Signed, [name], MD PhD 04/07/2020  [place]

## 2020-04-10 ENCOUNTER — Other Ambulatory Visit: Payer: Self-pay | Admitting: Physician Assistant

## 2020-04-10 ENCOUNTER — Telehealth: Payer: Self-pay | Admitting: Licensed Clinical Social Worker

## 2020-04-10 DIAGNOSIS — G8929 Other chronic pain: Secondary | ICD-10-CM

## 2020-04-10 DIAGNOSIS — M069 Rheumatoid arthritis, unspecified: Secondary | ICD-10-CM

## 2020-04-10 NOTE — Telephone Encounter (Signed)
Requested medication (s) are due for refill today: Yes  Requested medication (s) are on the active medication list: Yes  Last refill:  02/2020  Future visit scheduled: Yes  Notes to clinic:  Unable to refill per protocol, cannot delegate     Requested Prescriptions  Pending Prescriptions Disp Refills   Oxycodone HCl 10 MG TABS 120 tablet 0    Sig: Take 1 tablet (10 mg total) by mouth every 6 (six) hours as needed. For severe pain      Not Delegated - Analgesics:  Opioid Agonists Failed - 04/10/2020  9:52 AM      Failed - This refill cannot be delegated      Failed - Urine Drug Screen completed in last 360 days.      Passed - Valid encounter within last 6 months    Recent Outpatient Visits           4 months ago Encounter for surveillance of injectable contraceptive   [facility] [place], Dionne Bucy, MD   4 months ago Rheumatoid arthritis involving multiple sites, unspecified whether rheumatoid factor present [facility])   [place], Clearnce Sorrel, PA-C   1 year ago COPD with acute exacerbation [facility])   [place], [place]   1 year ago Acute cystitis without hematuria   [place], Clearnce Sorrel, PA-C   1 year ago Encounter for surveillance of injectable contraceptive   [place], [place], [place]

## 2020-04-10 NOTE — Telephone Encounter (Signed)
CSW left message for return call to assist patient with medication assistance. Message left. Raquel Sarna, [place], [place]

## 2020-04-10 NOTE — Telephone Encounter (Signed)
Medication Refill - Medication: Oxycodone  Has the patient contacted their pharmacy? No. Patient states she normally calls PCP (Agent: If no, request that the patient contact the pharmacy for the refill.) (Agent: If yes, when and what did the pharmacy advise?)  Preferred Pharm[facility] (with phone number [facility] [facility]  Agent: Please be advised that RX refills may take up to 3 business days. We ask that you follow-up with your pharmacy.

## 2020-04-11 NOTE — Telephone Encounter (Signed)
[facility] .  It is on backorder.  She ask that it be sent to [facility]

## 2020-04-13 MED ORDER — OXYCODONE HCL 10 MG PO TABS: 10.0000 mg | ORAL_TABLET | Freq: Four times a day (QID) | ORAL | 0 refills | Status: DC | PRN

## 2020-04-25 ENCOUNTER — Other Ambulatory Visit: Payer: Self-pay

## 2020-04-25 DIAGNOSIS — G8929 Other chronic pain: Secondary | ICD-10-CM

## 2020-04-25 DIAGNOSIS — M069 Rheumatoid arthritis, unspecified: Secondary | ICD-10-CM

## 2020-04-25 NOTE — Telephone Encounter (Signed)
°  Copied from [place] [id number]. Topic: General - Other >> Apr 25, 2020  4:15 PM Leward Quan A wrote: Reason for CRM: Patient called asking to speak to Fenton Malling or her nurse did not elaborate what this was about just that she was exposed to Covid and need her pain med's and just want to speak to Anderson Malta because it was too much to explain. Please call Ph#  [phone].   Patient requesting refill on oxycodone HCl 10 mg. Patient reports she is in quarantine, patient has not a COVID test done. She reports she does not want to be around anyone. Patient reports she has some hoarseness but no other symptoms. Patient reports she will call to schedule an appointment later on.

## 2020-04-26 MED ORDER — OXYCODONE HCL 10 MG PO TABS: 10.0000 mg | ORAL_TABLET | Freq: Four times a day (QID) | ORAL | 0 refills | Status: DC | PRN

## 2020-05-22 DIAGNOSIS — M0579 Rheumatoid arthritis with rheumatoid factor of multiple sites without organ or systems involvement: Secondary | ICD-10-CM | POA: Diagnosis not present

## 2020-05-22 DIAGNOSIS — Z79899 Other long term (current) drug therapy: Secondary | ICD-10-CM | POA: Diagnosis not present

## 2020-06-04 ENCOUNTER — Encounter: Payer: Self-pay | Admitting: Cardiology

## 2020-06-10 ENCOUNTER — Other Ambulatory Visit: Payer: Self-pay | Admitting: Physician Assistant

## 2020-06-10 DIAGNOSIS — I10 Essential (primary) hypertension: Secondary | ICD-10-CM

## 2020-06-10 NOTE — Telephone Encounter (Signed)
Requested Prescriptions  Pending Prescriptions Disp Refills   losartan (COZAAR) 100 MG tablet [Pharmacy Med Name: LOSARTAN POTASSIUM 100 MG TAB] 30 tablet 0    Sig: TAKE 1 TABLET BY MOUTH EVERY DAY     Cardiovascular:  Angiotensin Receptor Blockers Failed - 06/10/2020  8:52 AM      Failed - Cr in normal range and within 180 days    Creat  Date Value Ref Range Status  06/12/2017 0.94 0.50 - 1.10 mg/dL Final   Creatinine, Ser  Date Value Ref Range Status  11/17/2019 0.80 0.57 - 1.00 mg/dL Final         Failed - K in normal range and within 180 days    Potassium  Date Value Ref Range Status  11/17/2019 3.1 (L) 3.5 - 5.2 mmol/L Final  06/22/2014 3.6 3.5 - 5.1 mmol/L Final         Failed - Valid encounter within last 6 months    Recent Outpatient Visits          6 months ago Encounter for surveillance of injectable contraceptive   [facility] [place], Dionne Bucy, MD   6 months ago Rheumatoid arthritis involving multiple sites, unspecified whether rheumatoid factor present [facility])   [place], Clearnce Sorrel, PA-C   1 year ago COPD with acute exacerbation [facility])   [place], [place], [place]   1 year ago Acute cystitis without hematuria   [place], Clearnce Sorrel, [place]   1 year ago Encounter for surveillance of injectable contraceptive   [place], [place], [place] - Patient is not pregnant      Passed - Last BP in normal range    BP Readings from Last 1 Encounters:  04/07/20 130/70

## 2020-06-13 ENCOUNTER — Other Ambulatory Visit: Payer: Self-pay | Admitting: Physician Assistant

## 2020-06-13 DIAGNOSIS — J4551 Severe persistent asthma with (acute) exacerbation: Secondary | ICD-10-CM

## 2020-06-13 DIAGNOSIS — J9621 Acute and chronic respiratory failure with hypoxia: Secondary | ICD-10-CM

## 2020-06-13 NOTE — Telephone Encounter (Signed)
Requested medications are due for refill today?  Yes  Requested medications are on active medication list?  Yes  Last Refill:   12/16/2019  # 1080 mL with one refill.    Future visit scheduled?  No   Notes to Clinic:  Message from pharmacy indicating must not use free text dispense values and  requesting discrete dispense values with re-signing of the order.

## 2020-06-25 IMAGING — DX PORTABLE CHEST - 1 VIEW
1 series · 1 of 1 positions shown · non-contrast
Comparison: CT 06/04/2018. Chest x-ray 02/09/2018, 06/12/2017,
02/22/2017.

CLINICAL DATA: Left-sided chest pain.

EXAM:
PORTABLE CHEST 1 VIEW

[chest ap]
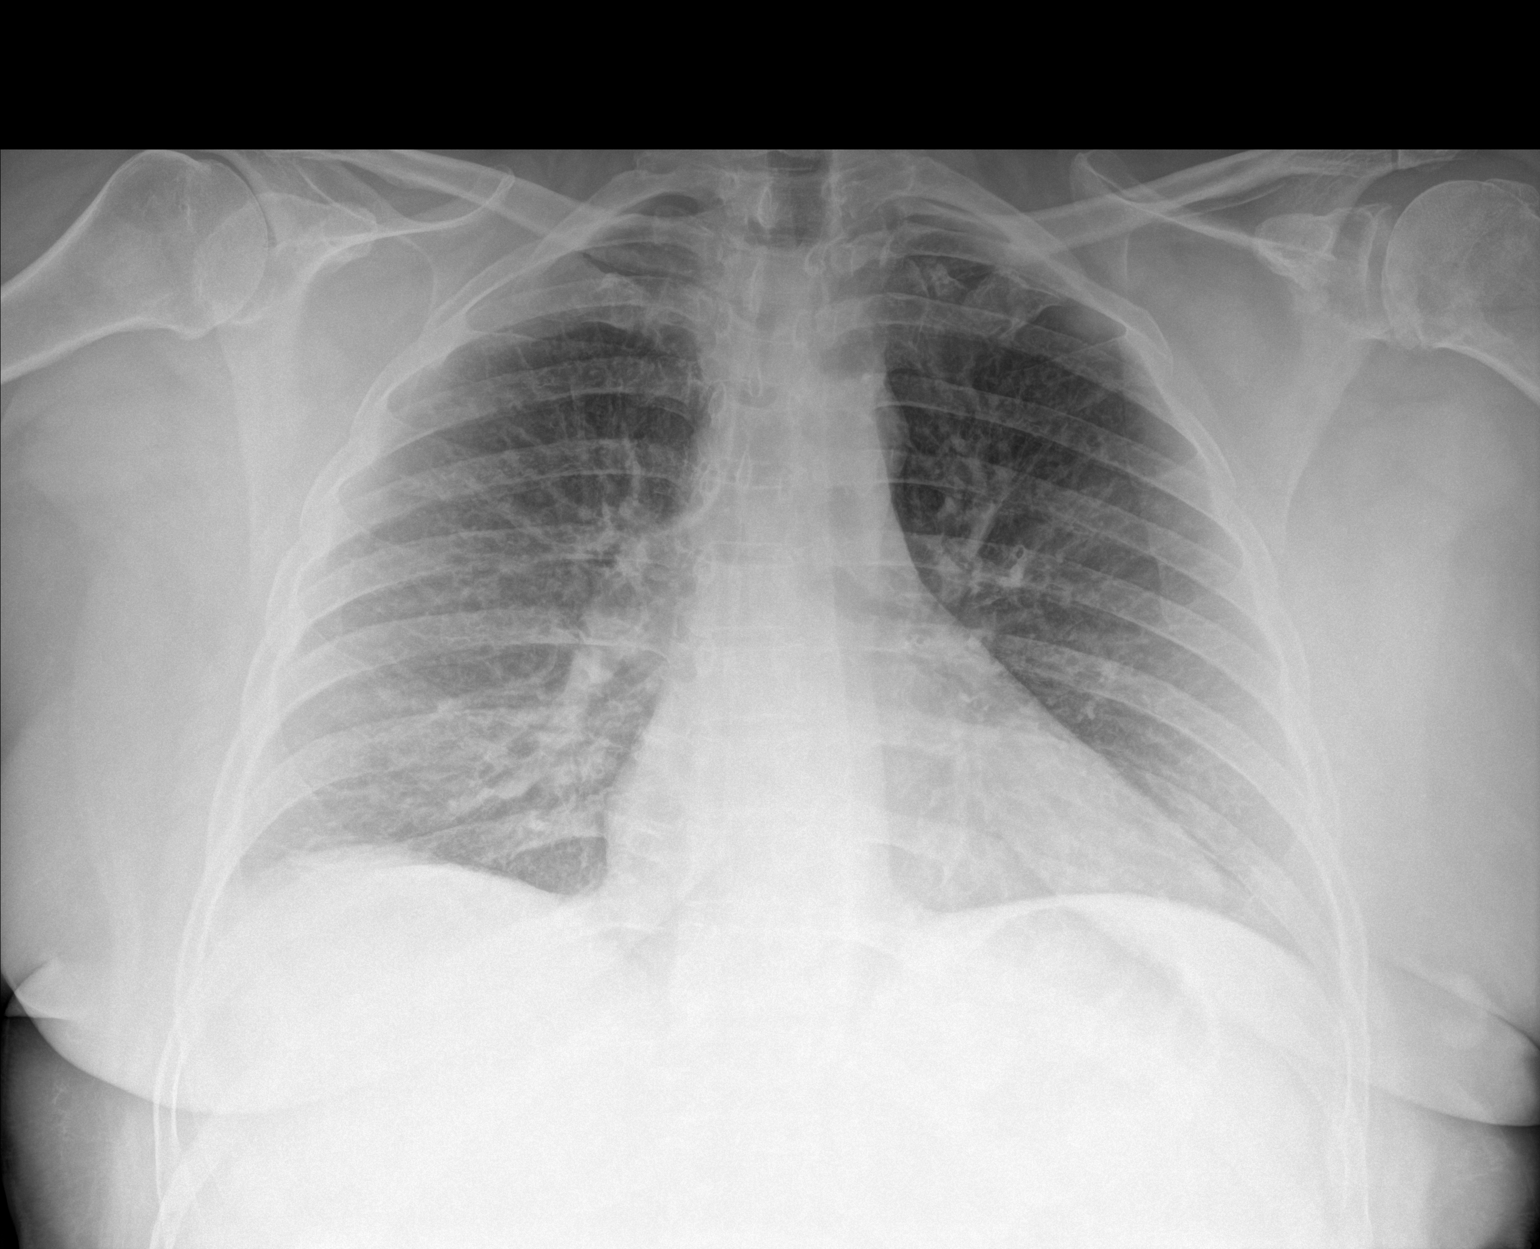

[1 of 1 positions shown; findings below may reference images not displayed]

FINDINGS: Mediastinum and hilar structures normal. Heart size normal.
Questionable right apically density. Standard PA lateral chest x-ray
suggested for further evaluation. Diffuse bilateral pulmonary
interstitial prominence noted. No pleural effusion or pneumothorax.
No acute bony abnormality identified.
IMPRESSION: 1. Questionable right apical density. Standard PA and lateral chest
x-ray suggested for further evaluation.

2. Bilateral interstitial prominence. Although interstitial
prominence may be related to chronic interstitial lung disease,
active interstitial process including pneumonitis cannot be
excluded.

## 2020-06-29 ENCOUNTER — Other Ambulatory Visit: Payer: Self-pay | Admitting: Physician Assistant

## 2020-06-29 DIAGNOSIS — I10 Essential (primary) hypertension: Secondary | ICD-10-CM

## 2020-06-29 NOTE — Telephone Encounter (Signed)
Requested medications are due for refill today yes  Requested medications are on the active medication list yes  Last refill 8/10  Last visit 10/2019  Future visit scheduled 08/2020  Notes to clinic Failed protocol of valid visit within 6 months.

## 2020-07-04 ENCOUNTER — Other Ambulatory Visit: Payer: Self-pay | Admitting: Physician Assistant

## 2020-07-04 DIAGNOSIS — I10 Essential (primary) hypertension: Secondary | ICD-10-CM

## 2020-07-04 NOTE — Telephone Encounter (Signed)
Refill request for Losartan; 30 day courtesy refill given 06/10/20; pharmacy note states pt needs visit; no upcoming visit noted; attempted to contact pt; left message on voicemail for pt to call office.

## 2020-07-15 ENCOUNTER — Emergency Department: Payer: Medicare Other

## 2020-07-15 ENCOUNTER — Observation Stay
Admission: EM | Admit: 2020-07-15 | Discharge: 2020-07-16 | Disposition: A | Discharge status: Home / Self Care | Payer: Medicare Other | Attending: Surgery | Admitting: Surgery

## 2020-07-15 ENCOUNTER — Encounter
Admission: EM | Disposition: A | Discharge status: Home / Self Care | Payer: Self-pay | Source: Home / Self Care | Attending: Emergency Medicine

## 2020-07-15 ENCOUNTER — Emergency Department: Payer: Medicare Other | Admitting: Registered Nurse

## 2020-07-15 ENCOUNTER — Other Ambulatory Visit: Payer: Self-pay

## 2020-07-15 DIAGNOSIS — K8001 Calculus of gallbladder with acute cholecystitis with obstruction: Secondary | ICD-10-CM | POA: Diagnosis not present

## 2020-07-15 DIAGNOSIS — Z79899 Other long term (current) drug therapy: Secondary | ICD-10-CM | POA: Insufficient documentation

## 2020-07-15 DIAGNOSIS — Z9989 Dependence on other enabling machines and devices: Secondary | ICD-10-CM

## 2020-07-15 DIAGNOSIS — K219 Gastro-esophageal reflux disease without esophagitis: Secondary | ICD-10-CM | POA: Diagnosis not present

## 2020-07-15 DIAGNOSIS — G4733 Obstructive sleep apnea (adult) (pediatric): Secondary | ICD-10-CM

## 2020-07-15 DIAGNOSIS — R1011 Right upper quadrant pain: Secondary | ICD-10-CM | POA: Diagnosis not present

## 2020-07-15 DIAGNOSIS — Z7982 Long term (current) use of aspirin: Secondary | ICD-10-CM | POA: Diagnosis not present

## 2020-07-15 DIAGNOSIS — K8000 Calculus of gallbladder with acute cholecystitis without obstruction: Secondary | ICD-10-CM | POA: Diagnosis not present

## 2020-07-15 DIAGNOSIS — K819 Cholecystitis, unspecified: Secondary | ICD-10-CM | POA: Diagnosis present

## 2020-07-15 DIAGNOSIS — I10 Essential (primary) hypertension: Secondary | ICD-10-CM | POA: Diagnosis present

## 2020-07-15 DIAGNOSIS — I213 ST elevation (STEMI) myocardial infarction of unspecified site: Secondary | ICD-10-CM | POA: Diagnosis not present

## 2020-07-15 DIAGNOSIS — K810 Acute cholecystitis: Secondary | ICD-10-CM

## 2020-07-15 DIAGNOSIS — E66813 Obesity, class 3: Secondary | ICD-10-CM | POA: Diagnosis present

## 2020-07-15 DIAGNOSIS — Z20822 Contact with and (suspected) exposure to covid-19: Secondary | ICD-10-CM | POA: Diagnosis not present

## 2020-07-15 DIAGNOSIS — K8020 Calculus of gallbladder without cholecystitis without obstruction: Secondary | ICD-10-CM | POA: Diagnosis not present

## 2020-07-15 DIAGNOSIS — R9431 Abnormal electrocardiogram [ECG] [EKG]: Secondary | ICD-10-CM | POA: Diagnosis not present

## 2020-07-15 DIAGNOSIS — K8012 Calculus of gallbladder with acute and chronic cholecystitis without obstruction: Secondary | ICD-10-CM | POA: Diagnosis not present

## 2020-07-15 DIAGNOSIS — I2510 Atherosclerotic heart disease of native coronary artery without angina pectoris: Secondary | ICD-10-CM | POA: Insufficient documentation

## 2020-07-15 DIAGNOSIS — M069 Rheumatoid arthritis, unspecified: Secondary | ICD-10-CM | POA: Diagnosis present

## 2020-07-15 DIAGNOSIS — Z87891 Personal history of nicotine dependence: Secondary | ICD-10-CM | POA: Diagnosis not present

## 2020-07-15 DIAGNOSIS — M05711 Rheumatoid arthritis with rheumatoid factor of right shoulder without organ or systems involvement: Secondary | ICD-10-CM

## 2020-07-15 LAB — COMPREHENSIVE METABOLIC PANEL
ALT: 21 U/L (ref 0–44)
AST: 20 U/L (ref 15–41)
Albumin: 3.8 g/dL (ref 3.5–5.0)
Alkaline Phosphatase: 108 U/L (ref 38–126)
Anion gap: 13 (ref 5–15)
BUN: 6 mg/dL (ref 6–20)
CO2: 25 mmol/L (ref 22–32)
Calcium: 8.6 mg/dL — ABNORMAL LOW (ref 8.9–10.3)
Chloride: 99 mmol/L (ref 98–111)
Creatinine, Ser: 0.65 mg/dL (ref 0.44–1.00)
GFR, Estimated: >60 mL/min (ref >60)
Glucose, Bld: 112 mg/dL — ABNORMAL HIGH (ref 70–99)
Potassium: 3 mmol/L — ABNORMAL LOW (ref 3.5–5.1)
Sodium: 137 mmol/L (ref 135–145)
Total Bilirubin: 0.5 mg/dL (ref 0.3–1.2)
Total Protein: 7.6 g/dL (ref 6.5–8.1)

## 2020-07-15 LAB — CBC
HCT: 40.2 % (ref 36.0–46.0)
Hemoglobin: 13.2 g/dL (ref 12.0–15.0)
MCH: 25 pg — ABNORMAL LOW (ref 26.0–34.0)
MCHC: 32.8 g/dL (ref 30.0–36.0)
MCV: 76.3 fL — ABNORMAL LOW (ref 80.0–100.0)
Platelets: 224 10*3/uL (ref 150–400)
RBC: 5.27 MIL/uL — ABNORMAL HIGH (ref 3.87–5.11)
RDW: 17.2 % — ABNORMAL HIGH (ref 11.5–15.5)
WBC: 14 10*3/uL — ABNORMAL HIGH (ref 4.0–10.5)
nRBC: 0 % (ref 0.0–0.2)

## 2020-07-15 LAB — URINALYSIS, COMPLETE (UACMP) WITH MICROSCOPIC
Bilirubin Urine: NEGATIVE
Glucose, UA: NEGATIVE mg/dL
Hgb urine dipstick: NEGATIVE
Ketones, ur: NEGATIVE mg/dL
Nitrite: NEGATIVE
Protein, ur: NEGATIVE mg/dL
Specific Gravity, Urine: 1.004 — ABNORMAL LOW (ref 1.005–1.030)
pH: 7 (ref 5.0–8.0)

## 2020-07-15 LAB — LIPASE, BLOOD: Lipase: 23 U/L (ref 11–51)

## 2020-07-15 LAB — TROPONIN I (HIGH SENSITIVITY)
Troponin I (High Sensitivity): 7 ng/L (ref <18)
Troponin I (High Sensitivity): 8 ng/L (ref <18)

## 2020-07-15 LAB — POC URINE PREG, ED: Preg Test, Ur: NEGATIVE

## 2020-07-15 LAB — RESP PANEL BY RT-PCR (FLU A&B, COVID) ARPGX2
Influenza A by PCR: NEGATIVE
Influenza B by PCR: NEGATIVE
SARS Coronavirus 2 by RT PCR: NEGATIVE

## 2020-07-15 SURGERY — CHOLECYSTECTOMY, ROBOT-ASSISTED, LAPAROSCOPIC
Anesthesia: General | Site: Abdomen

## 2020-07-15 MED ORDER — LIDOCAINE HCL (CARDIAC) PF 100 MG/5ML IV SOSY
PREFILLED_SYRINGE | INTRAVENOUS | Status: DC | PRN
  Administered 2020-07-15: 100 mg via INTRAVENOUS

## 2020-07-15 MED ORDER — PROPOFOL 10 MG/ML IV BOLUS
INTRAVENOUS | Status: AC
  Filled 2020-07-15: qty 20

## 2020-07-15 MED ORDER — HYDROCHLOROTHIAZIDE 25 MG PO TABS
25.0000 mg | ORAL_TABLET | Freq: Every day | ORAL | Status: DC
  Filled 2020-07-15: qty 1

## 2020-07-15 MED ORDER — ATORVASTATIN CALCIUM 20 MG PO TABS
40.0000 mg | ORAL_TABLET | Freq: Every evening | ORAL | Status: DC
  Administered 2020-07-15: 40 mg via ORAL
  Filled 2020-07-15: qty 2

## 2020-07-15 MED ORDER — SEVOFLURANE IN SOLN
RESPIRATORY_TRACT | Status: AC
  Filled 2020-07-15: qty 250

## 2020-07-15 MED ORDER — KETOROLAC TROMETHAMINE 30 MG/ML IJ SOLN
15.0000 mg | Freq: Once | INTRAMUSCULAR | Status: AC
  Administered 2020-07-15: 15 mg via INTRAVENOUS
  Filled 2020-07-15: qty 1

## 2020-07-15 MED ORDER — MIDAZOLAM HCL 2 MG/2ML IJ SOLN
INTRAMUSCULAR | Status: DC | PRN
  Administered 2020-07-15: 2 mg via INTRAVENOUS

## 2020-07-15 MED ORDER — FENTANYL CITRATE (PF) 100 MCG/2ML IJ SOLN: 25.0000 ug | INTRAMUSCULAR | Status: DC | PRN

## 2020-07-15 MED ORDER — MONTELUKAST SODIUM 10 MG PO TABS: 10.0000 mg | ORAL_TABLET | Freq: Every day | ORAL | Status: DC | PRN

## 2020-07-15 MED ORDER — ALPRAZOLAM 0.5 MG PO TABS: 1.0000 mg | ORAL_TABLET | Freq: Four times a day (QID) | ORAL | Status: DC | PRN

## 2020-07-15 MED ORDER — ALBUTEROL SULFATE HFA 108 (90 BASE) MCG/ACT IN AERS
1.0000 | INHALATION_SPRAY | Freq: Four times a day (QID) | RESPIRATORY_TRACT | Status: DC | PRN
  Filled 2020-07-15: qty 6.7

## 2020-07-15 MED ORDER — LIDOCAINE HCL (PF) 2 % IJ SOLN
INTRAMUSCULAR | Status: AC
  Filled 2020-07-15: qty 5

## 2020-07-15 MED ORDER — LACTATED RINGERS IV SOLN: INTRAVENOUS | Status: DC | PRN

## 2020-07-15 MED ORDER — ALPRAZOLAM 0.5 MG PO TABS: 1.0000 mg | ORAL_TABLET | Freq: Three times a day (TID) | ORAL | Status: DC | PRN

## 2020-07-15 MED ORDER — SUGAMMADEX SODIUM 200 MG/2ML IV SOLN
INTRAVENOUS | Status: DC | PRN
Start: 2020-07-15 — End: 2020-07-15
  Administered 2020-07-15: 187.8 mg via INTRAVENOUS

## 2020-07-15 MED ORDER — BUPIVACAINE-EPINEPHRINE (PF) 0.25% -1:200000 IJ SOLN
INTRAMUSCULAR | Status: DC | PRN
  Administered 2020-07-15: 5 mL
  Administered 2020-07-15: 20 mL

## 2020-07-15 MED ORDER — MORPHINE SULFATE (PF) 4 MG/ML IV SOLN
4.0000 mg | Freq: Once | INTRAVENOUS | Status: AC
  Administered 2020-07-15: 4 mg via INTRAVENOUS
  Filled 2020-07-15: qty 1

## 2020-07-15 MED ORDER — ONDANSETRON 4 MG PO TBDP: 4.0000 mg | ORAL_TABLET | Freq: Four times a day (QID) | ORAL | Status: DC | PRN

## 2020-07-15 MED ORDER — FLUTICASONE PROPIONATE 50 MCG/ACT NA SUSP
2.0000 | Freq: Every day | NASAL | Status: DC
  Filled 2020-07-15: qty 16

## 2020-07-15 MED ORDER — NITROGLYCERIN 0.4 MG SL SUBL
SUBLINGUAL_TABLET | SUBLINGUAL | Status: DC | PRN
  Administered 2020-07-15 (×3): .4 mg via SUBLINGUAL

## 2020-07-15 MED ORDER — NITROGLYCERIN 2 % TD OINT
TOPICAL_OINTMENT | TRANSDERMAL | Status: DC | PRN
  Administered 2020-07-15: 1 [in_us] via TOPICAL

## 2020-07-15 MED ORDER — ONDANSETRON HCL 4 MG/2ML IJ SOLN: 4.0000 mg | Freq: Once | INTRAMUSCULAR | Status: DC | PRN

## 2020-07-15 MED ORDER — LORATADINE 10 MG PO TABS: 10.0000 mg | ORAL_TABLET | Freq: Every day | ORAL | Status: DC | PRN

## 2020-07-15 MED ORDER — AMLODIPINE BESYLATE 5 MG PO TABS
5.0000 mg | ORAL_TABLET | Freq: Every day | ORAL | Status: DC
  Administered 2020-07-15: 5 mg via ORAL
  Filled 2020-07-15 (×2): qty 1

## 2020-07-15 MED ORDER — POTASSIUM CHLORIDE CRYS ER 20 MEQ PO TBCR
40.0000 meq | EXTENDED_RELEASE_TABLET | Freq: Two times a day (BID) | ORAL | Status: AC
  Administered 2020-07-15 – 2020-07-16 (×2): 40 meq via ORAL
  Filled 2020-07-15 (×2): qty 2

## 2020-07-15 MED ORDER — MIDAZOLAM HCL 2 MG/2ML IJ SOLN
INTRAMUSCULAR | Status: AC
  Filled 2020-07-15: qty 2

## 2020-07-15 MED ORDER — BUPIVACAINE LIPOSOME 1.3 % IJ SUSP
INTRAMUSCULAR | Status: DC | PRN
  Administered 2020-07-15: 20 mL
  Administered 2020-07-15: 5 mL

## 2020-07-15 MED ORDER — IPRATROPIUM-ALBUTEROL 0.5-2.5 (3) MG/3ML IN SOLN: 3.0000 mL | Freq: Four times a day (QID) | RESPIRATORY_TRACT | Status: DC | PRN

## 2020-07-15 MED ORDER — FENTANYL CITRATE (PF) 100 MCG/2ML IJ SOLN
INTRAMUSCULAR | Status: DC | PRN
  Administered 2020-07-15 (×2): 50 ug via INTRAVENOUS

## 2020-07-15 MED ORDER — IBUPROFEN 800 MG PO TABS: 800.0000 mg | ORAL_TABLET | Freq: Three times a day (TID) | ORAL | 0 refills | Status: DC | PRN

## 2020-07-15 MED ORDER — MORPHINE SULFATE (PF) 4 MG/ML IV SOLN
4.0000 mg | INTRAVENOUS | Status: DC | PRN
  Administered 2020-07-15 – 2020-07-16 (×2): 4 mg via INTRAVENOUS
  Filled 2020-07-15 (×2): qty 1

## 2020-07-15 MED ORDER — ASPIRIN EC 81 MG PO TBEC
81.0000 mg | DELAYED_RELEASE_TABLET | Freq: Every day | ORAL | Status: DC
  Filled 2020-07-15: qty 1

## 2020-07-15 MED ORDER — ETOMIDATE 2 MG/ML IV SOLN
INTRAVENOUS | Status: AC
  Filled 2020-07-15: qty 10

## 2020-07-15 MED ORDER — HEPARIN SODIUM (PORCINE) 5000 UNIT/ML IJ SOLN
5000.0000 [IU] | Freq: Three times a day (TID) | INTRAMUSCULAR | Status: DC
  Administered 2020-07-16: 5000 [IU] via SUBCUTANEOUS
  Filled 2020-07-15: qty 1

## 2020-07-15 MED ORDER — DEXAMETHASONE SODIUM PHOSPHATE 10 MG/ML IJ SOLN
INTRAMUSCULAR | Status: DC | PRN
  Administered 2020-07-15: 10 mg via INTRAVENOUS

## 2020-07-15 MED ORDER — HYDROCODONE-ACETAMINOPHEN 5-325 MG PO TABS: 2.0000 | ORAL_TABLET | Freq: Four times a day (QID) | ORAL | 0 refills | Status: DC | PRN

## 2020-07-15 MED ORDER — CEFAZOLIN SODIUM-DEXTROSE 2-4 GM/100ML-% IV SOLN
2.0000 g | Freq: Three times a day (TID) | INTRAVENOUS | Status: AC
  Administered 2020-07-15 – 2020-07-16 (×2): 2 g via INTRAVENOUS
  Filled 2020-07-15 (×2): qty 100

## 2020-07-15 MED ORDER — INDOCYANINE GREEN 25 MG IV SOLR
5.0000 mg | Freq: Once | INTRAVENOUS | Status: AC
  Administered 2020-07-15: 5 mg via INTRAVENOUS
  Filled 2020-07-15: qty 2

## 2020-07-15 MED ORDER — ZOLPIDEM TARTRATE 5 MG PO TABS
5.0000 mg | ORAL_TABLET | Freq: Every day | ORAL | Status: DC
  Administered 2020-07-15: 5 mg via ORAL
  Filled 2020-07-15: qty 1

## 2020-07-15 MED ORDER — AMPHETAMINE-DEXTROAMPHET ER 30 MG PO CP24
30.0000 mg | ORAL_CAPSULE | Freq: Every day | ORAL | Status: DC
  Administered 2020-07-16: 30 mg via ORAL
  Filled 2020-07-15: qty 1

## 2020-07-15 MED ORDER — ETOMIDATE 2 MG/ML IV SOLN
INTRAVENOUS | Status: DC | PRN
  Administered 2020-07-15: 18 mg via INTRAVENOUS

## 2020-07-15 MED ORDER — ACETAMINOPHEN 10 MG/ML IV SOLN
INTRAVENOUS | Status: AC
  Filled 2020-07-15: qty 100

## 2020-07-15 MED ORDER — PREDNISONE 5 MG PO TABS
5.0000 mg | ORAL_TABLET | Freq: Every day | ORAL | Status: DC
  Filled 2020-07-15 (×2): qty 1

## 2020-07-15 MED ORDER — ONDANSETRON HCL 4 MG/2ML IJ SOLN
INTRAMUSCULAR | Status: DC | PRN
  Administered 2020-07-15: 4 mg via INTRAVENOUS

## 2020-07-15 MED ORDER — POTASSIUM CITRATE ER 10 MEQ (1080 MG) PO TBCR: 10.0000 meq | EXTENDED_RELEASE_TABLET | Freq: Every day | ORAL | Status: DC

## 2020-07-15 MED ORDER — NITROGLYCERIN 2 % TD OINT
1.0000 [in_us] | TOPICAL_OINTMENT | Freq: Once | TRANSDERMAL | Status: DC
  Filled 2020-07-15: qty 1

## 2020-07-15 MED ORDER — CEFAZOLIN SODIUM-DEXTROSE 2-4 GM/100ML-% IV SOLN
2.0000 g | Freq: Three times a day (TID) | INTRAVENOUS | Status: DC
  Administered 2020-07-15: 2 g via INTRAVENOUS
  Filled 2020-07-15 (×5): qty 100

## 2020-07-15 MED ORDER — OXYCODONE HCL 10 MG PO TABS: 10.0000 mg | ORAL_TABLET | Freq: Four times a day (QID) | ORAL | Status: DC | PRN

## 2020-07-15 MED ORDER — LOSARTAN POTASSIUM 50 MG PO TABS
100.0000 mg | ORAL_TABLET | Freq: Every day | ORAL | Status: DC
  Filled 2020-07-15: qty 2

## 2020-07-15 MED ORDER — DEXAMETHASONE SODIUM PHOSPHATE 10 MG/ML IJ SOLN
INTRAMUSCULAR | Status: AC
  Filled 2020-07-15: qty 1

## 2020-07-15 MED ORDER — LEFLUNOMIDE 20 MG PO TABS
20.0000 mg | ORAL_TABLET | Freq: Every day | ORAL | Status: DC
  Filled 2020-07-15 (×2): qty 1

## 2020-07-15 MED ORDER — PANTOPRAZOLE SODIUM 40 MG PO TBEC
40.0000 mg | DELAYED_RELEASE_TABLET | Freq: Every day | ORAL | Status: DC
  Administered 2020-07-15: 40 mg via ORAL
  Filled 2020-07-15 (×2): qty 1

## 2020-07-15 MED ORDER — ONDANSETRON HCL 4 MG/2ML IJ SOLN
INTRAMUSCULAR | Status: AC
  Filled 2020-07-15: qty 2

## 2020-07-15 MED ORDER — ROCURONIUM BROMIDE 100 MG/10ML IV SOLN
INTRAVENOUS | Status: DC | PRN
  Administered 2020-07-15: 20 mg via INTRAVENOUS
  Administered 2020-07-15: 50 mg via INTRAVENOUS

## 2020-07-15 MED ORDER — FENTANYL CITRATE (PF) 100 MCG/2ML IJ SOLN
INTRAMUSCULAR | Status: AC
  Filled 2020-07-15: qty 2

## 2020-07-15 MED ORDER — FLUTICASONE FUROATE-VILANTEROL 200-25 MCG/INH IN AEPB
1.0000 | INHALATION_SPRAY | Freq: Every day | RESPIRATORY_TRACT | Status: DC | PRN
  Filled 2020-07-15: qty 28

## 2020-07-15 MED ORDER — ONDANSETRON HCL 4 MG/2ML IJ SOLN: 4.0000 mg | Freq: Four times a day (QID) | INTRAMUSCULAR | Status: DC | PRN

## 2020-07-15 MED ORDER — DULOXETINE HCL 30 MG PO CPEP
120.0000 mg | ORAL_CAPSULE | Freq: Every day | ORAL | Status: DC
  Filled 2020-07-15: qty 4

## 2020-07-15 SURGICAL SUPPLY — 47 items
CANISTER SUCT 1200ML W/VALVE (MISCELLANEOUS) IMPLANT
CHLORAPREP W/TINT 26 (MISCELLANEOUS) ×3 IMPLANT
CLIP VESOLOCK MED LG 6/CT (CLIP) ×3 IMPLANT
COVER TIP SHEARS 8 DVNC (MISCELLANEOUS) ×1 IMPLANT
COVER TIP SHEARS 8MM DA VINCI (MISCELLANEOUS) ×2
COVER WAND RF STERILE (DRAPES) ×3 IMPLANT
DECANTER SPIKE VIAL GLASS SM (MISCELLANEOUS) IMPLANT
DEFOGGER SCOPE WARMER CLEARIFY (MISCELLANEOUS) ×3 IMPLANT
DERMABOND ADVANCED (GAUZE/BANDAGES/DRESSINGS) ×2
DERMABOND ADVANCED .7 DNX12 (GAUZE/BANDAGES/DRESSINGS) ×1 IMPLANT
DRAPE ARM DVNC X/XI (DISPOSABLE) ×4 IMPLANT
DRAPE COLUMN DVNC XI (DISPOSABLE) ×1 IMPLANT
DRAPE DA VINCI XI ARM (DISPOSABLE) ×8
DRAPE DA VINCI XI COLUMN (DISPOSABLE) ×2
ELECT CAUTERY BLADE 6.4 (BLADE) ×3 IMPLANT
GLOVE ORTHO TXT STRL SZ7.5 (GLOVE) ×6 IMPLANT
GOWN STRL REUS W/ TWL LRG LVL3 (GOWN DISPOSABLE) ×4 IMPLANT
GOWN STRL REUS W/TWL LRG LVL3 (GOWN DISPOSABLE) ×8
GRASPER SUT TROCAR 14GX15 (MISCELLANEOUS) IMPLANT
INFUSOR MANOMETER BAG 3000ML (MISCELLANEOUS) IMPLANT
IRRIGATION STRYKERFLOW (MISCELLANEOUS) ×1 IMPLANT
IRRIGATOR STRYKERFLOW (MISCELLANEOUS) ×3
IRRIGATOR SUCT 8 DISP DVNC XI (IRRIGATION / IRRIGATOR) IMPLANT
IRRIGATOR SUCTION 8MM XI DISP (IRRIGATION / IRRIGATOR)
IV NS 1000ML (IV SOLUTION) ×2
IV NS 1000ML BAXH (IV SOLUTION) ×1 IMPLANT
IV NS IRRIG 3000ML ARTHROMATIC (IV SOLUTION) ×3 IMPLANT
KIT PINK PAD W/HEAD ARE REST (MISCELLANEOUS) ×3
KIT PINK PAD W/HEAD ARM REST (MISCELLANEOUS) ×1 IMPLANT
KIT TURNOVER KIT A (KITS) ×3 IMPLANT
LABEL OR SOLS (LABEL) ×3 IMPLANT
MANIFOLD NEPTUNE II (INSTRUMENTS) ×3 IMPLANT
NEEDLE HYPO 22GX1.5 SAFETY (NEEDLE) ×3 IMPLANT
NEEDLE INSUFFLATION 14GA 120MM (NEEDLE) IMPLANT
NS IRRIG 500ML POUR BTL (IV SOLUTION) IMPLANT
PACK LAP CHOLECYSTECTOMY (MISCELLANEOUS) ×3 IMPLANT
PENCIL ELECTRO HAND CTR (MISCELLANEOUS) ×3 IMPLANT
POUCH SPECIMEN RETRIEVAL 10MM (ENDOMECHANICALS) ×3 IMPLANT
SEAL CANN UNIV 5-8 DVNC XI (MISCELLANEOUS) ×4 IMPLANT
SEAL XI 5MM-8MM UNIVERSAL (MISCELLANEOUS) ×8
SET TUBE SMOKE EVAC HIGH FLOW (TUBING) ×3 IMPLANT
SOLUTION ELECTROLUBE (MISCELLANEOUS) ×3 IMPLANT
SUT MNCRL 4-0 (SUTURE) ×2
SUT MNCRL 4-0 27XMFL (SUTURE) ×1
SUT VICRYL 0 AB UR-6 (SUTURE) ×3 IMPLANT
SUTURE MNCRL 4-0 27XMF (SUTURE) ×1 IMPLANT
TROCAR Z-THREAD FIOS 11X100 BL (TROCAR) ×3 IMPLANT

## 2020-07-15 NOTE — ED Notes (Signed)
PLEASE CALL PT FAMILY ONCE OUT OF SURGERY  PT SISTER RENA [phone] PT NIECE [phone]

## 2020-07-15 NOTE — Anesthesia Procedure Notes (Signed)
Procedure Name: Intubation Date/Time: 07/15/2020 3:50 PM Performed by: Doreen Salvage, CRNA Pre-anesthesia Checklist: Patient identified, Patient being monitored, Timeout performed, Emergency Drugs available and Suction available Patient Re-evaluated:Patient Re-evaluated prior to induction Oxygen Delivery Method: Circle system utilized Preoxygenation: Pre-oxygenation with 100% oxygen Induction Type: IV induction Ventilation: Mask ventilation without difficulty Laryngoscope Size: Mac, 3 and McGraph Grade View: Grade I Tube type: Oral Tube size: 7.0 mm Number of attempts: 1 Airway Equipment and Method: Stylet Placement Confirmation: ETT inserted through vocal cords under direct vision,  positive ETCO2 and breath sounds checked- equal and bilateral Secured at: 21 cm Tube secured with: Tape Dental Injury: Teeth and Oropharynx as per pre-operative assessment

## 2020-07-15 NOTE — Anesthesia Postprocedure Evaluation (Signed)
Anesthesia Post Note  Patient: Susan Gilmore  Procedure(s) Performed: XI ROBOTIC ASSISTED LAPAROSCOPIC CHOLECYSTECTOMY (N/A Abdomen)  Patient location during evaluation: PACU Anesthesia Type: General Level of consciousness: awake and alert Pain management: pain level controlled Vital Signs Assessment: post-procedure vital signs reviewed and stable Respiratory status: spontaneous breathing and respiratory function stable Cardiovascular status: stable Anesthetic complications: no Comments: Pt with ST elevation in OR. Tx'd with nitroglycerin. Slow resolution of elevation. Back to baseline in recovery. Troponin negative intra-op. Hospitalist consulted. Will keep pt on monitor overnight.    No complications documented.   Last Vitals:  Vitals:   07/15/20 1842 07/15/20 1850  BP:  128/70  Pulse: 82 85  Resp: 12 14  Temp:  36.6 C  SpO2: 94% 97%    Last Pain:  Vitals:   07/15/20 1842  TempSrc:   PainSc: 0-No pain                 [name]

## 2020-07-15 NOTE — Transfer of Care (Signed)
Immediate Anesthesia Transfer of Care Note  Patient: Susan Gilmore  Procedure(s) Performed: Procedure(s): XI ROBOTIC ASSISTED LAPAROSCOPIC CHOLECYSTECTOMY (N/A)  Patient Location: PACU  Anesthesia Type:General  Level of Consciousness: sedated  Airway & Oxygen Therapy: Patient Spontanous Breathing and Patient connected to face mask oxygen  Post-op Assessment: Report given to RN and Post -op Vital signs reviewed and stable  Post vital signs: Reviewed and stable  Last Vitals:  Vitals:   07/15/20 1424 07/15/20 1752  BP: (!) 164/80 127/73  Pulse: 84 78  Resp: 18 15  Temp: 36.8 C (!) 36.2 C  SpO2: 24% 46%    Complications: No apparent anesthesia complications

## 2020-07-15 NOTE — H&P (Signed)
Patient ID: Susan Gilmore, female   DOB: 04/13/1966, 54 y.o.   MRN: [id number]  Chief Complaint: Abdominal pain  History of Present Illness Susan Gilmore is a 54 y.o. female with 3-day history of abdominal pain, seemingly precipitated by a meal at [place].  She reports nausea, denies vomiting.  Reports constipation.  Also reports severe anorexia over the last 3 days. She reports the pain is been mainly in the right upper quadrant and radiating along the right rib cage.  She denies fevers and chills.  No prior history of similar pain.  No known history of cholelithiasis.  Pain has persisted and is unrelenting.  Past Medical History Past Medical History:  Diagnosis Date  . Abnormal Pap smear 04/28/2012   normal pap /positive hpv   . Abnormal stress test   . Allergic rhinitis   . Bipolar disorder ([place])   . Carpal tunnel syndrome   . Chest pain   . Chronic anxiety   . Contraception   . Cough   . HPV (human papilloma virus) anogenital infection   . HTN (hypertension)   . Hyperplastic colon polyp   . Insomnia   . Palpitations   . Pneumonia       Past Surgical History:  Procedure Laterality Date  . full mouth dental    . GANGLION CYST EXCISION    . WISDOM TOOTH EXTRACTION      Allergies  Allergen Reactions  . Effexor [Venlafaxine Hcl] Swelling  . Isosorbide Nitrate Other (See Comments)    Reaction: Bad headache    No current facility-administered medications for this encounter.   Current Outpatient Medications  Medication Sig Dispense Refill  . albuterol (PROAIR HFA) 108 (90 Base) MCG/ACT inhaler Inhale 1-2 puffs into the lungs every 6 (six) hours as needed for wheezing or shortness of breath. 18 g 11  . ALPRAZolam (XANAX) 1 MG tablet Take 1 mg by mouth 4 (four) times daily as needed for anxiety.     [name] amLODipine (NORVASC) 5 MG tablet TAKE 1 TABLET BY MOUTH EVERY DAY 90 tablet 1  . amphetamine-dextroamphetamine (ADDERALL XR) 30 MG 24 hr capsule TAKE 2 CAPSULES BY MOUTH IN  THE MORNING    . aspirin EC 81 MG tablet Take 1 tablet (81 mg total) by mouth daily. 90 tablet 3  . atorvastatin (LIPITOR) 40 MG tablet TAKE 1 TABLET BY MOUTH EVERY DAY 90 tablet 3  . DULoxetine (CYMBALTA) 60 MG capsule Take 120 mg by mouth daily.     . fluticasone (FLONASE) 50 MCG/ACT nasal spray Place 2 sprays into both nostrils daily. (Patient taking differently: Place 2 sprays into both nostrils 2 (two) times daily. ) 16 g 11  . Fluticasone-Salmeterol (ADVAIR) 250-50 MCG/DOSE AEPB Inhale into the lungs.    . hydrochlorothiazide (HYDRODIURIL) 25 MG tablet TAKE 1 TABLET BY MOUTH EVERY DAY 90 tablet 0  . ipratropium-albuterol (DUONEB) 0.5-2.5 (3) MG/3ML SOLN TAKE 3 MLS BY NEBULIZATION EVERY 6 (SIX) HOURS AS NEEDED (SHORTNESS OF BREATH). 1080 mL 1  . leflunomide (ARAVA) 20 MG tablet Take 20 mg by mouth daily.    [name] loratadine (CLARITIN) 10 MG tablet Take 1 tablet by mouth daily as needed for allergies.     [name] losartan (COZAAR) 100 MG tablet TAKE 1 TABLET BY MOUTH EVERY DAY 30 tablet 0  . montelukast (SINGULAIR) 10 MG tablet Take 1 tablet by mouth daily as needed.     [name] omeprazole (PRILOSEC) 20 MG capsule Take 1 capsule (20 mg total)  by mouth daily. (Patient taking differently: Take 20 mg by mouth daily. As needed) 30 capsule 11  . Oxycodone HCl 10 MG TABS Take 1 tablet (10 mg total) by mouth every 6 (six) hours as needed. For severe pain 120 tablet 0  . potassium citrate (UROCIT-K) 10 MEQ (1080 MG) SR tablet Take 1 tablet (10 mEq total) by mouth daily. 90 tablet 1  . predniSONE (DELTASONE) 5 MG tablet Take by mouth.    . zolpidem (AMBIEN) 10 MG tablet Take 10 mg by mouth at bedtime.   0    Family History Family History  Problem Relation Age of Onset  . Stroke Paternal Grandmother   . Heart disease Paternal Grandmother   . Diabetes Maternal Grandmother   . Heart disease Maternal Grandmother   . Hypertension Maternal Grandmother   . Heart disease Maternal Grandfather   . Hypertension Maternal  Grandfather   . Heart disease Father   . Stroke Father   . Hypertension Father   . Hyperlipidemia Father   . Diabetes Mother   . Heart attack Mother       Social History Social History   Tobacco Use  . Smoking status: Former Smoker    Packs/day: 1.00    Years: 26.00    Pack years: 26.00    Types: Cigarettes    Quit date: 07/26/2013    Years since quitting: 6.9  . Smokeless tobacco: Never Used  Vaping Use  . Vaping Use: Some days  Substance Use Topics  . Alcohol use: No    Alcohol/week: 0.0 standard drinks    Comment: Had a history of alcohol use but currently is not drinking any  . Drug use: No        Review of Systems  Constitutional: Negative for chills, fever and weight loss.  HENT: Negative.   Eyes: Negative for redness.  Respiratory: Negative for shortness of breath.   Cardiovascular: Negative for chest pain.  Gastrointestinal: Positive for abdominal pain, constipation and nausea. Negative for blood in stool, diarrhea, melena and vomiting.  Genitourinary: Negative for dysuria and urgency.  Skin: Negative.   Neurological: Negative for headaches.  Psychiatric/Behavioral: Negative.       Physical Exam Blood pressure (!) 162/84, pulse 86, temperature 98 F (36.7 C), temperature source Oral, resp. rate 20, height 5' (1.524 m), weight 93.9 kg, SpO2 98 %. Last Weight  Most recent update: 07/15/2020  3:49 AM   Weight  93.9 kg (207 lb)            CONSTITUTIONAL: Morbidly obese, and well nourished, appropriately responsive and aware without distress.  I witnessed ambulation to the restroom, consistent with her persistent right upper quadrant pain. EYES: Sclera non-icteric.   EARS, NOSE, MOUTH AND THROAT: Mask worn.   Hearing is intact to voice.  NECK: Trachea is midline.  LYMPH NODES:  Lymph nodes in the neck are not appreciable. RESPIRATORY:  Lungs are clear, and breath sounds are equal bilaterally. Normal respiratory effort without pathologic use of accessory  muscles. CARDIOVASCULAR: Heart is regular in rate and rhythm. GI: The abdomen is tender in the right upper quadrant with guarding, positive Murphy sign.  Otherwise her abdomen is soft, nontender, and well rounded. There were no palpable masses. I could not appreciate hepatosplenomegaly.  MUSCULOSKELETAL:  Symmetrical muscle tone appreciated in all four extremities.    SKIN: Skin turgor is normal. No pathologic skin lesions appreciated.  NEUROLOGIC:  Motor and sensation appear grossly normal.  Cranial nerves are grossly  without defect. PSYCH:  Alert and oriented to person, place and time. Affect is appropriate for situation.  Data Reviewed I have personally reviewed what is currently available of the patient's imaging, recent labs and medical records.   Labs:  CBC Latest Ref Rng & Units 07/15/2020 11/17/2019 03/16/2019  WBC 4.0 - 10.5 K/uL 14.0(H) 10.3 11.3(H)  Hemoglobin 12.0 - 15.0 g/dL 13.2 11.2 11.3(L)  Hematocrit 36 - 46 % 40.2 34.9 33.1(L)  Platelets 150 - 400 K/uL 224 259 226   CMP Latest Ref Rng & Units 07/15/2020 11/17/2019 03/16/2019  Glucose 70 - 99 mg/dL 112(H) 120(H) 129(H)  BUN 6 - 20 mg/dL 6 17 16   Creatinine 0.44 - 1.00 mg/dL 0.65 0.80 0.83  Sodium 135 - 145 mmol/L 137 143 136  Potassium 3.5 - 5.1 mmol/L 3.0(L) 3.1(L) 3.0(L)  Chloride 98 - 111 mmol/L 99 105 102  CO2 22 - 32 mmol/L 25 20 22   Calcium 8.9 - 10.3 mg/dL 8.6(L) 8.9 8.7(L)  Total Protein 6.5 - 8.1 g/dL 7.6 6.8 7.0  Total Bilirubin 0.3 - 1.2 mg/dL 0.5 0.2 0.6  Alkaline Phos 38 - 126 U/L 108 133(H) 104  AST 15 - 41 U/L 20 17 20   ALT 0 - 44 U/L 21 24 23       Imaging: Ultrasound images personally reviewed.  Multiple stones in the gallbladder, 1 approximately 1.5 cm. Within last 24 hrs: US Abdomen Limited RUQ (LIVER/GB)  Result Date: 07/15/2020 CLINICAL DATA:  Right upper quadrant pain EXAM: ULTRASOUND ABDOMEN LIMITED RIGHT UPPER QUADRANT COMPARISON:  05/30/2016 FINDINGS: Gallbladder: Multiple gallstones. The  gallbladder wall is thickened and partially hypoechoic, measuring at least 5 mm in thickness. There is a positive Murphy sign per sonographer exam. No clear pericholecystic edema. Common bile duct: Diameter: 5 mm Liver: No focal lesion identified. Within normal limits in parenchymal echogenicity. Portal vein is patent on color Doppler imaging with normal direction of blood flow towards the liver. IMPRESSION: Cholelithiasis and sonographic findings of acute cholecystitis. Electronically Signed   By: Monte Fantasia M.D.   On: 07/15/2020 08:48    Assessment    Acute calculus cholecystitis Patient Active Problem List   Diagnosis Date Noted  . OSA on CPAP 05/03/2019  . Coronary artery disease due to calcified coronary lesion 10/23/2018  . 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 Encounter for chronic pain management 12/13/2015  . Chronic pain 12/13/2015  . Asthma 06/05/2015  . Rheumatoid arthritis ([place]) 06/05/2015  . 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 Allergic rhinitis 03/15/2015  . ADD (attention deficit disorder) 12/29/2014  . 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 Back ache 12/29/2014  . Bipolar disorder ([place]) 12/29/2014  . Carpal tunnel syndrome 12/29/2014  . Chest pain 12/29/2014  . Chronic anxiety 12/29/2014  . Elevated blood sugar 12/29/2014  . Elevated rheumatoid factor 12/29/2014  . Acid reflux 12/29/2014  . Infectious human wart virus 12/29/2014  . Benign neoplasm of colon 12/29/2014  . Cephalalgia 12/29/2014  . Cannot sleep 12/29/2014  . Awareness of heartbeats 12/29/2014  . LAD (lymphadenopathy), retroperitoneal 12/29/2014  . Fast heart beat 12/29/2014  . Avitaminosis D 12/29/2014  . Paroxysmal digital cyanosis 02/17/2014  . Ventricular bigeminy 01/14/2012  . Hypertension 01/14/2012  . Abnormal EKG 01/14/2012    Plan    Robotic cholecystectomy.  This was discussed thoroughly.  Optimal plan is for robotic cholecystectomy.  Risks  and benefits have been discussed with the patient which include but are not limited to anesthesia, bleeding,  infection, biliary ductal injury or stenosis, other associated unanticipated injuries affiliated with laparoscopic surgery.  I believe there is the desire to proceed.  Questions elicited and answered to satisfaction.  No guarantees ever expressed or implied.  Face-to-face time spent with the patient and accompanying care providers(if present) was 45 minutes, with more than 50% of the time spent counseling, educating, and coordinating care of the patient.      Ronny Bacon M.D., FACS 07/15/2020, 10:08 AM

## 2020-07-15 NOTE — ED Notes (Signed)
Son at bedside. Surgeon talking with pt and son about procedure.

## 2020-07-15 NOTE — ED Notes (Signed)
Pt back from us

## 2020-07-15 NOTE — ED Notes (Signed)
Pt states L sided abdo pain since 3d ago when ate at restaurant. Rates pain at 8/10. LBM 3d ago 11/17. Bowel sounds audibe all  quadrants.

## 2020-07-15 NOTE — Anesthesia Preprocedure Evaluation (Signed)
Anesthesia Evaluation  Patient identified by MRN, date of birth, ID band Patient awake    Reviewed: Allergy & Precautions, NPO status , Patient's Chart, lab work & pertinent test results  History of Anesthesia Complications Negative for: history of anesthetic complications  Airway Mallampati: III       Dental  (+) Upper Dentures, Lower Dentures   Pulmonary sleep apnea and Continuous Positive Airway Pressure Ventilation , COPD,  COPD inhaler, former smoker,           Cardiovascular hypertension, Pt. on medications + CAD  (-) CHF (-) dysrhythmias (-) Valvular Problems/Murmurs     Neuro/Psych neg Seizures Anxiety Bipolar Disorder    GI/Hepatic Neg liver ROS, GERD  Medicated and Controlled,  Endo/Other  neg diabetes  Renal/GU negative Renal ROS     Musculoskeletal   Abdominal   Peds  Hematology   Anesthesia Other Findings   Reproductive/Obstetrics                             Anesthesia Physical Anesthesia Plan  ASA: III and emergent  Anesthesia Plan: General   Post-op Pain Management:    Induction: Intravenous  PONV Risk Score and Plan: 3 and Ondansetron and Dexamethasone  Airway Management Planned: Oral ETT  Additional Equipment:   Intra-op Plan:   Post-operative Plan:   Informed Consent: I have reviewed the patients History and Physical, chart, labs and discussed the procedure including the risks, benefits and alternatives for the proposed anesthesia with the patient or authorized representative who has indicated his/her understanding and acceptance.       Plan Discussed with:   Anesthesia Plan Comments:         Anesthesia Quick Evaluation

## 2020-07-15 NOTE — ED Triage Notes (Signed)
Patient reports abdominal pain for the past 3 days after eating at [facility].  Patient denies vomiting, states no bowel movement in several days.

## 2020-07-15 NOTE — ED Notes (Signed)
Surgeon at bedside.  

## 2020-07-15 NOTE — ED Notes (Signed)
Pt to bathroom for urine sample.

## 2020-07-15 NOTE — Consult Note (Addendum)
Medicine Consult Note  [name] MWN:[id number] DOB: October 25, 1965 DOA: 07/15/2020  PCP: [name], PA-C  Outpatient Specialists: Dr. Marlowe Sax, [facility] Patient coming from: home   I have personally briefly reviewed patient's old medical records in [place].  Chief Concern: Abdominal pain  H&P obtained from patient chart as she is still waking up from anesthesia on my evaluation  HPI: Susan Gilmore is a 54 y.o. female with medical history significant for hypertension, obesity, hyperlipidemia, anxiety/depression, toyed arthritis with positive rheumatoid factor presenting to the emergency department on 07/15/2020 for chief complaints of abdominal pain.  Per chart, patient presented for abdominal pain, primarily in the right upper quadrant, over the last 3 days.  Pain is constant and mainly in the left upper quadrant with sometimes radiating down to the right lower abdomen.  She dates the pain has been in the same place the whole time.  No associated vomiting or diarrhea.  The patient has not had a bowel movement in the last several days.  She denies any fever or chills and has no urinary symptoms.  She denies any prior history of this pain.  The pain started after she ate a meal at [employer].  Emergency provider ordered a ultrasound of the abdomen which revealed acute cholecystitis.  General surgery was consulted and admitted her for robotic cholecystectomy.  Upon patient waking up from surgery, EKG was ordered showing nonspecific T wave changes, specifically in leads II.  Troponin high-sensitivity was negative x1.  Medicine was consulted for evaluation and further recommendations.  At bedside, patient is sleepy and arousable to verbal and physical stimuli.  She is alert to self and age.  She is status post extubation and now on room air. She was able to tell me that she is not experiencing shortness of breath, chest pain, nausea, vomiting,  abdominal pain.  She reports that nothing is bothering her.  She endorses being sleepy.  Review of Systems: Unable to obtain as patient is waking up from surgery.   Assessment/Plan  Principal Problem:   Abnormal EKG Active Problems:   Hypertension   Rheumatoid arthritis (HCC)   OSA on CPAP   Acute cholecystitis due to biliary calculus   Obesity, Class III, BMI 40-49.9 (morbid obesity) (HCC)   Abnormal EKG-sinus rhythm with t-wave inversions and ST depressions, in multiple leads with rate of 89, QTc 513, present on previous EKGs specifically from 01/06/2016, 02/22/2017, 03/16/2019 -Initial HS was 7 and repeat was 8 -Low clinical suspicion for ACS at this time, recommend primary team to check a second high-sensitivity troponin -Regular follow-up with outpatient cardiologist   RUQ abdominal pain secondary to acute cholecystitis s/p robotic laparoscopy   Hypokalemia secondary to HCTZ use - replace with oral supplements  Hypertension-elevated on admission, likely secondary to pain from acute cholecystitis -Controlled now -Resume home antihypertensives: HCTZ 25 mg daily, amlodipine 5 mg daily, losartan 100 mg daily  -Regular follow-up with outpatient cardiologist   Obstructive sleep apnea-recommend CPAP nightly  Anxiety/depression - recommend resume home duloxetine 120 mg daily, xanax 1 mg prn for anxiety   Leukocytosis - suspect secondary to acute cholecystitis   Thank you for the opportunity to participate in the care of Susan Gilmore.  Medicine team will sign-off.   DVT prophylaxis: per primary   Code Status: FULL Diet: advanced as tolerated Family Communication: per primary Disposition Plan: per primary  Past Medical History:  Diagnosis Date  . Abnormal Pap smear 04/28/2012   normal  pap /positive hpv   . Abnormal stress test   . Allergic rhinitis   . Bipolar disorder ([place])   . Carpal tunnel syndrome   . Chest pain   . Chronic anxiety   . Contraception   . Cough   .  HPV (human papilloma virus) anogenital infection   . HTN (hypertension)   . Hyperplastic colon polyp   . Insomnia   . Palpitations   . Pneumonia    Past Surgical History:  Procedure Laterality Date  . full mouth dental    . GANGLION CYST EXCISION    . WISDOM TOOTH EXTRACTION     Social History:  reports that she quit smoking about 6 years ago. Her smoking use included cigarettes. She has a 26.00 pack-year smoking history. She has never used smokeless tobacco. She reports that she does not drink alcohol and does not use drugs.  Allergies  Allergen Reactions  . Effexor [Venlafaxine Hcl] Swelling  . Isosorbide Nitrate Other (See Comments)    Reaction: Bad headache   Family History  Problem Relation Age of Onset  . Stroke Paternal Grandmother   . Heart disease Paternal Grandmother   . Diabetes Maternal Grandmother   . Heart disease Maternal Grandmother   . Hypertension Maternal Grandmother   . Heart disease Maternal Grandfather   . Hypertension Maternal Grandfather   . Heart disease Father   . Stroke Father   . Hypertension Father   . Hyperlipidemia Father   . Diabetes Mother   . Heart attack Mother    Family history: Family history reviewed and positive for hypertension, hyperlipidemia, CVA, heart disease in father.  Prior to Admission medications   Medication Sig Start Date End Date Taking? Authorizing Provider  albuterol (PROAIR HFA) 108 (90 Base) MCG/ACT inhaler Inhale 1-2 puffs into the lungs every 6 (six) hours as needed for wheezing or shortness of breath. 12/10/17   [name], PA-C  ALPRAZolam [name]) 1 MG tablet Take 1 mg by mouth 4 (four) times daily as needed for anxiety.     [provider]  amLODipine (NORVASC) 5 MG tablet TAKE 1 TABLET BY MOUTH EVERY DAY 04/04/20   [name], MD  amphetamine-dextroamphetamine (ADDERALL XR) 30 MG 24 hr capsule TAKE 2 CAPSULES BY MOUTH IN THE MORNING 04/28/19   [provider]  aspirin EC 81  MG tablet Take 1 tablet (81 mg total) by mouth daily. 10/23/18   [name], MD  atorvastatin (LIPITOR) 40 MG tablet TAKE 1 TABLET BY MOUTH EVERY DAY 09/15/19   [name], MD  DULoxetine (CYMBALTA) 60 MG capsule Take 120 mg by mouth daily.     [provider]  fluticasone (FLONASE) 50 MCG/ACT nasal spray Place 2 sprays into both nostrils daily. Patient taking differently: Place 2 sprays into both nostrils 2 (two) times daily.  03/15/15   [name], PA-C  Fluticasone-Salmeterol (ADVAIR) 250-50 MCG/DOSE AEPB Inhale into the lungs.    [provider]  hydrochlorothiazide (HYDRODIURIL) 25 MG tablet TAKE 1 TABLET BY MOUTH EVERY DAY 06/29/20   [name], PA-C  HYDROcodone-acetaminophen (NORCO) 5-325 MG tablet Take 2 tablets by mouth every 6 (six) hours as needed for moderate pain. 07/15/20   Ronny Bacon, MD  ibuprofen (ADVIL) 800 MG tablet Take 1 tablet (800 mg total) by mouth every 8 (eight) hours as needed. 07/15/20   Ronny Bacon, MD  ipratropium-albuterol (DUONEB) 0.5-2.5 (3) MG/3ML SOLN TAKE 3 MLS BY NEBULIZATION EVERY 6 (SIX) HOURS AS NEEDED (  SHORTNESS OF BREATH). 06/14/20   [name], PA-C  leflunomide (ARAVA) 20 MG tablet Take 20 mg by mouth daily. 03/29/20   [provider]  loratadine (CLARITIN) 10 MG tablet Take 1 tablet by mouth daily as needed for allergies.  12/19/14   [provider]  losartan (COZAAR) 100 MG tablet TAKE 1 TABLET BY MOUTH EVERY DAY 06/10/20   [name], PA-C  montelukast (SINGULAIR) 10 MG tablet Take 1 tablet by mouth daily as needed.  12/19/14   [provider]  omeprazole (PRILOSEC) 20 MG capsule Take 1 capsule (20 mg total) by mouth daily. Patient taking differently: Take 20 mg by mouth daily. As needed 03/21/15   [name], PA-C  Oxycodone HCl 10 MG TABS Take 1 tablet (10 mg total) by mouth every 6 (six) hours as needed. For severe pain 04/26/20    [name], PA-C  potassium citrate (UROCIT-K) 10 MEQ (1080 MG) SR tablet Take 1 tablet (10 mEq total) by mouth daily. 10/21/18   [name], PA-C  predniSONE (DELTASONE) 5 MG tablet Take by mouth. 02/29/20   [provider]  zolpidem (AMBIEN) 10 MG tablet Take 10 mg by mouth at bedtime.  02/01/18   [provider]   Physical Exam: Vitals:   07/15/20 1821 07/15/20 1830 07/15/20 1835 07/15/20 1842  BP:   127/79   Pulse: 87 85 84 82  Resp: 12 17 13 12   Temp:      TempSrc:      SpO2: 94% 97% 95% 94%  Weight:      Height:       Constitutional: appears older than chronological age, NAD, calm, comfortable Eyes: PERRL, lids and conjunctivae normal ENMT: Mucous membranes are moist. Posterior pharynx clear of any exudate or lesions. Age-appropriate dentition. Hearing appropriate Neck: normal, supple, no masses, no thyromegaly Respiratory: clear to auscultation bilaterally, no wheezing, no crackles. Normal respiratory effort. No accessory muscle use.  Cardiovascular: Regular rate and rhythm, no murmurs / rubs / gallops. No extremity edema. 2+ pedal pulses. No carotid bruits.  Abdomen: obese abdomen, 4 laparoscopic incision sites with dermabond in place with mild redness consistent with recent procedure. Bowel sounds positive.  Musculoskeletal: no clubbing / cyanosis. No joint deformity upper and lower extremities. Good ROM, no contractures, no atrophy. Normal muscle tone.  Skin: no rashes, lesions, ulcers. No induration Neurologic:  Sensation intact. Strength 5/5 in all 4.  Psychiatric: Normal judgment and insight. Alert and oriented x 3. Normal mood.   EKG: Independently reviewed, showing normal sinus rhythm with a rate of 89.  QTc 513.  Nonspecific T wave changes present on previous EKG specifically 03/16/2019 (and Qtc was 505), 02/22/2017 (Qtc 475), 01/06/2016.  Chest x-ray on Admission: Personally reviewed and I agree with radiologist reading as below.  US  Abdomen Limited RUQ (LIVER/GB)  Result Date: 07/15/2020 CLINICAL DATA:  Right upper quadrant pain EXAM: ULTRASOUND ABDOMEN LIMITED RIGHT UPPER QUADRANT COMPARISON:  05/30/2016 FINDINGS: Gallbladder: Multiple gallstones. The gallbladder wall is thickened and partially hypoechoic, measuring at least 5 mm in thickness. There is a positive Murphy sign per sonographer exam. No clear pericholecystic edema. Common bile duct: Diameter: 5 mm Liver: No focal lesion identified. Within normal limits in parenchymal echogenicity. Portal vein is patent on color Doppler imaging with normal direction of blood flow towards the liver. IMPRESSION: Cholelithiasis and sonographic findings of acute cholecystitis. Electronically Signed   By: Monte Fantasia M.D.   On: 07/15/2020 08:48  Labs on Admission: I have personally reviewed following labs  HS Troponin  CBC: Recent Labs  Lab 07/15/20 0352  WBC 14.0*  HGB 13.2  HCT 40.2  MCV 76.3*  PLT 579   Basic Metabolic Panel: Recent Labs  Lab 07/15/20 0352  NA 137  K 3.0*  CL 99  CO2 25  GLUCOSE 112*  BUN 6  CREATININE 0.65  CALCIUM 8.6*   GFR: Estimated Creatinine Clearance: 84.3 mL/min (by C-G formula based on SCr of 0.65 mg/dL).  Liver Function Tests: Recent Labs  Lab 07/15/20 0352  AST 20  ALT 21  ALKPHOS 108  BILITOT 0.5  PROT 7.6  ALBUMIN 3.8   Recent Labs  Lab 07/15/20 0352  LIPASE 23   Urine analysis:    Component Value Date/Time   COLORURINE YELLOW (A) 07/15/2020 1053   APPEARANCEUR HAZY (A) 07/15/2020 1053   LABSPEC 1.004 (L) 07/15/2020 1053   PHURINE 7.0 07/15/2020 1053   GLUCOSEU NEGATIVE 07/15/2020 1053   HGBUR NEGATIVE 07/15/2020 [place] 07/15/2020 1053   BILIRUBINUR Negative 07/08/2018 [place] 07/15/2020 1053   PROTEINUR NEGATIVE 07/15/2020 1053   UROBILINOGEN 0.2 07/08/2018 0714   NITRITE NEGATIVE 07/15/2020 1053   LEUKOCYTESUR LARGE (A) 07/15/2020 1053   Saulo Anthis N Mitzy Naron D.O. [facility]  If 12AM-7AM, please contact overnight-coverage provider If 7AM-7PM, please contact day coverage provider [email or web address]  07/15/2020, 6:54 PM

## 2020-07-15 NOTE — Op Note (Signed)
Robotic cholecystectomy  Pre-operative Diagnosis: Acute calculus cholecystitis  Post-operative Diagnosis:  Same.  Procedure: Robotic assisted laparoscopic cholecystectomy.  Surgeon: Ronny Bacon, M.D., FACS  Anesthesia: General. with endotracheal tube  Findings: Markedly edema to [place] and distended/tense gallbladder with thickened wall.  No gangrene noted.  Estimated Blood Loss: 15 mL         Drains: None         Specimens: Gallbladder           Complications: none  Procedure Details  The patient was seen again in the Holding Room.  5 mg dose of ICG was administered intravenously, preop.  The benefits, complications, treatment options, risks and expected outcomes were discussed with the patient. The likelihood of improving the patient's symptoms with return to their baseline status is good.  The patient and/or family concurred with the proposed plan, giving informed consent, again alternatives reviewed.  The patient was taken to Operating Room, identified, and the procedure verified as robotic assisted laparoscopic cholecystectomy.  Prior to the induction of general anesthesia, antibiotic prophylaxis was administered. VTE prophylaxis was in place. General endotracheal anesthesia was then administered and tolerated well. The patient was positioned in the supine position.  After the induction, the abdomen was prepped with Chloraprep and draped in the sterile fashion.  A Time Out was held and the above information confirmed.  Right infra-umbilical local infiltration with quarter percent Marcaine with epinephrine is utilized.  Made a 12 mm incision on the left periumbilical site, I advanced an optical 56mm port under direct visualization into the peritoneal cavity.  Once the peritoneum was penetrated, insufflation was transferred.  The trocar was then advanced into the abdominal cavity under direct visualization. Pneumoperitoneum was then continued with CO2 at 14 mmHg or less and tolerated  well without any adverse changes in the patient's vital signs.  Two 8.5-mm ports were placed in the left lower quadrant and laterally, and one to the right lower quadrant, all under direct vision. All skin incisions  were infiltrated with a local anesthetic agent before making the incision and placing the trocars.  The patient was positioned  in reverse Trendelenburg, tilted the patient's left side down.  Da Vinci XI robot was then positioned on to the patient's left side, and docked.  The gallbladder was identified, the fundus grasped via the arm 4 Prograsp and retracted cephalad. Adhesions were lysed with scissors and cautery.  The infundibulum was identified grasped and retracted laterally, exposing the peritoneum overlying the triangle of Calot. This was then opened and dissected using cautery & scissors. An extended critical view of the cystic duct and cystic artery was obtained, aided by the ICG via FireFly which enabled ready visualization of the common bile ductal location.  The gallbladder and cystic duct never received the ICG.  The cystic duct was clearly identified and dissected to isolation.   Artery well isolated and clipped, and the cystic duct was triple clipped and divided with scissors, leaving two on the remaining stump.  The specimen side of the artery is sealed with bipolar and divided with monopolar scissors.   The gallbladder was taken from the gallbladder fossa in a retrograde fashion with the electrocautery. The gallbladder was removed and placed in an Endocatch bag.  The liver bed is inspected. Hemostasis was confirmed.  The robot was undocked and moved away from the operative field. 3 L of saline solution irrigation was utilized and was aspirated clear.  The gallbladder and Endocatch sac were then removed through  the infraumbilical port site.  Significant extension of the port site was required to enable removal of the markedly distended thickened and edematous  gallbladder.  Inspection of the right upper quadrant was performed. No bleeding, bile duct injury or leak, or bowel injury was noted. The infra-umbilical port site fascia was closed with interrumpted 0 Vicryl sutures under direct visualization.  This area was evaluated laparoscopically.  Pneumoperitoneum was released and ports removed.  4-0 subcuticular Monocryl was used to close the skin. Dermabond was  applied.  The patient was then extubated and brought to the recovery room in stable condition. Sponge, lap, and needle counts were correct at closure and at the conclusion of the case.          Due to reported ST changes during the surgery, we will anticipate admitting her for further cardiac work-up, likely cardiology consultation.  Ronny Bacon, M.D., [facility] 07/15/2020 5:51 PM

## 2020-07-15 NOTE — ED Notes (Signed)
Pt to u/s

## 2020-07-15 NOTE — ED Notes (Signed)
OR called. They will be coming to get pt soon.

## 2020-07-15 NOTE — ED Provider Notes (Addendum)
[facility] Emergency Department Provider Note ____________________________________________   First MD Initiated Contact with Patient 07/15/20 0730     (approximate)  I have reviewed the triage vital signs and the nursing notes.   HISTORY  Chief Complaint Abdominal Pain    HPI Susan Gilmore is a 54 y.o. female with PMH as noted below who presents with abdominal pain over the last 3 days, constant, and mainly in the right upper quadrant although sometimes radiating down to the right lower abdomen.  She states that the pain has been in the same place the whole time.  It is not associated with vomiting or diarrhea.  The patient has not had a bowel movement in the last several days.  She denies any fever or chills and has no urinary symptoms.  She denies any prior history of this pain.  The pain started after she ate a meal at [place].  Past Medical History:  Diagnosis Date  . Abnormal Pap smear 04/28/2012   normal pap /positive hpv   . Abnormal stress test   . Allergic rhinitis   . Bipolar disorder ([place])   . Carpal tunnel syndrome   . Chest pain   . Chronic anxiety   . Contraception   . Cough   . HPV (human papilloma virus) anogenital infection   . HTN (hypertension)   . Hyperplastic colon polyp   . Insomnia   . Palpitations   . Pneumonia     Patient Active Problem List   Diagnosis Date Noted  . OSA on CPAP 05/03/2019  . Coronary artery disease due to calcified coronary lesion 10/23/2018  . 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 Encounter for chronic pain management 12/13/2015  . Chronic pain 12/13/2015  . Asthma 06/05/2015  . Rheumatoid arthritis ([place]) 06/05/2015  . 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 Allergic rhinitis 03/15/2015  . ADD (attention deficit disorder) 12/29/2014  . 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 Back ache 12/29/2014  . Bipolar disorder ([place]) 12/29/2014  . Carpal tunnel syndrome  12/29/2014  . Chest pain 12/29/2014  . Chronic anxiety 12/29/2014  . Elevated blood sugar 12/29/2014  . Elevated rheumatoid factor 12/29/2014  . Acid reflux 12/29/2014  . Infectious human wart virus 12/29/2014  . Benign neoplasm of colon 12/29/2014  . Cephalalgia 12/29/2014  . Cannot sleep 12/29/2014  . Awareness of heartbeats 12/29/2014  . LAD (lymphadenopathy), retroperitoneal 12/29/2014  . Fast heart beat 12/29/2014  . Avitaminosis D 12/29/2014  . Paroxysmal digital cyanosis 02/17/2014  . Ventricular bigeminy 01/14/2012  . Hypertension 01/14/2012  . Abnormal EKG 01/14/2012    Past Surgical History:  Procedure Laterality Date  . full mouth dental    . GANGLION CYST EXCISION    . WISDOM TOOTH EXTRACTION      Prior to Admission medications   Medication Sig Start Date End Date Taking? Authorizing Provider  albuterol (PROAIR HFA) 108 (90 Base) MCG/ACT inhaler Inhale 1-2 puffs into the lungs every 6 (six) hours as needed for wheezing or shortness of breath. 12/10/17   [name], PA-C  ALPRAZolam [name]) 1 MG tablet Take 1 mg by mouth 4 (four) times daily as needed for anxiety.     [provider]  amLODipine (NORVASC) 5 MG tablet TAKE 1 TABLET BY MOUTH EVERY DAY 04/04/20   [name], MD  amphetamine-dextroamphetamine (ADDERALL XR) 30 MG 24 hr capsule TAKE 2 CAPSULES BY MOUTH IN THE MORNING 04/28/19   [provider]  aspirin EC 81 MG tablet Take 1 tablet (81 mg total) by mouth daily. 10/23/18   [name], MD  atorvastatin (LIPITOR) 40 MG tablet TAKE 1 TABLET BY MOUTH EVERY DAY 09/15/19   [name], MD  DULoxetine (CYMBALTA) 60 MG capsule Take 120 mg by mouth daily.     [provider]  fluticasone (FLONASE) 50 MCG/ACT nasal spray Place 2 sprays into both nostrils daily. Patient taking differently: Place 2 sprays into both nostrils 2 (two) times daily.  03/15/15   [name], PA-C  Fluticasone-Salmeterol  (ADVAIR) 250-50 MCG/DOSE AEPB Inhale into the lungs.    [provider]  hydrochlorothiazide (HYDRODIURIL) 25 MG tablet TAKE 1 TABLET BY MOUTH EVERY DAY 06/29/20   [name], PA-C  ipratropium-albuterol (DUONEB) 0.5-2.5 (3) MG/3ML SOLN TAKE 3 MLS BY NEBULIZATION EVERY 6 (SIX) HOURS AS NEEDED (SHORTNESS OF BREATH). 06/14/20   [name], PA-C  leflunomide (ARAVA) 20 MG tablet Take 20 mg by mouth daily. 03/29/20   [provider]  loratadine (CLARITIN) 10 MG tablet Take 1 tablet by mouth daily as needed for allergies.  12/19/14   [provider]  losartan (COZAAR) 100 MG tablet TAKE 1 TABLET BY MOUTH EVERY DAY 06/10/20   [name], PA-C  montelukast (SINGULAIR) 10 MG tablet Take 1 tablet by mouth daily as needed.  12/19/14   [provider]  omeprazole (PRILOSEC) 20 MG capsule Take 1 capsule (20 mg total) by mouth daily. Patient taking differently: Take 20 mg by mouth daily. As needed 03/21/15   [name], PA-C  Oxycodone HCl 10 MG TABS Take 1 tablet (10 mg total) by mouth every 6 (six) hours as needed. For severe pain 04/26/20   [name], PA-C  potassium citrate (UROCIT-K) 10 MEQ (1080 MG) SR tablet Take 1 tablet (10 mEq total) by mouth daily. 10/21/18   [name], PA-C  predniSONE (DELTASONE) 5 MG tablet Take by mouth. 02/29/20   [provider]  zolpidem (AMBIEN) 10 MG tablet Take 10 mg by mouth at bedtime.  02/01/18   [provider]    Allergies Effexor [venlafaxine hcl] and Isosorbide nitrate  Family History  Problem Relation Age of Onset  . Stroke Paternal Grandmother   . Heart disease Paternal Grandmother   . Diabetes Maternal Grandmother   . Heart disease Maternal Grandmother   . Hypertension Maternal Grandmother   . Heart disease Maternal Grandfather   . Hypertension Maternal Grandfather   . Heart disease Father   . Stroke Father   . Hypertension Father   . Hyperlipidemia  Father   . Diabetes Mother   . Heart attack Mother     Social History Social History   Tobacco Use  . Smoking status: Former Smoker    Packs/day: 1.00    Years: 26.00    Pack years: 26.00    Types: Cigarettes    Quit date: 07/26/2013    Years since quitting: 6.9  . Smokeless tobacco: Never Used  Vaping Use  . Vaping Use: Some days  Substance Use Topics  . Alcohol use: No    Alcohol/week: 0.0 standard drinks    Comment: Had a history of alcohol use but currently is not drinking any  . Drug use: No    Review of Systems  Constitutional: No fever. Eyes: No redness. ENT: No sore throat. Cardiovascular: Denies chest pain. Respiratory: Denies shortness of breath. Gastrointestinal: No vomiting or diarrhea.  Genitourinary: Negative for dysuria.  Musculoskeletal: Negative for back pain. Skin: Negative for rash. Neurological: Negative for headache.   ____________________________________________   PHYSICAL EXAM:  VITAL SIGNS: ED Triage Vitals  Enc Vitals Group     BP 07/15/20 0349 (!) 159/84     Pulse Rate 07/15/20 0349 97     Resp 07/15/20 0349 20     Temp 07/15/20 0349 98 F (36.7 C)     Temp Source 07/15/20 0349 Oral     SpO2 07/15/20 0349 97 %     Weight 07/15/20 0349 207 lb (93.9 kg)     Height 07/15/20 0349 5' (1.524 m)     Head Circumference --      Peak Flow --      Pain Score 07/15/20 0348 8     Pain Loc --      Pain Edu? --      Excl. in [place]? --     Constitutional: Alert and oriented.  Relatively well appearing and in no acute distress. Eyes: Conjunctivae are normal.  No scleral icterus. Head: Atraumatic. Nose: No congestion/rhinnorhea. Mouth/Throat: Mucous membranes are moist.   Neck: Normal range of motion.  Cardiovascular: Normal rate, regular rhythm.  Good peripheral circulation. Respiratory: Normal respiratory effort.  No retractions.  Gastrointestinal: Soft with moderate right upper quadrant and mild with right lower quadrant tenderness.  No  distention.  Genitourinary: No flank tenderness. Musculoskeletal: Extremities warm and well perfused.  Neurologic:  Normal speech and language. No gross focal neurologic deficits are appreciated.  Skin:  Skin is warm and dry. No rash noted. Psychiatric: Mood and affect are normal. Speech and behavior are normal.  ____________________________________________   LABS (all labs ordered are listed, but only abnormal results are displayed)  Labs Reviewed  COMPREHENSIVE METABOLIC PANEL - Abnormal; Notable for the following components:      Result Value   Potassium 3.0 (*)    Glucose, Bld 112 (*)    Calcium 8.6 (*)    All other components within normal limits  CBC - Abnormal; Notable for the following components:   WBC 14.0 (*)    RBC 5.27 (*)    MCV 76.3 (*)    MCH 25.0 (*)    RDW 17.2 (*)    All other components within normal limits  URINALYSIS, COMPLETE (UACMP) WITH MICROSCOPIC - Abnormal; Notable for the following components:   Color, Urine YELLOW (*)    APPearance HAZY (*)    Specific Gravity, Urine 1.004 (*)    Leukocytes,Ua LARGE (*)    Bacteria, UA RARE (*)    All other components within normal limits  RESP PANEL BY RT-PCR (FLU A&B, COVID) ARPGX2  LIPASE, BLOOD  POC URINE PREG, ED   ____________________________________________  EKG  ED ECG REPORT I, Arta Silence, the attending physician, personally viewed and interpreted this ECG.  Date: 07/15/2020 EKG Time: 0848 Rate: 77 Rhythm: normal sinus rhythm QRS Axis: normal Intervals: Prolonged QTc ST/T Wave abnormalities: Nonspecific ST abnormalities laterally Narrative Interpretation: Nonspecific abnormalities with no evidence of acute ischemia; no significant change when compared to EKG of 04/07/2020   ____________________________________________  RADIOLOGY  US abdomen RUQ: Findings suspicious for acute cholecystitis  ____________________________________________   PROCEDURES  Procedure(s) performed:  No  Procedures  Critical Care performed: No ____________________________________________   INITIAL IMPRESSION / ASSESSMENT AND PLAN / ED COURSE  Pertinent labs & imaging results that were available during my care of the patient were reviewed by me and considered in my medical decision making (see chart for  details).  54 year old female with PMH as noted above presents with primarily right upper quadrant abdominal pain over the last 3 days after eating a meal at [place].  She has no vomiting or diarrhea.  On exam, the patient is overall well-appearing.  Her vital signs are normal except for mild hypertension.  The abdomen is soft with moderate right upper quadrant tenderness, and some tenderness into the right lower quadrant.  Differential includes biliary colic, cholecystitis, other hepatobiliary cause, gastroenteritis or foodborne illness (although I would expect the presence of vomiting and/or diarrhea) or less likely colitis, diverticulitis, or appendicitis.  There is no evidence of urinary etiology.  Initial lab work-up is significant for an elevated WBC count.  We will obtain an ultrasound of the right upper quadrant and reassess.  ----------------------------------------- 12:24 PM on 07/15/2020 -----------------------------------------  Ultrasound shows findings concerning for acute cholecystitis.  I consulted Dr. Christian Mate from general surgery who has come to evaluate the patient and will admit her.  ____________________________________________   FINAL CLINICAL IMPRESSION(S) / ED DIAGNOSES  Final diagnoses:  Right upper quadrant abdominal pain      NEW MEDICATIONS STARTED DURING THIS VISIT:  New Prescriptions   No medications on file     Note:  This document was prepared using Dragon voice recognition software and may include unintentional dictation errors.    Arta Silence, MD 07/15/20 1224    Arta Silence, MD 07/15/20 [phone]

## 2020-07-16 DIAGNOSIS — K819 Cholecystitis, unspecified: Secondary | ICD-10-CM | POA: Diagnosis present

## 2020-07-16 LAB — COMPREHENSIVE METABOLIC PANEL
ALT: 60 U/L — ABNORMAL HIGH (ref 0–44)
AST: 84 U/L — ABNORMAL HIGH (ref 15–41)
Albumin: 3.1 g/dL — ABNORMAL LOW (ref 3.5–5.0)
Alkaline Phosphatase: 144 U/L — ABNORMAL HIGH (ref 38–126)
Anion gap: 11 (ref 5–15)
BUN: 11 mg/dL (ref 6–20)
CO2: 25 mmol/L (ref 22–32)
Calcium: 8.3 mg/dL — ABNORMAL LOW (ref 8.9–10.3)
Chloride: 102 mmol/L (ref 98–111)
Creatinine, Ser: 0.72 mg/dL (ref 0.44–1.00)
GFR, Estimated: >60 mL/min (ref >60)
Glucose, Bld: 147 mg/dL — ABNORMAL HIGH (ref 70–99)
Potassium: 3.8 mmol/L (ref 3.5–5.1)
Sodium: 138 mmol/L (ref 135–145)
Total Bilirubin: 0.5 mg/dL (ref 0.3–1.2)
Total Protein: 6.3 g/dL — ABNORMAL LOW (ref 6.5–8.1)

## 2020-07-16 LAB — CBC
HCT: 35.8 % — ABNORMAL LOW (ref 36.0–46.0)
Hemoglobin: 11.1 g/dL — ABNORMAL LOW (ref 12.0–15.0)
MCH: 24.3 pg — ABNORMAL LOW (ref 26.0–34.0)
MCHC: 31 g/dL (ref 30.0–36.0)
MCV: 78.3 fL — ABNORMAL LOW (ref 80.0–100.0)
Platelets: 198 10*3/uL (ref 150–400)
RBC: 4.57 MIL/uL (ref 3.87–5.11)
RDW: 17.2 % — ABNORMAL HIGH (ref 11.5–15.5)
WBC: 11 10*3/uL — ABNORMAL HIGH (ref 4.0–10.5)
nRBC: 0 % (ref 0.0–0.2)

## 2020-07-16 LAB — HIV ANTIBODY (ROUTINE TESTING W REFLEX): HIV Screen 4th Generation wRfx: NONREACTIVE

## 2020-07-16 MED ORDER — HYDROCODONE-ACETAMINOPHEN 5-325 MG PO TABS: 1.0000 | ORAL_TABLET | Freq: Four times a day (QID) | ORAL | 0 refills | Status: DC | PRN

## 2020-07-16 MED ORDER — IBUPROFEN 800 MG PO TABS: 800.0000 mg | ORAL_TABLET | Freq: Three times a day (TID) | ORAL | 0 refills | Status: DC | PRN

## 2020-07-16 NOTE — Discharge Summary (Signed)
Physician Discharge Summary  Patient ID: Susan Gilmore MRN: [id number] DOB/AGE: 52-Dec-1967 52 y.o.  Admit date: 07/15/2020 Discharge date: 07/16/2020  Admission Diagnoses:  Acute calculus cholecystitis  Discharge Diagnoses:   Principal Problem:   Abnormal EKG Active Problems:   Hypertension   Rheumatoid arthritis (HCC)   OSA on CPAP   Acute cholecystitis due to biliary calculus   Obesity, Class III, BMI 40-49.9 (morbid obesity) ([place])   Discharged Condition: good  Hospital Course: Anticipated discharge post-op, however ST changes were noted intra-op.  These didn't seems significant post-op, but a series of Troponin confirmed negative.  She remained asymptomatic overnight.  She feels great today.    Consults: Hosptialist  Treatments: surgery: Robotic cholecystectomy.   Discharge Exam: Blood pressure (!) 166/86, pulse (!) 102, temperature 97.9 F (36.6 C), temperature source Oral, resp. rate 20, height 5' (1.524 m), weight 93.9 kg, SpO2 96 %. Cardio: regular rate and rhythm, S1, S2 normal, no murmur, click, rub or gallop GI: soft, non-tender; bowel sounds normal; no masses,  no organomegaly.  Incisions clean, dry and intact.  Disposition: Discharge disposition: 01-Home or Self Care       Discharge Instructions    Call MD for:  persistant nausea and vomiting   Complete by: As directed    Call MD for:  redness, tenderness, or signs of infection (pain, swelling, redness, odor or green/yellow discharge around incision site)   Complete by: As directed    Call MD for:  severe uncontrolled pain   Complete by: As directed    Diet - low sodium heart healthy   Complete by: As directed    Discharge wound care:   Complete by: As directed    Your incision was closed with Dermabond.  It is best to keep it clean and dry, it will tolerate a brief shower, but do not soak it or apply any creams or lotions to the incisions.  The Dermabond should gradually flake off over time.  Keep  it open to air so you can evaluate your incisions.  Dermabond assists the underlying sutures to keep your incision closed and protected from infection.  Should you develop some drainage from your incision, some drops of drainage would be okay but if it persists continue to put keep a dry dressing over it.   Driving Restrictions   Complete by: As directed    No driving until cleared after follow-up appointment.  Is not advised to drive while taking narcotic pain medications or in significant pain.   Increase activity slowly   Complete by: As directed    Lifting restrictions   Complete by: As directed    Strongly advised against any form of lifting greater than 15 pounds over the next 4 to 6 weeks.  This involves pushing/pulling movements as well.  After 4 weeks when may gradually engage in more activities remaining aware of any new pain/tenderness elicited, and avoiding those for the full duration of 6 weeks.  Walking is encouraged.  Climbing stairs with caution.     Allergies as of 07/16/2020      Reactions   Effexor [venlafaxine Hcl] Swelling   Isosorbide Nitrate Other (See Comments)   Reaction: Bad headache      Medication List    TAKE these medications   albuterol 108 (90 Base) MCG/ACT inhaler Commonly known as: ProAir HFA Inhale 1-2 puffs into the lungs every 6 (six) hours as needed for wheezing or shortness of breath.   ALPRAZolam 1 MG tablet  Commonly known as: XANAX Take 1 mg by mouth 4 (four) times daily as needed for anxiety.   amLODipine 5 MG tablet Commonly known as: NORVASC TAKE 1 TABLET BY MOUTH EVERY DAY   amphetamine-dextroamphetamine 30 MG 24 hr capsule Commonly known as: ADDERALL XR TAKE 2 CAPSULES BY MOUTH IN THE MORNING   aspirin EC 81 MG tablet Take 1 tablet (81 mg total) by mouth daily.   atorvastatin 40 MG tablet Commonly known as: LIPITOR TAKE 1 TABLET BY MOUTH EVERY DAY   DULoxetine 60 MG capsule Commonly known as: CYMBALTA Take 120 mg by mouth  daily.   fluticasone 50 MCG/ACT nasal spray Commonly known as: FLONASE Place 2 sprays into both nostrils daily. What changed: when to take this   Fluticasone-Salmeterol 250-50 MCG/DOSE Aepb Commonly known as: ADVAIR Inhale into the lungs.   hydrochlorothiazide 25 MG tablet Commonly known as: HYDRODIURIL TAKE 1 TABLET BY MOUTH EVERY DAY   HYDROcodone-acetaminophen 5-325 MG tablet Commonly known as: NORCO/VICODIN Take 1 tablet by mouth every 6 (six) hours as needed for moderate pain.   ibuprofen 800 MG tablet Commonly known as: ADVIL Take 1 tablet (800 mg total) by mouth every 8 (eight) hours as needed.   ipratropium-albuterol 0.5-2.5 (3) MG/3ML Soln Commonly known as: DUONEB TAKE 3 MLS BY NEBULIZATION EVERY 6 (SIX) HOURS AS NEEDED (SHORTNESS OF BREATH).   leflunomide 20 MG tablet Commonly known as: ARAVA Take 20 mg by mouth daily.   loratadine 10 MG tablet Commonly known as: CLARITIN Take 1 tablet by mouth daily as needed for allergies.   losartan 100 MG tablet Commonly known as: COZAAR TAKE 1 TABLET BY MOUTH EVERY DAY   montelukast 10 MG tablet Commonly known as: SINGULAIR Take 1 tablet by mouth daily as needed.   omeprazole 20 MG capsule Commonly known as: PRILOSEC Take 1 capsule (20 mg total) by mouth daily. What changed: additional instructions   Oxycodone HCl 10 MG Tabs Take 1 tablet (10 mg total) by mouth every 6 (six) hours as needed. For severe pain   potassium citrate 10 MEQ (1080 MG) SR tablet Commonly known as: UROCIT-K Take 1 tablet (10 mEq total) by mouth daily.   predniSONE 5 MG tablet Commonly known as: DELTASONE Take 5 mg by mouth daily with breakfast.   zolpidem 10 MG tablet Commonly known as: AMBIEN Take 10 mg by mouth at bedtime.            Discharge Care Instructions  (From admission, onward)         Start     Ordered   07/16/20 0000  Discharge wound care:       Comments: Your incision was closed with Dermabond.  It is best  to keep it clean and dry, it will tolerate a brief shower, but do not soak it or apply any creams or lotions to the incisions.  The Dermabond should gradually flake off over time.  Keep it open to air so you can evaluate your incisions.  Dermabond assists the underlying sutures to keep your incision closed and protected from infection.  Should you develop some drainage from your incision, some drops of drainage would be okay but if it persists continue to put keep a dry dressing over it.   07/16/20 1336          Follow-up Information    Ronny Bacon, MD Follow up in 2 week(s).   Specialty: General Surgery Contact information: [address] [place] [address] [phone]  Signed: Ronny Bacon, M.D., [facility] 07/16/2020, 1:37 PM

## 2020-07-16 NOTE — Care Management CC44 (Signed)
Condition Code 44 Documentation Completed  Patient Details  Name: REMMINGTON TETERS MRN: [id number] Date of Birth: 01/29/1966   Condition Code 44 given:  Yes Patient signature on Condition Code 44 notice:  Yes Documentation of 2 MD's agreement:  Yes Code 44 added to claim:  Yes    Harriet Masson, RN 07/16/2020, 2:48 PM

## 2020-07-16 NOTE — Care Management Obs Status (Signed)
[place] NOTIFICATION   Patient Details  Name: Susan Gilmore MRN: [id number] Date of Birth: December 23, 1965   Medicare Observation Status Notification Given:  (P) Yes    Harriet Masson, RN 07/16/2020, 2:47 PM

## 2020-07-18 LAB — SURGICAL PATHOLOGY

## 2020-08-01 ENCOUNTER — Encounter: Payer: Self-pay | Admitting: Surgery

## 2020-08-01 ENCOUNTER — Other Ambulatory Visit: Payer: Self-pay

## 2020-08-01 ENCOUNTER — Ambulatory Visit (INDEPENDENT_AMBULATORY_CARE_PROVIDER_SITE_OTHER): Payer: Medicare Other | Admitting: Surgery

## 2020-08-01 VITALS — BP 156/74 | HR 90 | Temp 98.5°F | Ht 61.0 in | Wt 198.2 lb

## 2020-08-01 DIAGNOSIS — Z9049 Acquired absence of other specified parts of digestive tract: Secondary | ICD-10-CM | POA: Insufficient documentation

## 2020-08-01 NOTE — Patient Instructions (Addendum)
If you have any concerns or questions, please feel free to call our office.    GENERAL POST-OPERATIVE PATIENT INSTRUCTIONS   WOUND CARE INSTRUCTIONS:  Keep a dry clean dressing on the wound if there is drainage. The initial bandage may be removed after 24 hours.  Once the wound has quit draining you may leave it open to air.  If clothing rubs against the wound or causes irritation and the wound is not draining you may cover it with a dry dressing during the daytime.  Try to keep the wound dry and avoid ointments on the wound unless directed to do so.  If the wound becomes bright red and painful or starts to drain infected material that is not clear, please contact your physician immediately.  If the wound is mildly pink and has a thick firm ridge underneath it, this is normal, and is referred to as a healing ridge.  This will resolve over the next 4-6 weeks.  BATHING: You may shower if you have been informed of this by your surgeon. However, Please do not submerge in a tub, hot tub, or pool until incisions are completely sealed or have been told by your surgeon that you may do so.  DIET:  You may eat any foods that you can tolerate.  It is a good idea to eat a high fiber diet and take in plenty of fluids to prevent constipation.  If you do become constipated you may want to take a mild laxative or take ducolax tablets on a daily basis until your bowel habits are regular.  Constipation can be very uncomfortable, along with straining, after recent surgery.  ACTIVITY:  You are encouraged to cough and deep breath or use your incentive spirometer if you were given one, every 15-30 minutes when awake.  This will help prevent respiratory complications and low grade fevers post-operatively if you had a general anesthetic.  You may want to hug a pillow when coughing and sneezing to add additional support to the surgical area, if you had abdominal or chest surgery, which will decrease pain during these times.   You are encouraged to walk and engage in light activity for the next two weeks.  You should not lift more than 15 pounds, until 08/26/2020 as it could put you at increased risk for complications.  Twenty pounds is roughly equivalent to a plastic bag of groceries. At that time- Listen to your body when lifting, if you have pain when lifting, stop and then try again in a few days. Soreness after doing exercises or activities of daily living is normal as you get back in to your normal routine.  MEDICATIONS:  Try to take narcotic medications and anti-inflammatory medications, such as tylenol, ibuprofen, naprosyn, etc., with food.  This will minimize stomach upset from the medication.  Should you develop nausea and vomiting from the pain medication, or develop a rash, please discontinue the medication and contact your physician.  You should not drive, make important decisions, or operate machinery when taking narcotic pain medication.  SUNBLOCK Use sun block to incision area over the next year if this area will be exposed to sun. This helps decrease scarring and will allow you avoid a permanent darkened area over your incision.

## 2020-08-01 NOTE — Progress Notes (Signed)
[facility] POST-OP OFFICE VISIT  08/01/2020  HPI: LORELEE Gilmore is a 54 y.o. female 17 days s/p robotic cholecystectomy.  For acute on chronic cholecystitis with cholelithiasis.  She denies any history of postoperative chest pain.  Denies shortness of breath.  She reports some nausea and weight loss.  She also reports increasing looseness of bowels.  She seems to be quite pleased with the progress she is made.  Vital signs: BP (!) 156/74   Pulse 90   Temp 98.5 F (36.9 C) (Oral)   Ht 5\' 1"  (1.549 m)   Wt 198 lb 3.2 oz (89.9 kg)   SpO2 92%   BMI 37.45 kg/m    Physical Exam: Constitutional: She appears well. Abdomen: Soft, benign, nontender. Skin: Right lower quadrant incision has a slight gap, has a clean crust within it.  There is no surrounding erythema or mass-effect.  All the other incisions are clean, dry and intact.  With no underlying masses nor induration.  Assessment/Plan: This is a 53 y.o. female 17 days s/p robotic cholecystectomy for acute on chronic cholecystitis.  Patient Active Problem List   Diagnosis Date Noted  . Cholecystitis 07/16/2020  . Acute cholecystitis due to biliary calculus 07/15/2020  . Obesity, Class III, BMI 40-49.9 (morbid obesity) ([place]) 07/15/2020  . OSA on CPAP 05/03/2019  . Coronary artery disease due to calcified coronary lesion 10/23/2018  . 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 Encounter for chronic pain management 12/13/2015  . Chronic pain 12/13/2015  . Asthma 06/05/2015  . Rheumatoid arthritis ([place]) 06/05/2015  . 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 Allergic rhinitis 03/15/2015  . ADD (attention deficit disorder) 12/29/2014  . 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 Back ache 12/29/2014  . Bipolar disorder ([place]) 12/29/2014  . Carpal tunnel syndrome 12/29/2014  . Chest pain 12/29/2014  . Chronic anxiety 12/29/2014  . Elevated blood sugar 12/29/2014  . Elevated rheumatoid factor  12/29/2014  . Acid reflux 12/29/2014  . Infectious human wart virus 12/29/2014  . Benign neoplasm of colon 12/29/2014  . Cephalalgia 12/29/2014  . Cannot sleep 12/29/2014  . Awareness of heartbeats 12/29/2014  . LAD (lymphadenopathy), retroperitoneal 12/29/2014  . Fast heart beat 12/29/2014  . Avitaminosis D 12/29/2014  . Paroxysmal digital cyanosis 02/17/2014  . Ventricular bigeminy 01/14/2012  . Hypertension 01/14/2012  . Abnormal EKG 01/14/2012    -Follow-up as needed.  We reviewed her weight lifting restrictions.  I believe she understands that we have an open door for any future concerns.   Ronny Bacon M.D., FACS 08/01/2020, 10:00 AM

## 2020-08-22 NOTE — Progress Notes (Signed)
Subjective:   Susan Gilmore is a 54 y.o. female who presents for Medicare Annual (Subsequent) preventive examination.  I connected with Susan Gilmore today by telephone and verified that I am speaking with the correct person using two identifiers. Location patient: home Location provider: work Persons participating in the virtual visit: patient, provider.   I discussed the limitations, risks, security and privacy concerns of performing an evaluation and management service by telephone and the availability of in person appointments. I also discussed with the patient that there may be a patient responsible charge related to this service. The patient expressed understanding and verbally consented to this telephonic visit.    Interactive audio and video telecommunications were attempted between this provider and patient, however failed, due to patient having technical difficulties OR patient did not have access to video capability.  We continued and completed visit with audio only.   Review of Systems    N/A  Cardiac Risk Factors include: hypertension     Objective:    Today's Vitals   08/29/20 1133  PainSc: 8    There is no height or weight on file to calculate BMI.  Advanced Directives 08/29/2020 07/15/2020 07/15/2020 08/24/2019 03/16/2019 07/28/2018 05/25/2018  Does Patient Have a Medical Advance Directive? No No No No No No No  Would patient like information on creating a medical advance directive? No - Patient declined No - Patient declined - No - Patient declined No - Patient declined Yes (MAU/Ambulatory/Procedural Areas - Information given) No - Patient declined    Current Medications (verified) Outpatient Encounter Medications as of 08/29/2020  Medication Sig  . albuterol (PROAIR HFA) 108 (90 Base) MCG/ACT inhaler Inhale 1-2 puffs into the lungs every 6 (six) hours as needed for wheezing or shortness of breath.  . ALPRAZolam (XANAX) 1 MG tablet Take 1 mg by mouth 4 (four) times  daily as needed for anxiety.   [name] amLODipine (NORVASC) 5 MG tablet TAKE 1 TABLET BY MOUTH EVERY DAY  . amphetamine-dextroamphetamine (ADDERALL XR) 30 MG 24 hr capsule TAKE 2 CAPSULES BY MOUTH IN THE MORNING  . aspirin EC 81 MG tablet Take 1 tablet (81 mg total) by mouth daily.  [name] atorvastatin (LIPITOR) 40 MG tablet TAKE 1 TABLET BY MOUTH EVERY DAY  . DULoxetine (CYMBALTA) 60 MG capsule Take 120 mg by mouth daily.   . fluticasone (FLONASE) 50 MCG/ACT nasal spray Place 2 sprays into both nostrils daily. (Patient taking differently: Place 2 sprays into both nostrils 2 (two) times daily. )  . Fluticasone-Salmeterol (ADVAIR) 250-50 MCG/DOSE AEPB Inhale into the lungs.  . hydrochlorothiazide (HYDRODIURIL) 25 MG tablet TAKE 1 TABLET BY MOUTH EVERY DAY  . ibuprofen (ADVIL) 800 MG tablet Take 1 tablet (800 mg total) by mouth every 8 (eight) hours as needed.  [name] ipratropium-albuterol (DUONEB) 0.5-2.5 (3) MG/3ML SOLN TAKE 3 MLS BY NEBULIZATION EVERY 6 (SIX) HOURS AS NEEDED (SHORTNESS OF BREATH).  [name] leflunomide (ARAVA) 20 MG tablet Take 20 mg by mouth daily.  [name] loratadine (CLARITIN) 10 MG tablet Take 1 tablet by mouth daily as needed for allergies.   [name] losartan (COZAAR) 100 MG tablet TAKE 1 TABLET BY MOUTH EVERY DAY  . montelukast (SINGULAIR) 10 MG tablet Take 1 tablet by mouth daily as needed.   [name] omeprazole (PRILOSEC) 20 MG capsule Take 1 capsule (20 mg total) by mouth daily. (Patient taking differently: Take 20 mg by mouth daily. As needed)  . potassium citrate (UROCIT-K) 10 MEQ (1080 MG) SR tablet Take  1 tablet (10 mEq total) by mouth daily.  . predniSONE (DELTASONE) 5 MG tablet Take 5 mg by mouth daily with breakfast.   . zolpidem (AMBIEN) 10 MG tablet Take 10 mg by mouth at bedtime.   . [DISCONTINUED] atorvastatin (LIPITOR) 40 MG tablet TAKE 1 TABLET BY MOUTH EVERY DAY   No facility-administered encounter medications on file as of 08/29/2020.    Allergies (verified) Effexor [venlafaxine hcl] and  Isosorbide nitrate   History: Past Medical History:  Diagnosis Date  . Abnormal Pap smear 04/28/2012   normal pap /positive hpv   . Abnormal stress test   . Allergic rhinitis   . Bipolar disorder ([place])   . Carpal tunnel syndrome   . Chest pain   . Chronic anxiety   . Contraception   . Cough   . HPV (human papilloma virus) anogenital infection   . HTN (hypertension)   . Hyperplastic colon polyp   . Insomnia   . Palpitations   . Pneumonia    Past Surgical History:  Procedure Laterality Date  . CHOLECYSTECTOMY    . full mouth dental    . GANGLION CYST EXCISION    . WISDOM TOOTH EXTRACTION     Family History  Problem Relation Age of Onset  . Stroke Paternal Grandmother   . Heart disease Paternal Grandmother   . Diabetes Maternal Grandmother   . Heart disease Maternal Grandmother   . Hypertension Maternal Grandmother   . Heart disease Maternal Grandfather   . Hypertension Maternal Grandfather   . Heart disease Father   . Stroke Father   . Hypertension Father   . Hyperlipidemia Father   . Diabetes Mother   . Heart attack Mother    Social History   Socioeconomic History  . Marital status: Married    Spouse name: Not on file  . Number of children: 1  . Years of education: Not on file  . Highest education level: 10th grade  Occupational History  . Occupation: disability  Tobacco Use  . Smoking status: Former Smoker    Packs/day: 1.00    Years: 26.00    Pack years: 26.00    Types: Cigarettes    Quit date: 07/26/2013    Years since quitting: 7.0  . Smokeless tobacco: Never Used  Vaping Use  . Vaping Use: Some days  Substance and Sexual Activity  . Alcohol use: No    Alcohol/week: 0.0 standard drinks    Comment: Had a history of alcohol use but currently is not drinking any  . Drug use: No  . Sexual activity: Yes    Birth control/protection: Injection    Comment: depo  Other Topics Concern  . Not on file  Social History Narrative   The patient is married.  She has one stepson. She does not work outside the home.    Social Determinants of Health   Financial Resource Strain: Low Risk   . Difficulty of Paying Living Expenses: Not hard at all  Food Insecurity: No Food Insecurity  . Worried About Charity fundraiser in the Last Year: Never true  . Ran Out of Food in the Last Year: Never true  Transportation Needs: No Transportation Needs  . Lack of Transportation (Medical): No  . Lack of Transportation (Non-Medical): No  Physical Activity: Inactive  . Days of Exercise per Week: 0 days  . Minutes of Exercise per Session: 0 min  Stress: Stress Concern Present  . Feeling of Stress : Rather much  Social Connections: Moderately Isolated  . Frequency of Communication with Friends and Family: More than three times a week  . Frequency of Social Gatherings with Friends and Family: More than three times a week  . Attends Religious Services: Never  . Active Member of Clubs or Organizations: No  . Attends Archivist Meetings: Never  . Marital Status: Married    Tobacco Counseling Counseling given: Not Answered   Clinical Intake:  Pre-visit preparation completed: Yes  Pain : 0-10 Pain Score: 8  Pain Type: Chronic pain Pain Location: Back Pain Orientation: Lower Pain Descriptors / Indicators: Aching Pain Frequency: Constant Pain Relieving Factors: Takes Ibuprofen as needed for pain.  Pain Relieving Factors: Takes Ibuprofen as needed for pain.  Nutritional Risks: None Diabetes: No  How often do you need to have someone help you when you read instructions, pamphlets, or other written materials from your doctor or pharmacy?: 1 - Never  Diabetic? No, borderline  Interpreter Needed?: No  Information entered by :: [facility], LPN   Activities of Daily Living In your present state of health, do you have any difficulty performing the following activities: 08/29/2020 07/15/2020  Hearing? N N  Vision? Y N  Comment Needs a new eye  glass prescription. -  Difficulty concentrating or making decisions? N N  Walking or climbing stairs? Y N  Comment Due to RA and back pains. -  Dressing or bathing? N N  Doing errands, shopping? N N  Preparing Food and eating ? N -  Using the Toilet? N -  In the past six months, have you accidently leaked urine? N -  Do you have problems with loss of bowel control? N -  Managing your Medications? N -  Housekeeping or managing your Housekeeping? N -  Some recent data might be hidden    Patient Care Team: Rubye Beach as PCP - General (Physician Assistant) [name], MD as PCP - Cardiology (Cardiology) Chucky May, MD as Consulting Physician (Psychiatry) Quintin Alto, MD as Consulting Physician (Rheumatology)  Indicate any recent Medical Services you may have received from other than [facility] providers in the past year (date may be approximate).     Assessment:   This is a routine wellness examination for Susan Gilmore.  Hearing/Vision screen No exam data present  Dietary issues and exercise activities discussed: Current Exercise Habits: The patient does not participate in regular exercise at present, Exercise limited by: orthopedic condition(s)  Goals    . DIET - DECREASE SODA OR JUICE INTAKE     Recommend decreasing amount of soda by half and not exceeding 3 cans a day.     . Increase water intake     Recommend increasing water intake to 4-6 glasses a day.       Depression Screen PHQ 2/9 Scores 08/29/2020 08/24/2019 08/24/2019 02/16/2018 02/13/2017 03/06/2016  PHQ - 2 Score 0 0 0 0 4 2  PHQ- 9 Score - - - - 8 10    Fall Risk Fall Risk  08/29/2020 08/01/2020 08/24/2019 02/16/2018 06/12/2017  Falls in the past year? 0 0 0 Yes Yes  Number falls in past yr: 0 - 0 1 1  Injury with Fall? 0 - 0 Yes Yes  Comment - - - sprained ankle -  Follow up - - - Falls prevention discussed -    FALL RISK PREVENTION PERTAINING TO THE HOME:  Any stairs in or around the  home? Yes  If so, are there any without  handrails? No  Home free of loose throw rugs in walkways, pet beds, electrical cords, etc? Yes  Adequate lighting in your home to reduce risk of falls? Yes   ASSISTIVE DEVICES UTILIZED TO PREVENT FALLS:  Use of a cane, walker or w/c? Yes    Cognitive Function: Normal cognitive status assessed by observation by this Nurse Health Advisor. No abnormalities found.       6CIT Screen 02/13/2017  What Year? 0 points  What month? 0 points  What time? 0 points  Count back from 20 0 points  Months in reverse 0 points  Repeat phrase 10 points  Total Score 10    Immunizations Immunization History  Administered Date(s) Administered  . Influenza, Seasonal, Injecte, Preservative Fre 06/04/2017  . Influenza,inj,Quad PF,6+ Mos 06/05/2015, 07/19/2019, 05/05/2020  . Influenza-Unspecified 06/01/2014, 06/01/2018  . Pneumococcal Conjugate-13 06/05/2015  . Pneumococcal Polysaccharide-23 07/17/2012  . Td 07/12/2004  . Tdap 09/28/2010    TDAP status: Up to date  Flu Vaccine status: Up to date  Covid-19 vaccine status: Declined, Education has been provided regarding the importance of this vaccine but patient still declined. Advised may receive this vaccine at local pharmacy or Health Dept.or vaccine clinic. Aware to provide a copy of the vaccination record if obtained from local pharmacy or Health Dept. Verbalized acceptance and understanding.  Qualifies for Shingles Vaccine? Yes   Zostavax completed No   Shingrix Completed?: No.    Education has been provided regarding the importance of this vaccine. Patient has been advised to call insurance company to determine out of pocket expense if they have not yet received this vaccine. Advised may also receive vaccine at local pharmacy or Health Dept. Verbalized acceptance and understanding.  Screening Tests Health Maintenance  Topic Date Due  . COVID-19 Vaccine (1) Never done  . COLONOSCOPY (Pts 45-65yr  Insurance coverage will need to be confirmed)  Never done  . MAMMOGRAM  Never done  . PAP SMEAR-Modifier  03/05/2018  . TETANUS/TDAP  09/28/2020  . INFLUENZA VACCINE  Completed  . HIV Screening  Completed    Health Maintenance  Health Maintenance Due  Topic Date Due  . COVID-19 Vaccine (1) Never done  . COLONOSCOPY (Pts 45-411yrInsurance coverage will need to be confirmed)  Never done  . MAMMOGRAM  Never done  . PAP SMEAR-Modifier  03/05/2018    Colorectal cancer screening: Currently due, declined colonoscopy referral. Ordered placed for cologuard kit.  Mammogram status: Currently due, declined order at this time.    Lung Cancer Screening: (Low Dose CT Chest recommended if Age 52-80ears, 30 pack-year currently smoking OR have quit w/in 15years.) does not qualify.   Additional Screening:  Vision Screening: Recommended annual ophthalmology exams for early detection of glaucoma and other disorders of the eye. Is the patient up to date with their annual eye exam?  No Who is the provider or what is the name of the office in which the patient attends annual eye exams? Pt plans to call insurance company to find and ophthalmologist that is covered under her plan.  If pt is not established with a provider, would they like to be referred to a provider to establish care? No .   Dental Screening: Recommended annual dental exams for proper oral hygiene  Community Resource Referral / Chronic Care Management: CRR required this visit?  No   CCM required this visit?  No      Plan:     I have personally reviewed and noted the  following in the patient's chart:   . Medical and social history . Use of alcohol, tobacco or illicit drugs  . Current medications and supplements . Functional ability and status . Nutritional status . Physical activity . Advanced directives . List of other physicians . Hospitalizations, surgeries, and ER visits in previous 12  months . Vitals . Screenings to include cognitive, depression, and falls . Referrals and appointments  In addition, I have reviewed and discussed with patient certain preventive protocols, quality metrics, and best practice recommendations. A written personalized care plan for preventive services as well as general preventive health recommendations were provided to patient.     Susan Gilmore, [place]   2/0/7218   Nurse Notes: Pt declined receiving a future Covid vaccine, a colonoscopy referral or a mammogram order at this time. Pt is due for a PAP smear.

## 2020-08-25 ENCOUNTER — Other Ambulatory Visit: Payer: Self-pay | Admitting: Cardiology

## 2020-08-25 DIAGNOSIS — I2584 Coronary atherosclerosis due to calcified coronary lesion: Secondary | ICD-10-CM

## 2020-08-25 DIAGNOSIS — I2510 Atherosclerotic heart disease of native coronary artery without angina pectoris: Secondary | ICD-10-CM

## 2020-08-28 ENCOUNTER — Telehealth: Payer: Self-pay | Admitting: Physician Assistant

## 2020-08-28 NOTE — Telephone Encounter (Signed)
Copied from CRM [id number]. Topic: Medicare AWV >> Aug 28, 2020  3:12 PM Geraldine Contras wrote: Reason for CRM:  08/28/20 - Left message to let patient know AWVS visit will not be in the office. Will be by phone.

## 2020-08-29 ENCOUNTER — Ambulatory Visit (INDEPENDENT_AMBULATORY_CARE_PROVIDER_SITE_OTHER): Payer: Medicare Other

## 2020-08-29 ENCOUNTER — Other Ambulatory Visit: Payer: Self-pay

## 2020-08-29 ENCOUNTER — Other Ambulatory Visit: Payer: Self-pay | Admitting: Surgery

## 2020-08-29 DIAGNOSIS — Z0000 Encounter for general adult medical examination without abnormal findings: Secondary | ICD-10-CM | POA: Diagnosis not present

## 2020-08-29 DIAGNOSIS — Z1211 Encounter for screening for malignant neoplasm of colon: Secondary | ICD-10-CM

## 2020-08-29 NOTE — Patient Instructions (Signed)
Ms. Hostler , Thank you for taking time to come for your Medicare Wellness Visit. I appreciate your ongoing commitment to your health goals. Please review the following plan we discussed and let me know if I can assist you in the future.   Screening recommendations/referrals: Colonoscopy: Currently due. Cologuard ordered today. Pt aware of instructions. Mammogram: Currently due, declined order at this time. Recommended yearly ophthalmology/optometry visit for glaucoma screening and checkup Recommended yearly dental visit for hygiene and checkup  Vaccinations: Influenza vaccine: Done 07/15/20 Tdap vaccine: Up to date, due 09/2020 Shingles vaccine: Shingrix discussed. Please contact your pharmacy for coverage information.     Advanced directives: Advance directive discussed with you today. Even though you declined this today please call our office should you change your mind and we can give you the proper paperwork for you to fill out.  Conditions/risks identified: Recommend to increase water intake to 6-8 8 oz glasses a day.   Next appointment: 09/03/21 @ 9:00 AM for an AWV call. Declined scheduling a follow up with PCP at this time.    Preventive Care 53 Years and Older, Female Preventive care refers to lifestyle choices and visits with your health care provider that can promote health and wellness. What does preventive care include?  A yearly physical exam. This is also called an annual well check.  Dental exams once or twice a year.  Routine eye exams. Ask your health care provider how often you should have your eyes checked.  Personal lifestyle choices, including:  Daily care of your teeth and gums.  Regular physical activity.  Eating a healthy diet.  Avoiding tobacco and drug use.  Limiting alcohol use.  Practicing safe sex.  Taking low-dose aspirin every day.  Taking vitamin and mineral supplements as recommended by your health care provider. What happens during an  annual well check? The services and screenings done by your health care provider during your annual well check will depend on your age, overall health, lifestyle risk factors, and family history of disease. Counseling  Your health care provider may ask you questions about your:  Alcohol use.  Tobacco use.  Drug use.  Emotional well-being.  Home and relationship well-being.  Sexual activity.  Eating habits.  History of falls.  Memory and ability to understand (cognition).  Work and work Astronomer.  Reproductive health. Screening  You may have the following tests or measurements:  Height, weight, and BMI.  Blood pressure.  Lipid and cholesterol levels. These may be checked every 5 years, or more frequently if you are over 40 years old.  Skin check.  Lung cancer screening. You may have this screening every year starting at age 53 if you have a 30-pack-year history of smoking and currently smoke or have quit within the past 15 years.  Fecal occult blood test (FOBT) of the stool. You may have this test every year starting at age 53.  Flexible sigmoidoscopy or colonoscopy. You may have a sigmoidoscopy every 5 years or a colonoscopy every 10 years starting at age 53.  Hepatitis C blood test.  Hepatitis B blood test.  Sexually transmitted disease (STD) testing.  Diabetes screening. This is done by checking your blood sugar (glucose) after you have not eaten for a while (fasting). You may have this done every 1-3 years.  Bone density scan. This is done to screen for osteoporosis. You may have this done starting at age 53.  Mammogram. This may be done every 1-2 years. Talk to your health  care provider about how often you should have regular mammograms. Talk with your health care provider about your test results, treatment options, and if necessary, the need for more tests. Vaccines  Your health care provider may recommend certain vaccines, such as:  Influenza  vaccine. This is recommended every year.  Tetanus, diphtheria, and acellular pertussis (Tdap, Td) vaccine. You may need a Td booster every 10 years.  Zoster vaccine. You may need this after age 60.  Pneumococcal 13-valent conjugate (PCV13) vaccine. One dose is recommended after age 61.  Pneumococcal polysaccharide (PPSV23) vaccine. One dose is recommended after age 15. Talk to your health care provider about which screenings and vaccines you need and how often you need them. This information is not intended to replace advice given to you by your health care provider. Make sure you discuss any questions you have with your health care provider. Document Released: 09/08/2015 Document Revised: 05/01/2016 Document Reviewed: 06/13/2015 Elsevier Interactive Patient Education  2017 [place] Prevention in the Home Falls can cause injuries. They can happen to people of all ages. There are many things you can do to make your home safe and to help prevent falls. What can I do on the outside of my home?  Regularly fix the edges of walkways and driveways and fix any cracks.  Remove anything that might make you trip as you walk through a door, such as a raised step or threshold.  Trim any bushes or trees on the path to your home.  Use bright outdoor lighting.  Clear any walking paths of anything that might make someone trip, such as rocks or tools.  Regularly check to see if handrails are loose or broken. Make sure that both sides of any steps have handrails.  Any raised decks and porches should have guardrails on the edges.  Have any leaves, snow, or ice cleared regularly.  Use sand or salt on walking paths during winter.  Clean up any spills in your garage right away. This includes oil or grease spills. What can I do in the bathroom?  Use night lights.  Install grab bars by the toilet and in the tub and shower. Do not use towel bars as grab bars.  Use non-skid mats or decals  in the tub or shower.  If you need to sit down in the shower, use a plastic, non-slip stool.  Keep the floor dry. Clean up any water that spills on the floor as soon as it happens.  Remove soap buildup in the tub or shower regularly.  Attach bath mats securely with double-sided non-slip rug tape.  Do not have throw rugs and other things on the floor that can make you trip. What can I do in the bedroom?  Use night lights.  Make sure that you have a light by your bed that is easy to reach.  Do not use any sheets or blankets that are too big for your bed. They should not hang down onto the floor.  Have a firm chair that has side arms. You can use this for support while you get dressed.  Do not have throw rugs and other things on the floor that can make you trip. What can I do in the kitchen?  Clean up any spills right away.  Avoid walking on wet floors.  Keep items that you use a lot in easy-to-reach places.  If you need to reach something above you, use a strong step stool that has a grab  bar.  Keep electrical cords out of the way.  Do not use floor polish or wax that makes floors slippery. If you must use wax, use non-skid floor wax.  Do not have throw rugs and other things on the floor that can make you trip. What can I do with my stairs?  Do not leave any items on the stairs.  Make sure that there are handrails on both sides of the stairs and use them. Fix handrails that are broken or loose. Make sure that handrails are as long as the stairways.  Check any carpeting to make sure that it is firmly attached to the stairs. Fix any carpet that is loose or worn.  Avoid having throw rugs at the top or bottom of the stairs. If you do have throw rugs, attach them to the floor with carpet tape.  Make sure that you have a light switch at the top of the stairs and the bottom of the stairs. If you do not have them, ask someone to add them for you. What else can I do to help  prevent falls?  Wear shoes that:  Do not have high heels.  Have rubber bottoms.  Are comfortable and fit you well.  Are closed at the toe. Do not wear sandals.  If you use a stepladder:  Make sure that it is fully opened. Do not climb a closed stepladder.  Make sure that both sides of the stepladder are locked into place.  Ask someone to hold it for you, if possible.  Clearly mark and make sure that you can see:  Any grab bars or handrails.  First and last steps.  Where the edge of each step is.  Use tools that help you move around (mobility aids) if they are needed. These include:  Canes.  Walkers.  Scooters.  Crutches.  Turn on the lights when you go into a dark area. Replace any light bulbs as soon as they burn out.  Set up your furniture so you have a clear path. Avoid moving your furniture around.  If any of your floors are uneven, fix them.  If there are any pets around you, be aware of where they are.  Review your medicines with your doctor. Some medicines can make you feel dizzy. This can increase your chance of falling. Ask your doctor what other things that you can do to help prevent falls. This information is not intended to replace advice given to you by your health care provider. Make sure you discuss any questions you have with your health care provider. Document Released: 06/08/2009 Document Revised: 01/18/2016 Document Reviewed: 09/16/2014 Elsevier Interactive Patient Education  2017 [employer].

## 2020-09-08 ENCOUNTER — Ambulatory Visit (INDEPENDENT_AMBULATORY_CARE_PROVIDER_SITE_OTHER): Payer: Medicare Other | Admitting: Physician Assistant

## 2020-09-08 ENCOUNTER — Encounter: Payer: Self-pay | Admitting: Physician Assistant

## 2020-09-08 ENCOUNTER — Other Ambulatory Visit: Payer: Self-pay

## 2020-09-08 VITALS — BP 132/81 | HR 86 | Temp 98.4°F | Wt 194.0 lb

## 2020-09-08 DIAGNOSIS — Z7952 Long term (current) use of systemic steroids: Secondary | ICD-10-CM

## 2020-09-08 DIAGNOSIS — T380X5A Adverse effect of glucocorticoids and synthetic analogues, initial encounter: Secondary | ICD-10-CM

## 2020-09-08 DIAGNOSIS — E559 Vitamin D deficiency, unspecified: Secondary | ICD-10-CM | POA: Diagnosis not present

## 2020-09-08 DIAGNOSIS — Z23 Encounter for immunization: Secondary | ICD-10-CM

## 2020-09-08 DIAGNOSIS — D84821 Immunodeficiency due to drugs: Secondary | ICD-10-CM | POA: Diagnosis not present

## 2020-09-08 DIAGNOSIS — F3176 Bipolar disorder, in full remission, most recent episode depressed: Secondary | ICD-10-CM

## 2020-09-08 DIAGNOSIS — J449 Chronic obstructive pulmonary disease, unspecified: Secondary | ICD-10-CM | POA: Insufficient documentation

## 2020-09-08 DIAGNOSIS — I10 Essential (primary) hypertension: Secondary | ICD-10-CM | POA: Diagnosis not present

## 2020-09-08 DIAGNOSIS — G4733 Obstructive sleep apnea (adult) (pediatric): Secondary | ICD-10-CM

## 2020-09-08 DIAGNOSIS — J441 Chronic obstructive pulmonary disease with (acute) exacerbation: Secondary | ICD-10-CM | POA: Insufficient documentation

## 2020-09-08 DIAGNOSIS — Z9989 Dependence on other enabling machines and devices: Secondary | ICD-10-CM

## 2020-09-08 DIAGNOSIS — M05711 Rheumatoid arthritis with rheumatoid factor of right shoulder without organ or systems involvement: Secondary | ICD-10-CM

## 2020-09-08 DIAGNOSIS — J418 Mixed simple and mucopurulent chronic bronchitis: Secondary | ICD-10-CM

## 2020-09-08 DIAGNOSIS — R739 Hyperglycemia, unspecified: Secondary | ICD-10-CM

## 2020-09-08 MED ORDER — BUDESONIDE-FORMOTEROL FUMARATE 160-4.5 MCG/ACT IN AERO: 2.0000 | INHALATION_SPRAY | Freq: Two times a day (BID) | RESPIRATORY_TRACT | 12 refills | Status: DC

## 2020-09-08 NOTE — Progress Notes (Signed)
Established patient visit   Patient: Susan Gilmore   DOB: 1966/03/12   55 y.o. Female  MRN: [id number] Visit Date: 09/08/2020  Today's healthcare provider: [name], PA-C   No chief complaint on file.  Subjective    HPI  Hypertension, follow-up  BP Readings from Last 3 Encounters:  09/08/20 132/81  08/01/20 (!) 156/74  07/16/20 (!) 166/86   Wt Readings from Last 3 Encounters:  09/08/20 194 lb (88 kg)  08/01/20 198 lb 3.2 oz (89.9 kg)  07/15/20 207 lb (93.9 kg)     She was last seen for hypertension 10 months ago.   She reports excellent compliance with treatment. She is not having side effects.  She is following a Low Sodium diet. She is exercising. She does not smoke.  Use of agents associated with hypertension: none.   Outside blood pressures are not being checked. Symptoms: No chest pain No chest pressure  No palpitations No syncope  No dyspnea No orthopnea  No paroxysmal nocturnal dyspnea No lower extremity edema   Pertinent labs: Lab Results  Component Value Date   CHOL 84 (L) 10/23/2018   HDL 31 (L) 10/23/2018   LDLCALC 38 10/23/2018   TRIG 74 10/23/2018   CHOLHDL 2.7 10/23/2018   Lab Results  Component Value Date   NA 138 07/16/2020   K 3.8 07/16/2020   CREATININE 0.72 07/16/2020   GFRNONAA >60 07/16/2020   GFRAA 98 11/17/2019   GLUCOSE 147 (H) 07/16/2020     The ASCVD Risk score (Goff DC Jr., et al., 2013) failed to calculate for the following reasons:   The valid total cholesterol range is 130 to 320 mg/dL    Lipid/Cholesterol, Follow-up  Last lipid panel Other pertinent labs  Lab Results  Component Value Date   CHOL 84 (L) 10/23/2018   HDL 31 (L) 10/23/2018   LDLCALC 38 10/23/2018   TRIG 74 10/23/2018   CHOLHDL 2.7 10/23/2018   Lab Results  Component Value Date   ALT 60 (H) 07/16/2020   AST 84 (H) 07/16/2020   PLT 198 07/16/2020   TSH 2.620 02/18/2018     She was last seen for this 10 months ago.   Management since that visit includes no changes.  She reports excellent compliance with treatment. She is not having side effects.   Symptoms: No chest pain No chest pressure/discomfort  No dyspnea No lower extremity edema  No numbness or tingling of extremity No orthopnea  No palpitations No paroxysmal nocturnal dyspnea  No speech difficulty No syncope   Current diet: in general, a "healthy" diet     The ASCVD Risk score Mikey Bussing DC Jr., et al., 2013) failed to calculate for the following reasons:   The valid total cholesterol range is 130 to 320 mg/dL  ---------------------------------------------------------------------------------------------------  ---------------------------------------------------------------------------------------------------   Patient Active Problem List   Diagnosis Date Noted  . Status post laparoscopic cholecystectomy 08/01/2020  . Obesity, Class III, BMI 40-49.9 (morbid obesity) ([place]) 07/15/2020  . OSA on CPAP 05/03/2019  . Coronary artery disease due to calcified coronary lesion 10/23/2018  . 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 Encounter for chronic pain management 12/13/2015  . Chronic pain 12/13/2015  . Asthma 06/05/2015  . Rheumatoid arthritis ([place]) 06/05/2015  . 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 Allergic rhinitis 03/15/2015  . ADD (attention deficit disorder) 12/29/2014  . 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 Back ache 12/29/2014  . Bipolar disorder ([place])  12/29/2014  . Carpal tunnel syndrome 12/29/2014  . Chest pain 12/29/2014  . Chronic anxiety 12/29/2014  . Elevated blood sugar 12/29/2014  . Elevated rheumatoid factor 12/29/2014  . Acid reflux 12/29/2014  . Infectious human wart virus 12/29/2014  . Benign neoplasm of colon 12/29/2014  . Cephalalgia 12/29/2014  . Cannot sleep 12/29/2014  . Awareness of heartbeats 12/29/2014  . LAD (lymphadenopathy), retroperitoneal 12/29/2014  . Fast heart  beat 12/29/2014  . Avitaminosis D 12/29/2014  . Paroxysmal digital cyanosis 02/17/2014  . Ventricular bigeminy 01/14/2012  . Hypertension 01/14/2012  . Abnormal EKG 01/14/2012   Past Medical History:  Diagnosis Date  . Abnormal Pap smear 04/28/2012   normal pap /positive hpv   . Abnormal stress test   . Allergic rhinitis   . Bipolar disorder ([place])   . Carpal tunnel syndrome   . Chest pain   . Chronic anxiety   . Contraception   . Cough   . HPV (human papilloma virus) anogenital infection   . HTN (hypertension)   . Hyperplastic colon polyp   . Insomnia   . Palpitations   . Pneumonia    Social History   Tobacco Use  . Smoking status: Former Smoker    Packs/day: 1.00    Years: 26.00    Pack years: 26.00    Types: Cigarettes    Quit date: 07/26/2013    Years since quitting: 7.1  . Smokeless tobacco: Never Used  Vaping Use  . Vaping Use: Some days  Substance Use Topics  . Alcohol use: No    Alcohol/week: 0.0 standard drinks    Comment: Had a history of alcohol use but currently is not drinking any  . Drug use: No   Allergies  Allergen Reactions  . Effexor [Venlafaxine Hcl] Swelling  . Isosorbide Nitrate Other (See Comments)    Reaction: Bad headache     Medications: Outpatient Medications Prior to Visit  Medication Sig  . albuterol (PROAIR HFA) 108 (90 Base) MCG/ACT inhaler Inhale 1-2 puffs into the lungs every 6 (six) hours as needed for wheezing or shortness of breath.  . ALPRAZolam (XANAX) 1 MG tablet Take 1 mg by mouth 4 (four) times daily as needed for anxiety.   [name] amLODipine (NORVASC) 5 MG tablet TAKE 1 TABLET BY MOUTH EVERY DAY  . amphetamine-dextroamphetamine (ADDERALL XR) 30 MG 24 hr capsule TAKE 2 CAPSULES BY MOUTH IN THE MORNING  . aspirin EC 81 MG tablet Take 1 tablet (81 mg total) by mouth daily.  [name] atorvastatin (LIPITOR) 40 MG tablet TAKE 1 TABLET BY MOUTH EVERY DAY  . DULoxetine (CYMBALTA) 60 MG capsule Take 120 mg by mouth daily.   . fluticasone  (FLONASE) 50 MCG/ACT nasal spray Place 2 sprays into both nostrils daily. (Patient taking differently: Place 2 sprays into both nostrils 2 (two) times daily.)  . hydrochlorothiazide (HYDRODIURIL) 25 MG tablet TAKE 1 TABLET BY MOUTH EVERY DAY  . ibuprofen (ADVIL) 800 MG tablet TAKE 1 TABLET(800 MG) BY MOUTH EVERY 8 HOURS AS NEEDED  . ipratropium-albuterol (DUONEB) 0.5-2.5 (3) MG/3ML SOLN TAKE 3 MLS BY NEBULIZATION EVERY 6 (SIX) HOURS AS NEEDED (SHORTNESS OF BREATH).  [name] leflunomide (ARAVA) 20 MG tablet Take 20 mg by mouth daily.  [name] loratadine (CLARITIN) 10 MG tablet Take 1 tablet by mouth daily as needed for allergies.   [name] losartan (COZAAR) 100 MG tablet TAKE 1 TABLET BY MOUTH EVERY DAY  . montelukast (SINGULAIR) 10 MG tablet Take 1 tablet by mouth daily as  needed.   [name] omeprazole (PRILOSEC) 20 MG capsule Take 1 capsule (20 mg total) by mouth daily. (Patient taking differently: Take 20 mg by mouth daily. As needed)  . predniSONE (DELTASONE) 5 MG tablet Take 5 mg by mouth daily with breakfast.   . zolpidem (AMBIEN) 10 MG tablet Take 10 mg by mouth at bedtime.   . Fluticasone-Salmeterol (ADVAIR) 250-50 MCG/DOSE AEPB Inhale into the lungs. (Patient not taking: Reported on 09/08/2020)  . potassium citrate (UROCIT-K) 10 MEQ (1080 MG) SR tablet Take 1 tablet (10 mEq total) by mouth daily.   No facility-administered medications prior to visit.    Review of Systems  Constitutional: Negative.   Respiratory: Negative.   Cardiovascular: Negative.   Gastrointestinal: Positive for diarrhea. Negative for abdominal distention, abdominal pain, anal bleeding, blood in stool, constipation, nausea, rectal pain and vomiting.  Neurological: Negative for dizziness, syncope, light-headedness and headaches.      Objective    BP 132/81 (BP Location: Left Arm, Patient Position: Sitting, Cuff Size: Large)   Pulse 86   Temp 98.4 F (36.9 C) (Oral)   Wt 194 lb (88 kg)   BMI 36.66 kg/m    Physical Exam Vitals  reviewed.  Constitutional:      General: She is not in acute distress.    Appearance: Normal appearance. She is well-developed and well-nourished. She is not ill-appearing or diaphoretic.  Cardiovascular:     Rate and Rhythm: Normal rate and regular rhythm.     Pulses: Normal pulses.     Heart sounds: Normal heart sounds. No murmur heard. No friction rub. No gallop.   Pulmonary:     Effort: Pulmonary effort is normal. No respiratory distress.     Breath sounds: Normal breath sounds. No wheezing or rales.  Musculoskeletal:     Cervical back: Normal range of motion and neck supple.  Neurological:     General: No focal deficit present.     Mental Status: She is alert. Mental status is at baseline.  Psychiatric:        Mood and Affect: Mood normal.        Thought Content: Thought content normal.      No results found for any visits on 09/08/20.  Assessment & Plan     1. Hypertension, unspecified type Stable. Continue Amlodipine 5mg , losartan 100mg . Will check labs as below and f/u pending results. - CBC w/Diff/Platelet - Comprehensive Metabolic Panel (CMET) - TSH - Lipid Panel With LDL/HDL Ratio - HgB A1c  2. OSA on CPAP Stable. Uses nightly with symptom improvement. Will check labs as below and f/u pending results. - CBC w/Diff/Platelet - Comprehensive Metabolic Panel (CMET) - TSH - Lipid Panel With LDL/HDL Ratio - HgB A1c  3. 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 Posey Pronto, Rheumatology.  - CBC w/Diff/Platelet - Comprehensive Metabolic Panel (CMET) - TSH - Lipid Panel With LDL/HDL Ratio - HgB A1c  4. 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 Will update Pneumococcal 23 today. Patient interested in getting Moderna. Discussed not offered here at this time, possibly in the near future. She could have done at any pharmacy.  - CBC w/Diff/Platelet - Comprehensive Metabolic Panel  (CMET) - TSH - Lipid Panel With LDL/HDL Ratio - HgB A1c  5. Elevated blood sugar Suspected due to chronic steroids. Will check labs as below and f/u pending results. - CBC w/Diff/Platelet - Comprehensive Metabolic Panel (CMET) - TSH - Lipid Panel With LDL/HDL Ratio -  HgB A1c  6. Avitaminosis D H/O this. Will check labs as below and f/u pending results. - CBC w/Diff/Platelet - Comprehensive Metabolic Panel (CMET) - TSH - Lipid Panel With LDL/HDL Ratio - HgB A1c - Vitamin D (25 hydroxy)  7. Mixed simple and mucopurulent chronic bronchitis (HCC) Prefers Symbicort over Advair. Has nebulizer medications for acute episodes. Will check labs as below and f/u pending results. - CBC w/Diff/Platelet - Comprehensive Metabolic Panel (CMET) - TSH - Lipid Panel With LDL/HDL Ratio - HgB A1c - budesonide-formoterol (SYMBICORT) 160-4.5 MCG/ACT inhaler; Inhale 2 puffs into the lungs in the morning and at bedtime.  Dispense: 1 each; Refill: 12  8. Bipolar disorder, in full remission, most recent episode depressed ([place]) Stable. Followed by Psychiatry. Will check labs as below and f/u pending results. - CBC w/Diff/Platelet - Comprehensive Metabolic Panel (CMET) - TSH - Lipid Panel With LDL/HDL Ratio - HgB A1c  9. Need for pneumococcal vaccination Pneumococcal 23 Vaccine given to patient without complications. Patient sat for 15 minutes after administration and was tolerated well without adverse effects. - Pneumococcal polysaccharide vaccine 23-valent greater than or equal to 2yo subcutaneous/IM   No follow-ups on file.      Reynolds Bowl, PA-C, have reviewed all documentation for this visit. The documentation on 09/08/20 for the exam, diagnosis, procedures, and orders are all accurate and complete.   Rubye Beach  [facility] [phone] (phone) [phone] (fax)  [place]

## 2020-09-08 NOTE — Patient Instructions (Signed)
Chronic Obstructive Pulmonary Disease  Chronic obstructive pulmonary disease (COPD) is a long-term (chronic) lung problem. When you have COPD, it is hard for air to get in and out of your lungs. Usually the condition gets worse over time, and your lungs will never return to normal. There are things you can do to keep yourself as healthy as possible. What are the causes?  Smoking. This is the most common cause.  Certain genes passed from parent to child (inherited). What increases the risk?  Being exposed to secondhand smoke from cigarettes, pipes, or cigars.  Being exposed to chemicals and other irritants, such as fumes and dust in the work environment.  Having chronic lung conditions or infections. What are the signs or symptoms?  Shortness of breath, especially during physical activity.  A long-term cough with a large amount of thick mucus. Sometimes, the cough may not have any mucus (dry cough).  Wheezing.  Breathing quickly.  Skin that looks gray or blue, especially in the fingers, toes, or lips.  Feeling tired (fatigue).  Weight loss.  Chest tightness.  Having infections often.  Episodes when breathing symptoms become much worse (exacerbations). At the later stages of this disease, you may have swelling in the ankles, feet, or legs. How is this treated?  Taking medicines.  Quitting smoking, if you smoke.  Rehabilitation. This includes steps to make your body work better. It may involve a team of specialists.  Doing exercises.  Making changes to your diet.  Using oxygen.  Lung surgery.  Lung transplant.  Comfort measures (palliative care). Follow these instructions at home: Medicines  Take over-the-counter and prescription medicines only as told by your doctor.  Talk to your doctor before taking any cough or allergy medicines. You may need to avoid medicines that cause your lungs to be dry. Lifestyle  If you smoke, stop smoking. Smoking makes the  problem worse.  Do not smoke or use any products that contain nicotine or tobacco. If you need help quitting, ask your doctor.  Avoid being around things that make your breathing worse. This may include smoke, chemicals, and fumes.  Stay active, but remember to rest as well.  Learn and use tips on how to manage stress and control your breathing.  Make sure you get enough sleep. Most adults need at least 7 hours of sleep every night.  Eat healthy foods. Eat smaller meals more often. Rest before meals. Controlled breathing Learn and use tips on how to control your breathing as told by your doctor. Try:  Breathing in (inhaling) through your nose for 1 second. Then, pucker your lips and breath out (exhale) through your lips for 2 seconds.  Putting one hand on your belly (abdomen). Breathe in slowly through your nose for 1 second. Your hand on your belly should move out. Pucker your lips and breathe out slowly through your lips. Your hand on your belly should move in as you breathe out.   Controlled coughing Learn and use controlled coughing to clear mucus from your lungs. Follow these steps: 1. Lean your head a little forward. 2. Breathe in deeply. 3. Try to hold your breath for 3 seconds. 4. Keep your mouth slightly open while coughing 2 times. 5. Spit any mucus out into a tissue. 6. Rest and do the steps again 1 or 2 times as needed. General instructions  Make sure you get all the shots (vaccines) that your doctor recommends. Ask your doctor about a flu shot and a pneumonia shot.    Use oxygen therapy and pulmonary rehabilitation if told by your doctor. If you need home oxygen therapy, ask your doctor if you should buy a tool to measure your oxygen level (oximeter).  Make a COPD action plan with your doctor. This helps you to know what to do if you feel worse than usual.  Manage any other conditions you have as told by your doctor.  Avoid going outside when it is very hot, cold, or  humid.  Avoid people who have a sickness you can catch (contagious).  Keep all follow-up visits. Contact a doctor if:  You cough up more mucus than usual.  There is a change in the color or thickness of the mucus.  It is harder to breathe than usual.  Your breathing is faster than usual.  You have trouble sleeping.  You need to use your medicines more often than usual.  You have trouble doing your normal activities such as getting dressed or walking around the house. Get help right away if:  You have shortness of breath while resting.  You have shortness of breath that stops you from: ? Being able to talk. ? Doing normal activities.  Your chest hurts for longer than 5 minutes.  Your skin color is more blue than usual.  Your pulse oximeter shows that you have low oxygen for longer than 5 minutes.  You have a fever.  You feel too tired to breathe normally. These symptoms may represent a serious problem that is an emergency. Do not wait to see if the symptoms will go away. Get medical help right away. Call your local emergency services (911 in the [place] Do not drive yourself to the hospital. Summary  Chronic obstructive pulmonary disease (COPD) is a long-term lung problem.  The way your lungs work will never return to normal. Usually the condition gets worse over time. There are things you can do to keep yourself as healthy as possible.  Take over-the-counter and prescription medicines only as told by your doctor.  If you smoke, stop. Smoking makes the problem worse. This information is not intended to replace advice given to you by your health care provider. Make sure you discuss any questions you have with your health care provider. Document Revised: 06/20/2020 Document Reviewed: 06/20/2020 Elsevier Patient Education  2021 Elsevier Inc.   

## 2020-09-09 LAB — COMPREHENSIVE METABOLIC PANEL
ALT: 20 IU/L (ref 0–32)
AST: 18 IU/L (ref 0–40)
Albumin/Globulin Ratio: 1.8 (ref 1.2–2.2)
Albumin: 4.3 g/dL (ref 3.8–4.9)
Alkaline Phosphatase: 163 IU/L — ABNORMAL HIGH (ref 44–121)
BUN/Creatinine Ratio: 18 (ref 9–23)
BUN: 12 mg/dL (ref 6–24)
Bilirubin Total: 0.2 mg/dL (ref 0.0–1.2)
CO2: 21 mmol/L (ref 20–29)
Calcium: 9 mg/dL (ref 8.7–10.2)
Chloride: 104 mmol/L (ref 96–106)
Creatinine, Ser: 0.68 mg/dL (ref 0.57–1.00)
GFR calc Af Amer: 116 mL/min/{1.73_m2} (ref >59)
GFR calc non Af Amer: 101 mL/min/{1.73_m2} (ref >59)
Globulin, Total: 2.4 g/dL (ref 1.5–4.5)
Glucose: 102 mg/dL — ABNORMAL HIGH (ref 65–99)
Potassium: 3.4 mmol/L — ABNORMAL LOW (ref 3.5–5.2)
Sodium: 142 mmol/L (ref 134–144)
Total Protein: 6.7 g/dL (ref 6.0–8.5)

## 2020-09-09 LAB — CBC WITH DIFFERENTIAL/PLATELET
Basophils Absolute: 0.1 10*3/uL (ref 0.0–0.2)
Basos: 1 %
EOS (ABSOLUTE): 0.3 10*3/uL (ref 0.0–0.4)
Eos: 4 %
Hematocrit: 38.4 % (ref 34.0–46.6)
Hemoglobin: 12 g/dL (ref 11.1–15.9)
Immature Grans (Abs): 0 10*3/uL (ref 0.0–0.1)
Immature Granulocytes: 0 %
Lymphocytes Absolute: 1.1 10*3/uL (ref 0.7–3.1)
Lymphs: 12 %
MCH: 24.8 pg — ABNORMAL LOW (ref 26.6–33.0)
MCHC: 31.3 g/dL — ABNORMAL LOW (ref 31.5–35.7)
MCV: 79 fL (ref 79–97)
Monocytes Absolute: 0.7 10*3/uL (ref 0.1–0.9)
Monocytes: 8 %
Neutrophils Absolute: 6.7 10*3/uL (ref 1.4–7.0)
Neutrophils: 75 %
Platelets: 179 10*3/uL (ref 150–450)
RBC: 4.84 x10E6/uL (ref 3.77–5.28)
RDW: 16.7 % — ABNORMAL HIGH (ref 11.7–15.4)
WBC: 8.9 10*3/uL (ref 3.4–10.8)

## 2020-09-09 LAB — HEMOGLOBIN A1C
Est. average glucose Bld gHb Est-mCnc: 108 mg/dL
Hgb A1c MFr Bld: 5.4 % (ref 4.8–5.6)

## 2020-09-09 LAB — VITAMIN D 25 HYDROXY (VIT D DEFICIENCY, FRACTURES): Vit D, 25-Hydroxy: 15.1 ng/mL — ABNORMAL LOW (ref 30.0–100.0)

## 2020-09-09 LAB — LIPID PANEL WITH LDL/HDL RATIO
Cholesterol, Total: 113 mg/dL (ref 100–199)
HDL: 29 mg/dL — ABNORMAL LOW (ref >39)
LDL Chol Calc (NIH): 59 mg/dL (ref 0–99)
LDL/HDL Ratio: 2 ratio (ref 0.0–3.2)
Triglycerides: 141 mg/dL (ref 0–149)
VLDL Cholesterol Cal: 25 mg/dL (ref 5–40)

## 2020-09-09 LAB — TSH: TSH: 2.8 u[IU]/mL (ref 0.450–4.500)

## 2020-09-11 ENCOUNTER — Telehealth: Payer: Self-pay | Admitting: *Deleted

## 2020-09-11 ENCOUNTER — Other Ambulatory Visit: Payer: Self-pay | Admitting: Physician Assistant

## 2020-09-11 MED ORDER — VITAMIN D (ERGOCALCIFEROL) 1.25 MG (50000 UNIT) PO CAPS: 50000.0000 [IU] | ORAL_CAPSULE | ORAL | 1 refills | Status: DC

## 2020-09-11 NOTE — Telephone Encounter (Signed)
Patient returning call for lab results:  Notified per chart: Blood count is stable. Kidney function is normal. Liver enzymes overall are stable. Alkaline phosphatase did increase just slightly compared to last check on our end. When Dr. Posey Pronto checked in September this level had been normal. This is most likely associated with the gallbladder. However, the fact that it increased is something I feel we should investigate or may be worthwhile to let your surgeon know to see if they would want to repeat the [place] to make sure there is not a retained stone since you had so many. Also of note potassium is borderline low. I think could increase this by adding potassium rich foods, such as sweet potatoes, broccoli, bananas, leafy greens. We can recheck these two labs in 2-4 weeks. Cholesterol is normal. Thyroid is normal. A1c/sugar is normal. Vit D levels are low. Will send in high dose Vit D prescription that you take once weekly.

## 2020-09-11 NOTE — Progress Notes (Signed)
Vit d sent in for low vit d

## 2020-09-12 ENCOUNTER — Telehealth: Payer: Self-pay

## 2020-09-12 DIAGNOSIS — I10 Essential (primary) hypertension: Secondary | ICD-10-CM

## 2020-09-12 NOTE — Telephone Encounter (Signed)
-----   Message from [name], [place] sent at 09/11/2020  9:03 AM EST ----- Blood count is stable. Kidney function is normal. Liver enzymes overall are stable. Alkaline phosphatase did increase just slightly compared to last check on our end. When Dr. Posey Pronto checked in September this level had been normal. This is most likely associated with the gallbladder. However, the fact that it increased is something I feel we should investigate or may be worthwhile to let your surgeon know to see if they would want to repeat the [place] to make sure there is not a retained stone since you had so many. Also of note potassium is borderline low. I think could increase this by adding potassium rich foods, such as sweet potatoes, broccoli, bananas, leafy greens. We can recheck these two labs in 2-4 weeks. Cholesterol is normal. Thyroid is normal. A1c/sugar is normal. Vit D levels are low. Will send in high dose Vit D prescription that you take once weekly.

## 2020-09-19 ENCOUNTER — Encounter: Payer: Self-pay | Admitting: Surgery

## 2020-09-19 ENCOUNTER — Ambulatory Visit (INDEPENDENT_AMBULATORY_CARE_PROVIDER_SITE_OTHER): Payer: Medicare Other | Admitting: Surgery

## 2020-09-19 ENCOUNTER — Other Ambulatory Visit: Payer: Self-pay

## 2020-09-19 VITALS — BP 153/84 | HR 80 | Temp 98.1°F | Ht 61.0 in | Wt 190.2 lb

## 2020-09-19 DIAGNOSIS — R748 Abnormal levels of other serum enzymes: Secondary | ICD-10-CM | POA: Insufficient documentation

## 2020-09-19 DIAGNOSIS — R109 Unspecified abdominal pain: Secondary | ICD-10-CM

## 2020-09-19 DIAGNOSIS — Z9049 Acquired absence of other specified parts of digestive tract: Secondary | ICD-10-CM

## 2020-09-19 NOTE — Patient Instructions (Addendum)
Dr Christian Mate suggest patient to increase Fiber intake and increase fluid intake. Patient may try over the counter Fiber Supplement and Miralax (one cap full daily) to help regulate bowels. Patient is scheduled for Abdomen Ultrasound at [facility] on January 31 st at 9:00 am arrive at 8:30 am. Once results are received Dr Christian Mate will contact patient to discuss.  GENERAL POST-OPERATIVE PATIENT INSTRUCTIONS   WOUND CARE INSTRUCTIONS:  Keep a dry clean dressing on the wound if there is drainage. The initial bandage may be removed after 24 hours.  Once the wound has quit draining you may leave it open to air.  If clothing rubs against the wound or causes irritation and the wound is not draining you may cover it with a dry dressing during the daytime.  Try to keep the wound dry and avoid ointments on the wound unless directed to do so.  If the wound becomes bright red and painful or starts to drain infected material that is not clear, please contact your physician immediately.  If the wound is mildly pink and has a thick firm ridge underneath it, this is normal, and is referred to as a healing ridge.  This will resolve over the next 4-6 weeks.  BATHING: You may shower if you have been informed of this by your surgeon. However, Please do not submerge in a tub, hot tub, or pool until incisions are completely sealed or have been told by your surgeon that you may do so.  DIET:  You may eat any foods that you can tolerate.  It is a good idea to eat a high fiber diet and take in plenty of fluids to prevent constipation.  If you do become constipated you may want to take a mild laxative or take ducolax tablets on a daily basis until your bowel habits are regular.  Constipation can be very uncomfortable, along with straining, after recent surgery.  ACTIVITY:  You are encouraged to cough and deep breath or use your incentive spirometer if you were given one, every 15-30 minutes when awake.  This will help  prevent respiratory complications and low grade fevers post-operatively if you had a general anesthetic.  You may want to hug a pillow when coughing and sneezing to add additional support to the surgical area, if you had abdominal or chest surgery, which will decrease pain during these times.  You are encouraged to walk and engage in light activity for the next two weeks.  You should not lift more than 20 pounds, until 4 to 6 weeks after surgery as it could put you at increased risk for complications.  Twenty pounds is roughly equivalent to a plastic bag of groceries. At that time- Listen to your body when lifting, if you have pain when lifting, stop and then try again in a few days. Soreness after doing exercises or activities of daily living is normal as you get back in to your normal routine.  MEDICATIONS:  Try to take narcotic medications and anti-inflammatory medications, such as tylenol, ibuprofen, naprosyn, etc., with food.  This will minimize stomach upset from the medication.  Should you develop nausea and vomiting from the pain medication, or develop a rash, please discontinue the medication and contact your physician.  You should not drive, make important decisions, or operate machinery when taking narcotic pain medication.  SUNBLOCK Use sun block to incision area over the next year if this area will be exposed to sun. This helps decrease scarring and will allow  you avoid a permanent darkened area over your incision.  QUESTIONS:  Please feel free to call our office if you have any questions, and we will be glad to assist you. [phone]. High-Fiber Eating Plan Fiber, also called dietary fiber, is a type of carbohydrate. It is found foods such as fruits, vegetables, whole grains, and beans. A high-fiber diet can have many health benefits. Your health care provider may recommend a high-fiber diet to help:  Prevent constipation. Fiber can make your bowel movements more regular.  Lower your  cholesterol.  Relieve the following conditions: ? Inflammation of veins in the anus (hemorrhoids). ? Inflammation of specific areas of the digestive tract (uncomplicated diverticulosis). ? A problem of the large intestine, also called the colon, that sometimes causes pain and diarrhea (irritable bowel syndrome, or IBS).  Prevent overeating as part of a weight-loss plan.  Prevent heart disease, type 2 diabetes, and certain cancers. What are tips for following this plan? Reading food labels  Check the nutrition facts label on food products for the amount of dietary fiber. Choose foods that have 5 grams of fiber or more per serving.  The goals for recommended daily fiber intake include: ? Men (age 19 or younger): 34-38 g. ? Men (over age 97): 28-34 g. ? Women (age 18 or younger): 25-28 g. ? Women (over age 73): 22-25 g. Your daily fiber goal is _____________ g.   Shopping  Choose whole fruits and vegetables instead of processed forms, such as apple juice or applesauce.  Choose a wide variety of high-fiber foods such as avocados, lentils, oats, and kidney beans.  Read the nutrition facts label of the foods you choose. Be aware of foods with added fiber. These foods often have high sugar and sodium amounts per serving. Cooking  Use whole-grain flour for baking and cooking.  Cook with brown rice instead of white rice. Meal planning  Start the day with a breakfast that is high in fiber, such as a cereal that contains 5 g of fiber or more per serving.  Eat breads and cereals that are made with whole-grain flour instead of refined flour or white flour.  Eat brown rice, bulgur wheat, or millet instead of white rice.  Use beans in place of meat in soups, salads, and pasta dishes.  Be sure that half of the grains you eat each day are whole grains. General information  You can get the recommended daily intake of dietary fiber by: ? Eating a variety of fruits, vegetables, grains,  nuts, and beans. ? Taking a fiber supplement if you are not able to take in enough fiber in your diet. It is better to get fiber through food than from a supplement.  Gradually increase how much fiber you consume. If you increase your intake of dietary fiber too quickly, you may have bloating, cramping, or gas.  Drink plenty of water to help you digest fiber.  Choose high-fiber snacks, such as berries, raw vegetables, nuts, and popcorn. What foods should I eat? Fruits Berries. Pears. Apples. Oranges. Avocado. Prunes and raisins. Dried figs. Vegetables Sweet potatoes. Spinach. Kale. Artichokes. Cabbage. Broccoli. Cauliflower. Green peas. Carrots. Squash. Grains Whole-grain breads. Multigrain cereal. Oats and oatmeal. Brown rice. Barley. Bulgur wheat. [place]. Quinoa. Bran muffins. Popcorn. Rye wafer crackers. Meats and other proteins Navy beans, kidney beans, and pinto beans. Soybeans. Split peas. Lentils. Nuts and seeds. Dairy Fiber-fortified yogurt. Beverages Fiber-fortified soy milk. Fiber-fortified orange juice. Other foods Fiber bars. The items listed above may not  be a complete list of recommended foods and beverages. Contact a dietitian for more information. What foods should I avoid? Fruits Fruit juice. Cooked, strained fruit. Vegetables Fried potatoes. Canned vegetables. Well-cooked vegetables. Grains White bread. Pasta made with refined flour. White rice. Meats and other proteins Fatty cuts of meat. Fried chicken or fried fish. Dairy Milk. Yogurt. Cream cheese. Sour cream. Fats and oils Butters. Beverages Soft drinks. Other foods Cakes and pastries. The items listed above may not be a complete list of foods and beverages to avoid. Talk with your dietitian about what choices are best for you. Summary  Fiber is a type of carbohydrate. It is found in foods such as fruits, vegetables, whole grains, and beans.  A high-fiber diet has many benefits. It can help to  prevent constipation, lower blood cholesterol, aid weight loss, and reduce your risk of heart disease, diabetes, and certain cancers.  Increase your intake of fiber gradually. Increasing fiber too quickly may cause cramping, bloating, and gas. Drink plenty of water while you increase the amount of fiber you consume.  The best sources of fiber include whole fruits and vegetables, whole grains, nuts, seeds, and beans. This information is not intended to replace advice given to you by your health care provider. Make sure you discuss any questions you have with your health care provider. Document Revised: 12/16/2019 Document Reviewed: 12/16/2019 Elsevier Patient Education  2021 [employer].

## 2020-09-19 NOTE — Progress Notes (Signed)
[facility] POST-OP OFFICE VISIT  09/19/2020  HPI: Susan Gilmore is a 55 y.o. female 2 months s/p robotic cholecystectomy for acute on chronic cholecystitis.  She is doing well, no c/o n/v, and f/c.  Isolated abnormal alk phos.  No imaging of biliary tree post-op   Vital signs: BP (!) 153/84   Pulse 80   Temp 98.1 F (36.7 C) (Oral)   Ht 5' 1"  (1.549 m)   Wt 190 lb 3.2 oz (86.3 kg)   SpO2 95%   BMI 35.94 kg/m    Physical Exam: Constitutional: appears well   Abdomen: benign, soft and NT Skin: anicteric, incisions well healed.     Assessment/Plan: This is a 55 y.o. female 2 months days s/p robotic chole.  Isolated alkaline phosphatase.   Patient Active Problem List   Diagnosis Date Noted  . COPD with acute exacerbation ([place]) 09/08/2020  . Status post laparoscopic cholecystectomy 08/01/2020  . OSA on CPAP 05/03/2019  . Coronary artery disease due to calcified coronary lesion 10/23/2018  . 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 Encounter for chronic pain management 12/13/2015  . Chronic pain 12/13/2015  . Asthma 06/05/2015  . Rheumatoid arthritis ([place]) 06/05/2015  . 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 Allergic rhinitis 03/15/2015  . ADD (attention deficit disorder) 12/29/2014  . 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 Back ache 12/29/2014  . Bipolar disorder ([place]) 12/29/2014  . Carpal tunnel syndrome 12/29/2014  . Chest pain 12/29/2014  . Chronic anxiety 12/29/2014  . Elevated blood sugar 12/29/2014  . Elevated rheumatoid factor 12/29/2014  . Acid reflux 12/29/2014  . Infectious human wart virus 12/29/2014  . Benign neoplasm of colon 12/29/2014  . Cephalalgia 12/29/2014  . Cannot sleep 12/29/2014  . Awareness of heartbeats 12/29/2014  . LAD (lymphadenopathy), retroperitoneal 12/29/2014  . Fast heart beat 12/29/2014  . Avitaminosis D 12/29/2014  . Paroxysmal digital cyanosis 02/17/2014  . Ventricular  bigeminy 01/14/2012  . Hypertension 01/14/2012  . Abnormal EKG 01/14/2012    - Doubt biliary etiology, but will proceed with repeat RUQ u/s.  Consider bone etiology.   May need MRCP if evidence of biliary ductal dilation.     Ronny Bacon M.D., FACS 09/19/2020, 9:58 AM

## 2020-09-25 ENCOUNTER — Ambulatory Visit
Admission: RE | Admit: 2020-09-25 | Discharge: 2020-09-25 | Disposition: A | Discharge status: Home / Self Care | Payer: Medicare Other | Source: Ambulatory Visit | Attending: Surgery | Admitting: Surgery

## 2020-09-25 ENCOUNTER — Other Ambulatory Visit: Payer: Self-pay

## 2020-09-25 DIAGNOSIS — R109 Unspecified abdominal pain: Secondary | ICD-10-CM | POA: Diagnosis not present

## 2020-09-25 DIAGNOSIS — Z9049 Acquired absence of other specified parts of digestive tract: Secondary | ICD-10-CM | POA: Insufficient documentation

## 2020-09-25 DIAGNOSIS — K760 Fatty (change of) liver, not elsewhere classified: Secondary | ICD-10-CM | POA: Diagnosis not present

## 2020-10-02 DIAGNOSIS — Z7952 Long term (current) use of systemic steroids: Secondary | ICD-10-CM | POA: Diagnosis not present

## 2020-10-02 DIAGNOSIS — M0579 Rheumatoid arthritis with rheumatoid factor of multiple sites without organ or systems involvement: Secondary | ICD-10-CM | POA: Diagnosis not present

## 2020-10-02 DIAGNOSIS — T380X5A Adverse effect of glucocorticoids and synthetic analogues, initial encounter: Secondary | ICD-10-CM | POA: Diagnosis not present

## 2020-10-02 DIAGNOSIS — D84821 Immunodeficiency due to drugs: Secondary | ICD-10-CM | POA: Diagnosis not present

## 2020-10-02 DIAGNOSIS — Z79899 Other long term (current) drug therapy: Secondary | ICD-10-CM | POA: Diagnosis not present

## 2020-10-07 ENCOUNTER — Other Ambulatory Visit: Payer: Self-pay | Admitting: Cardiology

## 2020-10-07 ENCOUNTER — Other Ambulatory Visit: Payer: Self-pay | Admitting: Physician Assistant

## 2020-10-07 DIAGNOSIS — I10 Essential (primary) hypertension: Secondary | ICD-10-CM

## 2020-10-09 ENCOUNTER — Ambulatory Visit: Payer: Medicare Other | Admitting: Cardiology

## 2020-12-20 ENCOUNTER — Ambulatory Visit: Payer: Medicare Other | Admitting: Cardiology

## 2020-12-23 ENCOUNTER — Other Ambulatory Visit: Payer: Self-pay | Admitting: Physician Assistant

## 2020-12-23 DIAGNOSIS — J9621 Acute and chronic respiratory failure with hypoxia: Secondary | ICD-10-CM

## 2020-12-23 DIAGNOSIS — J4551 Severe persistent asthma with (acute) exacerbation: Secondary | ICD-10-CM

## 2021-01-01 ENCOUNTER — Encounter: Payer: Self-pay | Admitting: Cardiology

## 2021-01-01 DIAGNOSIS — M0579 Rheumatoid arthritis with rheumatoid factor of multiple sites without organ or systems involvement: Secondary | ICD-10-CM | POA: Diagnosis not present

## 2021-01-01 DIAGNOSIS — R21 Rash and other nonspecific skin eruption: Secondary | ICD-10-CM | POA: Diagnosis not present

## 2021-01-01 DIAGNOSIS — Z79899 Other long term (current) drug therapy: Secondary | ICD-10-CM | POA: Diagnosis not present

## 2021-01-01 DIAGNOSIS — Z7952 Long term (current) use of systemic steroids: Secondary | ICD-10-CM | POA: Diagnosis not present

## 2021-01-03 DIAGNOSIS — L603 Nail dystrophy: Secondary | ICD-10-CM | POA: Diagnosis not present

## 2021-01-03 DIAGNOSIS — R21 Rash and other nonspecific skin eruption: Secondary | ICD-10-CM | POA: Diagnosis not present

## 2021-01-03 DIAGNOSIS — L280 Lichen simplex chronicus: Secondary | ICD-10-CM | POA: Diagnosis not present

## 2021-01-05 ENCOUNTER — Other Ambulatory Visit: Payer: Self-pay | Admitting: Cardiology

## 2021-01-05 DIAGNOSIS — I10 Essential (primary) hypertension: Secondary | ICD-10-CM

## 2021-01-06 ENCOUNTER — Other Ambulatory Visit: Payer: Self-pay | Admitting: Physician Assistant

## 2021-01-06 DIAGNOSIS — I10 Essential (primary) hypertension: Secondary | ICD-10-CM

## 2021-01-09 ENCOUNTER — Telehealth: Payer: Medicare Other | Admitting: Cardiology

## 2021-01-17 DIAGNOSIS — R21 Rash and other nonspecific skin eruption: Secondary | ICD-10-CM | POA: Diagnosis not present

## 2021-01-17 DIAGNOSIS — D485 Neoplasm of uncertain behavior of skin: Secondary | ICD-10-CM | POA: Diagnosis not present

## 2021-01-17 DIAGNOSIS — B351 Tinea unguium: Secondary | ICD-10-CM | POA: Diagnosis not present

## 2021-01-17 DIAGNOSIS — L280 Lichen simplex chronicus: Secondary | ICD-10-CM | POA: Diagnosis not present

## 2021-03-09 ENCOUNTER — Other Ambulatory Visit: Payer: Self-pay | Admitting: Family Medicine

## 2021-03-09 DIAGNOSIS — I10 Essential (primary) hypertension: Secondary | ICD-10-CM

## 2021-03-09 NOTE — Telephone Encounter (Signed)
   Notes to clinic:  Patient next appt is not  until 09/03/2021  Review for refills until that time Failed protocol: encounter within 6 months   Requested Prescriptions  Pending Prescriptions Disp Refills   losartan (COZAAR) 100 MG tablet [Pharmacy Med Name: LOSARTAN POTASSIUM 100 MG TAB] 90 tablet 0    Sig: TAKE 1 TABLET BY MOUTH EVERY DAY      Cardiovascular:  Angiotensin Receptor Blockers Failed - 03/09/2021  1:51 AM      Failed - Cr in normal range and within 180 days    Creat  Date Value Ref Range Status  06/12/2017 0.94 0.50 - 1.10 mg/dL Final   Creatinine, Ser  Date Value Ref Range Status  09/08/2020 0.68 0.57 - 1.00 mg/dL Final          Failed - K in normal range and within 180 days    Potassium  Date Value Ref Range Status  09/08/2020 3.4 (L) 3.5 - 5.2 mmol/L Final  06/22/2014 3.6 3.5 - 5.1 mmol/L Final          Failed - Last BP in normal range    BP Readings from Last 1 Encounters:  09/19/20 (!) 153/84          Failed - Valid encounter within last 6 months    Recent Outpatient Visits           6 months ago Hypertension, unspecified type   [facility], Clearnce Sorrel, [place]   1 year ago Encounter for surveillance of injectable contraceptive   [facility], Dionne Bucy, [place]   1 year ago Rheumatoid arthritis involving multiple sites, unspecified whether rheumatoid factor present [facility])   [place], [place], [place]   2 years ago COPD with acute exacerbation [facility])   [place], [place], [place]   2 years ago Acute cystitis without hematuria   [facility], Clearnce Sorrel, [place] - Patient is not pregnant

## 2021-03-19 ENCOUNTER — Other Ambulatory Visit: Payer: Self-pay | Admitting: Physician Assistant

## 2021-05-09 ENCOUNTER — Other Ambulatory Visit: Payer: Self-pay | Admitting: Cardiology

## 2021-05-09 DIAGNOSIS — I2510 Atherosclerotic heart disease of native coronary artery without angina pectoris: Secondary | ICD-10-CM

## 2021-05-31 ENCOUNTER — Other Ambulatory Visit: Payer: Self-pay | Admitting: Cardiology

## 2021-05-31 DIAGNOSIS — I2510 Atherosclerotic heart disease of native coronary artery without angina pectoris: Secondary | ICD-10-CM

## 2021-05-31 DIAGNOSIS — I2584 Coronary atherosclerosis due to calcified coronary lesion: Secondary | ICD-10-CM

## 2021-06-06 ENCOUNTER — Ambulatory Visit (HOSPITAL_BASED_OUTPATIENT_CLINIC_OR_DEPARTMENT_OTHER): Payer: Medicare Other | Admitting: Cardiology

## 2021-06-22 ENCOUNTER — Other Ambulatory Visit: Payer: Self-pay | Admitting: Cardiology

## 2021-06-22 DIAGNOSIS — I2584 Coronary atherosclerosis due to calcified coronary lesion: Secondary | ICD-10-CM

## 2021-06-22 DIAGNOSIS — I2510 Atherosclerotic heart disease of native coronary artery without angina pectoris: Secondary | ICD-10-CM

## 2021-07-02 ENCOUNTER — Other Ambulatory Visit: Payer: Self-pay | Admitting: Family Medicine

## 2021-07-02 DIAGNOSIS — J9621 Acute and chronic respiratory failure with hypoxia: Secondary | ICD-10-CM

## 2021-07-02 DIAGNOSIS — J4551 Severe persistent asthma with (acute) exacerbation: Secondary | ICD-10-CM

## 2021-07-02 NOTE — Telephone Encounter (Signed)
Requested Prescriptions  Pending Prescriptions Disp Refills  . ipratropium-albuterol (DUONEB) 0.5-2.5 (3) MG/3ML SOLN [Pharmacy Med Name: IPRAT-ALBUT 0.5-3(2.5) MG/3 ML] 1080 mL 0    Sig: TAKE 3 MLS BY NEBULIZATION EVERY 6 (SIX) HOURS AS NEEDED (SHORTNESS OF BREATH).     Pulmonology:  Combination Products Passed - 07/02/2021  9:59 AM      Passed - Valid encounter within last 12 months    Recent Outpatient Visits          9 months ago Hypertension, unspecified type   [facility] [place], Clearnce Sorrel, [place]   1 year ago Encounter for surveillance of injectable contraceptive   [facility], Dionne Bucy, MD   1 year ago Rheumatoid arthritis involving multiple sites, unspecified whether rheumatoid factor present [facility])   [place], [place], PA-C   2 years ago COPD with acute exacerbation [facility])   [place], [place], [place]   2 years ago Acute cystitis without hematuria   [facility], Clearnce Sorrel, [place]      Future Appointments            In 3 days [name], PA-C [employer], [place]   In 2 weeks [name], MD [facility], [facility]

## 2021-07-05 ENCOUNTER — Ambulatory Visit (INDEPENDENT_AMBULATORY_CARE_PROVIDER_SITE_OTHER): Payer: Medicare Other | Admitting: Physician Assistant

## 2021-07-05 ENCOUNTER — Encounter: Payer: Self-pay | Admitting: Physician Assistant

## 2021-07-05 ENCOUNTER — Other Ambulatory Visit: Payer: Self-pay

## 2021-07-05 ENCOUNTER — Other Ambulatory Visit: Payer: Self-pay | Admitting: Physician Assistant

## 2021-07-05 ENCOUNTER — Telehealth: Payer: Self-pay

## 2021-07-05 VITALS — BP 153/70 | HR 86 | Temp 98.1°F | Ht 61.0 in | Wt 161.8 lb

## 2021-07-05 DIAGNOSIS — E785 Hyperlipidemia, unspecified: Secondary | ICD-10-CM | POA: Diagnosis not present

## 2021-07-05 DIAGNOSIS — Z862 Personal history of diseases of the blood and blood-forming organs and certain disorders involving the immune mechanism: Secondary | ICD-10-CM | POA: Diagnosis not present

## 2021-07-05 DIAGNOSIS — M05711 Rheumatoid arthritis with rheumatoid factor of right shoulder without organ or systems involvement: Secondary | ICD-10-CM

## 2021-07-05 DIAGNOSIS — N950 Postmenopausal bleeding: Secondary | ICD-10-CM | POA: Diagnosis not present

## 2021-07-05 DIAGNOSIS — R350 Frequency of micturition: Secondary | ICD-10-CM | POA: Insufficient documentation

## 2021-07-05 DIAGNOSIS — I10 Essential (primary) hypertension: Secondary | ICD-10-CM

## 2021-07-05 DIAGNOSIS — E559 Vitamin D deficiency, unspecified: Secondary | ICD-10-CM | POA: Diagnosis not present

## 2021-07-05 DIAGNOSIS — Z23 Encounter for immunization: Secondary | ICD-10-CM

## 2021-07-05 LAB — POCT URINALYSIS DIPSTICK
Bilirubin, UA: NEGATIVE
Glucose, UA: NEGATIVE
Ketones, UA: NEGATIVE
Leukocytes, UA: NEGATIVE
Nitrite, UA: NEGATIVE
Protein, UA: POSITIVE — AB
Spec Grav, UA: 1.025 (ref 1.010–1.025)
Urobilinogen, UA: 0.2 E.U./dL
pH, UA: 6 (ref 5.0–8.0)

## 2021-07-05 MED ORDER — LOSARTAN POTASSIUM 100 MG PO TABS: 100.0000 mg | ORAL_TABLET | Freq: Every day | ORAL | 2 refills | Status: DC

## 2021-07-05 MED ORDER — LEFLUNOMIDE 20 MG PO TABS: 20.0000 mg | ORAL_TABLET | Freq: Every day | ORAL | 0 refills | Status: DC

## 2021-07-05 MED ORDER — PREDNISONE 5 MG PO TABS: 5.0000 mg | ORAL_TABLET | Freq: Every day | ORAL | 0 refills | Status: AC

## 2021-07-05 NOTE — Telephone Encounter (Signed)
[facility] referring for ABN vaginal bleeding postmenopausal. Called and left voicemail for patient to call back to be scheduled.

## 2021-07-05 NOTE — Progress Notes (Signed)
Established patient visit   Patient: Susan Gilmore   DOB: [birth year]   55 y.o. Female  MRN: [id number] Visit Date: 07/05/2021  Today's healthcare provider: [name], PA-C   Chief Complaint  Patient presents with   Contraception   Subjective    HPI  Susan Gilmore is a 55 y/o female presents today with concerns of abnormal vaginal/uterine bleeding x4 months.  She states she stopped her Depo injection 2 years ago, had no cycle from then until July.  Starting in July she started having vaginal bleeding that was very sporadic but happens most days, enough to feel a moderate sized pad.  Reports no abdominal discomfort, vaginal pain, other discharge.  Is not sexually active.  Last Pap smear we have on record 03/06/2015 appears to be negative for malignancy but positive for HPV, unable to identify any follow-up testing.  Reports an unusual smell to her urine over the last few weeks and a slight increase in frequency, denies dysuria, visible hematuria.    Medications: Outpatient Medications Prior to Visit  Medication Sig   albuterol (PROAIR HFA) 108 (90 Base) MCG/ACT inhaler Inhale 1-2 puffs into the lungs every 6 (six) hours as needed for wheezing or shortness of breath.   ALPRAZolam (XANAX) 1 MG tablet Take 1 mg by mouth 2 (two) times daily as needed for anxiety.   amLODipine (NORVASC) 5 MG tablet TAKE 1 TABLET BY MOUTH EVERY DAY   amphetamine-dextroamphetamine (ADDERALL XR) 30 MG 24 hr capsule TAKE 2 CAPSULES BY MOUTH IN THE MORNING   aspirin EC 81 MG tablet Take 1 tablet (81 mg total) by mouth daily.   atorvastatin (LIPITOR) 40 MG tablet TAKE 1 TABLET (40 MG TOTAL) BY MOUTH DAILY. KEEP UPCOMING APPOINTMENT FOR FUTURE REFILLS.   budesonide-formoterol (SYMBICORT) 160-4.5 MCG/ACT inhaler Inhale 2 puffs into the lungs in the morning and at bedtime.   DULoxetine (CYMBALTA) 60 MG capsule Take 120 mg by mouth daily.    fluticasone (FLONASE) 50 MCG/ACT nasal spray Place 2 sprays into both  nostrils daily. (Patient taking differently: Place 2 sprays into both nostrils 2 (two) times daily.)   FLUZONE QUADRIVALENT 0.5 ML injection    hydrochlorothiazide (HYDRODIURIL) 25 MG tablet TAKE 1 TABLET BY MOUTH EVERY DAY   ibuprofen (ADVIL) 800 MG tablet TAKE 1 TABLET(800 MG) BY MOUTH EVERY 8 HOURS AS NEEDED   ipratropium-albuterol (DUONEB) 0.5-2.5 (3) MG/3ML SOLN TAKE 3 MLS BY NEBULIZATION EVERY 6 (SIX) HOURS AS NEEDED (SHORTNESS OF BREATH).   loratadine (CLARITIN) 10 MG tablet Take 1 tablet by mouth daily as needed for allergies.    Vitamin D, Ergocalciferol, (DRISDOL) 1.25 MG (50000 UNIT) CAPS capsule Take 1 capsule (50,000 Units total) by mouth every 7 (seven) days.   zolpidem (AMBIEN) 10 MG tablet Take 10 mg by mouth at bedtime.    [DISCONTINUED] losartan (COZAAR) 100 MG tablet TAKE 1 TABLET BY MOUTH EVERY DAY   [DISCONTINUED] leflunomide (ARAVA) 20 MG tablet Take 20 mg by mouth daily. (Patient not taking: Reported on 07/05/2021)   [DISCONTINUED] montelukast (SINGULAIR) 10 MG tablet Take 1 tablet by mouth daily as needed.  (Patient not taking: Reported on 07/05/2021)   [DISCONTINUED] omeprazole (PRILOSEC) 20 MG capsule Take 1 capsule (20 mg total) by mouth daily. (Patient not taking: Reported on 07/05/2021)   [DISCONTINUED] predniSONE (DELTASONE) 5 MG tablet Take 5 mg by mouth daily with breakfast.  (Patient not taking: Reported on 07/05/2021)   No facility-administered medications prior to visit.  Review of Systems  Constitutional:  Negative for fatigue and fever.  Respiratory:  Negative for chest tightness and shortness of breath.   Cardiovascular:  Negative for chest pain.  Gastrointestinal:  Negative for abdominal pain, constipation, diarrhea and nausea.  Genitourinary:  Positive for dysuria, frequency and vaginal bleeding. Negative for hematuria, pelvic pain, vaginal discharge and vaginal pain.  All other systems reviewed and are negative.    Objective    Blood pressure (!)  153/70, pulse 86, temperature 98.1 F (36.7 C), temperature source Oral, height 5\' 1"  (1.549 m), weight 161 lb 12.8 oz (73.4 kg), SpO2 100 %.    Physical Exam Constitutional:      General: She is awake.     Appearance: She is well-developed.  HENT:     Head: Normocephalic.  Eyes:     Conjunctiva/sclera: Conjunctivae normal.  Cardiovascular:     Rate and Rhythm: Normal rate and regular rhythm.     Heart sounds: Normal heart sounds.  Pulmonary:     Effort: Pulmonary effort is normal.     Breath sounds: Normal breath sounds.  Abdominal:     Palpations: Abdomen is soft. There is no mass.     Tenderness: There is no abdominal tenderness.  Skin:    General: Skin is warm.  Neurological:     Mental Status: She is alert and oriented to person, place, and time.  Psychiatric:        Attention and Perception: Attention normal.        Mood and Affect: Mood normal.        Speech: Speech normal.        Behavior: Behavior is cooperative.     No results found for any visits on 07/05/21.  Assessment & Plan     Problem List Items Addressed This Visit       Cardiovascular and Mediastinum   Hypertension    Pt in pain today d/t rheum arthritis no home BP records Will f/u in 3 mo, continue amlodipine and losartan. Pt does not take HCTZ regularly, only when she feels swelling. Has f/u scheduled w/ cardio this month      Relevant Medications   losartan (COZAAR) 100 MG tablet     Musculoskeletal and Integument   Rheumatoid arthritis ([place])    Fell off f/u with rheum, will schedule. Refilled Arava and Prednisone temporarily to get her to next appointment. She typically has well managed symptoms on these meds.      Relevant Medications   leflunomide (ARAVA) 20 MG tablet   predniSONE (DELTASONE) 5 MG tablet     Other   Avitaminosis D   Relevant Orders   Vitamin D (25 hydroxy)   Post-menopausal bleeding - Primary    Ordered transvag and transab ultrasound Ref to gyn, in need of  pap w/ history of hpv and to manage post-meno bleeding      Relevant Orders   Ambulatory referral to Gynecology   US PELVIC COMPLETE WITH TRANSVAGINAL   Hyperlipidemia    Currently takes statin appears to have been stable in the past on med Will recheck lipid + cmp      Relevant Medications   losartan (COZAAR) 100 MG tablet   Other Relevant Orders   Comprehensive Metabolic Panel (CMET)   Lipid Profile   Urinary frequency    UA today - leuk        Relevant Orders   POCT Urinalysis Dipstick   Other Visit Diagnoses  Essential hypertension       Relevant Medications   losartan (COZAAR) 100 MG tablet   History of anemia       Relevant Orders   CBC w/Diff/Platelet   Need for immunization against influenza       Relevant Orders   Flu Vaccine QUAD 80mo+IM (Fluarix, Fluzone & Alfiuria Quad PF) (Completed)        Return in about 3 months (around 10/05/2021) for hypertension.      I, [name], PA-C have reviewed all documentation for this visit. The documentation on  07/05/2021  for the exam, diagnosis, procedures, and orders are all accurate and complete.    [name], PA-C  [facility] [phone] (phone) [phone] (fax)  [place]

## 2021-07-05 NOTE — Assessment & Plan Note (Addendum)
Pt in pain today d/t rheum arthritis no home BP records Will f/u in 3 mo, continue amlodipine and losartan. Pt does not take HCTZ regularly, only when she feels swelling. Has f/u scheduled w/ cardio this month

## 2021-07-05 NOTE — Assessment & Plan Note (Signed)
Ordered transvag and transab ultrasound Ref to gyn, in need of pap w/ history of hpv and to manage post-meno bleeding

## 2021-07-05 NOTE — Assessment & Plan Note (Signed)
Fell off f/u with rheum, will schedule. Refilled Arava and Prednisone temporarily to get her to next appointment. She typically has well managed symptoms on these meds.

## 2021-07-05 NOTE — Telephone Encounter (Signed)
Called and left voicemail for patient to call back to be scheduled. 

## 2021-07-05 NOTE — Assessment & Plan Note (Signed)
UA today - leuk

## 2021-07-05 NOTE — Assessment & Plan Note (Signed)
Currently takes statin appears to have been stable in the past on med Will recheck lipid + cmp

## 2021-07-06 ENCOUNTER — Other Ambulatory Visit: Payer: Self-pay | Admitting: Physician Assistant

## 2021-07-06 DIAGNOSIS — D500 Iron deficiency anemia secondary to blood loss (chronic): Secondary | ICD-10-CM

## 2021-07-06 LAB — COMPREHENSIVE METABOLIC PANEL
ALT: 21 IU/L (ref 0–32)
AST: 18 IU/L (ref 0–40)
Albumin/Globulin Ratio: 1.7 (ref 1.2–2.2)
Albumin: 3.9 g/dL (ref 3.8–4.9)
Alkaline Phosphatase: 140 IU/L — ABNORMAL HIGH (ref 44–121)
BUN/Creatinine Ratio: 23 (ref 9–23)
BUN: 16 mg/dL (ref 6–24)
Bilirubin Total: <0.2 mg/dL (ref 0.0–1.2)
CO2: 24 mmol/L (ref 20–29)
Calcium: 8.9 mg/dL (ref 8.7–10.2)
Chloride: 103 mmol/L (ref 96–106)
Creatinine, Ser: 0.71 mg/dL (ref 0.57–1.00)
Globulin, Total: 2.3 g/dL (ref 1.5–4.5)
Glucose: 108 mg/dL — ABNORMAL HIGH (ref 70–99)
Potassium: 3.7 mmol/L (ref 3.5–5.2)
Sodium: 140 mmol/L (ref 134–144)
Total Protein: 6.2 g/dL (ref 6.0–8.5)
eGFR: 102 mL/min/{1.73_m2} (ref >59)

## 2021-07-06 LAB — CBC WITH DIFFERENTIAL/PLATELET
Basophils Absolute: 0.1 10*3/uL (ref 0.0–0.2)
Basos: 1 %
EOS (ABSOLUTE): 0 10*3/uL (ref 0.0–0.4)
Eos: 0 %
Hematocrit: 35.1 % (ref 34.0–46.6)
Hemoglobin: 10.9 g/dL — ABNORMAL LOW (ref 11.1–15.9)
Immature Grans (Abs): 0 10*3/uL (ref 0.0–0.1)
Immature Granulocytes: 0 %
Lymphocytes Absolute: 1 10*3/uL (ref 0.7–3.1)
Lymphs: 10 %
MCH: 24.5 pg — ABNORMAL LOW (ref 26.6–33.0)
MCHC: 31.1 g/dL — ABNORMAL LOW (ref 31.5–35.7)
MCV: 79 fL (ref 79–97)
Monocytes Absolute: 0.6 10*3/uL (ref 0.1–0.9)
Monocytes: 6 %
Neutrophils Absolute: 8.3 10*3/uL — ABNORMAL HIGH (ref 1.4–7.0)
Neutrophils: 83 %
Platelets: 189 10*3/uL (ref 150–450)
RBC: 4.44 x10E6/uL (ref 3.77–5.28)
RDW: 15.2 % (ref 11.7–15.4)
WBC: 9.9 10*3/uL (ref 3.4–10.8)

## 2021-07-06 LAB — LIPID PANEL
Chol/HDL Ratio: 4.2 ratio (ref 0.0–4.4)
Cholesterol, Total: 129 mg/dL (ref 100–199)
HDL: 31 mg/dL — ABNORMAL LOW (ref >39)
LDL Chol Calc (NIH): 73 mg/dL (ref 0–99)
Triglycerides: 143 mg/dL (ref 0–149)
VLDL Cholesterol Cal: 25 mg/dL (ref 5–40)

## 2021-07-06 LAB — VITAMIN D 25 HYDROXY (VIT D DEFICIENCY, FRACTURES): Vit D, 25-Hydroxy: 42.6 ng/mL (ref 30.0–100.0)

## 2021-07-06 NOTE — Telephone Encounter (Signed)
All labs WNL aside from Hgb-- 10.9 but she has a direct cause of this anemia Fine to continue this med until her next rheum appointment

## 2021-07-06 NOTE — Progress Notes (Signed)
Anemic from blood loss from abnormal uterine bleeding Pt scheduled for ultrasound next week, working on gyn appointment

## 2021-07-09 NOTE — Telephone Encounter (Signed)
Patient is scheduled for 07/23/21 with [facility]

## 2021-07-12 ENCOUNTER — Telehealth: Payer: Self-pay

## 2021-07-12 DIAGNOSIS — D500 Iron deficiency anemia secondary to blood loss (chronic): Secondary | ICD-10-CM

## 2021-07-12 NOTE — Telephone Encounter (Signed)
Patient aware. Scheduled 3 month follow up. She agreed to iron deficiency labs.

## 2021-07-12 NOTE — Telephone Encounter (Signed)
-----   Message from [name], PA-C sent at 07/06/2021  9:51 AM EST ----- Anemic--likely due to chronic blood loss from abnormal uterine bleeding. Stress the ultrasound and gyn appointment.  Recommend starting an iron supplement-- anything otc that says 'iron 65 mg' or ferrous sulfate 325 mg (these are the same). Take every other day.  I would also like if she did some iron deficiency labs to get a baseline, I will order these.  Otherwise labs look fine to me, keep 3 mo f/u for blood pressure, and now for anemia as well.

## 2021-07-13 ENCOUNTER — Ambulatory Visit: Admission: RE | Admit: 2021-07-13 | Payer: Medicare Other | Source: Ambulatory Visit

## 2021-07-13 ENCOUNTER — Other Ambulatory Visit: Payer: Self-pay | Admitting: Cardiology

## 2021-07-13 DIAGNOSIS — I2510 Atherosclerotic heart disease of native coronary artery without angina pectoris: Secondary | ICD-10-CM

## 2021-07-16 ENCOUNTER — Ambulatory Visit (INDEPENDENT_AMBULATORY_CARE_PROVIDER_SITE_OTHER): Payer: Medicare Other | Admitting: Cardiology

## 2021-07-16 ENCOUNTER — Other Ambulatory Visit: Payer: Self-pay

## 2021-07-16 ENCOUNTER — Encounter (HOSPITAL_BASED_OUTPATIENT_CLINIC_OR_DEPARTMENT_OTHER): Payer: Self-pay | Admitting: Cardiology

## 2021-07-16 VITALS — BP 156/84 | HR 81 | Ht 61.0 in | Wt 160.3 lb

## 2021-07-16 DIAGNOSIS — R9431 Abnormal electrocardiogram [ECG] [EKG]: Secondary | ICD-10-CM

## 2021-07-16 DIAGNOSIS — Z7189 Other specified counseling: Secondary | ICD-10-CM

## 2021-07-16 DIAGNOSIS — E782 Mixed hyperlipidemia: Secondary | ICD-10-CM | POA: Diagnosis not present

## 2021-07-16 DIAGNOSIS — I2584 Coronary atherosclerosis due to calcified coronary lesion: Secondary | ICD-10-CM | POA: Diagnosis not present

## 2021-07-16 DIAGNOSIS — I10 Essential (primary) hypertension: Secondary | ICD-10-CM | POA: Diagnosis not present

## 2021-07-16 DIAGNOSIS — I2510 Atherosclerotic heart disease of native coronary artery without angina pectoris: Secondary | ICD-10-CM | POA: Diagnosis not present

## 2021-07-16 MED ORDER — AMLODIPINE BESYLATE 10 MG PO TABS: 10.0000 mg | ORAL_TABLET | Freq: Every day | ORAL | 3 refills | Status: AC

## 2021-07-16 MED ORDER — AMLODIPINE BESYLATE 10 MG PO TABS: 10.0000 mg | ORAL_TABLET | Freq: Every day | ORAL | 3 refills | Status: DC

## 2021-07-16 NOTE — Patient Instructions (Signed)
Medication Instructions:  INCREASE: Amlodipine to 10 mg daily  *If you need a refill on your cardiac medications before your next appointment, please call your pharmacy*   Lab Work: None ordered today   Testing/Procedures: None ordered today   Follow-Up: At [facility], you and your health needs are our priority.  As part of our continuing mission to provide you with exceptional heart care, we have created designated Provider Care Teams.  These Care Teams include your primary Cardiologist (physician) and Advanced Practice Providers (APPs -  Physician Assistants and Nurse Practitioners) who all work together to provide you with the care you need, when you need it.  We recommend signing up for the patient portal called "MyChart".  Sign up information is provided on this After Visit Summary.  MyChart is used to connect with patients for Virtual Visits (Telemedicine).  Patients are able to view lab/test results, encounter notes, upcoming appointments, etc.  Non-urgent messages can be sent to your provider as well.   To learn more about what you can do with MyChart, go to [email or web address].    Your next appointment:   3 month(s)  The format for your next appointment:   In Person  Provider:   [name], MD

## 2021-07-16 NOTE — Progress Notes (Signed)
Cardiology Office Note:    Date:  07/16/2021   ID:  Susan Gilmore, DOB [birth year], MRN [id number]  PCP:  [name], PA-C  Cardiologist:  [name], MD PhD  Referring MD: [name]*   CC: follow up  History of Present Illness:    Susan Gilmore is a 55 y.o. female with a hx of rheumatoid arthritis, COPD who is seen in follow up. She was initially seen as a new consult on 04/24/18.   Cardiac history: initially seen in consult for elevated BNP (no echo). However, noted chest discomfort at that time. Nonexertional but dull, hadn't occurred recently. She was very short of breath. Had been a heavy smoker (2-3 ppd) but changed to vaping. Had risk factors for sleep apnea, was ordered for sleep study and now pending CPAP.  Remotely, was seen by Dr. [name] in 2012, reports having a normal cath (I can see the Ao pressures but not the anatomy on the uploaded document). She also saw Dr. [name] in 2013.  She reports a history of bigeminy (found in 2012-2013 incidentally). Rare issue for her, notices it when she lays down to sleep. Computer notes atrial fib/flutter in the past (she is not aware of this diagnosis, never on blood thinners). I personally reviewed all of her ECGs in the system and cannot find evidence of this. Had coronary CTA, was 99th percentile in calcium score. Seen in ER at [facility] 03/16/19 with aching pain that was worse with deep breathing or movement. HSTn ruled out ACS. ECG was sinus tach with ST depressions/TWI in multiple leads. This was similar to prior ECG from 04/24/18. She wasn't told what might be the cause, she thinks it was her arthritis.   Today: Overall, she is feeling good. Lately, her stress levels have been better. At home her blood pressure has been as high as 180 when she was angry, but has never seen any low readings such as in the 100s. On average her blood pressure is in the 140s/80s.  At times she notices her heart racing, but this  does not occur every day. She also does not feel limited by her heart rate.  Sometimes she is able to be active, but she continues to be limited by her rheumatoid arthritis mainly in her shoulders, elbows, and neck. She notes her pain tends to occur randomly.  After her cholecystectomy 07/15/2020, she subsequently lost 20 lbs. She also plans to watch her diet closely during the coming holidays.   She denies any chest pain, or shortness of breath. No lightheadedness, headaches, syncope, orthopnea, PND, or lower extremity edema.   Past Medical History:  Diagnosis Date   Abnormal Pap smear 04/28/2012   normal pap /positive hpv    Abnormal stress test    Allergic rhinitis    Bipolar disorder (HCC)    Carpal tunnel syndrome    Chest pain    Chronic anxiety    Contraception    Cough    HPV (human papilloma virus) anogenital infection    HTN (hypertension)    Hyperplastic colon polyp    Insomnia    Palpitations    Pneumonia     Past Surgical History:  Procedure Laterality Date   CHOLECYSTECTOMY     full mouth dental     GANGLION CYST EXCISION     WISDOM TOOTH EXTRACTION      Current Medications: Current Outpatient Medications on File Prior to Visit  Medication Sig   albuterol (PROAIR HFA)  108 (90 Base) MCG/ACT inhaler Inhale 1-2 puffs into the lungs every 6 (six) hours as needed for wheezing or shortness of breath.   ALPRAZolam (XANAX) 1 MG tablet Take 1 mg by mouth 2 (two) times daily as needed for anxiety.   amphetamine-dextroamphetamine (ADDERALL XR) 30 MG 24 hr capsule TAKE 2 CAPSULES BY MOUTH IN THE MORNING   aspirin EC 81 MG tablet Take 1 tablet (81 mg total) by mouth daily.   atorvastatin (LIPITOR) 40 MG tablet TAKE 1 TABLET (40 MG TOTAL) BY MOUTH DAILY. KEEP UPCOMING APPOINTMENT FOR FUTURE REFILLS.   budesonide-formoterol (SYMBICORT) 160-4.5 MCG/ACT inhaler Inhale 2 puffs into the lungs in the morning and at bedtime.   DULoxetine (CYMBALTA) 60 MG capsule Take 120 mg by  mouth daily.    fluticasone (FLONASE) 50 MCG/ACT nasal spray Place 2 sprays into both nostrils daily. (Patient taking differently: Place 2 sprays into both nostrils 2 (two) times daily.)   FLUZONE QUADRIVALENT 0.5 ML injection    hydrochlorothiazide (HYDRODIURIL) 25 MG tablet TAKE 1 TABLET BY MOUTH EVERY DAY   ibuprofen (ADVIL) 800 MG tablet TAKE 1 TABLET(800 MG) BY MOUTH EVERY 8 HOURS AS NEEDED   ipratropium-albuterol (DUONEB) 0.5-2.5 (3) MG/3ML SOLN TAKE 3 MLS BY NEBULIZATION EVERY 6 (SIX) HOURS AS NEEDED (SHORTNESS OF BREATH).   leflunomide (ARAVA) 20 MG tablet TAKE 1 TABLET(20 MG) BY MOUTH DAILY   loratadine (CLARITIN) 10 MG tablet Take 1 tablet by mouth daily as needed for allergies.    losartan (COZAAR) 100 MG tablet Take 1 tablet (100 mg total) by mouth daily.   predniSONE (DELTASONE) 5 MG tablet Take 1 tablet (5 mg total) by mouth daily with breakfast.   zolpidem (AMBIEN) 10 MG tablet Take 10 mg by mouth at bedtime.    No current facility-administered medications on file prior to visit.     Allergies:   Effexor [venlafaxine hcl] and Isosorbide nitrate   Social History   Tobacco Use   Smoking status: Former    Packs/day: 1.00    Years: 26.00    Pack years: 26.00    Types: Cigarettes    Quit date: 07/26/2013    Years since quitting: 7.9   Smokeless tobacco: Never  Vaping Use   Vaping Use: Some days  Substance Use Topics   Alcohol use: No    Alcohol/week: 0.0 standard drinks    Comment: Had a history of alcohol use but currently is not drinking any   Drug use: No    Family History: The patient's family history includes Diabetes in her maternal grandmother and mother; Heart attack in her mother; Heart disease in her father, maternal grandfather, maternal grandmother, and paternal grandmother; Hyperlipidemia in her father; Hypertension in her father, maternal grandfather, and maternal grandmother; Stroke in her father and paternal grandmother.  ROS:   Please see the history  of present illness.   (+) Stress (+) Arthralgias (+) Palpitations Additional pertinent ROS negative except as documented.  EKGs/Labs/Other Studies Reviewed:    The following studies were reviewed today:  CTA Chest 03/16/2019: COMPARISON:  Chest radiographs obtained earlier today. Cardiac/coronary CT dated 06/04/2018. Abdomen and pelvis CT dated 06/01/2014.   FINDINGS: Cardiovascular: Borderline enlarged heart. Atheromatous calcifications, including the coronary arteries and aorta. Suboptimally opacified pulmonary arteries in the upper lung zones with no visible pulmonary arterial filling defects.   Mediastinum/Nodes: Mildly enlarged bilateral hilar lymph nodes. These include a left hilar node with a short axis diameter of 13 mm on image number 107 series  6 and a right hilar node with a short axis diameter of 12 mm on image number 106 series 6. No enlarged mediastinal or axillary nodes. Unremarkable esophagus and included portion of the thyroid gland.   Lungs/Pleura: Minimal bibasilar atelectasis. Multiple small bilateral lung nodules. These include the previously noted 5 mm nodule in the right middle lobe, currently measuring 5 mm on image number 43 series 7.   Minimal bibasilar atelectasis. No pleural fluid. Multiple other nodules in the included portions of both lungs previously are unchanged. The largest is in the right lower lobe, measuring 6 mm in maximum diameter and 5 mm in mean diameter on image number 44 series 7.   Upper Abdomen: The lateral segment of the left lobe of the liver and caudate lobe are prominent, without significant change. The spleen is also mildly prominent, without abnormal enlargement previously, only partially included today.   Musculoskeletal: Mild thoracic spine degenerative changes.   Review of the MIP images confirms the above findings.   IMPRESSION: 1. Suboptimal opacification of the pulmonary arteries in the upper lung zones with no  pulmonary emboli seen. 2. Mildly bilateral hilar adenopathy, most likely reactive. 3. Stable small bilateral lung nodules measuring up to 5 mm in mean diameter each. No follow-up needed if patient is low-risk (and has no known or suspected primary neoplasm). Non-contrast chest CT can be considered in 12 months if patient is high-risk. This recommendation follows the consensus statement: Guidelines for Management of Incidental Pulmonary Nodules Detected on CT Images: From the Fleischner Society 2017; Radiology 2017; 284:228-243. 4. Stable possible early changes of cirrhosis of the liver and borderline splenomegaly. 5. Calcific coronary artery and aortic atherosclerosis.   Aortic Atherosclerosis (ICD10-I70.0).  CTA coronary 06/04/18 IMPRESSION: 1. Coronary calcium score of 210. This was 37 percentile for age and sex matched control.   2. Normal coronary origin with right dominance.   3. Moderate stenosis in the mid RCA and LAD. Additional analysis with CT FFR will be submitted.  1. Left Main:  No significant stenosis.  2. LAD: No significant stenosis. 3. LCX: No significant stenosis. 4. RCA: No significant stenosis.   IMPRESSION: 1. CT FFR analysis didn't show any significant stenosis. Aggressive medical management for non-obstructive CAD is recommended.  Echo 05/04/18 - Left ventricle: The cavity size was normal. 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 Systolic function was normal. The   estimated ejection fraction was in the range of 60% to 65%. Wall   motion was normal; there were no regional wall motion   abnormalities. Doppler parameters are consistent with abnormal   left ventricular relaxation (grade 1 diastolic dysfunction). - Aortic valve: Poorly visualized. Probably trileaflet; mildly   calcified leaflets. There was no stenosis. - Mitral valve: There was no significant regurgitation. - Right  ventricle: The cavity size was normal. Systolic function   was normal. - Pulmonary arteries: No complete TR doppler jet so unable to   estimate PA systolic pressure. - Inferior vena cava: The vessel was normal in size. The   respirophasic diameter changes were in the normal range (>= 50%),   consistent with normal central venous pressure.   Impressions:  - Normal LV size with prominent LV hypertrophy towards the apex.   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 EF 60-65%. Normal RV size and   systolic function. No significant valvular abnormalities.  EKG:  EKG is personally  reviewed today.   07/16/2021: NSR 81 bpm, borderline LVH/nonspecific ST pattern 04/07/2020: sinus tachycardia at 102 bpm with PVCs and nonspecific ST changes  Recent Labs: 09/08/2020: TSH 2.800 07/05/2021: ALT 21; BUN 16; Creatinine, Ser 0.71; Hemoglobin 10.9; Platelets 189; Potassium 3.7; Sodium 140   Recent Lipid Panel    Component Value Date/Time   CHOL 129 07/05/2021 0918   CHOL 166 06/22/2014 1033   TRIG 143 07/05/2021 0918   TRIG 175 06/22/2014 1033   HDL 31 (L) 07/05/2021 0918   HDL 39 (L) 06/22/2014 1033   CHOLHDL 4.2 07/05/2021 0918   VLDL 35 06/22/2014 1033   LDLCALC 73 07/05/2021 0918   LDLCALC 92 06/22/2014 1033    Physical Exam:    VS:  BP (!) 156/84 (BP Location: Left Arm, Patient Position: Sitting, Cuff Size: Large)   Pulse 81   Ht 5\' 1"  (1.549 m)   Wt 160 lb 4.8 oz (72.7 kg)   BMI 30.29 kg/m     Wt Readings from Last 3 Encounters:  07/16/21 160 lb 4.8 oz (72.7 kg)  07/05/21 161 lb 12.8 oz (73.4 kg)  09/19/20 190 lb 3.2 oz (86.3 kg)    GEN: Well nourished, well developed in no acute distress HEENT: Normal, moist mucous membranes NECK: No JVD CARDIAC: regular rhythm, normal S1 and S2, no rubs or gallops. No murmur. VASCULAR: Radial and DP pulses 2+ bilaterally. No carotid bruits RESPIRATORY:  Clear to auscultation without  rales, wheezing or rhonchi  ABDOMEN: Soft, non-tender, non-distended MUSCULOSKELETAL:  Ambulates independently SKIN: Warm and dry, no edema NEUROLOGIC:  Alert and oriented x 3. No focal neuro deficits noted. PSYCHIATRIC:  Normal affect    ASSESSMENT:    1. Essential hypertension   2. Coronary artery disease due to calcified coronary lesion   3. Mixed hyperlipidemia   4. Abnormal EKG   5. Counseling on health promotion and disease prevention     PLAN:    Chest pain, abnormal EKG--> coronary CTA with diffuse calcification, 99th percentile, with two localized lesions (FFR WNL). Consistent with coronary artery disease.  -aggressive prevention.  -tolerating atorvastatin 40 mg. -reviewed red flag warning signs that need immediate medical attention -continue aspirin 81 mg.  Hypertension -elevated today -continue losartan 100 mg daily, HCTZ 25 mg daily -increase amlodipine to 10 mg daily -can change ARB and thiazide if additional BP control needed in the future -next new med would be spironolactone given diastolic dysfunction. With her underlying lung disease, would avoid beta blocker, though this may also be beneficial for CAD  Mixed hyperlipidemia: -continue atorvastatin 40 mg daily -per [facility], lipids slightly worse than prior. LDL 73, TG 143 -encouraged healthy lifestyle   Secondary Prevention: counseling, review of risk factors -recommend heart healthy/Mediterranean diet, with whole grains, fruits, vegetable, fish, lean meats, nuts, and olive oil. Limit salt. -recommend moderate walking, 3-5 times/week for 30-50 minutes each session. Aim for at least 150 minutes.week. Goal should be pace of 3 miles/hours, or walking 1.5 miles in 30 minutes -recommend avoidance of tobacco products. Avoid excess alcohol.  Plan for follow up: 3 mos or sooner PRN.  Medication Adjustments/Labs and Tests Ordered: Current medicines are reviewed at length with the patient today.  Concerns regarding  medicines are outlined above.   Orders Placed This Encounter  Procedures   EKG 12-Lead    Meds ordered this encounter  Medications   DISCONTD: amLODipine (NORVASC) 10 MG tablet    Sig: Take 1 tablet (10 mg total) by mouth daily.  Dispense:  90 tablet    Refill:  3    Doesn't need filled until next month (she will finish the 5 mg tabs she has)   amLODipine (NORVASC) 10 MG tablet    Sig: Take 1 tablet (10 mg total) by mouth daily.    Dispense:  90 tablet    Refill:  3    Doesn't need filled until next month (she will finish the 5 mg tabs she has)    Patient Instructions  Medication Instructions:  INCREASE: Amlodipine to 10 mg daily  *If you need a refill on your cardiac medications before your next appointment, please call your pharmacy*   Lab Work: None ordered today   Testing/Procedures: None ordered today   Follow-Up: At [facility], you and your health needs are our priority.  As part of our continuing mission to provide you with exceptional heart care, we have created designated Provider Care Teams.  These Care Teams include your primary Cardiologist (physician) and Advanced Practice Providers (APPs -  Physician Assistants and Nurse Practitioners) who all work together to provide you with the care you need, when you need it.  We recommend signing up for the patient portal called "MyChart".  Sign up information is provided on this After Visit Summary.  MyChart is used to connect with patients for Virtual Visits (Telemedicine).  Patients are able to view lab/test results, encounter notes, upcoming appointments, etc.  Non-urgent messages can be sent to your provider as well.   To learn more about what you can do with MyChart, go to [email or web address].    Your next appointment:   3 month(s)  The format for your next appointment:   In Person  Provider:   [name], MD      [facility],acting as a scribe for [name], MD.,have  documented all relevant documentation on the behalf of [name], MD,as directed by  [name], MD while in the presence of [name], MD.  I, [name], MD, have reviewed all documentation for this visit. The documentation on 07/16/21 for the exam, diagnosis, procedures, and orders are all accurate and complete.   Signed, [name], MD PhD 07/16/2021  [facility]

## 2021-07-23 ENCOUNTER — Encounter: Payer: Medicare Other | Admitting: Obstetrics & Gynecology

## 2021-07-24 ENCOUNTER — Encounter: Payer: Self-pay | Admitting: Physician Assistant

## 2021-07-24 ENCOUNTER — Telehealth: Payer: Self-pay

## 2021-07-24 NOTE — Progress Notes (Signed)
No show at [place] appt and [facility], concerned over AUB

## 2021-07-24 NOTE — Telephone Encounter (Signed)
Contacted patient regarding appointments

## 2021-07-30 ENCOUNTER — Other Ambulatory Visit: Payer: Self-pay | Admitting: Cardiology

## 2021-07-30 DIAGNOSIS — I2510 Atherosclerotic heart disease of native coronary artery without angina pectoris: Secondary | ICD-10-CM

## 2021-08-15 ENCOUNTER — Other Ambulatory Visit: Payer: Self-pay | Admitting: Physician Assistant

## 2021-08-15 DIAGNOSIS — M05711 Rheumatoid arthritis with rheumatoid factor of right shoulder without organ or systems involvement: Secondary | ICD-10-CM

## 2021-09-06 DIAGNOSIS — M0579 Rheumatoid arthritis with rheumatoid factor of multiple sites without organ or systems involvement: Secondary | ICD-10-CM | POA: Diagnosis not present

## 2021-09-06 DIAGNOSIS — Z7952 Long term (current) use of systemic steroids: Secondary | ICD-10-CM | POA: Diagnosis not present

## 2021-09-06 DIAGNOSIS — Z79899 Other long term (current) drug therapy: Secondary | ICD-10-CM | POA: Diagnosis not present

## 2021-09-26 ENCOUNTER — Other Ambulatory Visit: Payer: Self-pay | Admitting: Physician Assistant

## 2021-09-26 DIAGNOSIS — J4551 Severe persistent asthma with (acute) exacerbation: Secondary | ICD-10-CM

## 2021-09-26 DIAGNOSIS — J9621 Acute and chronic respiratory failure with hypoxia: Secondary | ICD-10-CM

## 2021-10-08 ENCOUNTER — Encounter: Payer: Self-pay | Admitting: Physician Assistant

## 2021-10-08 ENCOUNTER — Ambulatory Visit (INDEPENDENT_AMBULATORY_CARE_PROVIDER_SITE_OTHER): Payer: Medicare HMO | Admitting: Physician Assistant

## 2021-10-08 ENCOUNTER — Other Ambulatory Visit: Payer: Self-pay

## 2021-10-08 VITALS — BP 159/78 | HR 80 | Ht 60.0 in | Wt 155.9 lb

## 2021-10-08 DIAGNOSIS — I10 Essential (primary) hypertension: Secondary | ICD-10-CM

## 2021-10-08 DIAGNOSIS — N950 Postmenopausal bleeding: Secondary | ICD-10-CM

## 2021-10-08 DIAGNOSIS — I2584 Coronary atherosclerosis due to calcified coronary lesion: Secondary | ICD-10-CM

## 2021-10-08 DIAGNOSIS — I2510 Atherosclerotic heart disease of native coronary artery without angina pectoris: Secondary | ICD-10-CM | POA: Diagnosis not present

## 2021-10-08 DIAGNOSIS — Z9989 Dependence on other enabling machines and devices: Secondary | ICD-10-CM

## 2021-10-08 DIAGNOSIS — J449 Chronic obstructive pulmonary disease, unspecified: Secondary | ICD-10-CM

## 2021-10-08 DIAGNOSIS — G4733 Obstructive sleep apnea (adult) (pediatric): Secondary | ICD-10-CM | POA: Diagnosis not present

## 2021-10-08 DIAGNOSIS — J45909 Unspecified asthma, uncomplicated: Secondary | ICD-10-CM

## 2021-10-08 MED ORDER — BREZTRI AEROSPHERE 160-9-4.8 MCG/ACT IN AERO: 2.0000 | INHALATION_SPRAY | Freq: Two times a day (BID) | RESPIRATORY_TRACT | 11 refills | Status: DC

## 2021-10-08 MED ORDER — ATORVASTATIN CALCIUM 40 MG PO TABS: 40.0000 mg | ORAL_TABLET | Freq: Every day | ORAL | 1 refills | Status: DC

## 2021-10-08 MED ORDER — MONTELUKAST SODIUM 10 MG PO TABS: 10.0000 mg | ORAL_TABLET | Freq: Every day | ORAL | 1 refills | Status: DC

## 2021-10-08 NOTE — Assessment & Plan Note (Signed)
Switch inhaler from symbicort to breztri. Gave copay card. Goal is to use nebulizer less frequently. If no improvement will ref to pulm.

## 2021-10-08 NOTE — Patient Instructions (Signed)
Recombinant Zoster (Shingles) Vaccine: What You Need to Know °1. Why get vaccinated? °Recombinant zoster (shingles) vaccine can prevent shingles. °Shingles (also called herpes zoster, or just zoster) is a painful skin rash, usually with blisters. In addition to the rash, shingles can cause fever, headache, chills, or upset stomach. Rarely, shingles can lead to complications such as pneumonia, hearing problems, blindness, brain inflammation (encephalitis), or death. °The risk of shingles increases with age. The most common complication of shingles is long-term nerve pain called postherpetic neuralgia (PHN). PHN occurs in the areas where the shingles rash was and can last for months or years after the rash goes away. The pain from PHN can be severe and debilitating. °The risk of PHN increases with age. An older adult with shingles is more likely to develop PHN and have longer lasting and more severe pain than a younger person. °People with weakened immune systems also have a higher risk of getting shingles and complications from the disease. °Shingles is caused by varicella-zoster virus, the same virus that causes chickenpox. After you have chickenpox, the virus stays in your body and can cause shingles later in life. Shingles cannot be passed from one person to another, but the virus that causes shingles can spread and cause chickenpox in someone who has never had chickenpox or has never received chickenpox vaccine. °2. Recombinant shingles vaccine °Recombinant shingles vaccine provides strong protection against shingles. By preventing shingles, recombinant shingles vaccine also protects against PHN and other complications. °Recombinant shingles vaccine is recommended for: °Adults 50 years and older °Adults 19 years and older who have a weakened immune system because of disease or treatments °Shingles vaccine is given as a two-dose series. For most people, the second dose should be given 2 to 6 months after the first  dose. Some people who have or will have a weakened immune system can get the second dose 1 to 2 months after the first dose. Ask your health care provider for guidance. °People who have had shingles in the past and people who have received varicella (chickenpox) vaccine are recommended to get recombinant shingles vaccine. The vaccine is also recommended for people who have already gotten another type of shingles vaccine, the live shingles vaccine. There is no live virus in recombinant shingles vaccine. °Shingles vaccine may be given at the same time as other vaccines. °3. Talk with your health care provider °Tell your vaccination provider if the person getting the vaccine: °Has had an allergic reaction after a previous dose of recombinant shingles vaccine, or has any severe, life-threatening allergies °Is currently experiencing an episode of shingles °Is pregnant °In some cases, your health care provider may decide to postpone shingles vaccination until a future visit. °People with minor illnesses, such as a cold, may be vaccinated. People who are moderately or severely ill should usually wait until they recover before getting recombinant shingles vaccine. °Your health care provider can give you more information. °4. Risks of a vaccine reaction °A sore arm with mild or moderate pain is very common after recombinant shingles vaccine. Redness and swelling can also happen at the site of the injection. °Tiredness, muscle pain, headache, shivering, fever, stomach pain, and nausea are common after recombinant shingles vaccine. °These side effects may temporarily prevent a vaccinated person from doing regular activities. Symptoms usually go away on their own in 2 to 3 days. You should still get the second dose of recombinant shingles vaccine even if you had one of these reactions after the first dose. °Guillain-Barré   syndrome (GBS), a serious nervous system disorder, has been reported very rarely after recombinant zoster  vaccine. °People sometimes faint after medical procedures, including vaccination. Tell your provider if you feel dizzy or have vision changes or ringing in the ears. °As with any medicine, there is a very remote chance of a vaccine causing a severe allergic reaction, other serious injury, or death. °5. What if there is a serious problem? °An allergic reaction could occur after the vaccinated person leaves the clinic. If you see signs of a severe allergic reaction (hives, swelling of the face and throat, difficulty breathing, a fast heartbeat, dizziness, or weakness), call 9-1-1 and get the person to the nearest hospital. °For other signs that concern you, call your health care provider. °Adverse reactions should be reported to the Vaccine Adverse Event Reporting System (VAERS). Your health care provider will usually file this report, or you can do it yourself. Visit the VAERS website at www.vaers.hhs.gov or call 1-800-822-7967. VAERS is only for reporting reactions, and VAERS staff members do not give medical advice. °6. How can I learn more? °Ask your health care provider. °Call your local or state health department. °Visit the website of the Food and Drug Administration (FDA) for vaccine package inserts and additional information at www.fda.gov/vaccinesblood-biologics/vaccines. °Contact the Centers for Disease Control and Prevention (CDC): °Call 1-800-232-4636 (1-800-CDC-INFO) or °Visit CDC's website at www.cdc.gov/vaccines. °Vaccine Information Statement Recombinant Zoster Vaccine (09/29/2020) °This information is not intended to replace advice given to you by your health care provider. Make sure you discuss any questions you have with your health care provider. °Document Revised: 04/27/2021 Document Reviewed: 10/13/2020 °Elsevier Patient Education © 2022 [employer]. ° °

## 2021-10-08 NOTE — Assessment & Plan Note (Addendum)
Per pt resolved which is why she cancelled appt with gyn and did not show to her ultrasound. Advised strongly to call office if it occurs again, could be signs of endometrial cancer

## 2021-10-08 NOTE — Assessment & Plan Note (Signed)
Managed by cardio. Systolic still elevated. F/u with cardio in 1 week.

## 2021-10-08 NOTE — Assessment & Plan Note (Signed)
Refilled atorvastatin. Last lipid panel in normal range, stressed importance of compliance w/ medication

## 2021-10-08 NOTE — Assessment & Plan Note (Signed)
From history and exam, components of COPD and asthma Switched claritin to singulair, to take nightly

## 2021-10-08 NOTE — Assessment & Plan Note (Signed)
Not currently using cpap reports machine issue. Will refer to get replacement or fix machine

## 2021-10-08 NOTE — Progress Notes (Signed)
I,[name],acting as a Education administrator for [employer], PA-C.,have documented all relevant documentation on the behalf of [name], PA-C,as directed by  [name], PA-C while in the presence of [name], PA-C.  Established patient visit   Patient: Susan Gilmore   DOB: 31-Oct-1965   56 y.o. Female  MRN: [id number] Visit Date: 10/08/2021  Today's healthcare provider: [name], PA-C   Cc HTN and health maint follow up   Subjective    HPI  Susan Gilmore is a 56 y/o female who presents today with concerns over worsening cough secondary to her COPD. She reports her trailer is 'dusty' which is causing her symptoms. Denies tobacco use. Denies fevers, chills. Reports at times feeling SOB and wheezing. Using duoneb every 4-6 hours daily. Reports not being consistent with symbicort.   Denies continued vaginal bleeding. Did not follow through with GYN or pelvic ultrasound. Hypertension, follow-up  BP Readings from Last 3 Encounters:  10/08/21 (!) 159/78  07/16/21 (!) 156/84  07/05/21 (!) 153/70   Wt Readings from Last 3 Encounters:  10/08/21 155 lb 14.4 oz (70.7 kg)  07/16/21 160 lb 4.8 oz (72.7 kg)  07/05/21 161 lb 12.8 oz (73.4 kg)     She was last seen for hypertension 3 months ago.  BP at that visit was 153/70. Management since that visit includes continue amlodipine and losartan and only takes HCTZ when she is feeling swollen.  She reports excellent compliance with treatment. She is not having side effects.  She is following a Regular diet. She is not exercising. She does not smoke.  Use of agents associated with hypertension: none.   Outside blood pressures are being checked only hen feeling bad.  Symptoms: No chest pain No chest pressure  No palpitations No syncope  No dyspnea No orthopnea  No paroxysmal nocturnal dyspnea No lower extremity edema   Pertinent labs: Lab Results  Component Value Date   CHOL 129 07/05/2021   HDL 31 (L) 07/05/2021    LDLCALC 73 07/05/2021   TRIG 143 07/05/2021   CHOLHDL 4.2 07/05/2021   Lab Results  Component Value Date   NA 140 07/05/2021   K 3.7 07/05/2021   CREATININE 0.71 07/05/2021   EGFR 102 07/05/2021   GLUCOSE 108 (H) 07/05/2021   TSH 2.800 09/08/2020     The ASCVD Risk score (Arnett DK, et al., 2019) failed to calculate for the following reasons:   The valid total cholesterol range is 130 to 320 mg/dL   ---------------------------------------------------------------------------------------------------  Medications: Outpatient Medications Prior to Visit  Medication Sig   albuterol (PROAIR HFA) 108 (90 Base) MCG/ACT inhaler Inhale 1-2 puffs into the lungs every 6 (six) hours as needed for wheezing or shortness of breath.   ALPRAZolam (XANAX) 1 MG tablet Take 1 mg by mouth 2 (two) times daily as needed for anxiety.   amLODipine (NORVASC) 10 MG tablet Take 1 tablet (10 mg total) by mouth daily.   amphetamine-dextroamphetamine (ADDERALL) 20 MG tablet Take 20 mg by mouth 3 (three) times daily.   amphetamine-dextroamphetamine (ADDERALL) 30 MG tablet Take 1 tablet by mouth 2 (two) times daily.   aspirin EC 81 MG tablet Take 1 tablet (81 mg total) by mouth daily.   DULoxetine (CYMBALTA) 60 MG capsule Take 120 mg by mouth daily.    fluticasone (FLONASE) 50 MCG/ACT nasal spray Place 2 sprays into both nostrils daily. (Patient taking differently: Place 2 sprays into both nostrils 2 (two) times daily.)   FLUZONE QUADRIVALENT  0.5 ML injection    hydrochlorothiazide (HYDRODIURIL) 25 MG tablet TAKE 1 TABLET BY MOUTH EVERY DAY   ibuprofen (ADVIL) 800 MG tablet TAKE 1 TABLET(800 MG) BY MOUTH EVERY 8 HOURS AS NEEDED   ipratropium-albuterol (DUONEB) 0.5-2.5 (3) MG/3ML SOLN TAKE 3 MLS BY NEBULIZATION EVERY 6 (SIX) HOURS AS NEEDED (SHORTNESS OF BREATH).   leflunomide (ARAVA) 20 MG tablet TAKE 1 TABLET(20 MG) BY MOUTH DAILY   loratadine (CLARITIN) 10 MG tablet Take 1 tablet by mouth daily as needed for  allergies.    predniSONE (DELTASONE) 5 MG tablet Take by mouth.   zolpidem (AMBIEN) 10 MG tablet Take 10 mg by mouth at bedtime.    [DISCONTINUED] amphetamine-dextroamphetamine (ADDERALL XR) 30 MG 24 hr capsule TAKE 2 CAPSULES BY MOUTH IN THE MORNING   [DISCONTINUED] atorvastatin (LIPITOR) 40 MG tablet TAKE 1 TABLET (40 MG TOTAL) BY MOUTH DAILY. KEEP UPCOMING APPOINTMENT FOR FUTURE REFILLS.   [DISCONTINUED] budesonide-formoterol (SYMBICORT) 160-4.5 MCG/ACT inhaler Inhale 2 puffs into the lungs in the morning and at bedtime.   losartan (COZAAR) 100 MG tablet Take 1 tablet (100 mg total) by mouth daily.   No facility-administered medications prior to visit.    Review of Systems  Constitutional:  Negative for fatigue and fever.  Respiratory:  Positive for cough, shortness of breath and wheezing.   Cardiovascular:  Negative for chest pain and leg swelling.  Gastrointestinal:  Negative for abdominal pain.  Neurological:  Negative for dizziness and headaches.     Objective    Blood pressure (!) 159/78, pulse 80, height 5' (1.524 m), weight 155 lb 14.4 oz (70.7 kg), SpO2 99 %.   Physical Exam Constitutional:      General: She is awake.     Appearance: She is well-developed.  HENT:     Head: Normocephalic.  Eyes:     Conjunctiva/sclera: Conjunctivae normal.  Cardiovascular:     Rate and Rhythm: Normal rate and regular rhythm.     Heart sounds: Normal heart sounds.  Pulmonary:     Effort: Pulmonary effort is normal. No respiratory distress.     Breath sounds: No stridor. Wheezing present. No rhonchi or rales.  Skin:    General: Skin is warm.  Neurological:     Mental Status: She is alert and oriented to person, place, and time.  Psychiatric:        Attention and Perception: Attention normal.        Mood and Affect: Mood normal.        Speech: Speech normal.        Behavior: Behavior is cooperative.     No results found for any visits on 10/08/21.  Assessment & Plan      Declines mammogram, pap smear, colonoscopy. Advised of importance of health maintenance to prevent and catch early disease.  Discussed both tetanus and shingles vaccines. Advised pt to get vaccines at pharmacy.   Problem List Items Addressed This Visit       Cardiovascular and Mediastinum   Hypertension    Managed by cardio. Systolic still elevated. F/u with cardio in 1 week.        Relevant Medications   atorvastatin (LIPITOR) 40 MG tablet   Coronary artery disease due to calcified coronary lesion    Refilled atorvastatin. Last lipid panel in normal range, stressed importance of compliance w/ medication      Relevant Medications   atorvastatin (LIPITOR) 40 MG tablet     Respiratory   Reactive airway disease without  complication    From history and exam, components of COPD and asthma Switched claritin to singulair, to take nightly      Relevant Medications   montelukast (SINGULAIR) 10 MG tablet   OSA on CPAP    Not currently using cpap reports machine issue. Will refer to get replacement or fix machine      Relevant Orders   Cpap titration   Chronic obstructive pulmonary disease ([place]) - Primary    Switch inhaler from symbicort to breztri. Gave copay card. Goal is to use nebulizer less frequently. If no improvement will ref to pulm.       Relevant Medications   montelukast (SINGULAIR) 10 MG tablet   Budeson-Glycopyrrol-Formoterol (BREZTRI AEROSPHERE) 160-9-4.8 MCG/ACT AERO     Other   Post-menopausal bleeding    Per pt resolved which is why she cancelled appt with gyn and did not show to her ultrasound. Advised strongly to call office if it occurs again, could be signs of endometrial cancer        Return in about 3 months (around 01/05/2022) for CPE.      I, [name], PA-C have reviewed all documentation for this visit. The documentation on  10/08/2021  for the exam, diagnosis, procedures, and orders are all accurate and complete.    [name],  PA-C  [facility] [phone] (phone) [phone] (fax)  [place]

## 2021-10-15 ENCOUNTER — Ambulatory Visit (HOSPITAL_BASED_OUTPATIENT_CLINIC_OR_DEPARTMENT_OTHER): Payer: Medicare Other | Admitting: Cardiology

## 2021-10-22 DIAGNOSIS — Z780 Asymptomatic menopausal state: Secondary | ICD-10-CM | POA: Diagnosis not present

## 2021-11-01 ENCOUNTER — Ambulatory Visit: Payer: Self-pay

## 2021-11-01 NOTE — Telephone Encounter (Signed)
? ? ? ?  Chief Complaint: Post-menopausal bleeding ?Symptoms: Bleeding, mild cramps ?Frequency: Started on and off in November ?Pertinent Negatives: Patient denies  ?Disposition: '[]'$ ED /'[]'$ Urgent Care (no appt availability in office) / '[]'$ Appointment(In office/virtual)/ '[]'$  [facility]/ '[]'$ Home Care/ '[]'$ Refused Recommended Disposition /'[]'$  Mobile Bus/ '[]'$  Follow-up with PCP ?Additional Notes: Pt. Requesting a referral to OB/GYN. Please advise.  ?Answer Assessment - Initial Assessment Questions ?1. AMOUNT: "Describe the bleeding that you are having."  ?  - SPOTTING: spotting, or pinkish / brownish mucous discharge; does not fill panty liner or pad  ?  - MILD:  less than 1 pad / hour; less than patient's usual menstrual bleeding ?  - MODERATE: 1-2 pads / hour; 1 menstrual cup every 6 hours; small-medium blood clots (e.g., pea, grape, small coin) ?  - SEVERE: soaking 2 or more pads/hour for 2 or more hours; 1 menstrual cup every 2 hours; bleeding not contained by pads or continuous red blood from vagina; large blood clots (e.g., golf ball, large coin)  ?    Moderate ?2. ONSET: "When did the bleeding begin?" "Is it continuing now?" ?    Few days ago ?3. MENSTRUAL PERIOD: "When was the last normal menstrual period?" "How is this different than your period?" ?    Years ?4. REGULARITY: "How regular are your periods?" ?    No ?5. ABDOMINAL PAIN: "Do you have any pain?" "How bad is the pain?"  (e.g., Scale 1-10; mild, moderate, or severe) ?  - MILD (1-3): doesn't interfere with normal activities, abdomen soft and not tender to touch  ?  - MODERATE (4-7): interferes with normal activities or awakens from sleep, abdomen tender to touch  ?  - SEVERE (8-10): excruciating pain, doubled over, unable to do any normal activities  ?    Mild ?6. PREGNANCY: "Could you be pregnant?" "Are you sexually active?" "Did you recently give birth?" ?    No ?7. BREASTFEEDING: "Are you breastfeeding?" ?    No ?8. HORMONES: "Are you  taking any hormone medications, prescription or OTC?" (e.g., birth control pills, estrogen) ?    No ?9. BLOOD THINNERS: "Do you take any blood thinners?" (e.g., Coumadin/warfarin, Pradaxa/dabigatran, aspirin) ?    Aspirin ?10. CAUSE: "What do you think is causing the bleeding?" (e.g., recent gyn surgery, recent gyn procedure; known bleeding disorder, cervical cancer, polycystic ovarian disease, fibroids)   ?      Unsure ?11. HEMODYNAMIC STATUS: "Are you weak or feeling lightheaded?" If Yes, ask: "Can you stand and walk normally?"  ?      Feels tired ?12. OTHER SYMPTOMS: "What other symptoms are you having with the bleeding?" (e.g., passed tissue, vaginal discharge, fever, menstrual-type cramps) ?      No ? ?Protocols used: Vaginal Bleeding - Abnormal-A-AH ? ?

## 2021-11-01 NOTE — Telephone Encounter (Signed)
Patient has been advised. KW 

## 2021-11-01 NOTE — Addendum Note (Signed)
Addended by: Minette Headland on: 11/01/2021 05:21 PM ? ? Modules accepted: Orders ? ?

## 2021-11-05 DIAGNOSIS — Y999 Unspecified external cause status: Secondary | ICD-10-CM | POA: Diagnosis not present

## 2021-11-05 DIAGNOSIS — S51002A Unspecified open wound of left elbow, initial encounter: Secondary | ICD-10-CM | POA: Diagnosis not present

## 2021-11-05 DIAGNOSIS — M069 Rheumatoid arthritis, unspecified: Secondary | ICD-10-CM | POA: Diagnosis not present

## 2021-11-05 DIAGNOSIS — Z888 Allergy status to other drugs, medicaments and biological substances status: Secondary | ICD-10-CM | POA: Diagnosis not present

## 2021-12-21 DIAGNOSIS — L089 Local infection of the skin and subcutaneous tissue, unspecified: Secondary | ICD-10-CM | POA: Diagnosis not present

## 2021-12-21 DIAGNOSIS — M719 Bursopathy, unspecified: Secondary | ICD-10-CM | POA: Diagnosis not present

## 2021-12-21 DIAGNOSIS — L905 Scar conditions and fibrosis of skin: Secondary | ICD-10-CM | POA: Diagnosis not present

## 2022-01-02 ENCOUNTER — Telehealth: Payer: Self-pay | Admitting: Physician Assistant

## 2022-01-02 NOTE — Telephone Encounter (Signed)
Copied from [place] [id number]. Topic: Medicare AWV ?>> Jan 02, 2022 11:19 AM Cher Nakai R wrote: ?Reason for CRM:  ?Left message for patient to call back and schedule Medicare Annual Wellness Visit (AWV) in office.  ? ?If unable to come into the office for AWV,  please offer to do virtually or by telephone. ? ?Last AWV:  08/29/2020 ? ?Please schedule at anytime with [facility] Health Advisor. ? ?30 minute appointment for Virtual or phone ?45 minute appointment for in office or Initial virtual/phone ? ?Any questions, please contact me at [phone] ?

## 2022-01-09 ENCOUNTER — Other Ambulatory Visit: Payer: Self-pay | Admitting: Physician Assistant

## 2022-01-09 DIAGNOSIS — J449 Chronic obstructive pulmonary disease, unspecified: Secondary | ICD-10-CM

## 2022-01-09 DIAGNOSIS — I2510 Atherosclerotic heart disease of native coronary artery without angina pectoris: Secondary | ICD-10-CM

## 2022-01-09 DIAGNOSIS — J45909 Unspecified asthma, uncomplicated: Secondary | ICD-10-CM

## 2022-01-29 ENCOUNTER — Other Ambulatory Visit: Payer: Self-pay | Admitting: Physician Assistant

## 2022-01-29 DIAGNOSIS — I10 Essential (primary) hypertension: Secondary | ICD-10-CM

## 2022-02-04 NOTE — Progress Notes (Signed)
I,[name],acting as a Education administrator for [employer], PA-C.,have documented all relevant documentation on the behalf of [name], PA-C,as directed by  [name], PA-C while in the presence of [name], PA-C.   Established patient visit   Patient: Susan Gilmore   DOB: 06/14/1966   56 y.o. Female  MRN: [id number] Visit Date: 02/05/2022  Today's healthcare provider: [name], PA-C   Cc. Vaginal bleeding x 2-3 mo  Subjective    HPI  Susan Gilmore is a 56 y/o female who presents for recurrent vaginal bleeding post-menopause. Susan Gilmore was originally seen for this 07/05/21 and was scheduled to see a GYN and have a pelvic ultrasound, but Susan Gilmore no-showed for appointments. At follow up, pt stated the bleeding had stopped. For the last 2-3 mo, Susan Gilmore has had heavy bleeding on and off. Susan Gilmore started taking iron pills again every other day. Denies abdominal pain, changes in BM, urinary symptoms.  Hypertension, follow-up  BP Readings from Last 3 Encounters:  02/05/22 140/75  10/08/21 (!) 159/78  07/16/21 (!) 156/84   Wt Readings from Last 3 Encounters:  02/05/22 155 lb 3.2 oz (70.4 kg)  10/08/21 155 lb 14.4 oz (70.7 kg)  07/16/21 160 lb 4.8 oz (72.7 kg)     Susan Gilmore was last seen for hypertension 4 months ago.  BP at that visit was 159/78. Management since that visit includes managed by cardio.  Susan Gilmore reports good compliance with treatment. Susan Gilmore is not having side effects.  Susan Gilmore is following a Regular diet. Susan Gilmore is exercising. Susan Gilmore does not smoke.  Outside blood pressures are not checked. Susan Gilmore did not go to her last cardiology appointment.  Symptoms: No chest pain No chest pressure  No palpitations No syncope  No dyspnea No orthopnea  No paroxysmal nocturnal dyspnea No lower extremity edema   Pertinent labs Lab Results  Component Value Date   CHOL 129 07/05/2021   HDL 31 (L) 07/05/2021   LDLCALC 73 07/05/2021   TRIG 143 07/05/2021   CHOLHDL 4.2 07/05/2021   Lab Results   Component Value Date   NA 140 07/05/2021   K 3.7 07/05/2021   CREATININE 0.71 07/05/2021   EGFR 102 07/05/2021   GLUCOSE 108 (H) 07/05/2021   TSH 2.800 09/08/2020     The ASCVD Risk score (Arnett DK, et al., 2019) failed to calculate for the following reasons:   The valid total cholesterol range is 130 to 320 mg/dL  ---------------------------------------------------------------------------------------------------   ---------------------------------------------------------------------------------------------------   Medications: Outpatient Medications Prior to Visit  Medication Sig   albuterol (PROAIR HFA) 108 (90 Base) MCG/ACT inhaler Inhale 1-2 puffs into the lungs every 6 (six) hours as needed for wheezing or shortness of breath.   ALPRAZolam (XANAX) 1 MG tablet Take 1 mg by mouth 2 (two) times daily as needed for anxiety.   amLODipine (NORVASC) 10 MG tablet Take 1 tablet (10 mg total) by mouth daily.   amphetamine-dextroamphetamine (ADDERALL) 20 MG tablet Take 20 mg by mouth 3 (three) times daily.   amphetamine-dextroamphetamine (ADDERALL) 30 MG tablet Take 1 tablet by mouth 2 (two) times daily.   aspirin EC 81 MG tablet Take 1 tablet (81 mg total) by mouth daily.   atorvastatin (LIPITOR) 40 MG tablet TAKE 1 TABLET BY MOUTH DAILY. KEEP UPCOMING APPOINTMENT FOR FUTURE REFILLS   Budeson-Glycopyrrol-Formoterol (BREZTRI AEROSPHERE) 160-9-4.8 MCG/ACT AERO Inhale 2 puffs into the lungs 2 (two) times daily.   DULoxetine (CYMBALTA) 60 MG capsule Take 120 mg by mouth daily.  fluocinonide cream (LIDEX) 0.05 %    fluticasone (FLONASE) 50 MCG/ACT nasal spray Place 2 sprays into both nostrils daily. (Patient taking differently: Place 2 sprays into both nostrils 2 (two) times daily.)   FLUZONE QUADRIVALENT 0.5 ML injection    HUMIRA PEN 40 MG/0.4ML PNKT    hydrochlorothiazide (HYDRODIURIL) 25 MG tablet TAKE 1 TABLET BY MOUTH EVERY DAY   ibuprofen (ADVIL) 800 MG tablet TAKE 1 TABLET(800 MG)  BY MOUTH EVERY 8 HOURS AS NEEDED   ipratropium-albuterol (DUONEB) 0.5-2.5 (3) MG/3ML SOLN TAKE 3 MLS BY NEBULIZATION EVERY 6 (SIX) HOURS AS NEEDED (SHORTNESS OF BREATH).   leflunomide (ARAVA) 20 MG tablet TAKE 1 TABLET(20 MG) BY MOUTH DAILY   loratadine (CLARITIN) 10 MG tablet Take 1 tablet by mouth daily as needed for allergies.    losartan (COZAAR) 100 MG tablet TAKE 1 TABLET(100 MG) BY MOUTH DAILY   montelukast (SINGULAIR) 10 MG tablet TAKE 1 TABLET(10 MG) BY MOUTH AT BEDTIME   predniSONE (DELTASONE) 5 MG tablet Take by mouth.   zolpidem (AMBIEN) 10 MG tablet Take 10 mg by mouth at bedtime.    No facility-administered medications prior to visit.    Review of Systems  Constitutional:  Negative for fatigue and fever.  Respiratory:  Negative for cough and shortness of breath.   Cardiovascular:  Negative for chest pain and leg swelling.  Gastrointestinal:  Negative for abdominal pain.  Genitourinary:  Positive for vaginal bleeding.  Neurological:  Negative for dizziness and headaches.      Objective    Blood pressure 140/75, pulse 66, weight 155 lb 3.2 oz (70.4 kg), SpO2 98 %.   Physical Exam Constitutional:      General: Susan Gilmore is awake.     Appearance: Susan Gilmore is well-developed.  HENT:     Head: Normocephalic.  Eyes:     Conjunctiva/sclera: Conjunctivae normal.  Cardiovascular:     Rate and Rhythm: Normal rate and regular rhythm.     Heart sounds: Normal heart sounds.  Pulmonary:     Effort: Pulmonary effort is normal.     Breath sounds: Normal breath sounds.  Abdominal:     General: There is no distension.     Palpations: Abdomen is soft.     Tenderness: There is no abdominal tenderness. There is no guarding or rebound.  Skin:    General: Skin is warm.  Neurological:     Mental Status: Susan Gilmore is alert and oriented to person, place, and time.  Psychiatric:        Attention and Perception: Attention normal.        Mood and Affect: Mood normal.        Speech: Speech normal.         Behavior: Behavior is cooperative.     No results found for any visits on 02/05/22.  Assessment & Plan     Problem List Items Addressed This Visit       Cardiovascular and Mediastinum   Hypertension    Elevated in office; pt does not check at home Managed by cardiology but Susan Gilmore no-showed last appt Currently on max doses of losartan, hctz, amlodipine. Encouraged pt to check at home 2-3 times a week and return w/ values, to reschedule cardiology appointment        Other   Abnormal vaginal bleeding - Primary    Pt was post-menopausal now with recurrent vaginal bleeding;  Ordered pelvic ultrasound and ref to gyn Stressed the importance of following through with testing, as bleeding  of this nature can indicate something as serious of endometrial cancer. Ordered cbc to r/o anemia      Relevant Orders   CBC w/Diff/Platelet   Comprehensive Metabolic Panel (CMET)   US Pelvic Complete With Transvaginal   Ambulatory referral to Gynecology    Pt due for tetanus shot, encouraged her to get at the pharmacy.  Return in about 3 months (around 05/08/2022) for hypertension, CPE.      I, [name], PA-C have reviewed all documentation for this visit. The documentation on  02/05/2022 for the exam, diagnosis, procedures, and orders are all accurate and complete.  [name], PA-C [facility] [id number] [place], [place], [address] Office: [phone] Fax: [place]

## 2022-02-05 ENCOUNTER — Encounter: Payer: Self-pay | Admitting: Physician Assistant

## 2022-02-05 ENCOUNTER — Telehealth: Payer: Self-pay | Admitting: Family Medicine

## 2022-02-05 ENCOUNTER — Ambulatory Visit (INDEPENDENT_AMBULATORY_CARE_PROVIDER_SITE_OTHER): Payer: Medicare Other | Admitting: Physician Assistant

## 2022-02-05 VITALS — BP 140/75 | HR 66 | Wt 155.2 lb

## 2022-02-05 DIAGNOSIS — I10 Essential (primary) hypertension: Secondary | ICD-10-CM

## 2022-02-05 DIAGNOSIS — N939 Abnormal uterine and vaginal bleeding, unspecified: Secondary | ICD-10-CM | POA: Diagnosis not present

## 2022-02-05 NOTE — Assessment & Plan Note (Signed)
Elevated in office; pt does not check at home Managed by cardiology but she no-showed last appt Currently on max doses of losartan, hctz, amlodipine. Encouraged pt to check at home 2-3 times a week and return w/ values, to reschedule cardiology appointment

## 2022-02-05 NOTE — Telephone Encounter (Signed)
[facility] referring for Abnormal vaginal bleeding. Sch with MD or ABC. Called and left voicemail for patient to call back to be scheduled.

## 2022-02-05 NOTE — Assessment & Plan Note (Addendum)
Pt was post-menopausal now with recurrent vaginal bleeding;  Ordered pelvic ultrasound and ref to gyn Stressed the importance of following through with testing, as bleeding of this nature can indicate something as serious of endometrial cancer. Ordered cbc to r/o anemia

## 2022-02-06 LAB — CBC WITH DIFFERENTIAL/PLATELET
Basophils Absolute: 0.1 10*3/uL (ref 0.0–0.2)
Basos: 1 %
EOS (ABSOLUTE): 0 10*3/uL (ref 0.0–0.4)
Eos: 0 %
Hematocrit: 39.8 % (ref 34.0–46.6)
Hemoglobin: 12.9 g/dL (ref 11.1–15.9)
Immature Grans (Abs): 0 10*3/uL (ref 0.0–0.1)
Immature Granulocytes: 0 %
Lymphocytes Absolute: 0.9 10*3/uL (ref 0.7–3.1)
Lymphs: 10 %
MCH: 27.3 pg (ref 26.6–33.0)
MCHC: 32.4 g/dL (ref 31.5–35.7)
MCV: 84 fL (ref 79–97)
Monocytes Absolute: 0.7 10*3/uL (ref 0.1–0.9)
Monocytes: 8 %
Neutrophils Absolute: 7.5 10*3/uL — ABNORMAL HIGH (ref 1.4–7.0)
Neutrophils: 81 %
Platelets: 171 10*3/uL (ref 150–450)
RBC: 4.73 x10E6/uL (ref 3.77–5.28)
RDW: 16.7 % — ABNORMAL HIGH (ref 11.7–15.4)
WBC: 9.3 10*3/uL (ref 3.4–10.8)

## 2022-02-06 LAB — COMPREHENSIVE METABOLIC PANEL
ALT: 22 IU/L (ref 0–32)
AST: 16 IU/L (ref 0–40)
Albumin/Globulin Ratio: 1.7 (ref 1.2–2.2)
Albumin: 4 g/dL (ref 3.8–4.9)
Alkaline Phosphatase: 123 IU/L — ABNORMAL HIGH (ref 44–121)
BUN/Creatinine Ratio: 15 (ref 9–23)
BUN: 12 mg/dL (ref 6–24)
Bilirubin Total: 0.5 mg/dL (ref 0.0–1.2)
CO2: 24 mmol/L (ref 20–29)
Calcium: 9 mg/dL (ref 8.7–10.2)
Chloride: 103 mmol/L (ref 96–106)
Creatinine, Ser: 0.82 mg/dL (ref 0.57–1.00)
Globulin, Total: 2.4 g/dL (ref 1.5–4.5)
Glucose: 72 mg/dL (ref 70–99)
Potassium: 4.5 mmol/L (ref 3.5–5.2)
Sodium: 142 mmol/L (ref 134–144)
Total Protein: 6.4 g/dL (ref 6.0–8.5)
eGFR: 85 mL/min/{1.73_m2} (ref >59)

## 2022-02-07 ENCOUNTER — Ambulatory Visit: Admission: RE | Admit: 2022-02-07 | Payer: Medicare Other | Source: Ambulatory Visit

## 2022-02-07 NOTE — Telephone Encounter (Signed)
Patient is schedule for 02/07/22 with Dr. Amalia Hailey

## 2022-02-07 NOTE — Telephone Encounter (Signed)
I contacted patient via phone. I was unable to leave message voicemail is full.

## 2022-02-08 ENCOUNTER — Encounter: Payer: Self-pay | Admitting: Obstetrics and Gynecology

## 2022-02-08 ENCOUNTER — Ambulatory Visit: Payer: Medicare Other | Admitting: Obstetrics and Gynecology

## 2022-02-08 VITALS — BP 145/94 | Ht 60.0 in | Wt 152.6 lb

## 2022-02-08 DIAGNOSIS — N939 Abnormal uterine and vaginal bleeding, unspecified: Secondary | ICD-10-CM | POA: Diagnosis not present

## 2022-02-08 DIAGNOSIS — Z7689 Persons encountering health services in other specified circumstances: Secondary | ICD-10-CM | POA: Diagnosis not present

## 2022-02-08 NOTE — Progress Notes (Signed)
Patient presents today to discuss abnormal bleeding since last July. She states she consistently used depo for 20-30 years and stopped last year and then the bleeding began. Patient states bleeding is heavy, lengthy and that she has back pain. History of abnormal pap smears, HPV+, has not followed up in a few years. No other questions at this time.

## 2022-02-08 NOTE — Progress Notes (Signed)
HPI:      Susan Gilmore is a 56 y.o. G1P1001 who LMP was No LMP recorded (lmp unknown). (Menstrual status: Other).  Subjective:   She presents today stating that she had been on Depo-Provera for approximately 30 years.  Last year she stopped Depo because she did not need any birth control.  Since that time she has had irregular bleeding sometimes skipping months and sometimes bleeding continuously.  She states that she is now in an episode of bleeding that has lasted for 3 weeks. Patient denies hot flashes or other signs of menopause. She reports that she had an abnormal Pap smear many years ago with positive HPV.  She says she has not had a Pap smear in many years, but it has not been more than 10.    Hx: The following portions of the patient's history were reviewed and updated as appropriate:             She  has a past medical history of Abnormal Pap smear (04/28/2012), Abnormal stress test, Allergic rhinitis, Bipolar disorder ([place]), Carpal tunnel syndrome, Chest pain, Chronic anxiety, Contraception, Cough, HPV (human papilloma virus) anogenital infection, HTN (hypertension), Hyperplastic colon polyp, Insomnia, Palpitations, and Pneumonia. She does not have any pertinent problems on file. She  has a past surgical history that includes Ganglion cyst excision; Wisdom tooth extraction; full mouth dental; and Cholecystectomy. Her family history includes Diabetes in her maternal grandmother and mother; Heart attack in her mother; Heart disease in her father, maternal grandfather, maternal grandmother, and paternal grandmother; Hyperlipidemia in her father; Hypertension in her father, maternal grandfather, and maternal grandmother; Stroke in her father and paternal grandmother. She  reports that she quit smoking about 8 years ago. Her smoking use included cigarettes. She has a 26.00 pack-year smoking history. She has never used smokeless tobacco. She reports that she does not drink alcohol and does  not use drugs. She has a current medication list which includes the following prescription(s): albuterol, alprazolam, amlodipine, amphetamine-dextroamphetamine, amphetamine-dextroamphetamine, aspirin ec, atorvastatin, breztri aerosphere, duloxetine, fluocinonide cream, fluticasone, fluzone quadrivalent, humira pen, hydrochlorothiazide, ibuprofen, ipratropium-albuterol, leflunomide, loratadine, losartan, montelukast, prednisone, and zolpidem. She is allergic to effexor [venlafaxine hcl] and isosorbide nitrate.       Review of Systems:  Review of Systems  Constitutional: Denied constitutional symptoms, night sweats, recent illness, fatigue, fever, insomnia and weight loss.  Eyes: Denied eye symptoms, eye pain, photophobia, vision change and visual disturbance.  Ears/Nose/Throat/Neck: Denied ear, nose, throat or neck symptoms, hearing loss, nasal discharge, sinus congestion and sore throat.  Cardiovascular: Denied cardiovascular symptoms, arrhythmia, chest pain/pressure, edema, exercise intolerance, orthopnea and palpitations.  Respiratory: Denied pulmonary symptoms, asthma, pleuritic pain, productive sputum, cough, dyspnea and wheezing.  Gastrointestinal: Denied, gastro-esophageal reflux, melena, nausea and vomiting.  Genitourinary: See HPI for additional information.  Musculoskeletal: Denied musculoskeletal symptoms, stiffness, swelling, muscle weakness and myalgia.  Dermatologic: Denied dermatology symptoms, rash and scar.  Neurologic: Denied neurology symptoms, dizziness, headache, neck pain and syncope.  Psychiatric: Denied psychiatric symptoms, anxiety and depression.  Endocrine: Denied endocrine symptoms including hot flashes and night sweats.   Meds:   Current Outpatient Medications on File Prior to Visit  Medication Sig Dispense Refill   albuterol (PROAIR HFA) 108 (90 Base) MCG/ACT inhaler Inhale 1-2 puffs into the lungs every 6 (six) hours as needed for wheezing or shortness of breath.  18 g 11   ALPRAZolam (XANAX) 1 MG tablet Take 1 mg by mouth 2 (two) times daily as needed for  anxiety.     amLODipine (NORVASC) 10 MG tablet Take 1 tablet (10 mg total) by mouth daily. 90 tablet 3   amphetamine-dextroamphetamine (ADDERALL) 20 MG tablet Take 20 mg by mouth 3 (three) times daily.     amphetamine-dextroamphetamine (ADDERALL) 30 MG tablet Take 1 tablet by mouth 2 (two) times daily.     aspirin EC 81 MG tablet Take 1 tablet (81 mg total) by mouth daily. 90 tablet 3   atorvastatin (LIPITOR) 40 MG tablet TAKE 1 TABLET BY MOUTH DAILY. KEEP UPCOMING APPOINTMENT FOR FUTURE REFILLS 90 tablet 1   Budeson-Glycopyrrol-Formoterol (BREZTRI AEROSPHERE) 160-9-4.8 MCG/ACT AERO Inhale 2 puffs into the lungs 2 (two) times daily. 10.7 g 11   DULoxetine (CYMBALTA) 60 MG capsule Take 120 mg by mouth daily.      fluocinonide cream (LIDEX) 0.05 %      fluticasone (FLONASE) 50 MCG/ACT nasal spray Place 2 sprays into both nostrils daily. (Patient taking differently: Place 2 sprays into both nostrils 2 (two) times daily.) 16 g 11   FLUZONE QUADRIVALENT 0.5 ML injection      HUMIRA PEN 40 MG/0.4ML PNKT      hydrochlorothiazide (HYDRODIURIL) 25 MG tablet TAKE 1 TABLET BY MOUTH EVERY DAY 90 tablet 0   ibuprofen (ADVIL) 800 MG tablet TAKE 1 TABLET(800 MG) BY MOUTH EVERY 8 HOURS AS NEEDED 30 tablet 0   ipratropium-albuterol (DUONEB) 0.5-2.5 (3) MG/3ML SOLN TAKE 3 MLS BY NEBULIZATION EVERY 6 (SIX) HOURS AS NEEDED (SHORTNESS OF BREATH). 360 mL 0   leflunomide (ARAVA) 20 MG tablet TAKE 1 TABLET(20 MG) BY MOUTH DAILY 90 tablet 0   loratadine (CLARITIN) 10 MG tablet Take 1 tablet by mouth daily as needed for allergies.      losartan (COZAAR) 100 MG tablet TAKE 1 TABLET(100 MG) BY MOUTH DAILY 90 tablet 1   montelukast (SINGULAIR) 10 MG tablet TAKE 1 TABLET(10 MG) BY MOUTH AT BEDTIME 90 tablet 1   predniSONE (DELTASONE) 5 MG tablet Take by mouth.     zolpidem (AMBIEN) 10 MG tablet Take 10 mg by mouth at bedtime.   0    No current facility-administered medications on file prior to visit.      Objective:     Vitals:   02/08/22 0849  BP: (!) 145/94   Filed Weights   02/08/22 0849  Weight: 152 lb 9.6 oz (69.2 kg)              Physical examination   Pelvic:   Vulva: Normal appearance.  No lesions.  Vagina: No lesions or abnormalities noted.  Support: Normal pelvic support.  Urethra No masses tenderness or scarring.  Meatus Normal size without lesions or prolapse.  Cervix: Normal appearance.  No lesions.  Anus: Normal exam.  No lesions.  Perineum: Normal exam.  No lesions.        Bimanual   Uterus: Normal size.  Non-tender.  Mobile.  AV.  Adnexae: No masses.  Non-tender to palpation.  Cul-de-sac: Negative for abnormality.             Assessment:    G1P1001 Patient Active Problem List   Diagnosis Date Noted   Abnormal vaginal bleeding 02/05/2022   Post-menopausal bleeding 07/05/2021   Hyperlipidemia 07/05/2021   Urinary frequency 07/05/2021   Chronic obstructive pulmonary disease ([place]) 09/08/2020   Current chronic use of systemic steroids 12/16/2019   Rheumatoid arthritis involving multiple sites ([place]) 12/16/2019   OSA on CPAP 05/03/2019   Coronary artery disease due to calcified  coronary lesion 10/23/2018   Reactive airway disease without complication 67/34/1937   Allergic rhinitis 03/15/2015   ADD (attention deficit disorder) 12/29/2014   Bipolar disorder ([place]) 12/29/2014   Carpal tunnel syndrome 12/29/2014   Chronic anxiety 12/29/2014   Acid reflux 12/29/2014   Benign neoplasm of colon 12/29/2014   Avitaminosis D 12/29/2014   Paroxysmal digital cyanosis 02/17/2014   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 Establishing care with new doctor, encounter for   2. Abnormal vaginal bleeding        Plan:            1.  FSH to rule in or rule out menopause  2.  Unable to do endometrial biopsy today because of heavy bleeding.  3.  Recommend  pelvic ultrasound as ordered to check for fibroids, polyps, and possibly endometrial thickness.  4.  Plan future Pap smear and mammography as part of routine care  5.  Strongly consider future hormonal cycle control. Orders No orders of the defined types were placed in this encounter.   No orders of the defined types were placed in this encounter.     F/U  Return in about 4 weeks (around 03/08/2022). I spent 32 minutes involved in the care of this patient preparing to see the patient by obtaining and reviewing her medical history (including labs, imaging tests and prior procedures), documenting clinical information in the electronic health record (EHR), counseling and coordinating care plans, writing and sending prescriptions, ordering tests or procedures and in direct communicating with the patient and medical staff discussing pertinent items from her history and physical exam.  Finis Bud, M.D. 02/08/2022 9:43 AM

## 2022-02-09 LAB — CBC WITH DIFF/PLATELET
Basophils Absolute: 0.1 10*3/uL (ref 0.0–0.2)
Basos: 2 %
EOS (ABSOLUTE): 0 10*3/uL (ref 0.0–0.4)
Eos: 0 %
Hematocrit: 39.8 % (ref 34.0–46.6)
Hemoglobin: 12.9 g/dL (ref 11.1–15.9)
Immature Grans (Abs): 0 10*3/uL (ref 0.0–0.1)
Immature Granulocytes: 0 %
Lymphocytes Absolute: 1.2 10*3/uL (ref 0.7–3.1)
Lymphs: 13 %
MCH: 27.7 pg (ref 26.6–33.0)
MCHC: 32.4 g/dL (ref 31.5–35.7)
MCV: 86 fL (ref 79–97)
Monocytes Absolute: 0.8 10*3/uL (ref 0.1–0.9)
Monocytes: 9 %
Neutrophils Absolute: 6.8 10*3/uL (ref 1.4–7.0)
Neutrophils: 76 %
Platelets: 182 10*3/uL (ref 150–450)
RBC: 4.65 x10E6/uL (ref 3.77–5.28)
RDW: 17 % — ABNORMAL HIGH (ref 11.7–15.4)
WBC: 9 10*3/uL (ref 3.4–10.8)

## 2022-02-09 LAB — FOLLICLE STIMULATING HORMONE: FSH: 19.6 m[IU]/mL

## 2022-02-10 NOTE — Progress Notes (Signed)
Your labs show that you are not yet in menopause.

## 2022-02-27 ENCOUNTER — Encounter: Payer: Self-pay | Admitting: Obstetrics and Gynecology

## 2022-03-19 ENCOUNTER — Ambulatory Visit
Admission: RE | Admit: 2022-03-19 | Discharge: 2022-03-19 | Disposition: A | Discharge status: Home / Self Care | Payer: Medicare Other | Source: Ambulatory Visit | Attending: Obstetrics and Gynecology | Admitting: Obstetrics and Gynecology

## 2022-03-19 DIAGNOSIS — N939 Abnormal uterine and vaginal bleeding, unspecified: Secondary | ICD-10-CM | POA: Insufficient documentation

## 2022-03-22 ENCOUNTER — Other Ambulatory Visit: Payer: Medicare Other

## 2022-05-02 ENCOUNTER — Other Ambulatory Visit: Payer: Self-pay | Admitting: Physician Assistant

## 2022-05-02 DIAGNOSIS — I10 Essential (primary) hypertension: Secondary | ICD-10-CM

## 2022-05-20 ENCOUNTER — Telehealth: Payer: Self-pay | Admitting: Obstetrics and Gynecology

## 2022-05-20 NOTE — Telephone Encounter (Signed)
Left message for patient to call office back to get f/u appointment set up in October with Dr Amalia Hailey.

## 2022-05-30 ENCOUNTER — Encounter: Payer: Self-pay | Admitting: Obstetrics and Gynecology

## 2022-05-30 NOTE — Telephone Encounter (Signed)
I contacted patient via phone. I left voicemail for patient to call back to be scheduled.  Multiple attempts to reach patient has been unsuccessful.

## 2022-06-04 NOTE — Telephone Encounter (Signed)
Patient is scheduled for 06/05/22 with Dr. Amalia Hailey

## 2022-06-05 ENCOUNTER — Encounter: Payer: Self-pay | Admitting: Obstetrics and Gynecology

## 2022-06-05 ENCOUNTER — Ambulatory Visit: Payer: Medicare Other | Admitting: Obstetrics and Gynecology

## 2022-06-05 ENCOUNTER — Telehealth: Payer: Self-pay | Admitting: Obstetrics and Gynecology

## 2022-06-05 VITALS — BP 152/80 | HR 82 | Ht 60.0 in | Wt 153.8 lb

## 2022-06-05 DIAGNOSIS — Z30011 Encounter for initial prescription of contraceptive pills: Secondary | ICD-10-CM

## 2022-06-05 DIAGNOSIS — N939 Abnormal uterine and vaginal bleeding, unspecified: Secondary | ICD-10-CM

## 2022-06-05 MED ORDER — LEVONORGEST-ETH ESTRAD 91-DAY 0.15-0.03 &0.01 MG PO TABS: 1.0000 | ORAL_TABLET | Freq: Every day | ORAL | 3 refills | Status: DC

## 2022-06-05 NOTE — Progress Notes (Signed)
HPI:      [name] is a 56 y.o. G1P1001 who LMP was No LMP recorded. (Menstrual status: Other).  Subjective:   She presents today to discuss her [facility] results and tell us about her bleeding.  She reports that she has bled on and off sometimes heavily since her last visit.  Her [place] shows that she is not yet in menopause.  Prior to this irregular bleeding she was on Depo-Provera for many many years with no bleeding. (Low risk for endometrial cancer)    Hx: The following portions of the patient's history were reviewed and updated as appropriate:             She  has a past medical history of Abnormal Pap smear (04/28/2012), Abnormal stress test, Allergic rhinitis, Bipolar disorder ([place]), Carpal tunnel syndrome, Chest pain, Chronic anxiety, Contraception, Cough, HPV (human papilloma virus) anogenital infection, HTN (hypertension), Hyperplastic colon polyp, Insomnia, Palpitations, and Pneumonia. She does not have any pertinent problems on file. She  has a past surgical history that includes Ganglion cyst excision; Wisdom tooth extraction; full mouth dental; and Cholecystectomy. Her family history includes Diabetes in her maternal grandmother and mother; Heart attack in her mother; Heart disease in her father, maternal grandfather, maternal grandmother, and paternal grandmother; Hyperlipidemia in her father; Hypertension in her father, maternal grandfather, and maternal grandmother; Stroke in her father and paternal grandmother. She  reports that she quit smoking about 8 years ago. Her smoking use included cigarettes. She has a 26.00 pack-year smoking history. She has never used smokeless tobacco. She reports that she does not drink alcohol and does not use drugs. She has a current medication list which includes the following prescription(s): albuterol, alprazolam, amlodipine, amphetamine-dextroamphetamine, amphetamine-dextroamphetamine, aspirin ec, atorvastatin, breztri aerosphere, duloxetine,  fluocinonide cream, fluticasone, fluzone quadrivalent, humira pen, hydrochlorothiazide, ibuprofen, ipratropium-albuterol, leflunomide, loratadine, losartan, montelukast, prednisone, and zolpidem. She is allergic to effexor [venlafaxine hcl] and isosorbide nitrate.       Review of Systems:  Review of Systems  Constitutional: Denied constitutional symptoms, night sweats, recent illness, fatigue, fever, insomnia and weight loss.  Eyes: Denied eye symptoms, eye pain, photophobia, vision change and visual disturbance.  Ears/Nose/Throat/Neck: Denied ear, nose, throat or neck symptoms, hearing loss, nasal discharge, sinus congestion and sore throat.  Cardiovascular: Denied cardiovascular symptoms, arrhythmia, chest pain/pressure, edema, exercise intolerance, orthopnea and palpitations.  Respiratory: Denied pulmonary symptoms, asthma, pleuritic pain, productive sputum, cough, dyspnea and wheezing.  Gastrointestinal: Denied, gastro-esophageal reflux, melena, nausea and vomiting.  Genitourinary: Denied genitourinary symptoms including symptomatic vaginal discharge, pelvic relaxation issues, and urinary complaints.  Musculoskeletal: Denied musculoskeletal symptoms, stiffness, swelling, muscle weakness and myalgia.  Dermatologic: Denied dermatology symptoms, rash and scar.  Neurologic: Denied neurology symptoms, dizziness, headache, neck pain and syncope.  Psychiatric: Denied psychiatric symptoms, anxiety and depression.  Endocrine: Denied endocrine symptoms including hot flashes and night sweats.   Meds:   Current Outpatient Medications on File Prior to Visit  Medication Sig Dispense Refill   albuterol (PROAIR HFA) 108 (90 Base) MCG/ACT inhaler Inhale 1-2 puffs into the lungs every 6 (six) hours as needed for wheezing or shortness of breath. 18 g 11   ALPRAZolam (XANAX) 1 MG tablet Take 1 mg by mouth 2 (two) times daily as needed for anxiety.     amLODipine (NORVASC) 10 MG tablet Take 1 tablet (10 mg  total) by mouth daily. 90 tablet 3   amphetamine-dextroamphetamine (ADDERALL) 20 MG tablet Take 20 mg by mouth 3 (three) times daily.  amphetamine-dextroamphetamine (ADDERALL) 30 MG tablet Take 1 tablet by mouth 2 (two) times daily.     aspirin EC 81 MG tablet Take 1 tablet (81 mg total) by mouth daily. 90 tablet 3   atorvastatin (LIPITOR) 40 MG tablet TAKE 1 TABLET BY MOUTH DAILY. KEEP UPCOMING APPOINTMENT FOR FUTURE REFILLS 90 tablet 1   Budeson-Glycopyrrol-Formoterol (BREZTRI AEROSPHERE) 160-9-4.8 MCG/ACT AERO Inhale 2 puffs into the lungs 2 (two) times daily. 10.7 g 11   DULoxetine (CYMBALTA) 60 MG capsule Take 120 mg by mouth daily.      fluocinonide cream (LIDEX) 0.05 %      fluticasone (FLONASE) 50 MCG/ACT nasal spray Place 2 sprays into both nostrils daily. (Patient taking differently: Place 2 sprays into both nostrils 2 (two) times daily.) 16 g 11   FLUZONE QUADRIVALENT 0.5 ML injection      HUMIRA PEN 40 MG/0.4ML PNKT      hydrochlorothiazide (HYDRODIURIL) 25 MG tablet TAKE 1 TABLET BY MOUTH EVERY DAY 90 tablet 0   ibuprofen (ADVIL) 800 MG tablet TAKE 1 TABLET(800 MG) BY MOUTH EVERY 8 HOURS AS NEEDED 30 tablet 0   ipratropium-albuterol (DUONEB) 0.5-2.5 (3) MG/3ML SOLN TAKE 3 MLS BY NEBULIZATION EVERY 6 (SIX) HOURS AS NEEDED (SHORTNESS OF BREATH). 360 mL 0   leflunomide (ARAVA) 20 MG tablet TAKE 1 TABLET(20 MG) BY MOUTH DAILY 90 tablet 0   loratadine (CLARITIN) 10 MG tablet Take 1 tablet by mouth daily as needed for allergies.      losartan (COZAAR) 100 MG tablet TAKE 1 TABLET(100 MG) BY MOUTH DAILY 90 tablet 1   montelukast (SINGULAIR) 10 MG tablet TAKE 1 TABLET(10 MG) BY MOUTH AT BEDTIME 90 tablet 1   predniSONE (DELTASONE) 5 MG tablet Take by mouth.     zolpidem (AMBIEN) 10 MG tablet Take 10 mg by mouth at bedtime.   0   No current facility-administered medications on file prior to visit.      Objective:     Vitals:   06/05/22 1542  BP: (!) 152/80  Pulse: 82   Filed  Weights   06/05/22 1542  Weight: 153 lb 12.8 oz (69.8 kg)                        Assessment:    G1P1001 Patient Active Problem List   Diagnosis Date Noted   Abnormal vaginal bleeding 02/05/2022   Post-menopausal bleeding 07/05/2021   Hyperlipidemia 07/05/2021   Urinary frequency 07/05/2021   Chronic obstructive pulmonary disease ([place]) 09/08/2020   Current chronic use of systemic steroids 12/16/2019   Rheumatoid arthritis involving multiple sites ([place]) 12/16/2019   OSA on CPAP 05/03/2019   Coronary artery disease due to calcified coronary lesion 10/23/2018   Reactive airway disease without complication 59/16/3846   Allergic rhinitis 03/15/2015   ADD (attention deficit disorder) 12/29/2014   Bipolar disorder ([place]) 12/29/2014   Carpal tunnel syndrome 12/29/2014   Chronic anxiety 12/29/2014   Acid reflux 12/29/2014   Benign neoplasm of colon 12/29/2014   Avitaminosis D 12/29/2014   Paroxysmal digital cyanosis 02/17/2014   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 Abnormal vaginal bleeding   2. Initiation of OCP (BCP)     We discussed possible endometrial biopsy.  Patient does not want to do this today.  We discussed dysfunctional uterine bleeding and cycle control methods.  She would prefer OCPs and cycling every 3 months.  This way at the end of 1 year  we can recheck her [facility] for menopause.   Plan:            1.  [place] for cycle control.  Patient has been instructed that if she has irregular bleeding other than at the end of each pack she is to inform us and endometrial biopsy will be warranted. Standard OCPs  2.  If she continues with cycle control recommend stopping OCPs and checking FSH at the end of the year.  3.  Patient to return in 6 weeks when not bleeding for annual examination Pap smear mammography etc. Orders No orders of the defined types were placed in this encounter.   No orders of the defined types were placed in this  encounter.     F/U  Return in about 6 weeks (around 07/17/2022) for Annual Physical. I spent 23 minutes involved in the care of this patient preparing to see the patient by obtaining and reviewing her medical history (including labs, imaging tests and prior procedures), documenting clinical information in the electronic health record (EHR), counseling and coordinating care plans, writing and sending prescriptions, ordering tests or procedures and in direct communicating with the patient and medical staff discussing pertinent items from her history and physical exam.   Finis Bud, M.D. 06/05/2022 4:20 PM

## 2022-06-05 NOTE — Progress Notes (Signed)
Patient presents today to discuss ultrasound findings. She states her bleeding subsided yesterday. No additional concerns.

## 2022-06-05 NOTE — Telephone Encounter (Signed)
Reached out to pt to schedule annual (needed around 07/17/22) with Dr. Amalia Hailey.  Left message for pt to call back to schedule.

## 2022-06-06 ENCOUNTER — Other Ambulatory Visit: Payer: Self-pay | Admitting: Physician Assistant

## 2022-06-06 DIAGNOSIS — I10 Essential (primary) hypertension: Secondary | ICD-10-CM

## 2022-06-07 NOTE — Telephone Encounter (Signed)
Unable to refill per protocol, last refill 01/29/22 for 90 and 1 RF, request is too soon.E-Prescribing Status: Receipt confirmed by pharmacy (01/29/2022 3:15 PM EDT). Will refuse.  Requested Prescriptions  Pending Prescriptions Disp Refills  . losartan (COZAAR) 100 MG tablet [Pharmacy Med Name: LOSARTAN '100MG'$  TABLETS] 90 tablet 1    Sig: TAKE 1 TABLET(100 MG) BY MOUTH DAILY     Cardiovascular:  Angiotensin Receptor Blockers Failed - 06/06/2022  2:33 PM      Failed - Last BP in normal range    BP Readings from Last 1 Encounters:  06/05/22 (!) 152/80         Passed - Cr in normal range and within 180 days    Creat  Date Value Ref Range Status  06/12/2017 0.94 0.50 - 1.10 mg/dL Final   Creatinine, Ser  Date Value Ref Range Status  02/05/2022 0.82 0.57 - 1.00 mg/dL Final         Passed - K in normal range and within 180 days    Potassium  Date Value Ref Range Status  02/05/2022 4.5 3.5 - 5.2 mmol/L Final  06/22/2014 3.6 3.5 - 5.1 mmol/L Final         Passed - Patient is not pregnant      Passed - Valid encounter within last 6 months    Recent Outpatient Visits          4 months ago Abnormal vaginal bleeding   [facility] [name], PA-C   8 months ago Chronic obstructive pulmonary disease, unspecified COPD type ([place])   [facility] [name], PA-C   11 months ago Post-menopausal bleeding   [facility] [name], PA-C   1 year ago Hypertension, unspecified type   [facility] [address], Clearnce Sorrel, [place]   2 years ago Encounter for surveillance of injectable contraceptive   [facility] [place], Dionne Bucy, MD

## 2022-06-12 ENCOUNTER — Ambulatory Visit (INDEPENDENT_AMBULATORY_CARE_PROVIDER_SITE_OTHER): Payer: Medicare Other | Admitting: Physician Assistant

## 2022-06-12 ENCOUNTER — Encounter: Payer: Self-pay | Admitting: Physician Assistant

## 2022-06-12 VITALS — BP 145/74 | HR 78 | Ht 61.0 in | Wt 155.5 lb

## 2022-06-12 DIAGNOSIS — J449 Chronic obstructive pulmonary disease, unspecified: Secondary | ICD-10-CM | POA: Diagnosis not present

## 2022-06-12 DIAGNOSIS — M05711 Rheumatoid arthritis with rheumatoid factor of right shoulder without organ or systems involvement: Secondary | ICD-10-CM | POA: Diagnosis not present

## 2022-06-12 DIAGNOSIS — M069 Rheumatoid arthritis, unspecified: Secondary | ICD-10-CM

## 2022-06-12 DIAGNOSIS — I10 Essential (primary) hypertension: Secondary | ICD-10-CM | POA: Diagnosis not present

## 2022-06-12 DIAGNOSIS — E785 Hyperlipidemia, unspecified: Secondary | ICD-10-CM

## 2022-06-12 DIAGNOSIS — Z23 Encounter for immunization: Secondary | ICD-10-CM

## 2022-06-12 DIAGNOSIS — Z1231 Encounter for screening mammogram for malignant neoplasm of breast: Secondary | ICD-10-CM

## 2022-06-12 DIAGNOSIS — Z0000 Encounter for general adult medical examination without abnormal findings: Secondary | ICD-10-CM

## 2022-06-12 DIAGNOSIS — Z1211 Encounter for screening for malignant neoplasm of colon: Secondary | ICD-10-CM

## 2022-06-12 DIAGNOSIS — F319 Bipolar disorder, unspecified: Secondary | ICD-10-CM

## 2022-06-12 MED ORDER — ALBUTEROL SULFATE HFA 108 (90 BASE) MCG/ACT IN AERS: 1.0000 | INHALATION_SPRAY | Freq: Four times a day (QID) | RESPIRATORY_TRACT | 3 refills | Status: DC | PRN

## 2022-06-12 MED ORDER — HYDROCHLOROTHIAZIDE 12.5 MG PO CAPS: 12.5000 mg | ORAL_CAPSULE | Freq: Every day | ORAL | 1 refills | Status: DC

## 2022-06-12 MED ORDER — BREZTRI AEROSPHERE 160-9-4.8 MCG/ACT IN AERO: 2.0000 | INHALATION_SPRAY | Freq: Two times a day (BID) | RESPIRATORY_TRACT | 3 refills | Status: DC

## 2022-06-12 MED ORDER — BACLOFEN 10 MG PO TABS: 10.0000 mg | ORAL_TABLET | Freq: Every day | ORAL | 0 refills | Status: DC | PRN

## 2022-06-12 NOTE — Patient Instructions (Signed)
Please contact [phone] to schedule your mammogram. They will ask which location you prefer to be seen in. You have two options listed below.  1) [facility] located at [address] [place], [place] [address] 2) [facility] [place] located at [address] [place], [place] [address]  Please feel free to contact us if you have any further questions or concerns  

## 2022-06-12 NOTE — Assessment & Plan Note (Signed)
On statin Will repeat fasting lipids/cmp

## 2022-06-12 NOTE — Assessment & Plan Note (Signed)
F/b psychiatry.  Pt takes ambien, prn xanax, cymbalta

## 2022-06-12 NOTE — Assessment & Plan Note (Addendum)
Pt follows w/ rheum, knows she needs a f/u appt  She has used a muscle relaxant prn for RA related pain  Sent in #30 baclofen advised not to take w/ ambien or xanax

## 2022-06-12 NOTE — Assessment & Plan Note (Signed)
Pt managing with losartan 100 mg, amlodipine 10 mg -- was taking hctz 25 mg but pt states she hasnt taken in a long time Adding back hctz but at 12.5 mg  F/u 6-8 weeks bp  Advised to monitor at home

## 2022-06-12 NOTE — Assessment & Plan Note (Signed)
Increase in symptoms since running out of breztri Refilled, refilled albuterol inhaler.  Pt uses ipratropium-albuterol nebs prn  Advised adding mucinex if increase in cough/chest congestion

## 2022-06-12 NOTE — Progress Notes (Signed)
I,[name],acting as a Education administrator for [employer], PA-C.,have documented all relevant documentation on the behalf of Susan Kirschner, PA-C,as directed by  Susan Kirschner, PA-C while in the presence of Susan Kirschner, PA-C.   Annual Wellness Visit     Patient: Susan Gilmore, Female    DOB: [birth year], 56 y.o.   MRN: [id number] Visit Date: 06/12/2022  Today's Provider: [name], PA-C   Cc. awv  Subjective    Brenley EURYDICE Gilmore is a 56 y.o. female who presents today for her Annual Wellness Visit. She reports consuming a general diet. The patient does not participate in regular exercise at present. She generally feels fairly well. She reports sleeping poorly since father passed on 05-14-22. She does not have additional problems to discuss today.   HPI  She has run out of her breztri inhaler-- noticed a significant difference since she has stopped. Increase in productive cough.  She reports rheumatology manages her RA but she needs to make a f/u appointment, she has used muscle relaxants in the past for RA joint pain and they have helped, wondering if we can prescribe.    Medications: Outpatient Medications Prior to Visit  Medication Sig   ALPRAZolam (XANAX) 1 MG tablet Take 1 mg by mouth 2 (two) times daily as needed for anxiety.   amLODipine (NORVASC) 10 MG tablet Take 1 tablet (10 mg total) by mouth daily.   amphetamine-dextroamphetamine (ADDERALL) 20 MG tablet Take 20 mg by mouth 3 (three) times daily.   amphetamine-dextroamphetamine (ADDERALL) 30 MG tablet Take 1 tablet by mouth 2 (two) times daily.   aspirin EC 81 MG tablet Take 1 tablet (81 mg total) by mouth daily.   atorvastatin (LIPITOR) 40 MG tablet TAKE 1 TABLET BY MOUTH DAILY. KEEP UPCOMING APPOINTMENT FOR FUTURE REFILLS   DULoxetine (CYMBALTA) 60 MG capsule Take 120 mg by mouth daily.    fluocinonide cream (LIDEX) 0.05 %    fluticasone (FLONASE) 50 MCG/ACT nasal spray Place 2 sprays into both nostrils  daily. (Patient taking differently: Place 2 sprays into both nostrils 2 (two) times daily.)   FLUZONE QUADRIVALENT 0.5 ML injection    HUMIRA PEN 40 MG/0.4ML PNKT    ipratropium-albuterol (DUONEB) 0.5-2.5 (3) MG/3ML SOLN TAKE 3 MLS BY NEBULIZATION EVERY 6 (SIX) HOURS AS NEEDED (SHORTNESS OF BREATH).   leflunomide (ARAVA) 20 MG tablet TAKE 1 TABLET(20 MG) BY MOUTH DAILY   Levonorgestrel-Ethinyl Estradiol (AMETHIA) 0.15-0.03 &0.01 MG tablet Take 1 tablet by mouth at bedtime.   loratadine (CLARITIN) 10 MG tablet Take 1 tablet by mouth daily as needed for allergies.    losartan (COZAAR) 100 MG tablet TAKE 1 TABLET(100 MG) BY MOUTH DAILY   montelukast (SINGULAIR) 10 MG tablet TAKE 1 TABLET(10 MG) BY MOUTH AT BEDTIME   predniSONE (DELTASONE) 5 MG tablet Take by mouth.   zolpidem (AMBIEN) 10 MG tablet Take 10 mg by mouth at bedtime.    [DISCONTINUED] albuterol (PROAIR HFA) 108 (90 Base) MCG/ACT inhaler Inhale 1-2 puffs into the lungs every 6 (six) hours as needed for wheezing or shortness of breath.   [DISCONTINUED] Budeson-Glycopyrrol-Formoterol (BREZTRI AEROSPHERE) 160-9-4.8 MCG/ACT AERO Inhale 2 puffs into the lungs 2 (two) times daily.   ibuprofen (ADVIL) 800 MG tablet TAKE 1 TABLET(800 MG) BY MOUTH EVERY 8 HOURS AS NEEDED (Patient not taking: Reported on 06/12/2022)   [DISCONTINUED] hydrochlorothiazide (HYDRODIURIL) 25 MG tablet TAKE 1 TABLET BY MOUTH EVERY DAY (Patient not taking: Reported on 06/12/2022)   No facility-administered medications prior to  visit.    Allergies  Allergen Reactions   Effexor [Venlafaxine Hcl] Swelling   Isosorbide Nitrate Other (See Comments)    Reaction: Bad headache    Patient Care Team: Susan Kirschner, PA-C as PCP - General (Physician Assistant) [name], MD as PCP - Cardiology (Cardiology) Chucky May, MD as Consulting Physician (Psychiatry) Quintin Alto, MD as Consulting Physician (Rheumatology)     Objective    Blood pressure (!)  145/74, pulse 78, height '5\' 1"'$  (1.549 m), weight 155 lb 8 oz (70.5 kg), SpO2 100 %.    Physical Exam Constitutional:      General: She is awake.     Appearance: She is well-developed.  HENT:     Head: Normocephalic.  Eyes:     Conjunctiva/sclera: Conjunctivae normal.  Cardiovascular:     Rate and Rhythm: Normal rate and regular rhythm.     Heart sounds: Normal heart sounds.  Pulmonary:     Effort: Pulmonary effort is normal.     Breath sounds: Normal breath sounds.  Musculoskeletal:     Right lower leg: No edema.     Left lower leg: No edema.  Skin:    General: Skin is warm.  Neurological:     Mental Status: She is alert and oriented to person, place, and time.  Psychiatric:        Attention and Perception: Attention normal.        Mood and Affect: Mood normal.        Speech: Speech normal.        Behavior: Behavior is cooperative.    Most recent functional status assessment:    02/05/2022    8:30 AM  In your present state of health, do you have any difficulty performing the following activities:  Hearing? 0  Vision? 1  Comment sometimes  Comment sometimes  Dressing or bathing? 0  Doing errands, shopping? 0   Most recent fall risk assessment:    06/12/2022    3:44 PM  [place] in the past year? 0  Number falls in past yr: 0  Injury with Fall? 0    Most recent depression screenings:    02/05/2022    8:30 AM 10/08/2021    8:15 AM  PHQ 2/9 Scores  PHQ - 2 Score 0 2  PHQ- 9 Score 6 10   Most recent cognitive screening:    02/13/2017    1:24 PM  6CIT Screen  What Year? 0 points  What month? 0 points  What time? 0 points  Count back from 20 0 points  Months in reverse 0 points  Repeat phrase 10 points  Total Score 10 points   Most recent Audit-C alcohol use screening    02/05/2022    8:30 AM  Alcohol Use Disorder Test (AUDIT)  1. How often do you have a drink containing alcohol? 0  2. How many drinks containing alcohol do you have on a  typical day when you are drinking? 0  3. How often do you have six or more drinks on one occasion? 0  AUDIT-C Score 0   A score of 3 or more in women, and 4 or more in men indicates increased risk for alcohol abuse, EXCEPT if all of the points are from question 1   No results found for any visits on 06/12/22.  Assessment & Plan     Annual wellness visit done today including the all of the following: Reviewed patient's Family Medical  History Reviewed and updated list of patient's medical providers Assessment of cognitive impairment was done Assessed patient's functional ability Established a written schedule for health screening [place] Completed and Reviewed  Exercise Activities and Dietary recommendations --balanced diet high in fiber and protein, low in sugars, carbs, fats. --physical activity/exercise 30 minutes 3-5 times a week    Immunization History  Administered Date(s) Administered   Influenza, Seasonal, Injecte, Preservative Fre 06/04/2017   Influenza,inj,Quad PF,6+ Mos 06/05/2015, 07/19/2019, 05/05/2020, 07/05/2021, 06/12/2022   Influenza-Unspecified 06/01/2014, 06/01/2018   Pneumococcal Conjugate-13 06/05/2015   Pneumococcal Polysaccharide-23 07/17/2012, 09/08/2020   Td 07/12/2004   Tdap 09/28/2010    Health Maintenance  Topic Date Due   COVID-19 Vaccine (1) Never done   Zoster Vaccines- Shingrix (1 of 2) Never done   Fecal DNA (Cologuard)  Never done   TETANUS/TDAP  09/28/2020   PAP SMEAR-Modifier  06/19/2022 (Originally 03/05/2018)   MAMMOGRAM  10/08/2022 (Originally 09/21/1985)   INFLUENZA VACCINE  Completed   HIV Screening  Completed   HPV VACCINES  Aged Out     Discussed health benefits of physical activity, and encouraged her to engage in regular exercise appropriate for her age and condition.    Problem List Items Addressed This Visit       Cardiovascular and Mediastinum   Hypertension    Pt managing with losartan 100 mg,  amlodipine 10 mg -- was taking hctz 25 mg but pt states she hasnt taken in a long time Adding back hctz but at 12.5 mg  F/u 6-8 weeks bp  Advised to monitor at home      Relevant Medications   hydrochlorothiazide (MICROZIDE) 12.5 MG capsule   Other Relevant Orders   TSH   Comprehensive Metabolic Panel (CMET)     Respiratory   Chronic obstructive pulmonary disease (HCC)    Increase in symptoms since running out of breztri Refilled, refilled albuterol inhaler.  Pt uses ipratropium-albuterol nebs prn  Advised adding mucinex if increase in cough/chest congestion       Relevant Medications   Budeson-Glycopyrrol-Formoterol (BREZTRI AEROSPHERE) 160-9-4.8 MCG/ACT AERO   albuterol (PROAIR HFA) 108 (90 Base) MCG/ACT inhaler     Musculoskeletal and Integument   Rheumatoid arthritis involving multiple sites ([place])    Pt follows w/ rheum, knows she needs a f/u appt  She has used a muscle relaxant prn for RA related pain  Sent in #30 baclofen advised not to take w/ ambien or xanax      Relevant Medications   baclofen (LIORESAL) 10 MG tablet     Other   Bipolar disorder ([place])    F/b psychiatry.  Pt takes ambien, prn xanax, cymbalta      Hyperlipidemia    On statin Will repeat fasting lipids/cmp      Relevant Medications   hydrochlorothiazide (MICROZIDE) 12.5 MG capsule   Other Relevant Orders   Lipid Profile   Comprehensive Metabolic Panel (CMET)   Other Visit Diagnoses     Medicare annual wellness visit, subsequent    -  Primary   Annual physical exam       Rheumatoid arthritis involving right shoulder with positive rheumatoid factor (HCC)       Relevant Medications   baclofen (LIORESAL) 10 MG tablet   Flu vaccine need       Relevant Orders   Flu Vaccine QUAD 6+ mos PF IM (Fluarix Quad PF) (Completed)   Colon cancer screening       Relevant Orders  Cologuard   Breast cancer screening by mammogram       Relevant Orders   MM 3D SCREEN BREAST BILATERAL      Pt  will f/u with GYN for pap next month.  Advised we do her updated Td vaccine at next f/u Return in about 6 weeks (around 07/24/2022) for hypertension.     I, Susan Kirschner, PA-C have reviewed all documentation for this visit. The documentation on  06/12/2022 for the exam, diagnosis, procedures, and orders are all accurate and complete.  Susan Kirschner, PA-C [facility] [address] [place], [place], [address] Office: [phone] Fax: [place]

## 2022-06-13 LAB — LIPID PANEL
Chol/HDL Ratio: 2.8 ratio (ref 0.0–4.4)
Cholesterol, Total: 135 mg/dL (ref 100–199)
HDL: 49 mg/dL (ref >39)
LDL Chol Calc (NIH): 68 mg/dL (ref 0–99)
Triglycerides: 99 mg/dL (ref 0–149)
VLDL Cholesterol Cal: 18 mg/dL (ref 5–40)

## 2022-06-13 LAB — COMPREHENSIVE METABOLIC PANEL
ALT: 16 IU/L (ref 0–32)
AST: 18 IU/L (ref 0–40)
Albumin/Globulin Ratio: 2 (ref 1.2–2.2)
Albumin: 4.2 g/dL (ref 3.8–4.9)
Alkaline Phosphatase: 124 IU/L — ABNORMAL HIGH (ref 44–121)
BUN/Creatinine Ratio: 10 (ref 9–23)
BUN: 8 mg/dL (ref 6–24)
Bilirubin Total: 0.2 mg/dL (ref 0.0–1.2)
CO2: 20 mmol/L (ref 20–29)
Calcium: 8.6 mg/dL — ABNORMAL LOW (ref 8.7–10.2)
Chloride: 103 mmol/L (ref 96–106)
Creatinine, Ser: 0.81 mg/dL (ref 0.57–1.00)
Globulin, Total: 2.1 g/dL (ref 1.5–4.5)
Glucose: 95 mg/dL (ref 70–99)
Potassium: 4.2 mmol/L (ref 3.5–5.2)
Sodium: 140 mmol/L (ref 134–144)
Total Protein: 6.3 g/dL (ref 6.0–8.5)
eGFR: 86 mL/min/{1.73_m2} (ref >59)

## 2022-06-13 LAB — TSH: TSH: 5.19 u[IU]/mL — ABNORMAL HIGH (ref 0.450–4.500)

## 2022-06-14 ENCOUNTER — Encounter: Payer: Self-pay | Admitting: Obstetrics and Gynecology

## 2022-06-17 ENCOUNTER — Other Ambulatory Visit: Payer: Self-pay | Admitting: Physician Assistant

## 2022-06-17 DIAGNOSIS — R7989 Other specified abnormal findings of blood chemistry: Secondary | ICD-10-CM

## 2022-06-18 LAB — SPECIMEN STATUS REPORT

## 2022-06-18 LAB — T4, FREE: Free T4: 0.94 ng/dL (ref 0.82–1.77)

## 2022-06-20 NOTE — Telephone Encounter (Signed)
I contacted patient via phone. I left voicemail for patient to call back to be scheduled.   

## 2022-07-11 NOTE — Telephone Encounter (Signed)
I contacted patient via phone. I left voicemail for patient to call back to be scheduled.   

## 2022-08-12 ENCOUNTER — Ambulatory Visit: Payer: Medicare Other | Admitting: Physician Assistant

## 2022-08-12 NOTE — Progress Notes (Deleted)
    Established patient visit   Patient: Susan Gilmore   DOB: 04/28/1966   56 y.o. Female  MRN: [id number] Visit Date: 08/12/2022  Today's healthcare provider: Lindsay Drubel, PA-C   No chief complaint on file.  Subjective    HPI  Hypertension, follow-up  BP Readings from Last 3 Encounters:  06/12/22 (!) 145/74  06/05/22 (!) 152/80  02/08/22 (!) 145/94   Wt Readings from Last 3 Encounters:  06/12/22 155 lb 8 oz (70.5 kg)  06/05/22 153 lb 12.8 oz (69.8 kg)  02/08/22 152 lb 9.6 oz (69.2 kg)     She was last seen for hypertension 6 months ago.  BP at that visit was 145/94. Management since that visit includes monitoring at home.  She reports {excellent/good/fair/poor:19665} compliance with treatment. She {is/is not:9024} having side effects. {document side effects if present:1} She is following a {diet:21022986} diet. She {is/is not:9024} exercising. She {does/does not:200015} smoke.  Use of agents associated with hypertension: {bp agents assoc with hypertension:511::"none"}.   Outside blood pressures are {***enter patient reported home BP readings, or 'not being checked':1}. Symptoms: {Yes/No:20286} chest pain {Yes/No:20286} chest pressure  {Yes/No:20286} palpitations {Yes/No:20286} syncope  {Yes/No:20286} dyspnea {Yes/No:20286} orthopnea  {Yes/No:20286} paroxysmal nocturnal dyspnea {Yes/No:20286} lower extremity edema   Pertinent labs Lab Results  Component Value Date   CHOL 135 06/12/2022   HDL 49 06/12/2022   LDLCALC 68 06/12/2022   TRIG 99 06/12/2022   CHOLHDL 2.8 06/12/2022   Lab Results  Component Value Date   NA 140 06/12/2022   K 4.2 06/12/2022   CREATININE 0.81 06/12/2022   EGFR 86 06/12/2022   GLUCOSE 95 06/12/2022   TSH 5.190 (H) 06/12/2022     The 10-year ASCVD risk score (Arnett DK, et al., 2019) is: 2.3%  ---------------------------------------------------------------------------------------------------   Medications: Outpatient  Medications Prior to Visit  Medication Sig   albuterol (PROAIR HFA) 108 (90 Base) MCG/ACT inhaler Inhale 1-2 puffs into the lungs every 6 (six) hours as needed for wheezing or shortness of breath.   ALPRAZolam (XANAX) 1 MG tablet Take 1 mg by mouth 2 (two) times daily as needed for anxiety.   amLODipine (NORVASC) 10 MG tablet Take 1 tablet (10 mg total) by mouth daily.   amphetamine-dextroamphetamine (ADDERALL) 20 MG tablet Take 20 mg by mouth 3 (three) times daily.   amphetamine-dextroamphetamine (ADDERALL) 30 MG tablet Take 1 tablet by mouth 2 (two) times daily.   aspirin EC 81 MG tablet Take 1 tablet (81 mg total) by mouth daily.   atorvastatin (LIPITOR) 40 MG tablet TAKE 1 TABLET BY MOUTH DAILY. KEEP UPCOMING APPOINTMENT FOR FUTURE REFILLS   baclofen (LIORESAL) 10 MG tablet Take 1 tablet (10 mg total) by mouth daily as needed for muscle spasms.   Budeson-Glycopyrrol-Formoterol (BREZTRI AEROSPHERE) 160-9-4.8 MCG/ACT AERO Inhale 2 puffs into the lungs 2 (two) times daily.   DULoxetine (CYMBALTA) 60 MG capsule Take 120 mg by mouth daily.    fluocinonide cream (LIDEX) 0.05 %    fluticasone (FLONASE) 50 MCG/ACT nasal spray Place 2 sprays into both nostrils daily. (Patient taking differently: Place 2 sprays into both nostrils 2 (two) times daily.)   FLUZONE QUADRIVALENT 0.5 ML injection    HUMIRA PEN 40 MG/0.4ML PNKT    hydrochlorothiazide (MICROZIDE) 12.5 MG capsule Take 1 capsule (12.5 mg total) by mouth daily.   ibuprofen (ADVIL) 800 MG tablet TAKE 1 TABLET(800 MG) BY MOUTH EVERY 8 HOURS AS NEEDED (Patient not taking: Reported on 06/12/2022)   ipratropium-albuterol (DUONEB)   0.5-2.5 (3) MG/3ML SOLN TAKE 3 MLS BY NEBULIZATION EVERY 6 (SIX) HOURS AS NEEDED (SHORTNESS OF BREATH).   leflunomide (ARAVA) 20 MG tablet TAKE 1 TABLET(20 MG) BY MOUTH DAILY   Levonorgestrel-Ethinyl Estradiol (AMETHIA) 0.15-0.03 &0.01 MG tablet Take 1 tablet by mouth at bedtime.   loratadine (CLARITIN) 10 MG tablet Take 1  tablet by mouth daily as needed for allergies.    losartan (COZAAR) 100 MG tablet TAKE 1 TABLET(100 MG) BY MOUTH DAILY   montelukast (SINGULAIR) 10 MG tablet TAKE 1 TABLET(10 MG) BY MOUTH AT BEDTIME   predniSONE (DELTASONE) 5 MG tablet Take by mouth.   zolpidem (AMBIEN) 10 MG tablet Take 10 mg by mouth at bedtime.    No facility-administered medications prior to visit.    Review of Systems  {Labs  Heme  Chem  Endocrine  Serology  Results Review (optional):23779}   Objective    There were no vitals taken for this visit. {Show previous vital signs (optional):23777}  Physical Exam  ***  No results found for any visits on 08/12/22.  Assessment & Plan     ***  No follow-ups on file.      {provider attestation***:1}   [name], PA-C  [facility] [phone] (phone) [phone] (fax)  [place]

## 2022-09-18 ENCOUNTER — Encounter: Payer: Self-pay | Admitting: Physician Assistant

## 2022-09-27 DIAGNOSIS — S52502A Unspecified fracture of the lower end of left radius, initial encounter for closed fracture: Secondary | ICD-10-CM | POA: Diagnosis not present

## 2022-09-27 DIAGNOSIS — S52612A Displaced fracture of left ulna styloid process, initial encounter for closed fracture: Secondary | ICD-10-CM | POA: Diagnosis not present

## 2022-09-27 DIAGNOSIS — S52572A Other intraarticular fracture of lower end of left radius, initial encounter for closed fracture: Secondary | ICD-10-CM | POA: Diagnosis not present

## 2022-09-27 DIAGNOSIS — S52602A Unspecified fracture of lower end of left ulna, initial encounter for closed fracture: Secondary | ICD-10-CM | POA: Diagnosis not present

## 2022-09-27 DIAGNOSIS — W1830XA Fall on same level, unspecified, initial encounter: Secondary | ICD-10-CM | POA: Diagnosis not present

## 2022-09-27 DIAGNOSIS — M79632 Pain in left forearm: Secondary | ICD-10-CM | POA: Diagnosis not present

## 2022-09-27 DIAGNOSIS — S52352A Displaced comminuted fracture of shaft of radius, left arm, initial encounter for closed fracture: Secondary | ICD-10-CM | POA: Diagnosis not present

## 2022-09-27 DIAGNOSIS — S52532A Colles' fracture of left radius, initial encounter for closed fracture: Secondary | ICD-10-CM | POA: Diagnosis not present

## 2022-10-02 ENCOUNTER — Other Ambulatory Visit: Payer: Self-pay | Admitting: Physician Assistant

## 2022-10-02 DIAGNOSIS — J449 Chronic obstructive pulmonary disease, unspecified: Secondary | ICD-10-CM

## 2022-10-07 DIAGNOSIS — S52572D Other intraarticular fracture of lower end of left radius, subsequent encounter for closed fracture with routine healing: Secondary | ICD-10-CM | POA: Diagnosis not present

## 2022-10-07 DIAGNOSIS — S52612D Displaced fracture of left ulna styloid process, subsequent encounter for closed fracture with routine healing: Secondary | ICD-10-CM | POA: Diagnosis not present

## 2022-10-14 DIAGNOSIS — S52612D Displaced fracture of left ulna styloid process, subsequent encounter for closed fracture with routine healing: Secondary | ICD-10-CM | POA: Diagnosis not present

## 2022-10-14 DIAGNOSIS — S52572D Other intraarticular fracture of lower end of left radius, subsequent encounter for closed fracture with routine healing: Secondary | ICD-10-CM | POA: Diagnosis not present

## 2022-10-27 ENCOUNTER — Other Ambulatory Visit: Payer: Self-pay | Admitting: Physician Assistant

## 2022-10-27 DIAGNOSIS — J449 Chronic obstructive pulmonary disease, unspecified: Secondary | ICD-10-CM

## 2022-11-05 ENCOUNTER — Other Ambulatory Visit: Payer: Self-pay | Admitting: Physician Assistant

## 2022-11-05 ENCOUNTER — Telehealth: Payer: Self-pay | Admitting: Physician Assistant

## 2022-11-05 DIAGNOSIS — I2510 Atherosclerotic heart disease of native coronary artery without angina pectoris: Secondary | ICD-10-CM

## 2022-11-05 MED ORDER — ATORVASTATIN CALCIUM 40 MG PO TABS: ORAL_TABLET | ORAL | 0 refills | Status: DC

## 2022-11-05 NOTE — Telephone Encounter (Signed)
[facility] sent #30 Patient must be seen in office for further refills LOV 02/05/22 NOV not scheduled LRF 01/09/22 #90 1R

## 2022-11-05 NOTE — Telephone Encounter (Signed)
[facility] pharmacy faxed refill request for the following medications:   atorvastatin (LIPITOR) 40 MG tablet    Please advise  

## 2022-11-07 DIAGNOSIS — Z79899 Other long term (current) drug therapy: Secondary | ICD-10-CM | POA: Diagnosis not present

## 2022-11-07 DIAGNOSIS — M0579 Rheumatoid arthritis with rheumatoid factor of multiple sites without organ or systems involvement: Secondary | ICD-10-CM | POA: Diagnosis not present

## 2022-11-07 DIAGNOSIS — M069 Rheumatoid arthritis, unspecified: Secondary | ICD-10-CM | POA: Diagnosis not present

## 2022-11-11 ENCOUNTER — Other Ambulatory Visit: Payer: Self-pay | Admitting: Physician Assistant

## 2022-11-11 DIAGNOSIS — S52612D Displaced fracture of left ulna styloid process, subsequent encounter for closed fracture with routine healing: Secondary | ICD-10-CM | POA: Diagnosis not present

## 2022-11-11 DIAGNOSIS — S52572D Other intraarticular fracture of lower end of left radius, subsequent encounter for closed fracture with routine healing: Secondary | ICD-10-CM | POA: Diagnosis not present

## 2022-11-11 DIAGNOSIS — S6292XD Unspecified fracture of left wrist and hand, subsequent encounter for fracture with routine healing: Secondary | ICD-10-CM | POA: Diagnosis not present

## 2022-11-11 DIAGNOSIS — I10 Essential (primary) hypertension: Secondary | ICD-10-CM

## 2022-11-13 DIAGNOSIS — H524 Presbyopia: Secondary | ICD-10-CM | POA: Diagnosis not present

## 2022-11-13 DIAGNOSIS — Z0100 Encounter for examination of eyes and vision without abnormal findings: Secondary | ICD-10-CM | POA: Diagnosis not present

## 2022-11-30 ENCOUNTER — Other Ambulatory Visit: Payer: Self-pay | Admitting: Physician Assistant

## 2022-11-30 DIAGNOSIS — J449 Chronic obstructive pulmonary disease, unspecified: Secondary | ICD-10-CM

## 2022-12-06 ENCOUNTER — Other Ambulatory Visit: Payer: Self-pay | Admitting: Physician Assistant

## 2022-12-06 DIAGNOSIS — I2510 Atherosclerotic heart disease of native coronary artery without angina pectoris: Secondary | ICD-10-CM

## 2022-12-06 DIAGNOSIS — I10 Essential (primary) hypertension: Secondary | ICD-10-CM

## 2023-01-01 ENCOUNTER — Telehealth: Payer: Self-pay

## 2023-01-01 DIAGNOSIS — I10 Essential (primary) hypertension: Secondary | ICD-10-CM

## 2023-01-01 DIAGNOSIS — J449 Chronic obstructive pulmonary disease, unspecified: Secondary | ICD-10-CM

## 2023-01-01 NOTE — Telephone Encounter (Signed)
PharmD reviewed patient chart to assess eligibility for [facility] services. Patient was determined to be a good candidate for the program given the complexity of the medication regimen and overall risk for hospitalization and/or high healthcare utilization.   Referral entered in order to outreach patient and offer appointment with PharmD. Referral cosigned to PCP.  

## 2023-01-17 ENCOUNTER — Other Ambulatory Visit: Payer: Self-pay | Admitting: Physician Assistant

## 2023-01-17 DIAGNOSIS — J449 Chronic obstructive pulmonary disease, unspecified: Secondary | ICD-10-CM

## 2023-01-17 NOTE — Telephone Encounter (Signed)
Requested Prescriptions  Pending Prescriptions Disp Refills   albuterol (VENTOLIN HFA) 108 (90 Base) MCG/ACT inhaler [Pharmacy Med Name: ALBUTEROL HFA INH (200 PUFFS) 8.5GM] 8.5 g 0    Sig: INHALE 1 TO 2 PUFFS INTO THE LUNGS EVERY 6 HOURS AS NEEDED FOR WHEEZING OR SHORTNESS OF BREATH     Pulmonology:  Beta Agonists 2 Failed - 01/17/2023  3:40 AM      Failed - Last BP in normal range    BP Readings from Last 1 Encounters:  06/12/22 (!) 145/74         Passed - Last Heart Rate in normal range    Pulse Readings from Last 1 Encounters:  06/12/22 78         Passed - Valid encounter within last 12 months    Recent Outpatient Visits           11 months ago Abnormal vaginal bleeding   [facility] Alfredia Ferguson, PA-C   1 year ago Chronic obstructive pulmonary disease, unspecified COPD type [facility])   [place] [facility] Alfred[facility] Ferguson, PA-C   1 year ago Post-menopausal bleeding   [place] [facility] Alfredia Ferguson, PA-C   2 years ago Hypertension, unspecified type   [facility] [place], Alessandra Bevels, [place]   3 years ago Encounter for surveillance of injectable contraceptive   [place] [facility] [place], Marzella Schlein, [place]

## 2023-01-31 ENCOUNTER — Other Ambulatory Visit: Payer: Self-pay | Admitting: Physician Assistant

## 2023-01-31 DIAGNOSIS — J449 Chronic obstructive pulmonary disease, unspecified: Secondary | ICD-10-CM

## 2023-02-03 ENCOUNTER — Telehealth: Payer: Self-pay

## 2023-02-03 NOTE — Telephone Encounter (Signed)
Called pt. In regarding verifying mailing address, no answer.

## 2023-02-09 DIAGNOSIS — Z743 Need for continuous supervision: Secondary | ICD-10-CM | POA: Diagnosis not present

## 2023-02-09 DIAGNOSIS — F419 Anxiety disorder, unspecified: Secondary | ICD-10-CM | POA: Diagnosis not present

## 2023-02-09 DIAGNOSIS — F988 Other specified behavioral and emotional disorders with onset usually occurring in childhood and adolescence: Secondary | ICD-10-CM | POA: Diagnosis not present

## 2023-02-09 DIAGNOSIS — I6523 Occlusion and stenosis of bilateral carotid arteries: Secondary | ICD-10-CM | POA: Diagnosis not present

## 2023-02-09 DIAGNOSIS — I10 Essential (primary) hypertension: Secondary | ICD-10-CM | POA: Diagnosis not present

## 2023-02-09 DIAGNOSIS — Z888 Allergy status to other drugs, medicaments and biological substances status: Secondary | ICD-10-CM | POA: Diagnosis not present

## 2023-02-09 DIAGNOSIS — R404 Transient alteration of awareness: Secondary | ICD-10-CM | POA: Diagnosis not present

## 2023-02-09 DIAGNOSIS — R4182 Altered mental status, unspecified: Secondary | ICD-10-CM | POA: Diagnosis not present

## 2023-02-09 DIAGNOSIS — I639 Cerebral infarction, unspecified: Secondary | ICD-10-CM | POA: Diagnosis not present

## 2023-02-09 DIAGNOSIS — E785 Hyperlipidemia, unspecified: Secondary | ICD-10-CM | POA: Diagnosis not present

## 2023-02-10 DIAGNOSIS — R29707 NIHSS score 7: Secondary | ICD-10-CM | POA: Diagnosis not present

## 2023-02-10 DIAGNOSIS — I63512 Cerebral infarction due to unspecified occlusion or stenosis of left middle cerebral artery: Secondary | ICD-10-CM | POA: Diagnosis not present

## 2023-02-10 DIAGNOSIS — R29810 Facial weakness: Secondary | ICD-10-CM | POA: Diagnosis not present

## 2023-02-10 DIAGNOSIS — Z87891 Personal history of nicotine dependence: Secondary | ICD-10-CM | POA: Diagnosis not present

## 2023-02-10 DIAGNOSIS — I5020 Unspecified systolic (congestive) heart failure: Secondary | ICD-10-CM | POA: Diagnosis not present

## 2023-02-10 DIAGNOSIS — J432 Centrilobular emphysema: Secondary | ICD-10-CM | POA: Diagnosis not present

## 2023-02-10 DIAGNOSIS — R4701 Aphasia: Secondary | ICD-10-CM | POA: Diagnosis not present

## 2023-02-10 DIAGNOSIS — J449 Chronic obstructive pulmonary disease, unspecified: Secondary | ICD-10-CM | POA: Diagnosis not present

## 2023-02-10 DIAGNOSIS — F419 Anxiety disorder, unspecified: Secondary | ICD-10-CM | POA: Diagnosis not present

## 2023-02-10 DIAGNOSIS — R918 Other nonspecific abnormal finding of lung field: Secondary | ICD-10-CM | POA: Diagnosis not present

## 2023-02-10 DIAGNOSIS — Z79899 Other long term (current) drug therapy: Secondary | ICD-10-CM | POA: Diagnosis not present

## 2023-02-10 DIAGNOSIS — E785 Hyperlipidemia, unspecified: Secondary | ICD-10-CM | POA: Diagnosis not present

## 2023-02-10 DIAGNOSIS — Z7982 Long term (current) use of aspirin: Secondary | ICD-10-CM | POA: Diagnosis not present

## 2023-02-10 DIAGNOSIS — I110 Hypertensive heart disease with heart failure: Secondary | ICD-10-CM | POA: Diagnosis not present

## 2023-02-10 DIAGNOSIS — F319 Bipolar disorder, unspecified: Secondary | ICD-10-CM | POA: Diagnosis not present

## 2023-02-10 DIAGNOSIS — I6389 Other cerebral infarction: Secondary | ICD-10-CM | POA: Diagnosis not present

## 2023-02-10 DIAGNOSIS — I2510 Atherosclerotic heart disease of native coronary artery without angina pectoris: Secondary | ICD-10-CM | POA: Diagnosis not present

## 2023-02-10 DIAGNOSIS — I6320 Cerebral infarction due to unspecified occlusion or stenosis of unspecified precerebral arteries: Secondary | ICD-10-CM | POA: Diagnosis not present

## 2023-02-10 DIAGNOSIS — I10 Essential (primary) hypertension: Secondary | ICD-10-CM | POA: Diagnosis not present

## 2023-02-12 DIAGNOSIS — I6320 Cerebral infarction due to unspecified occlusion or stenosis of unspecified precerebral arteries: Secondary | ICD-10-CM | POA: Diagnosis not present

## 2023-02-12 DIAGNOSIS — I051 Rheumatic mitral insufficiency: Secondary | ICD-10-CM | POA: Diagnosis not present

## 2023-02-12 DIAGNOSIS — I63512 Cerebral infarction due to unspecified occlusion or stenosis of left middle cerebral artery: Secondary | ICD-10-CM | POA: Diagnosis not present

## 2023-02-12 DIAGNOSIS — I061 Rheumatic aortic insufficiency: Secondary | ICD-10-CM | POA: Diagnosis not present

## 2023-02-12 DIAGNOSIS — R4701 Aphasia: Secondary | ICD-10-CM | POA: Diagnosis not present

## 2023-02-12 DIAGNOSIS — E876 Hypokalemia: Secondary | ICD-10-CM | POA: Diagnosis not present

## 2023-02-12 DIAGNOSIS — I5020 Unspecified systolic (congestive) heart failure: Secondary | ICD-10-CM | POA: Insufficient documentation

## 2023-02-18 DIAGNOSIS — L4050 Arthropathic psoriasis, unspecified: Secondary | ICD-10-CM | POA: Diagnosis not present

## 2023-02-18 DIAGNOSIS — L400 Psoriasis vulgaris: Secondary | ICD-10-CM | POA: Insufficient documentation

## 2023-02-18 DIAGNOSIS — M0579 Rheumatoid arthritis with rheumatoid factor of multiple sites without organ or systems involvement: Secondary | ICD-10-CM | POA: Diagnosis not present

## 2023-02-18 DIAGNOSIS — Z79899 Other long term (current) drug therapy: Secondary | ICD-10-CM | POA: Diagnosis not present

## 2023-02-18 DIAGNOSIS — Z7952 Long term (current) use of systemic steroids: Secondary | ICD-10-CM | POA: Diagnosis not present

## 2023-02-21 DIAGNOSIS — I5023 Acute on chronic systolic (congestive) heart failure: Secondary | ICD-10-CM | POA: Diagnosis not present

## 2023-02-21 DIAGNOSIS — I10 Essential (primary) hypertension: Secondary | ICD-10-CM | POA: Diagnosis not present

## 2023-02-21 DIAGNOSIS — I110 Hypertensive heart disease with heart failure: Secondary | ICD-10-CM | POA: Diagnosis not present

## 2023-02-21 DIAGNOSIS — I2510 Atherosclerotic heart disease of native coronary artery without angina pectoris: Secondary | ICD-10-CM | POA: Diagnosis not present

## 2023-02-21 DIAGNOSIS — Z8673 Personal history of transient ischemic attack (TIA), and cerebral infarction without residual deficits: Secondary | ICD-10-CM | POA: Diagnosis not present

## 2023-02-21 DIAGNOSIS — Z1331 Encounter for screening for depression: Secondary | ICD-10-CM | POA: Diagnosis not present

## 2023-02-25 ENCOUNTER — Inpatient Hospital Stay: Payer: Medicare Other | Admitting: Physician Assistant

## 2023-03-07 ENCOUNTER — Other Ambulatory Visit: Payer: Self-pay | Admitting: Physician Assistant

## 2023-03-07 DIAGNOSIS — I10 Essential (primary) hypertension: Secondary | ICD-10-CM

## 2023-03-10 ENCOUNTER — Telehealth: Payer: Self-pay | Admitting: Family Medicine

## 2023-03-10 ENCOUNTER — Other Ambulatory Visit: Payer: Self-pay

## 2023-03-10 DIAGNOSIS — I10 Essential (primary) hypertension: Secondary | ICD-10-CM

## 2023-03-10 NOTE — Telephone Encounter (Signed)
Requested medications are due for refill today.  yes  Requested medications are on the active medications list.  yes  Last refill. 12/06/2022 #90 0 rf  Future visit scheduled.   no  Notes to clinic.  Pt is more than 3 months overdue for an ov. Alfredia Ferguson listed as PCP.    Requested Prescriptions  Pending Prescriptions Disp Refills   hydrochlorothiazide (MICROZIDE) 12.5 MG capsule [Pharmacy Med Name: HYDROCHLOROTHIAZIDE 12.5MG  CAPSULES] 90 capsule 0    Sig: TAKE 1 CAPSULE(12.5 MG) BY MOUTH DAILY     Cardiovascular: Diuretics - Thiazide Failed - 03/07/2023 11:41 PM      Failed - Cr in normal range and within 180 days    Creat  Date Value Ref Range Status  06/12/2017 0.94 0.50 - 1.10 mg/dL Final   Creatinine, Ser  Date Value Ref Range Status  06/12/2022 0.81 0.57 - 1.00 mg/dL Final         Failed - K in normal range and within 180 days    Potassium  Date Value Ref Range Status  06/12/2022 4.2 3.5 - 5.2 mmol/L Final  06/22/2014 3.6 3.5 - 5.1 mmol/L Final         Failed - Na in normal range and within 180 days    Sodium  Date Value Ref Range Status  06/12/2022 140 134 - 144 mmol/L Final  06/22/2014 143 136 - 145 mmol/L Final         Failed - Last BP in normal range    BP Readings from Last 1 Encounters:  06/12/22 (!) 145/74         Failed - Valid encounter within last 6 months    Recent Outpatient Visits           1 year ago Abnormal vaginal bleeding   [place] [facility] Alfredia Ferguson, PA-C   1 year ago Chronic obstructive pulmonary disease, unspecified COPD type [facility])   [place] [facility] Alfredia Ferguson, PA-C   1 year ago Post-menopausal bleeding   [place] [facility] Alfredia Ferguson, PA-C   2 years ago Hypertension, unspecified type   [facility] [place], Alessandra Bevels, [place]   3 years ago Encounter for surveillance of injectable contraceptive   [place] [facility] [place], Marzella Schlein, MD

## 2023-03-10 NOTE — Telephone Encounter (Signed)
[facility] pharmacy requesting prescription refill hydrochlorothiazide (MICROZIDE) 12.5 MG capsule  Please advise

## 2023-03-11 ENCOUNTER — Other Ambulatory Visit: Payer: Self-pay | Admitting: Obstetrics and Gynecology

## 2023-03-11 DIAGNOSIS — E782 Mixed hyperlipidemia: Secondary | ICD-10-CM | POA: Diagnosis not present

## 2023-03-11 DIAGNOSIS — I2510 Atherosclerotic heart disease of native coronary artery without angina pectoris: Secondary | ICD-10-CM | POA: Diagnosis not present

## 2023-03-11 DIAGNOSIS — F909 Attention-deficit hyperactivity disorder, unspecified type: Secondary | ICD-10-CM | POA: Diagnosis not present

## 2023-03-11 DIAGNOSIS — N939 Abnormal uterine and vaginal bleeding, unspecified: Secondary | ICD-10-CM

## 2023-03-11 DIAGNOSIS — I639 Cerebral infarction, unspecified: Secondary | ICD-10-CM | POA: Diagnosis not present

## 2023-03-11 DIAGNOSIS — I10 Essential (primary) hypertension: Secondary | ICD-10-CM | POA: Diagnosis not present

## 2023-03-11 DIAGNOSIS — I429 Cardiomyopathy, unspecified: Secondary | ICD-10-CM | POA: Diagnosis not present

## 2023-03-11 NOTE — Telephone Encounter (Signed)
I contacted the patient via phone. I left message for the patient to contact our office for scheduling. DJE August appointments haven't been released yet. I was going to offer first opening with another provider.

## 2023-03-12 NOTE — Telephone Encounter (Signed)
I contacted the patient for appointment, I left generic message for the patient to call back for scheduling. The first opening is 8/14 at 9:15 am with Dr.Evans for annual exam.

## 2023-03-13 NOTE — Telephone Encounter (Signed)
I contact the patient for appointment, I left generic message for the patient to call back to for scheduling. The patient is aware of this call is in regards to an prescription refill request.

## 2023-03-19 NOTE — Telephone Encounter (Signed)
Reached out to patient to get scheduled for her annual but had to leave vmail asked to call back to office.

## 2023-03-28 DIAGNOSIS — I5020 Unspecified systolic (congestive) heart failure: Secondary | ICD-10-CM | POA: Diagnosis not present

## 2023-03-28 DIAGNOSIS — I639 Cerebral infarction, unspecified: Secondary | ICD-10-CM | POA: Diagnosis not present

## 2023-04-10 ENCOUNTER — Telehealth: Payer: Self-pay

## 2023-04-10 NOTE — Telephone Encounter (Signed)
 Reached out to patient to set up follow up visit with provider to discuss chronic conditions.  Telephone encounter attempt : 1   A HIPAA compliant voice message was left requesting a return call.  Instructed patient to call office or to call me at [phone].  [facility] Specialist

## 2023-04-29 DIAGNOSIS — I639 Cerebral infarction, unspecified: Secondary | ICD-10-CM | POA: Diagnosis not present

## 2023-04-29 DIAGNOSIS — I5020 Unspecified systolic (congestive) heart failure: Secondary | ICD-10-CM | POA: Diagnosis not present

## 2023-05-13 DIAGNOSIS — L400 Psoriasis vulgaris: Secondary | ICD-10-CM | POA: Diagnosis not present

## 2023-05-13 DIAGNOSIS — M0579 Rheumatoid arthritis with rheumatoid factor of multiple sites without organ or systems involvement: Secondary | ICD-10-CM | POA: Diagnosis not present

## 2023-05-13 DIAGNOSIS — Z7960 Long term (current) use of unspecified immunomodulators and immunosuppressants: Secondary | ICD-10-CM | POA: Diagnosis not present

## 2023-05-13 DIAGNOSIS — L4050 Arthropathic psoriasis, unspecified: Secondary | ICD-10-CM | POA: Diagnosis not present

## 2023-05-17 ENCOUNTER — Other Ambulatory Visit: Payer: Self-pay | Admitting: Physician Assistant

## 2023-05-17 DIAGNOSIS — I10 Essential (primary) hypertension: Secondary | ICD-10-CM

## 2023-05-19 ENCOUNTER — Other Ambulatory Visit: Payer: Self-pay | Admitting: Family Medicine

## 2023-05-19 DIAGNOSIS — I10 Essential (primary) hypertension: Secondary | ICD-10-CM

## 2023-05-19 NOTE — Telephone Encounter (Signed)
Requested medication (s) are due for refill today:   Yes  Requested medication (s) are on the active medication list:   Yes  Future visit scheduled:   No   Left a voicemail to call in for an appt.   Last ordered: 11/11/2022 #90, 1 refill  Unable to refill due to labs being due and an OV due.   Provider to review for refills   Requested Prescriptions  Pending Prescriptions Disp Refills   losartan (COZAAR) 100 MG tablet [Pharmacy Med Name: LOSARTAN 100MG  TABLETS] 90 tablet 1    Sig: TAKE 1 TABLET(100 MG) BY MOUTH DAILY     Cardiovascular:  Angiotensin Receptor Blockers Failed - 05/17/2023  3:27 AM      Failed - Cr in normal range and within 180 days    Creat  Date Value Ref Range Status  06/12/2017 0.94 0.50 - 1.10 mg/dL Final   Creatinine, Ser  Date Value Ref Range Status  06/12/2022 0.81 0.57 - 1.00 mg/dL Final         Failed - K in normal range and within 180 days    Potassium  Date Value Ref Range Status  06/12/2022 4.2 3.5 - 5.2 mmol/L Final  06/22/2014 3.6 3.5 - 5.1 mmol/L Final         Failed - Last BP in normal range    BP Readings from Last 1 Encounters:  06/12/22 (!) 145/74         Failed - Valid encounter within last 6 months    Recent Outpatient Visits           1 year ago Abnormal vaginal bleeding   [place] [facility] Alfredia Ferguson, PA-C   1 year ago Chronic obstructive pulmonary disease, unspecified COPD type [facility])   [place] [facility] Alfredia Ferguson, PA-C   1 year ago Post-menopausal bleeding   [place] [facility] Alfredia Ferguson, PA-C   2 years ago Hypertension, unspecified type   [facility] [place], Alessandra Bevels, [place]   3 years ago Encounter for surveillance of injectable contraceptive   [place] [facility] [place], Marzella Schlein, MD              Passed - Patient is not pregnant

## 2023-05-19 NOTE — Telephone Encounter (Signed)
[facility] pharmacy is requesting prescription refill losartan (COZAAR) 100 MG tablet   Please advise

## 2023-05-20 ENCOUNTER — Other Ambulatory Visit: Payer: Self-pay | Admitting: Family Medicine

## 2023-05-20 DIAGNOSIS — I10 Essential (primary) hypertension: Secondary | ICD-10-CM

## 2023-05-23 DIAGNOSIS — I63521 Cerebral infarction due to unspecified occlusion or stenosis of right anterior cerebral artery: Secondary | ICD-10-CM | POA: Diagnosis not present

## 2023-05-23 DIAGNOSIS — R41841 Cognitive communication deficit: Secondary | ICD-10-CM | POA: Diagnosis not present

## 2023-05-23 DIAGNOSIS — I639 Cerebral infarction, unspecified: Secondary | ICD-10-CM | POA: Diagnosis not present

## 2023-05-23 DIAGNOSIS — R600 Localized edema: Secondary | ICD-10-CM | POA: Diagnosis not present

## 2023-05-23 DIAGNOSIS — B957 Other staphylococcus as the cause of diseases classified elsewhere: Secondary | ICD-10-CM | POA: Diagnosis not present

## 2023-05-23 DIAGNOSIS — I63513 Cerebral infarction due to unspecified occlusion or stenosis of bilateral middle cerebral arteries: Secondary | ICD-10-CM | POA: Diagnosis not present

## 2023-05-23 DIAGNOSIS — B958 Unspecified staphylococcus as the cause of diseases classified elsewhere: Secondary | ICD-10-CM | POA: Diagnosis not present

## 2023-05-23 DIAGNOSIS — I5022 Chronic systolic (congestive) heart failure: Secondary | ICD-10-CM | POA: Diagnosis not present

## 2023-05-23 DIAGNOSIS — Z4659 Encounter for fitting and adjustment of other gastrointestinal appliance and device: Secondary | ICD-10-CM | POA: Diagnosis not present

## 2023-05-23 DIAGNOSIS — R9431 Abnormal electrocardiogram [ECG] [EKG]: Secondary | ICD-10-CM | POA: Diagnosis not present

## 2023-05-23 DIAGNOSIS — I63512 Cerebral infarction due to unspecified occlusion or stenosis of left middle cerebral artery: Secondary | ICD-10-CM | POA: Diagnosis not present

## 2023-05-23 DIAGNOSIS — M069 Rheumatoid arthritis, unspecified: Secondary | ICD-10-CM | POA: Diagnosis not present

## 2023-05-23 DIAGNOSIS — L409 Psoriasis, unspecified: Secondary | ICD-10-CM | POA: Diagnosis not present

## 2023-05-23 DIAGNOSIS — Z8739 Personal history of other diseases of the musculoskeletal system and connective tissue: Secondary | ICD-10-CM | POA: Diagnosis not present

## 2023-05-23 DIAGNOSIS — I69391 Dysphagia following cerebral infarction: Secondary | ICD-10-CM | POA: Diagnosis not present

## 2023-05-23 DIAGNOSIS — I5020 Unspecified systolic (congestive) heart failure: Secondary | ICD-10-CM | POA: Diagnosis not present

## 2023-05-23 DIAGNOSIS — E876 Hypokalemia: Secondary | ICD-10-CM | POA: Diagnosis not present

## 2023-05-23 DIAGNOSIS — Z4682 Encounter for fitting and adjustment of non-vascular catheter: Secondary | ICD-10-CM | POA: Diagnosis not present

## 2023-05-23 DIAGNOSIS — R6889 Other general symptoms and signs: Secondary | ICD-10-CM | POA: Diagnosis not present

## 2023-05-23 DIAGNOSIS — Z95828 Presence of other vascular implants and grafts: Secondary | ICD-10-CM | POA: Diagnosis not present

## 2023-05-23 DIAGNOSIS — R935 Abnormal findings on diagnostic imaging of other abdominal regions, including retroperitoneum: Secondary | ICD-10-CM | POA: Diagnosis not present

## 2023-05-23 DIAGNOSIS — R451 Restlessness and agitation: Secondary | ICD-10-CM | POA: Diagnosis not present

## 2023-05-23 DIAGNOSIS — L03116 Cellulitis of left lower limb: Secondary | ICD-10-CM | POA: Diagnosis not present

## 2023-05-23 DIAGNOSIS — I340 Nonrheumatic mitral (valve) insufficiency: Secondary | ICD-10-CM | POA: Diagnosis not present

## 2023-05-23 DIAGNOSIS — I2510 Atherosclerotic heart disease of native coronary artery without angina pectoris: Secondary | ICD-10-CM | POA: Diagnosis not present

## 2023-05-23 DIAGNOSIS — I110 Hypertensive heart disease with heart failure: Secondary | ICD-10-CM | POA: Diagnosis not present

## 2023-05-23 DIAGNOSIS — I63522 Cerebral infarction due to unspecified occlusion or stenosis of left anterior cerebral artery: Secondary | ICD-10-CM | POA: Diagnosis not present

## 2023-05-23 DIAGNOSIS — F419 Anxiety disorder, unspecified: Secondary | ICD-10-CM | POA: Diagnosis not present

## 2023-05-23 DIAGNOSIS — F603 Borderline personality disorder: Secondary | ICD-10-CM | POA: Diagnosis not present

## 2023-05-23 DIAGNOSIS — J9809 Other diseases of bronchus, not elsewhere classified: Secondary | ICD-10-CM | POA: Diagnosis not present

## 2023-05-23 DIAGNOSIS — R29810 Facial weakness: Secondary | ICD-10-CM | POA: Diagnosis not present

## 2023-05-23 DIAGNOSIS — F319 Bipolar disorder, unspecified: Secondary | ICD-10-CM | POA: Diagnosis not present

## 2023-05-23 DIAGNOSIS — I63511 Cerebral infarction due to unspecified occlusion or stenosis of right middle cerebral artery: Secondary | ICD-10-CM | POA: Diagnosis not present

## 2023-05-23 DIAGNOSIS — F29 Unspecified psychosis not due to a substance or known physiological condition: Secondary | ICD-10-CM | POA: Diagnosis not present

## 2023-05-23 DIAGNOSIS — R0682 Tachypnea, not elsewhere classified: Secondary | ICD-10-CM | POA: Diagnosis not present

## 2023-05-23 DIAGNOSIS — I6389 Other cerebral infarction: Secondary | ICD-10-CM | POA: Diagnosis not present

## 2023-05-23 DIAGNOSIS — R569 Unspecified convulsions: Secondary | ICD-10-CM | POA: Diagnosis not present

## 2023-05-23 DIAGNOSIS — D849 Immunodeficiency, unspecified: Secondary | ICD-10-CM | POA: Diagnosis not present

## 2023-05-23 DIAGNOSIS — Z0189 Encounter for other specified special examinations: Secondary | ICD-10-CM | POA: Diagnosis not present

## 2023-05-23 DIAGNOSIS — I517 Cardiomegaly: Secondary | ICD-10-CM | POA: Diagnosis not present

## 2023-05-23 DIAGNOSIS — R1312 Dysphagia, oropharyngeal phase: Secondary | ICD-10-CM | POA: Diagnosis not present

## 2023-05-23 DIAGNOSIS — J449 Chronic obstructive pulmonary disease, unspecified: Secondary | ICD-10-CM | POA: Diagnosis not present

## 2023-05-23 DIAGNOSIS — L4050 Arthropathic psoriasis, unspecified: Secondary | ICD-10-CM | POA: Diagnosis not present

## 2023-05-23 DIAGNOSIS — R1310 Dysphagia, unspecified: Secondary | ICD-10-CM | POA: Diagnosis not present

## 2023-05-23 DIAGNOSIS — F05 Delirium due to known physiological condition: Secondary | ICD-10-CM | POA: Diagnosis not present

## 2023-05-23 DIAGNOSIS — Z79899 Other long term (current) drug therapy: Secondary | ICD-10-CM | POA: Diagnosis not present

## 2023-05-23 DIAGNOSIS — M436 Torticollis: Secondary | ICD-10-CM | POA: Diagnosis not present

## 2023-05-23 DIAGNOSIS — R4182 Altered mental status, unspecified: Secondary | ICD-10-CM | POA: Diagnosis not present

## 2023-05-23 DIAGNOSIS — R4701 Aphasia: Secondary | ICD-10-CM | POA: Diagnosis not present

## 2023-05-23 DIAGNOSIS — G9341 Metabolic encephalopathy: Secondary | ICD-10-CM | POA: Diagnosis not present

## 2023-05-23 DIAGNOSIS — Z781 Physical restraint status: Secondary | ICD-10-CM | POA: Diagnosis not present

## 2023-05-23 DIAGNOSIS — I69311 Memory deficit following cerebral infarction: Secondary | ICD-10-CM | POA: Diagnosis not present

## 2023-05-23 DIAGNOSIS — G894 Chronic pain syndrome: Secondary | ICD-10-CM | POA: Diagnosis not present

## 2023-05-23 DIAGNOSIS — Z7952 Long term (current) use of systemic steroids: Secondary | ICD-10-CM | POA: Diagnosis not present

## 2023-05-23 DIAGNOSIS — E785 Hyperlipidemia, unspecified: Secondary | ICD-10-CM | POA: Diagnosis not present

## 2023-05-23 DIAGNOSIS — R7881 Bacteremia: Secondary | ICD-10-CM | POA: Diagnosis not present

## 2023-05-28 DIAGNOSIS — R7881 Bacteremia: Secondary | ICD-10-CM | POA: Insufficient documentation

## 2023-05-28 HISTORY — DX: Bacteremia: R78.81

## 2023-06-06 DIAGNOSIS — Z1331 Encounter for screening for depression: Secondary | ICD-10-CM | POA: Diagnosis not present

## 2023-06-06 DIAGNOSIS — I10 Essential (primary) hypertension: Secondary | ICD-10-CM | POA: Diagnosis not present

## 2023-06-06 DIAGNOSIS — I110 Hypertensive heart disease with heart failure: Secondary | ICD-10-CM | POA: Diagnosis not present

## 2023-06-06 DIAGNOSIS — Z8673 Personal history of transient ischemic attack (TIA), and cerebral infarction without residual deficits: Secondary | ICD-10-CM | POA: Diagnosis not present

## 2023-06-06 DIAGNOSIS — I5023 Acute on chronic systolic (congestive) heart failure: Secondary | ICD-10-CM | POA: Diagnosis not present

## 2023-06-06 DIAGNOSIS — I2510 Atherosclerotic heart disease of native coronary artery without angina pectoris: Secondary | ICD-10-CM | POA: Diagnosis not present

## 2023-06-06 DIAGNOSIS — R569 Unspecified convulsions: Secondary | ICD-10-CM | POA: Diagnosis not present

## 2023-06-06 DIAGNOSIS — Z87898 Personal history of other specified conditions: Secondary | ICD-10-CM | POA: Diagnosis not present

## 2023-06-06 NOTE — Telephone Encounter (Signed)
Attempted to reach patient,Unable to LVM, no active mychart.

## 2023-06-09 NOTE — Telephone Encounter (Signed)
Contacted patient for scheduling, unable to LVM

## 2023-06-11 ENCOUNTER — Ambulatory Visit (INDEPENDENT_AMBULATORY_CARE_PROVIDER_SITE_OTHER): Payer: Medicare HMO | Admitting: Family Medicine

## 2023-06-11 ENCOUNTER — Encounter: Payer: Self-pay | Admitting: Family Medicine

## 2023-06-11 VITALS — BP 120/71 | HR 73 | Temp 98.3°F | Ht 61.0 in | Wt 159.5 lb

## 2023-06-11 DIAGNOSIS — I6930 Unspecified sequelae of cerebral infarction: Secondary | ICD-10-CM | POA: Diagnosis not present

## 2023-06-11 DIAGNOSIS — J449 Chronic obstructive pulmonary disease, unspecified: Secondary | ICD-10-CM

## 2023-06-11 DIAGNOSIS — M0579 Rheumatoid arthritis with rheumatoid factor of multiple sites without organ or systems involvement: Secondary | ICD-10-CM | POA: Diagnosis not present

## 2023-06-11 DIAGNOSIS — E782 Mixed hyperlipidemia: Secondary | ICD-10-CM | POA: Diagnosis not present

## 2023-06-11 DIAGNOSIS — F17200 Nicotine dependence, unspecified, uncomplicated: Secondary | ICD-10-CM | POA: Diagnosis not present

## 2023-06-11 DIAGNOSIS — I10 Essential (primary) hypertension: Secondary | ICD-10-CM

## 2023-06-11 DIAGNOSIS — N939 Abnormal uterine and vaginal bleeding, unspecified: Secondary | ICD-10-CM

## 2023-06-11 DIAGNOSIS — Z8673 Personal history of transient ischemic attack (TIA), and cerebral infarction without residual deficits: Secondary | ICD-10-CM | POA: Insufficient documentation

## 2023-06-11 DIAGNOSIS — Z09 Encounter for follow-up examination after completed treatment for conditions other than malignant neoplasm: Secondary | ICD-10-CM | POA: Diagnosis not present

## 2023-06-11 DIAGNOSIS — I429 Cardiomyopathy, unspecified: Secondary | ICD-10-CM | POA: Diagnosis not present

## 2023-06-11 DIAGNOSIS — G40909 Epilepsy, unspecified, not intractable, without status epilepticus: Secondary | ICD-10-CM

## 2023-06-11 NOTE — Assessment & Plan Note (Addendum)
No acute concerns.  Continue to monitor. - Continue prednisone 5mg  daily  - Continue leflunomide 20mg  daily  - Continuing sulfasalazine 500mg  BID

## 2023-06-11 NOTE — Assessment & Plan Note (Signed)
Following with neurology, Rolla Plate, NP.  Will defer to their management. Per neurology note 06/06/2023, will continue lacosamide 100 mg twice daily through January 2025, or longer as determined by their follow-up visit at that time.

## 2023-06-11 NOTE — Progress Notes (Unsigned)
Established patient visit   Patient: Susan Gilmore   DOB: 1966-07-28   57 y.o. Female  MRN: [id number] Visit Date: 06/11/2023  Today's healthcare provider: Sherlyn Hay, DO   Chief Complaint  Patient presents with  . Follow-up    Hospital Follow Up for stroke (2)   Subjective    HPI DC'd from [facility] s/p CVA (Acute right MCA).  05/23/2023-06/03/2023  - MRI brain with acute infarcts in R MCA and L ACA territory concerning for central embolic etiology, with petichial hemorrhage in L frontal lobe infarct. Also, treated for CoNS (MRSE) bacteremia; no valvular abnormalities on echo (high suspicion for cardio-embolism in setting of bacteremia although no vegetation or thrombus seen on TTE/TEE )  For psychoactive medications, patient [facility] [place]  -   Has home health started? Started speech therapy?  Saw cardiology earlier today at [facility] MRI stress test scheduled for 11/12024 Rheum - Mayur Khandu scheduled for 08/18/2023 Neuro Rolla Plate 09/05/2023  Sleeping all the time Eating, but not a normal quantity per her daughter; snacks some  Trouble with hearing, speaking and balance. Otherwise no deficits  LMP: while in the hospital  - normally heavy/painful  - on combined-OCP for this  - also vapes nicotine ***     Additional anticipatory guidance  - For PCP: recommend close blood pressure control, support with smoking cessation, and discussion about use of Adderall and Xanax - For Neurology: anticipate Vimpat 100 mg BID for 3 months (08/2023) and if no clinical concern for seizures can wean off   - Continue duloxetine 120mg , nortryptiline 10mg  at discharge - Discontinued xanax at discharge - Defer adderall continuation to outpatient provider   "CT Chest w/o contrast 02/11/23: 1. A few small solid bilateral pulmonary nodules measuring up to 4mm in size. Consider 12 month follow-up CT chest in high risk patient. 2. Mild  emphysema with background of diffuse groundglass opacities. Findings can be seen in the setting of smoking related lung diseases. 3. Severe coronary artery calcifications. "  Neuro note 06/06/23: "she is no longer driving and needs assistance with iADLs. She is ambulatory without assistive device but continues to have some word finding difficulty and paraphasic errors. She is complaining of decreased hearing bilaterally since the most recent stroke. She is yet to establish care for speech therapy and has a prescription for outpatient speech therapy with her."  "The witnessed seizure episode she had is likely related to the structural abnormality related to her stroke and she is optimized on monotherapy with Lacosamide."  "#Agitation Patient has had intermittent episodes of agitation, repeatedly stating know that she wants to get "out of here", thrashing erratically in her bed and refusing cares. She was treated with Versed, Haldol, Ativan in the ED. Second episode occurred on arrival to the floor and she was very responsive to Ativan. Smokes marijuana daily and uses vape pen, but no other illicit drug use. Has a prescription for Xanax 3 times daily which has been filled regularly, but family denies patient use. pEEG with diffuse slowing, but no electrographic seizure. Initially required additional PRNs and 4 point restraints/mitts for agitation. Psych was consulted on 9/29 who recommended initiation of CIWA protocol called out of concern for benzo withdrawal. They also recommended holding patient's Adderall, in addition to nortriptyline and duloxetine out of concern for possible serotonin syndrome. Primary team also considered alternative etiologies for altered mental status including autoimmune involvement, given history of rheumatoid arthritis. In  a 24-hour period on the CIWA protocol, patient received 18 mg of Ativan. She subsequently was quite somnolent, but after backing off the frequency of scheduled and  prn Ativan, her mentation has gradually improved. She was also found to be bacteremic, and treatment of underlying infection has further improved her mentation. At the time of discharge, her mentation was consistent with baseline. "  Aphasia due to acute cerebrovascular accident (CVA) (CMS/HHS-HCC) (Primary Dx);  Altered mental status, unspecified altered mental status type;  Oropharyngeal dysphagia;  Cognitive communication deficit;  Hypokalemia;  Tachypnea;  Acute ischemic left MCA stroke (CMS/HHS-HCC);  Encounter for imaging study to confirm nasogastric (NG) tube placement;  Seizure-like activity (CMS/HHS-HCC);  Bacteremia due to Gram-positive bacteria;  Dysphagia due to recent stroke;  Acute right MCA stroke (CMS/HHS-HCC);  Psoriatic arthritis (CMS/HHS-HCC);  Plaque psoriasis;  HFrEF (heart failure with reduced ejection fraction) (CMS/HHS-HCC);  Hyperlipidemia with target LDL less than 70;  Hypertension, essential;  Expressive aphasia;  Encounter for long-term (current) use of high-risk medication;  Current chronic use of systemic steroids;  Atrial bigeminy    {History (Optional):23778}  Medications: Outpatient Medications Prior to Visit  Medication Sig Note  . albuterol (VENTOLIN HFA) 108 (90 Base) MCG/ACT inhaler INHALE 1 TO 2 PUFFS INTO THE LUNGS EVERY 6 HOURS AS NEEDED FOR WHEEZING OR SHORTNESS OF BREATH   . amLODipine (NORVASC) 10 MG tablet Take 1 tablet (10 mg total) by mouth daily.   [name] aspirin EC 81 MG tablet Take 1 tablet (81 mg total) by mouth daily.   [name] atorvastatin (LIPITOR) 40 MG tablet TAKE 1 TABLET BY MOUTH DAILY   . baclofen (LIORESAL) 10 MG tablet Take 1 tablet (10 mg total) by mouth daily as needed for muscle spasms.   [name] BREZTRI AEROSPHERE 160-9-4.8 MCG/ACT AERO INHALE 2 PUFFS INTO THE LUNGS TWICE DAILY   . DULoxetine (CYMBALTA) 60 MG capsule Take 120 mg by mouth daily.    . fluocinonide cream (LIDEX) 0.05 %    . fluticasone (FLONASE) 50 MCG/ACT nasal spray  Place 2 sprays into both nostrils daily. (Patient taking differently: Place 2 sprays into both nostrils 2 (two) times daily.)   . FLUZONE QUADRIVALENT 0.5 ML injection    . HUMIRA PEN 40 MG/0.4ML PNKT    . hydrochlorothiazide (MICROZIDE) 12.5 MG capsule TAKE 1 CAPSULE(12.5 MG) BY MOUTH DAILY   . ibuprofen (ADVIL) 800 MG tablet TAKE 1 TABLET(800 MG) BY MOUTH EVERY 8 HOURS AS NEEDED   . ipratropium-albuterol (DUONEB) 0.5-2.5 (3) MG/3ML SOLN TAKE 3 MLS BY NEBULIZATION EVERY 6 (SIX) HOURS AS NEEDED (SHORTNESS OF BREATH).   [name] leflunomide (ARAVA) 20 MG tablet TAKE 1 TABLET(20 MG) BY MOUTH DAILY   . loratadine (CLARITIN) 10 MG tablet Take 1 tablet by mouth daily as needed for allergies.    [name] losartan (COZAAR) 100 MG tablet TAKE 1 TABLET(100 MG) BY MOUTH DAILY   . montelukast (SINGULAIR) 10 MG tablet TAKE 1 TABLET(10 MG) BY MOUTH AT BEDTIME   . predniSONE (DELTASONE) 5 MG tablet Take by mouth.   . zolpidem (AMBIEN) 10 MG tablet Take 10 mg by mouth at bedtime.    . Levonorgestrel-Ethinyl Estradiol (AMETHIA) 0.15-0.03 &0.01 MG tablet Take 1 tablet by mouth at bedtime.   [name] spironolactone (ALDACTONE) 25 MG tablet Take 25 mg by mouth daily.   . [DISCONTINUED] ALPRAZolam (XANAX) 1 MG tablet Take 1 mg by mouth 2 (two) times daily as needed for anxiety. (Patient not taking: Reported on 06/11/2023)   . [DISCONTINUED] amphetamine-dextroamphetamine (  ADDERALL) 20 MG tablet Take 20 mg by mouth 3 (three) times daily. (Patient not taking: Reported on 06/11/2023)   . [DISCONTINUED] amphetamine-dextroamphetamine (ADDERALL) 30 MG tablet Take 1 tablet by mouth 2 (two) times daily. (Patient not taking: Reported on 06/11/2023) 06/11/2023: hospitalization for stroke   No facility-administered medications prior to visit.    Review of Systems  Constitutional:  Positive for activity change (sleeps "all the time"per pt daughter) and fatigue. Negative for appetite change, chills and fever.  HENT:  Positive for hearing loss.    Respiratory:  Negative for chest tightness and shortness of breath.   Cardiovascular:  Negative for chest pain and palpitations.  Gastrointestinal:  Negative for abdominal pain, nausea and vomiting.  Neurological:  Negative for dizziness and weakness.    {Insert previous labs (optional):23779} {See past labs  Heme  Chem  Endocrine  Serology  Results Review (optional):1}   Objective    BP 120/71   Pulse 73   Temp 98.3 F (36.8 C) (Oral)   Ht 5\' 1"  (1.549 m)   Wt 159 lb 8 oz (72.3 kg)   SpO2 95%   BMI 30.14 kg/m  {Insert last BP/Wt (optional):23777}{See vitals history (optional):1}   Physical Exam Constitutional:      Appearance: Normal appearance.  HENT:     Head: Normocephalic and atraumatic.  Eyes:     General: No scleral icterus.    Extraocular Movements: Extraocular movements intact.     Conjunctiva/sclera: Conjunctivae normal.  Cardiovascular:     Rate and Rhythm: Normal rate and regular rhythm.     Pulses: Normal pulses.     Heart sounds: Normal heart sounds.  Pulmonary:     Effort: Pulmonary effort is normal. No respiratory distress.     Breath sounds: Normal breath sounds.  Musculoskeletal:     Right lower leg: No edema.     Left lower leg: No edema.  Skin:    General: Skin is warm and dry.  Neurological:     Mental Status: She is alert and oriented to person, place, and time. Mental status is at baseline.  Psychiatric:        Mood and Affect: Mood normal.        Behavior: Behavior normal.     No results found for any visits on 06/11/23.  Assessment & Plan    Hospital discharge follow-up  Abnormal vaginal bleeding  Primary hypertension Assessment & Plan: - Continue home amlodipine 10mg  daily  - Continue home losartan 100 mg  - Continue home spironolactone 25mg  daily    Rheumatoid arthritis involving multiple sites with positive rheumatoid factor (HCC) Assessment & Plan: - Continue prednisone 5mg  daily  - Continue leflunomide 20mg  daily   - Continuing sulfasalazine 500mg  BID    Chronic obstructive pulmonary disease, unspecified COPD type (HCC) Assessment & Plan: Continue Breztri 2 puffs twice daily Continue albuterol 1 to 2 puffs every 6 hours as needed Continue DuoNebs 3 mL every 6 hours as needed   Seizure disorder [facility]) Assessment & Plan: Following with neurology, Rolla Plate, NP.  Will defer to their management. Per neurology note 06/06/2023, will continue lacosamide 100 mg twice daily through January 2025, or longer as determined by their follow-up visit at that time.   Nicotine dependence with current use -     Ambulatory Referral for Lung Cancer Scre  History of cerebrovascular accident (CVA) with residual deficit     ***  Return in about 3 months (around 09/11/2023).  I discussed the assessment and treatment plan with the patient  The patient was provided an opportunity to ask questions and all were answered. The patient agreed with the plan and demonstrated an understanding of the instructions.   The patient was advised to call back or seek an in-person evaluation if the symptoms worsen or if the condition fails to improve as anticipated.    Sherlyn Hay, DO  [facility] [phone] (phone) [phone] (fax)  [facility]

## 2023-06-11 NOTE — Assessment & Plan Note (Addendum)
No acute concerns.  Continue to monitor. Continue Breztri 2 puffs twice daily Continue albuterol 1 to 2 puffs every 6 hours as needed Continue DuoNebs 3 mL every 6 hours as needed

## 2023-06-11 NOTE — Assessment & Plan Note (Addendum)
No acute concerns.  Continue to monitor. - Continue home amlodipine 10mg  daily  - Continue home losartan 100 mg  - Continue home spironolactone 25mg  daily

## 2023-06-11 NOTE — Patient Instructions (Addendum)
Follow-up with your OB/GYN, Dr. Brennan Bailey ([facility]) to discuss the possibility of a hysterectomy versus continuing the birth control, particularly in light of the vaping and having a stroke recently, especially given the uncertainty surrounding the cause.   Follow-up with Dr. Evelene Croon regarding her ADHD treatment as well  Referred to speech therapy, occupational therapy and physical therapy by: Emmie Niemann, Rosalio Macadamia, MD  [address]  [place], [place] [address]  Phone: tel:[phone]  [phone]  All are currently pending review (not yet approved).

## 2023-06-22 NOTE — Assessment & Plan Note (Signed)
Patient has been and remains on combination oral birth control pills and continues to actively vape nicotine-containing vape cartridges.  - Discussed with patient that this places her at increased risk for blood clots and she should follow up as soon as possible with her OBGYN to discuss alternative options to control her menorrhagia (such as a hysterectomy), as a definitive cause her stroke was never fully identified.

## 2023-06-22 NOTE — Assessment & Plan Note (Signed)
Patient continues to have trouble with speaking and balance, as well as significant difficulty with hearing. Patient's home health has not yet been authorized to her insurance.  Gave her information to contact the prescribing physician/referral coordinator.   Stroke thought to be related to possible cardioembolism; however, no vegetations were noted on echocardiogram. Patient continues to follow with neurology, Rolla Plate, NP.  Will defer to their management.

## 2023-06-23 ENCOUNTER — Encounter: Payer: Self-pay | Admitting: Family Medicine

## 2023-06-27 DIAGNOSIS — I5020 Unspecified systolic (congestive) heart failure: Secondary | ICD-10-CM | POA: Diagnosis not present

## 2023-06-27 DIAGNOSIS — I639 Cerebral infarction, unspecified: Secondary | ICD-10-CM | POA: Diagnosis not present

## 2023-07-04 DIAGNOSIS — R29818 Other symptoms and signs involving the nervous system: Secondary | ICD-10-CM | POA: Diagnosis not present

## 2023-07-04 DIAGNOSIS — Z8673 Personal history of transient ischemic attack (TIA), and cerebral infarction without residual deficits: Secondary | ICD-10-CM | POA: Diagnosis not present

## 2023-07-04 DIAGNOSIS — I5020 Unspecified systolic (congestive) heart failure: Secondary | ICD-10-CM | POA: Diagnosis not present

## 2023-07-04 DIAGNOSIS — I10 Essential (primary) hypertension: Secondary | ICD-10-CM | POA: Diagnosis not present

## 2023-07-29 DIAGNOSIS — E782 Mixed hyperlipidemia: Secondary | ICD-10-CM | POA: Diagnosis not present

## 2023-07-29 DIAGNOSIS — Z8673 Personal history of transient ischemic attack (TIA), and cerebral infarction without residual deficits: Secondary | ICD-10-CM | POA: Diagnosis not present

## 2023-07-29 DIAGNOSIS — I2510 Atherosclerotic heart disease of native coronary artery without angina pectoris: Secondary | ICD-10-CM | POA: Diagnosis not present

## 2023-07-29 DIAGNOSIS — I10 Essential (primary) hypertension: Secondary | ICD-10-CM | POA: Diagnosis not present

## 2023-07-29 DIAGNOSIS — I429 Cardiomyopathy, unspecified: Secondary | ICD-10-CM | POA: Diagnosis not present

## 2023-07-29 DIAGNOSIS — R0609 Other forms of dyspnea: Secondary | ICD-10-CM | POA: Diagnosis not present

## 2023-08-02 NOTE — Progress Notes (Unsigned)
Established patient visit  Patient: Susan Gilmore   DOB: [birth year]   57 y.o. Female  MRN: [id number] Visit Date: 08/04/2023  Today's healthcare provider: Debera Lat, PA-C   No chief complaint on file.  Subjective    HPI  *** Discussed the use of AI scribe software for clinical note transcription with the patient, who gave verbal consent to proceed.  History of Present Illness               06/11/2023    1:47 PM 02/05/2022    8:30 AM 10/08/2021    8:15 AM  Depression screen PHQ 2/9  Decreased Interest 3 0 1  Down, Depressed, Hopeless 2 0 1  PHQ - 2 Score 5 0 2  Altered sleeping 0 2 2  Tired, decreased energy 3 2 1   Change in appetite 0 1 1  Feeling bad or failure about yourself  0 0 1  Trouble concentrating 0 1 1  Moving slowly or fidgety/restless 3 0 1  Suicidal thoughts 0 0 1  PHQ-9 Score 11 6 10   Difficult doing work/chores Very difficult Somewhat difficult Somewhat difficult      06/11/2023    1:50 PM  GAD 7 : Generalized Anxiety Score  Nervous, Anxious, on Edge 0  Control/stop worrying 0  Worry too much - different things 0  Trouble relaxing 0  Restless 0  Easily annoyed or irritable 3  Afraid - awful might happen 0  Total GAD 7 Score 3  Anxiety Difficulty Not difficult at all    Medications: Outpatient Medications Prior to Visit  Medication Sig   albuterol (VENTOLIN HFA) 108 (90 Base) MCG/ACT inhaler INHALE 1 TO 2 PUFFS INTO THE LUNGS EVERY 6 HOURS AS NEEDED FOR WHEEZING OR SHORTNESS OF BREATH   amLODipine (NORVASC) 10 MG tablet Take 1 tablet (10 mg total) by mouth daily.   aspirin EC 81 MG tablet Take 1 tablet (81 mg total) by mouth daily.   atorvastatin (LIPITOR) 40 MG tablet TAKE 1 TABLET BY MOUTH DAILY   baclofen (LIORESAL) 10 MG tablet Take 1 tablet (10 mg total) by mouth daily as needed for muscle spasms.   BREZTRI AEROSPHERE 160-9-4.8 MCG/ACT AERO INHALE 2 PUFFS INTO THE LUNGS TWICE DAILY   DULoxetine (CYMBALTA) 60 MG capsule Take 120 mg  by mouth daily.    fluocinonide cream (LIDEX) 0.05 %    fluticasone (FLONASE) 50 MCG/ACT nasal spray Place 2 sprays into both nostrils daily. (Patient taking differently: Place 2 sprays into both nostrils 2 (two) times daily.)   FLUZONE QUADRIVALENT 0.5 ML injection    HUMIRA PEN 40 MG/0.4ML PNKT    hydrochlorothiazide (MICROZIDE) 12.5 MG capsule TAKE 1 CAPSULE(12.5 MG) BY MOUTH DAILY   ibuprofen (ADVIL) 800 MG tablet TAKE 1 TABLET(800 MG) BY MOUTH EVERY 8 HOURS AS NEEDED   ipratropium-albuterol (DUONEB) 0.5-2.5 (3) MG/3ML SOLN TAKE 3 MLS BY NEBULIZATION EVERY 6 (SIX) HOURS AS NEEDED (SHORTNESS OF BREATH).   leflunomide (ARAVA) 20 MG tablet TAKE 1 TABLET(20 MG) BY MOUTH DAILY   Levonorgestrel-Ethinyl Estradiol (AMETHIA) 0.15-0.03 &0.01 MG tablet Take 1 tablet by mouth at bedtime.   loratadine (CLARITIN) 10 MG tablet Take 1 tablet by mouth daily as needed for allergies.    losartan (COZAAR) 100 MG tablet TAKE 1 TABLET(100 MG) BY MOUTH DAILY   montelukast (SINGULAIR) 10 MG tablet TAKE 1 TABLET(10 MG) BY MOUTH AT BEDTIME   predniSONE (DELTASONE) 5 MG tablet Take by mouth.   spironolactone (ALDACTONE) 25 MG  tablet Take 25 mg by mouth daily.   zolpidem (AMBIEN) 10 MG tablet Take 10 mg by mouth at bedtime.    No facility-administered medications prior to visit.    Review of Systems Except see HPI   {Insert previous labs (optional):23779} {See past labs  Heme  Chem  Endocrine  Serology  Results Review (optional):1}   Objective    There were no vitals taken for this visit. {Insert last BP/Wt (optional):23777}{See vitals history (optional):1}   Physical Exam   No results found for any visits on 08/04/23.  Assessment & Plan    *** Assessment and Plan              No follow-ups on file.      [facility]

## 2023-08-03 DIAGNOSIS — Z8673 Personal history of transient ischemic attack (TIA), and cerebral infarction without residual deficits: Secondary | ICD-10-CM | POA: Diagnosis not present

## 2023-08-03 DIAGNOSIS — Z9049 Acquired absence of other specified parts of digestive tract: Secondary | ICD-10-CM | POA: Diagnosis not present

## 2023-08-03 DIAGNOSIS — R7989 Other specified abnormal findings of blood chemistry: Secondary | ICD-10-CM | POA: Diagnosis not present

## 2023-08-03 DIAGNOSIS — F419 Anxiety disorder, unspecified: Secondary | ICD-10-CM | POA: Diagnosis not present

## 2023-08-03 DIAGNOSIS — I509 Heart failure, unspecified: Secondary | ICD-10-CM | POA: Diagnosis not present

## 2023-08-03 DIAGNOSIS — J9601 Acute respiratory failure with hypoxia: Secondary | ICD-10-CM | POA: Diagnosis not present

## 2023-08-03 DIAGNOSIS — R778 Other specified abnormalities of plasma proteins: Secondary | ICD-10-CM | POA: Diagnosis not present

## 2023-08-03 DIAGNOSIS — R0602 Shortness of breath: Secondary | ICD-10-CM | POA: Diagnosis not present

## 2023-08-03 DIAGNOSIS — Z1152 Encounter for screening for COVID-19: Secondary | ICD-10-CM | POA: Diagnosis not present

## 2023-08-03 DIAGNOSIS — Z20822 Contact with and (suspected) exposure to covid-19: Secondary | ICD-10-CM | POA: Diagnosis not present

## 2023-08-03 DIAGNOSIS — F17290 Nicotine dependence, other tobacco product, uncomplicated: Secondary | ICD-10-CM | POA: Diagnosis not present

## 2023-08-03 DIAGNOSIS — H9190 Unspecified hearing loss, unspecified ear: Secondary | ICD-10-CM | POA: Diagnosis not present

## 2023-08-03 DIAGNOSIS — F909 Attention-deficit hyperactivity disorder, unspecified type: Secondary | ICD-10-CM | POA: Diagnosis not present

## 2023-08-03 DIAGNOSIS — I110 Hypertensive heart disease with heart failure: Secondary | ICD-10-CM | POA: Diagnosis not present

## 2023-08-03 DIAGNOSIS — Z888 Allergy status to other drugs, medicaments and biological substances status: Secondary | ICD-10-CM | POA: Diagnosis not present

## 2023-08-03 DIAGNOSIS — J441 Chronic obstructive pulmonary disease with (acute) exacerbation: Secondary | ICD-10-CM | POA: Diagnosis not present

## 2023-08-03 DIAGNOSIS — E785 Hyperlipidemia, unspecified: Secondary | ICD-10-CM | POA: Diagnosis not present

## 2023-08-04 ENCOUNTER — Ambulatory Visit (INDEPENDENT_AMBULATORY_CARE_PROVIDER_SITE_OTHER): Payer: Medicare HMO | Admitting: Physician Assistant

## 2023-08-04 ENCOUNTER — Other Ambulatory Visit: Payer: Self-pay | Admitting: Physician Assistant

## 2023-08-04 ENCOUNTER — Encounter: Payer: Self-pay | Admitting: Physician Assistant

## 2023-08-04 VITALS — BP 143/82 | HR 76 | Temp 98.8°F | Ht 60.0 in | Wt 163.0 lb

## 2023-08-04 DIAGNOSIS — J449 Chronic obstructive pulmonary disease, unspecified: Secondary | ICD-10-CM

## 2023-08-04 DIAGNOSIS — I10 Essential (primary) hypertension: Secondary | ICD-10-CM | POA: Diagnosis not present

## 2023-08-04 DIAGNOSIS — F319 Bipolar disorder, unspecified: Secondary | ICD-10-CM

## 2023-08-04 DIAGNOSIS — M62838 Other muscle spasm: Secondary | ICD-10-CM

## 2023-08-04 DIAGNOSIS — J4551 Severe persistent asthma with (acute) exacerbation: Secondary | ICD-10-CM

## 2023-08-04 MED ORDER — IPRATROPIUM-ALBUTEROL 0.5-2.5 (3) MG/3ML IN SOLN
3.0000 mL | Freq: Four times a day (QID) | RESPIRATORY_TRACT | 0 refills | Status: DC | PRN
Start: 2023-08-04 — End: 2023-09-09

## 2023-08-05 ENCOUNTER — Telehealth: Payer: Self-pay | Admitting: *Deleted

## 2023-08-05 LAB — COMPREHENSIVE METABOLIC PANEL
ALT: 21 [IU]/L (ref 0–32)
AST: 18 [IU]/L (ref 0–40)
Albumin: 4.3 g/dL (ref 3.8–4.9)
Alkaline Phosphatase: 95 [IU]/L (ref 44–121)
BUN/Creatinine Ratio: 23 (ref 9–23)
BUN: 17 mg/dL (ref 6–24)
Bilirubin Total: 0.3 mg/dL (ref 0.0–1.2)
CO2: 20 mmol/L (ref 20–29)
Calcium: 9.3 mg/dL (ref 8.7–10.2)
Chloride: 104 mmol/L (ref 96–106)
Creatinine, Ser: 0.75 mg/dL (ref 0.57–1.00)
Globulin, Total: 2.5 g/dL (ref 1.5–4.5)
Glucose: 95 mg/dL (ref 70–99)
Potassium: 4.4 mmol/L (ref 3.5–5.2)
Sodium: 140 mmol/L (ref 134–144)
Total Protein: 6.8 g/dL (ref 6.0–8.5)
eGFR: 94 mL/min/{1.73_m2} (ref >59)

## 2023-08-05 LAB — CBC WITH DIFFERENTIAL/PLATELET
Basophils Absolute: 0.1 10*3/uL (ref 0.0–0.2)
Basos: 1 %
EOS (ABSOLUTE): 0 10*3/uL (ref 0.0–0.4)
Eos: 0 %
Hematocrit: 38.7 % (ref 34.0–46.6)
Hemoglobin: 12.5 g/dL (ref 11.1–15.9)
Immature Grans (Abs): 0 10*3/uL (ref 0.0–0.1)
Immature Granulocytes: 0 %
Lymphocytes Absolute: 1.1 10*3/uL (ref 0.7–3.1)
Lymphs: 12 %
MCH: 30.4 pg (ref 26.6–33.0)
MCHC: 32.3 g/dL (ref 31.5–35.7)
MCV: 94 fL (ref 79–97)
Monocytes Absolute: 0.7 10*3/uL (ref 0.1–0.9)
Monocytes: 7 %
Neutrophils Absolute: 7.7 10*3/uL — ABNORMAL HIGH (ref 1.4–7.0)
Neutrophils: 80 %
Platelets: 241 10*3/uL (ref 150–450)
RBC: 4.11 x10E6/uL (ref 3.77–5.28)
RDW: 12.7 % (ref 11.7–15.4)
WBC: 9.7 10*3/uL (ref 3.4–10.8)

## 2023-08-05 LAB — MAGNESIUM: Magnesium: 2 mg/dL (ref 1.6–2.3)

## 2023-08-05 NOTE — Progress Notes (Signed)
  Care Coordination  Outreach Note  08/05/2023 Name: MAHLANI DIRKSEN MRN: [id number] DOB: 1966/04/02   Care Coordination Outreach Attempts: An unsuccessful telephone outreach was attempted today to offer the patient information about available care coordination services.  Follow Up Plan:  Additional outreach attempts will be made to offer the patient care coordination information and services.   Encounter Outcome:  No Answer  Burman Nieves, CCMA Care Coordination Care Guide Direct Dial: [phone]

## 2023-08-05 NOTE — Progress Notes (Signed)
All labs seems wnl and stable for you. Advised to drink plenty of water, adhere to healthy diet and stay active as tolerated. Please, follow-up with your primary and repeat labs during your next visit.

## 2023-08-06 ENCOUNTER — Encounter: Payer: Self-pay | Admitting: Family Medicine

## 2023-08-07 NOTE — Progress Notes (Signed)
  Care Coordination  Outreach Note  08/07/2023 Name: MOIRA DIGUGLIELMO MRN: [id number] DOB: March 23, 1966   Care Coordination Outreach Attempts: A second unsuccessful outreach was attempted today to offer the patient with information about available care coordination services.  Follow Up Plan:  Additional outreach attempts will be made to offer the patient care coordination information and services.   Encounter Outcome:  No Answer  Burman Nieves, CCMA Care Coordination Care Guide Direct Dial: [phone]

## 2023-08-08 NOTE — Progress Notes (Signed)
  Care Coordination  Outreach Note  08/08/2023 Name: Susan Gilmore MRN: [id number] DOB: 23-Dec-1965   Care Coordination Outreach Attempts: A third unsuccessful outreach was attempted today to offer the patient with information about available care coordination services.  Follow Up Plan:  No further outreach attempts will be made at this time. We have been unable to contact the patient to offer or enroll patient in care coordination services  Encounter Outcome:  No Answer  Burman Nieves, [facility] Care Coordination Care Guide Direct Dial: [phone]

## 2023-08-18 NOTE — Telephone Encounter (Signed)
Advised to see pulmonology or your primary before the next refill

## 2023-09-05 DIAGNOSIS — Z87898 Personal history of other specified conditions: Secondary | ICD-10-CM | POA: Diagnosis not present

## 2023-09-05 DIAGNOSIS — I10 Essential (primary) hypertension: Secondary | ICD-10-CM | POA: Diagnosis not present

## 2023-09-05 DIAGNOSIS — Z8673 Personal history of transient ischemic attack (TIA), and cerebral infarction without residual deficits: Secondary | ICD-10-CM | POA: Diagnosis not present

## 2023-09-05 DIAGNOSIS — I5020 Unspecified systolic (congestive) heart failure: Secondary | ICD-10-CM | POA: Diagnosis not present

## 2023-09-05 DIAGNOSIS — H9192 Unspecified hearing loss, left ear: Secondary | ICD-10-CM | POA: Diagnosis not present

## 2023-09-05 DIAGNOSIS — Z1331 Encounter for screening for depression: Secondary | ICD-10-CM | POA: Diagnosis not present

## 2023-09-05 DIAGNOSIS — R569 Unspecified convulsions: Secondary | ICD-10-CM | POA: Diagnosis not present

## 2023-09-07 ENCOUNTER — Other Ambulatory Visit: Payer: Self-pay | Admitting: Physician Assistant

## 2023-09-07 DIAGNOSIS — J4551 Severe persistent asthma with (acute) exacerbation: Secondary | ICD-10-CM

## 2023-09-09 NOTE — Telephone Encounter (Signed)
 Requested medication (s) are due for refill today:   Yes  Requested medication (s) are on the active medication list:   Yes  Future visit scheduled:   Yes with Dr. Donzella in 3 days   Last ordered: 08/04/2023 360 ml, 0 refills  Pt requesting a 90 day supply.   Returned for Dr. Greggory review since has an appt in 3 days.     Requested Prescriptions  Pending Prescriptions Disp Refills   ipratropium-albuterol  (DUONEB) 0.5-2.5 (3) MG/3ML SOLN [Pharmacy Med Name: IPRATROPI/ALB 0.5/3MG  INH SOLUTION] 1,080 mL     Sig: USE 3 ML VIA NEBULIZER EVERY 6 HOURS AS NEEDED FOR SHORTNESS OF BREATH     Pulmonology:  Combination Products - albuterol  / ipratropium Failed - 09/09/2023 11:25 AM      Failed - Last BP in normal range    BP Readings from Last 1 Encounters:  08/04/23 (!) 143/82         Passed - Last Heart Rate in normal range    Pulse Readings from Last 1 Encounters:  08/04/23 76         Passed - Valid encounter within last 12 months    Recent Outpatient Visits           1 month ago Chronic obstructive pulmonary disease, unspecified COPD type (HCC)   [facility] [place], [place], PA-C   3 months ago Hospital discharge follow-up   [facility] [place], Lauraine SAILOR, DO   1 year ago Abnormal vaginal bleeding   [facility] Cyndi Shaver, PA-C   1 year ago Chronic obstructive pulmonary disease, unspecified COPD type [facility]   [facility] Cyndi Shaver, PA-C   2 years ago Post-menopausal bleeding   [facility] Cyndi Shaver, PA-C       Future Appointments             In 3 days [place], Lauraine SAILOR, DO [facility]

## 2023-09-12 ENCOUNTER — Ambulatory Visit: Payer: Medicare HMO | Admitting: Family Medicine

## 2023-09-15 DIAGNOSIS — L400 Psoriasis vulgaris: Secondary | ICD-10-CM | POA: Diagnosis not present

## 2023-09-15 DIAGNOSIS — M0579 Rheumatoid arthritis with rheumatoid factor of multiple sites without organ or systems involvement: Secondary | ICD-10-CM | POA: Diagnosis not present

## 2023-09-15 DIAGNOSIS — M25551 Pain in right hip: Secondary | ICD-10-CM | POA: Diagnosis not present

## 2023-09-15 DIAGNOSIS — Z79899 Other long term (current) drug therapy: Secondary | ICD-10-CM | POA: Diagnosis not present

## 2023-09-23 ENCOUNTER — Other Ambulatory Visit: Payer: Self-pay | Admitting: Family Medicine

## 2023-09-23 DIAGNOSIS — I10 Essential (primary) hypertension: Secondary | ICD-10-CM

## 2023-09-23 DIAGNOSIS — J449 Chronic obstructive pulmonary disease, unspecified: Secondary | ICD-10-CM

## 2023-09-23 NOTE — Telephone Encounter (Signed)
Medication Refill -  Most Recent Primary Care Visit:  Provider: Debera Lat  Department: [facility]  Visit Type: OFFICE VISIT  Date: 08/04/2023  Medication:   BREZTRI AEROSPHERE 160-9-4.8 MCG/ACT AERO  losartan (COZAAR) 100 MG tablet   Has the patient contacted their pharmacy? No (Agent: If no, request that the patient contact the pharmacy for the refill. If patient does not wish to contact the pharmacy document the reason why and proceed with request.) (Agent: If yes, when and what did the pharmacy advise?)  Is this the correct pharmacy for this prescription? Yes  This is the patient's preferred pharmacy:   [id number] D[place]atha Martin, [place] - [address] AT [facility] [address] [place] [address] Phone: [phone] Fax: [phone]   Has the prescription been filled recently? Yes  Is the patient out of the medication? Yes  Has the patient been seen for an appointment in the last year OR does the patient have an upcoming appointment? Yes  Can we respond through MyChart? No  Agent: Please be advised that Rx refills may take up to 3 business days. We ask that you follow-up with your pharmacy.

## 2023-09-23 NOTE — Telephone Encounter (Signed)
Requested medication (s) are due for refill today: yes  Requested medication (s) are on the active medication list: yes  Last refill:  01/31/23  Future visit scheduled: yes  Notes to clinic:  Medication not assigned to a protocol, review manually.       Requested Prescriptions  Pending Prescriptions Disp Refills   Budeson-Glycopyrrol-Formoterol (BREZTRI AEROSPHERE) 160-9-4.8 MCG/ACT AERO 10.7 g 3    Sig: Inhale 2 puffs into the lungs 2 (two) times daily.     Off-Protocol Failed - 09/23/2023  3:28 PM      Failed - Medication not assigned to a protocol, review manually.      Passed - Valid encounter within last 12 months    Recent Outpatient Visits           1 month ago Chronic obstructive pulmonary disease, unspecified COPD type (HCC)   [place] [facility] [place], [place], PA-C   3 months ago Hospital discharge follow-up   [facility] [place], Monico Blitz, DO   1 year ago Abnormal vaginal bleeding   [place] [facility] Alfredia Ferguson, PA-C   1 year ago Chronic obstructive pulmonary disease, unspecified COPD type [facility])   [place] [facility] Alfredia Ferguson, PA-C   2 years ago Post-menopausal bleeding   [facility] Alfredia Ferguson, PA-C               losartan (COZAAR) 100 MG tablet 30 tablet 0     Cardiovascular:  Angiotensin Receptor Blockers Failed - 09/23/2023  3:28 PM      Failed - Last BP in normal range    BP Readings from Last 1 Encounters:  08/04/23 (!) 143/82         Passed - Cr in normal range and within 180 days    Creat  Date Value Ref Range Status  06/12/2017 0.94 0.50 - 1.10 mg/dL Final   Creatinine, Ser  Date Value Ref Range Status  08/04/2023 0.75 0.57 - 1.00 mg/dL Final         Passed - K in normal range and within 180 days    Potassium  Date Value Ref Range Status  08/04/2023 4.4 3.5 - 5.2 mmol/L Final  06/22/2014 3.6 3.5 - 5.1 mmol/L  Final         Passed - Patient is not pregnant      Passed - Valid encounter within last 6 months    Recent Outpatient Visits           1 month ago Chronic obstructive pulmonary disease, unspecified COPD type (HCC)   [place] [facility] [place], [place], PA-C   3 months ago Hospital discharge follow-up   [facility] Sherlyn Hay, DO   1 year ago Abnormal vaginal bleeding   [place] [facility] Alfredia Ferguson, PA-C   1 year ago Chronic obstructive pulmonary disease, unspecified COPD type [facility])   [address] [place] Alfredia Ferguson, PA-C   2 years ago Post-menopausal bleeding   [facility] Alfredia Ferguson, [place]

## 2023-09-24 ENCOUNTER — Telehealth: Payer: Self-pay

## 2023-09-24 DIAGNOSIS — F319 Bipolar disorder, unspecified: Secondary | ICD-10-CM

## 2023-09-24 DIAGNOSIS — I5020 Unspecified systolic (congestive) heart failure: Secondary | ICD-10-CM

## 2023-09-24 DIAGNOSIS — J45909 Unspecified asthma, uncomplicated: Secondary | ICD-10-CM

## 2023-09-24 DIAGNOSIS — I6930 Unspecified sequelae of cerebral infarction: Secondary | ICD-10-CM

## 2023-09-24 DIAGNOSIS — M0579 Rheumatoid arthritis with rheumatoid factor of multiple sites without organ or systems involvement: Secondary | ICD-10-CM

## 2023-09-24 MED ORDER — LOSARTAN POTASSIUM 100 MG PO TABS: 100.0000 mg | ORAL_TABLET | Freq: Every day | ORAL | 3 refills | Status: AC

## 2023-09-24 MED ORDER — BREZTRI AEROSPHERE 160-9-4.8 MCG/ACT IN AERO: 2.0000 | INHALATION_SPRAY | Freq: Two times a day (BID) | RESPIRATORY_TRACT | 3 refills | Status: DC

## 2023-09-24 NOTE — Telephone Encounter (Signed)
Copied from CRM [id number]. Topic: Referral - Question >> Sep 23, 2023  1:10 PM Priscille Loveless wrote: Reason for CRM: Pts daughter is requesting to have a case worker call her and talk to her about herself being her mothers care giver. She has quit her job to stay at home with her but she is trying to get information about that. Please advise.

## 2023-09-26 NOTE — Addendum Note (Signed)
Addended by: Jacquenette Shone on: 09/26/2023 05:07 PM   Modules accepted: Orders

## 2023-09-29 ENCOUNTER — Telehealth: Payer: Self-pay | Admitting: *Deleted

## 2023-09-29 NOTE — Progress Notes (Unsigned)
Complex Care Management Note Care Guide Note  09/29/2023 Name: Susan Gilmore MRN: [id number] DOB: 07-Jun-1966   Complex Care Management Outreach Attempts: An unsuccessful telephone outreach was attempted today to offer the patient information about available complex care management services.  Follow Up Plan:  Additional outreach attempts will be made to offer the patient complex care management information and services.   Encounter Outcome:  No Answer  Burman Nieves, CMA, Care Guide [facility]  [facility], [facility] Guide Direct Dial: [phone]  Fax: [phone] Website: [email or web address]

## 2023-10-01 NOTE — Progress Notes (Signed)
 Complex Care Management Note Care Guide Note  10/01/2023 Name: Susan Gilmore MRN: [id number] DOB: 10-13-1965   Complex Care Management Outreach Attempts: A second unsuccessful outreach was attempted today to offer the patient with information about available complex care management services.  Follow Up Plan:  Additional outreach attempts will be made to offer the patient complex care management information and services.   Encounter Outcome:  No Answer  Thedford Franks, CMA, Care Guide [facility]  [facility], [facility] Guide Direct Dial: [phone]  Fax: [phone] Website: [email or web address]

## 2023-10-02 NOTE — Progress Notes (Signed)
 Complex Care Management Note Care Guide Note  10/02/2023 Name: Susan Gilmore MRN: [id number] DOB: 06-24-1966   Complex Care Management Outreach Attempts: A third unsuccessful outreach was attempted today to offer the patient with information about available complex care management services.  Follow Up Plan:  No further outreach attempts will be made at this time. We have been unable to contact the patient to offer or enroll patient in complex care management services.  Encounter Outcome:  No Answer  Thedford Franks, CMA, Care Guide [facility]  [facility], [facility] Guide Direct Dial: [phone]  Fax: [phone] Website: [email or web address]

## 2023-10-08 ENCOUNTER — Ambulatory Visit: Payer: Self-pay

## 2023-10-08 ENCOUNTER — Encounter: Payer: Self-pay | Admitting: Nurse Practitioner

## 2023-10-08 ENCOUNTER — Telehealth (INDEPENDENT_AMBULATORY_CARE_PROVIDER_SITE_OTHER): Payer: Medicare HMO | Admitting: Nurse Practitioner

## 2023-10-08 DIAGNOSIS — J449 Chronic obstructive pulmonary disease, unspecified: Secondary | ICD-10-CM

## 2023-10-08 DIAGNOSIS — R6889 Other general symptoms and signs: Secondary | ICD-10-CM

## 2023-10-08 MED ORDER — BENZONATATE 100 MG PO CAPS: 200.0000 mg | ORAL_CAPSULE | Freq: Three times a day (TID) | ORAL | 0 refills | Status: DC | PRN

## 2023-10-08 MED ORDER — PREDNISONE 10 MG (21) PO TBPK: ORAL_TABLET | ORAL | 0 refills | Status: DC

## 2023-10-08 MED ORDER — OSELTAMIVIR PHOSPHATE 75 MG PO CAPS: 75.0000 mg | ORAL_CAPSULE | Freq: Two times a day (BID) | ORAL | 0 refills | Status: AC

## 2023-10-08 NOTE — Progress Notes (Signed)
Name: Susan Gilmore   MRN: [id number]    DOB: September 01, 1965   Date:10/08/2023       Progress Note  Subjective  Chief Complaint  Chief Complaint  Patient presents with   URI    Runny nose sore throat and cough since Monday fever started last night.  Family has had flu    I connected with  SARIAH HENKIN  on 10/08/23 at  3:00 PM EST by a video enabled telemedicine application and verified that I am speaking with the correct person using two identifiers.  I discussed the limitations of evaluation and management by telemedicine and the availability of in person appointments. The patient expressed understanding and agreed to proceed with a virtual visit  Staff also discussed with the patient that there may be a patient responsible charge related to this service. Patient Location: home Provider Location: [facility] Additional Individuals present: np student  HPI  Discussed the use of AI scribe software for clinical note transcription with the patient, who gave verbal consent to proceed.  History of Present Illness   The patient, with a history of rheumatoid arthritis and chronic obstructive pulmonary disease, presents with flu-like symptoms including runny nose, sore throat, and fever. She denies any shortness of breath. The symptoms started on Monday morning and have persisted, with the onset of fever noted last night. Over-the-counter medications, including Alka-Seltzer Plus and cold and flu medication, have been administered but have not alleviated the symptoms. The patient has been exposed to individuals with the flu. The patient's COPD is a concern, as she is prone to developing pneumonia. The patient has been taking prednisone as needed for RA, but is currently out of the medication.    Patient Active Problem List   Diagnosis Date Noted   Seizure disorder (HCC) 06/11/2023   Nicotine dependence with current use 06/11/2023   History of cerebrovascular accident (CVA) with residual deficit  06/11/2023   Plaque psoriasis 02/18/2023   Psoriatic arthritis (HCC) 02/18/2023   HFrEF (heart failure with reduced ejection fraction) (HCC) 02/12/2023   Abnormal vaginal bleeding 02/05/2022   Hyperlipidemia 07/05/2021   Chronic obstructive pulmonary disease (HCC) 09/08/2020   Current chronic use of systemic steroids 12/16/2019   Rheumatoid arthritis involving multiple sites (HCC) 12/16/2019   OSA on CPAP 05/03/2019   Coronary artery disease due to calcified coronary lesion 10/23/2018   Reactive airway disease without complication 06/05/2015   Allergic rhinitis 03/15/2015   ADD (attention deficit disorder) 12/29/2014   Bipolar disorder (HCC) 12/29/2014   Carpal tunnel syndrome 12/29/2014   Chronic anxiety 12/29/2014   Acid reflux 12/29/2014   Benign neoplasm of colon 12/29/2014   Vitamin D deficiency 12/29/2014   Paroxysmal digital cyanosis 02/17/2014   Ventricular bigeminy 01/14/2012   Primary hypertension 01/14/2012    Social History   Tobacco Use   Smoking status: Former    Current packs/day: 0.00    Average packs/day: 1 pack/day for 26.0 years (26.0 ttl pk-yrs)    Types: Cigarettes    Start date: 07/27/1987    Quit date: 07/26/2013    Years since quitting: 10.2   Smokeless tobacco: Never  Substance Use Topics   Alcohol use: No    Alcohol/week: 0.0 standard drinks of alcohol    Comment: Had a history of alcohol use but currently is not drinking any     Current Outpatient Medications:    albuterol (VENTOLIN HFA) 108 (90 Base) MCG/ACT inhaler, INHALE 1 TO 2 PUFFS INTO THE LUNGS EVERY  6 HOURS AS NEEDED FOR WHEEZING OR SHORTNESS OF BREATH, Disp: 8.5 g, Rfl: 0   amLODipine (NORVASC) 10 MG tablet, Take 1 tablet (10 mg total) by mouth daily., Disp: 90 tablet, Rfl: 3   aspirin EC 81 MG tablet, Take 1 tablet (81 mg total) by mouth daily., Disp: 90 tablet, Rfl: 3   atorvastatin (LIPITOR) 40 MG tablet, TAKE 1 TABLET BY MOUTH DAILY, Disp: 90 tablet, Rfl: 0   baclofen (LIORESAL)  10 MG tablet, Take 1 tablet (10 mg total) by mouth daily as needed for muscle spasms., Disp: 30 each, Rfl: 0   benzonatate (TESSALON PERLES) 100 MG capsule, Take 2 capsules (200 mg total) by mouth 3 (three) times daily as needed for cough., Disp: 20 capsule, Rfl: 0   Budeson-Glycopyrrol-Formoterol (BREZTRI AEROSPHERE) 160-9-4.8 MCG/ACT AERO, Inhale 2 puffs into the lungs 2 (two) times daily., Disp: 10.7 g, Rfl: 3   DULoxetine (CYMBALTA) 60 MG capsule, Take 120 mg by mouth daily. , Disp: , Rfl:    fluocinonide cream (LIDEX) 0.05 %, , Disp: , Rfl:    fluticasone (FLONASE) 50 MCG/ACT nasal spray, Place 2 sprays into both nostrils daily. (Patient taking differently: Place 2 sprays into both nostrils 2 (two) times daily.), Disp: 16 g, Rfl: 11   FLUZONE QUADRIVALENT 0.5 ML injection, , Disp: , Rfl:    HUMIRA PEN 40 MG/0.4ML PNKT, , Disp: , Rfl:    hydrochlorothiazide (MICROZIDE) 12.5 MG capsule, TAKE 1 CAPSULE(12.5 MG) BY MOUTH DAILY, Disp: 90 capsule, Rfl: 0   ibuprofen (ADVIL) 800 MG tablet, TAKE 1 TABLET(800 MG) BY MOUTH EVERY 8 HOURS AS NEEDED, Disp: 30 tablet, Rfl: 0   ipratropium-albuterol (DUONEB) 0.5-2.5 (3) MG/3ML SOLN, USE 3 ML VIA NEBULIZER EVERY 6 HOURS AS NEEDED FOR SHORTNESS OF BREATH, Disp: 1080 mL, Rfl: 3   leflunomide (ARAVA) 20 MG tablet, TAKE 1 TABLET(20 MG) BY MOUTH DAILY, Disp: 90 tablet, Rfl: 0   loratadine (CLARITIN) 10 MG tablet, Take 1 tablet by mouth daily as needed for allergies. , Disp: , Rfl:    losartan (COZAAR) 100 MG tablet, Take 1 tablet (100 mg total) by mouth daily., Disp: 90 tablet, Rfl: 3   montelukast (SINGULAIR) 10 MG tablet, TAKE 1 TABLET(10 MG) BY MOUTH AT BEDTIME, Disp: 90 tablet, Rfl: 1   oseltamivir (TAMIFLU) 75 MG capsule, Take 1 capsule (75 mg total) by mouth 2 (two) times daily for 5 days., Disp: 10 capsule, Rfl: 0   predniSONE (DELTASONE) 5 MG tablet, Take by mouth., Disp: , Rfl:    predniSONE (STERAPRED UNI-PAK 21 TAB) 10 MG (21) TBPK tablet, Take as  directed on package.  (60 mg po on day 1, 50 mg po on day 2...), Disp: 21 tablet, Rfl: 0   spironolactone (ALDACTONE) 25 MG tablet, Take 25 mg by mouth daily., Disp: , Rfl:    zolpidem (AMBIEN) 10 MG tablet, Take 10 mg by mouth at bedtime. , Disp: , Rfl: 0   Levonorgestrel-Ethinyl Estradiol (AMETHIA) 0.15-0.03 &0.01 MG tablet, Take 1 tablet by mouth at bedtime., Disp: 84 tablet, Rfl: 3  Allergies  Allergen Reactions   Effexor [Venlafaxine Hcl] Swelling   Isosorbide Nitrate Other (See Comments)    Reaction: Bad headache    I personally reviewed active problem list, medication list, allergies with the patient/caregiver today.  ROS  Ten systems reviewed and is negative except as mentioned in HPI   Objective  Virtual encounter, vitals not obtained.  There is no height or weight on file to calculate  BMI.  Nursing Note and Vital Signs reviewed.  Physical Exam  Awake, alert and oriented, speaking in complete sentences   No results found for this or any previous visit (from the past 72 hours).  Assessment & Plan  Assessment and Plan    Influenza-like illness Symptoms of runny nose, sore throat, and fever started on Monday. Exposed to family members with confirmed flu. Over-the-counter treatments have not been effective. -Prescribe Tamiflu to lessen the duration and severity of flu symptoms. -tessalon perls and steroid taper -can also take allergy pill like zyrtec or claritin, flonase and mucinex  -Continue over-the-counter treatments as needed for symptom relief.  COPD Patient has a history of COPD and is currently on Albuterol, Breztri, and DuoNeb. Concern for potential pneumonia due to flu-like symptoms and history of frequent pneumonia. -Prescribe a steroid taper to help manage COPD and flu-like symptoms. -Continue current COPD medications.   Follow-up Monitor symptoms and hydration status. Contact healthcare provider with any concerns or worsening symptoms.       Can  take tylenol for fever. Push fluids and get rest  -Red flags and when to present for emergency care or RTC including fever >101.53F, chest pain, shortness of breath, new/worsening/un-resolving symptoms,  reviewed with patient at time of visit. Follow up and care instructions discussed and provided in AVS. - I discussed the assessment and treatment plan with the patient. The patient was provided an opportunity to ask questions and all were answered. The patient agreed with the plan and demonstrated an understanding of the instructions.  I provided 20 minutes of non-face-to-face time during this encounter.  Berniece Salines, FNP

## 2023-10-08 NOTE — Telephone Encounter (Signed)
Message from [place] B sent at 10/08/2023  1:20 PM EST  Summary: feverish, cough, runny nose   Pt's daughter called in , says pt is feverish, cough, runny nose         Chief Complaint: cough, flu exposure pt with COPD Symptoms: fever onset last night , sore throat, dry cough O2 sats 95% Frequency: sx Monday Fever last night Pertinent Negatives: Patient denies increased SOB, vomiting, diarrhea Disposition: [] ED /[] Urgent Care (no appt availability in office) / [x] Appointment(In office/virtual)/ []  [place] [facility]/ [] Home Care/ [] Refused Recommended Disposition /[]  Mobile Bus/ []  Follow-up with PCP Additional Notes: assisted daughter with sign up for MyChart  Reason for Disposition  [1] Influenza EXPOSURE (Close Contact) within last 48 hours (2 days) AND [2] exposed person is HIGH RISK (e.g., age > 64 years, pregnant, HIV+, chronic medical condition)  Answer Assessment - Initial Assessment Questions 1. TYPE of EXPOSURE: "How were you exposed?" (e.g., close contact, not a close contact)     Family members 2. DATE of EXPOSURE: "When did the exposure occur?" (e.g., hour, days, weeks)     Unsure sx started Monday  3. PREGNANCY: "Is there any chance you are pregnant?" "When was your last menstrual period?"     N/a 4. HIGH RISK for COMPLICATIONS: "Do you have any heart or lung problems?" "Do you have a weakened immune system?" (e.g., CHF, COPD, asthma, HIV positive, chemotherapy, renal failure, diabetes mellitus, sickle cell anemia)     COPD 5. SYMPTOMS: "Do you have any symptoms?" (e.g., cough, fever, sore throat, difficulty breathing).     Fever last night.  Dry Cough, sore throat, runny nose, denies SOB  Protocols used: Influenza (Flu) Exposure-A-AH

## 2023-11-19 ENCOUNTER — Ambulatory Visit: Payer: Self-pay

## 2023-11-19 ENCOUNTER — Encounter: Payer: Self-pay | Admitting: *Deleted

## 2023-11-19 NOTE — Telephone Encounter (Signed)
 Patient daughter called back to request appt for sx or "talking weird" , dizziness and per pt daughter does have to hold things at times to walk. Sx worsening x 2 weeks . Advised patient and pt daughter to go to ED for evaluation and rationale why. Patient continues to refuse. Called [facility] for assist and PCP is not available and recommends to go to ED.

## 2023-11-19 NOTE — Telephone Encounter (Signed)
 Information obtained from daughter.  Chief Complaint: garbled speech, increased dizziness Symptoms: speech problems, dizziness,  Frequency:  about 2 weeks  Disposition: [x] ED /[] Urgent Care (no appt availability in office) / [] Appointment(In office/virtual)/ []  [place] [facility]/ [] Home Care/ [] Refused Recommended Disposition /[] [place] [facility]/ []  Follow-up with PCP Additional Notes: Daughter states that she is having more dizziness episodes. Per daughter she is having more episodes and more significant. Per daughter pt is also struggling to speak clearly. Pt denies any currently numbness nor weakness. Pt with hx of stroke. Pt advised to go to ED. Daughter agreeable.   Copied from CRM [id number]. Topic: Clinical - Red Word Triage >> Nov 19, 2023 11:02 AM Nyra Capes wrote: Red Word that prompted transfer to Nurse Triage: patient daughter, Burnadette Peter calling in. Patient gets dizzy when standing and walking, all of the sudden patient gets dizzy, this has gotten worse in the past 2 weeks, Speech is coming out all mixed up. Patient had stroke in 10/24 Reason for Disposition  SEVERE dizziness (e.g., unable to stand, requires support to walk, feels like passing out now)  Protocols used: Dizziness - Lightheadedness-A-AH

## 2023-11-19 NOTE — Telephone Encounter (Signed)
 Copied from CRM [id number]. Topic: Clinical - Red Word Triage >> Nov 19, 2023 11:23 AM Fredrich Romans wrote: Red Word that prompted transfer to Nurse Triage: dizziness,talking weird ,refused ED which was advised by first Triage nurse This encounter was created in error - please disregard.

## 2023-11-20 ENCOUNTER — Ambulatory Visit: Admitting: Family Medicine

## 2023-11-20 NOTE — Progress Notes (Deleted)
 Established patient visit   Patient: Susan Gilmore   DOB: 1965/10/08   58 y.o. Female  MRN: [id number] Visit Date: 11/20/2023  Today's healthcare provider: Sherlyn Hay, DO   No chief complaint on file.  Subjective    HPI NO SHOW    Speech therapy?  ***  [facility]  Referring Provider: Florina Ou, PA   Primary care Provider: Truitt Merle, PA  HPI - Susan Gilmore is a 58 y.o. 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 She has been on aspirin 81 mg daily and atorvastatin 40 mg daily for secondary stroke prevention with no new evidence of stroke. She is here with her daughter in law.  I last saw her in November 2024 with complains of numbness in the left face, arm ad leg as well as mild left leg weakness. History of was complicated by her language deficits She subsequently had a brain MRI which was negative for new acute process. She has since done well and her language, strength as well as sensory deficits have all improved. She has been taking her medications consistently without any missed doses and her family is helping manage them. She is able to complete her ADLs but needs help with her iADLs. She is ambulatory without assistive device. Her daughter in law reports an assisted fall about 3 weeks ago with some trembling but no overt shaking and no bowel or bladder incontinence and no confusion afterwards. Patient says she  lost her balance while turning to get something out of the fridge. She has not had any falls. She continues to complain of decreased hearing in the left ear. She declined speech therapy for her language and cognition deficits and declined PT as well per the family.   Her prior stroke workup included vessel imaging with CTA head and neck 04/2023 which was degraded and nearly non-diagnostic but concerning for a distal left A3/4 occlusion. The CTA head and neck from June 2024 had shown a distal left M2/proximal M3 occlusion. Brain MRI revealed an acute L ACA infarct and an acute R MCA infarct, suggesting cardioembolic etiology. She was found to have Staph hominis bacteremia but no mycotic aneurysms seen on CTA and TTE/TEE negative for thrombus or vegetations. A 30 day cardiac monitor was negative for occult atrial fibrillation/flutter. She had hypercoagulability workup completed was unremarkable (Antithrombin activity assay, protein S, protein C, anticoag cardiolipin, lupus anticoagulant, factor V Leiden). A CT chest, abdomen and pelvis was negative for occult malignancy. Other stroke risk stratification included LDL 52, A1c 5.5%, vitamin B1 was within normal limits, vitamin D was within normal limits. ***    {History (Optional):23778}  Medications: Outpatient Medications Prior to Visit  Medication Sig  . albuterol (VENTOLIN HFA) 108 (90 Base) MCG/ACT inhaler INHALE 1 TO 2 PUFFS INTO THE LUNGS EVERY 6 HOURS AS NEEDED FOR WHEEZING OR SHORTNESS OF BREATH  . amLODipine (NORVASC) 10 MG tablet Take 1 tablet (10 mg total) by mouth daily.  [name] aspirin EC 81 MG  tablet Take 1 tablet (81 mg total) by mouth daily.  [name] atorvastatin (LIPITOR) 40 MG tablet TAKE 1 TABLET BY MOUTH DAILY  . baclofen (LIORESAL) 10 MG tablet Take 1 tablet (10 mg total) by mouth daily as needed for muscle spasms.  . benzonatate (TESSALON PERLES) 100 MG capsule Take 2 capsules (200 mg total) by mouth 3 (three) times daily as needed for cough.   . Budeson-Glycopyrrol-Formoterol (BREZTRI AEROSPHERE) 160-9-4.8 MCG/ACT AERO Inhale 2 puffs into the lungs 2 (two) times daily.  . DULoxetine (CYMBALTA) 60 MG capsule Take 120 mg by mouth daily.   . fluocinonide cream (LIDEX) 0.05 %   . fluticasone (FLONASE) 50 MCG/ACT nasal spray Place 2 sprays into both nostrils daily. (Patient taking differently: Place 2 sprays into both nostrils 2 (two) times daily.)  . FLUZONE QUADRIVALENT 0.5 ML injection   . HUMIRA PEN 40 MG/0.4ML PNKT   . hydrochlorothiazide (MICROZIDE) 12.5 MG capsule TAKE 1 CAPSULE(12.5 MG) BY MOUTH DAILY  . ibuprofen (ADVIL) 800 MG tablet TAKE 1 TABLET(800 MG) BY MOUTH EVERY 8 HOURS AS NEEDED  . ipratropium-albuterol (DUONEB) 0.5-2.5 (3) MG/3ML SOLN USE 3 ML VIA NEBULIZER EVERY 6 HOURS AS NEEDED FOR SHORTNESS OF BREATH  . leflunomide (ARAVA) 20 MG tablet TAKE 1 TABLET(20 MG) BY MOUTH DAILY  . Levonorgestrel-Ethinyl Estradiol (AMETHIA) 0.15-0.03 &0.01 MG tablet Take 1 tablet by mouth at bedtime.  [name] loratadine (CLARITIN) 10 MG tablet Take 1 tablet by mouth daily as needed for allergies.   [name] losartan (COZAAR) 100 MG tablet Take 1 tablet (100 mg total) by mouth daily.  . montelukast (SINGULAIR) 10 MG tablet TAKE 1 TABLET(10 MG) BY MOUTH AT BEDTIME  . predniSONE (DELTASONE) 5 MG tablet Take by mouth.  . predniSONE (STERAPRED UNI-PAK 21 TAB) 10 MG (21) TBPK tablet Take as directed on package.  (60 mg po on day 1, 50 mg po on day 2...)  . spironolactone (ALDACTONE) 25 MG tablet Take 25 mg by mouth daily.  [name] zolpidem (AMBIEN) 10 MG tablet Take 10 mg by mouth at bedtime.    No facility-administered medications prior to visit.    Review of Systems ***  {Insert previous labs (optional):23779} {See past labs  Heme  Chem  Endocrine  Serology  Results Review (optional):1}   Objective    There were no vitals taken for this visit. {Insert last BP/Wt (optional):23777}{See vitals history (optional):1}   Physical Exam   No results  found for any visits on 11/20/23.  Assessment & Plan    There are no diagnoses linked to this encounter.  ***  No follow-ups on file.      I discussed the assessment and treatment plan with the patient  The patient was provided an opportunity to ask questions and all were answered. The patient agreed with the plan and demonstrated an understanding of the instructions.   The patient was advised to call back or seek an in-person evaluation if the symptoms worsen or if the condition fails to improve as anticipated.    Sherlyn Hay, DO  [facility] [phone] (phone) [phone] (fax)  [facility]

## 2024-01-09 DIAGNOSIS — R569 Unspecified convulsions: Secondary | ICD-10-CM | POA: Diagnosis not present

## 2024-01-09 DIAGNOSIS — H9192 Unspecified hearing loss, left ear: Secondary | ICD-10-CM | POA: Diagnosis not present

## 2024-01-09 DIAGNOSIS — M069 Rheumatoid arthritis, unspecified: Secondary | ICD-10-CM | POA: Diagnosis not present

## 2024-01-09 DIAGNOSIS — I2510 Atherosclerotic heart disease of native coronary artery without angina pectoris: Secondary | ICD-10-CM | POA: Diagnosis not present

## 2024-01-09 DIAGNOSIS — I10 Essential (primary) hypertension: Secondary | ICD-10-CM | POA: Diagnosis not present

## 2024-01-09 DIAGNOSIS — Z1331 Encounter for screening for depression: Secondary | ICD-10-CM | POA: Diagnosis not present

## 2024-01-09 DIAGNOSIS — L4050 Arthropathic psoriasis, unspecified: Secondary | ICD-10-CM | POA: Diagnosis not present

## 2024-01-09 DIAGNOSIS — Z8673 Personal history of transient ischemic attack (TIA), and cerebral infarction without residual deficits: Secondary | ICD-10-CM | POA: Diagnosis not present

## 2024-01-09 DIAGNOSIS — Z87898 Personal history of other specified conditions: Secondary | ICD-10-CM | POA: Diagnosis not present

## 2024-01-26 ENCOUNTER — Other Ambulatory Visit: Payer: Self-pay | Admitting: Physician Assistant

## 2024-01-26 DIAGNOSIS — I2510 Atherosclerotic heart disease of native coronary artery without angina pectoris: Secondary | ICD-10-CM

## 2024-02-16 DIAGNOSIS — Z7960 Long term (current) use of unspecified immunomodulators and immunosuppressants: Secondary | ICD-10-CM | POA: Diagnosis not present

## 2024-02-16 DIAGNOSIS — M0579 Rheumatoid arthritis with rheumatoid factor of multiple sites without organ or systems involvement: Secondary | ICD-10-CM | POA: Diagnosis not present

## 2024-02-16 DIAGNOSIS — L4050 Arthropathic psoriasis, unspecified: Secondary | ICD-10-CM | POA: Diagnosis not present

## 2024-02-16 NOTE — Progress Notes (Signed)
 Rheumatology Follow Up Note  Chief Complaint  Patient presents with  . Rheumatoid Arthritis      Subjective:Hip Pain  Pertinent negatives include no numbness.   Susan Gilmore is a 58 y.o. female is here today for follow up of rheumatoid arthritis stage 2 moderate,plaque psoriasis. The patient's allergies, current medications, past family history, past medical history, past social history, past surgical history and problem list were reviewed and updated as appropriate.   She is taking the leflunomide , SSZ. She also takes prednisone  to help the inflammation as needed. She has no swelling of the hand joints. Apart from her feet she has no other joint pains.    She has significant plaque psoriasis of the feet. She has pain of her feet. She does have pruritus. She is currently not taking the Taltz. She is off the Rosalio and is approved. She states that her phone does not work.   She has no fever or infection or nasal/oral ulcer or dyspnea or paresthesia or seizers.   Review of Systems:   Review of Systems  Constitutional:  Negative for fatigue.  HENT:  Negative for mouth sores and trouble swallowing.        Neg: Dry Mouth  Eyes:  Negative for redness.       Neg: Dry Eyes  Respiratory:  Positive for shortness of breath. Negative for cough.   Cardiovascular:  Negative for chest pain and leg swelling.  Gastrointestinal:  Negative for constipation, diarrhea and nausea.  Endocrine: Negative for cold intolerance and heat intolerance.  Genitourinary:  Negative for hematuria.  Musculoskeletal:        Per HPI  Skin:  Positive for rash. Negative for color change.  Neurological:  Negative for dizziness, weakness, numbness and headaches.  Hematological:  Does not bruise/bleed easily.  Psychiatric/Behavioral:  Negative for dysphoric mood and sleep disturbance. The patient is not nervous/anxious.   All other systems reviewed and are negative.   Objective:  Vitals:   02/16/24 1357  BP:  128/78  Temp: 36.6 C (97.8 F)  TempSrc: Temporal  Weight: 74.4 kg (164 lb)  Height: 152.4 cm (5')  PainSc: 0-No pain     Length of Stiffness: several hours  GEN - Pleasant, No Apparent Distress  HEENT - normocephalic and atraumatic. Conjunctiva Clear.  No Nasal/Oral Ulcer Neck - supple with no adenopathy or thyromegaly.   C spine with full range of motion. Heart - regular rate and rhythm, No murmurs/gallops/rub, Nml S1S2 Lungs - clear to auscultation in all fields. Extremities - there is no cyanosis or edema.  Neurological - alert and oriented.  Spine - no paraspinal tenderness; L Spine with pain Skin - mild erythema of both elbows, Plantar region with dry skin, feet with erythema; nail with dystrophic changes with significant psoriasis  MSK - The following joints were examined bilaterally: Hands, Wrists, Elbows, Shoulders, Metatarsals, Ankles, Knees and Hips; they were normal apart from what is noted.   100% Fist Formation Mild DIP and PIP Enlargement  Right Hip with Good Range of motion  No Synovitis or Tenderness of the hand joints No Dactylitis No Tender Point Gait Fluent  Labs/Imaging Reviewed in EMR Cr 1.0, Ast 29, ALT 34 CBC Hgb 12.0, Hct 38.1 TSH 2.8 ESR 16 Pos: RF 261.4, AntiCCP>250 Neg: Hep B and C, Quantiferon   Hand Xray: No Erosions Feet Xray: No Erosions, mild degenerative changes   CDAI is calculated as follows:  CDAI = SJC + TJC +PGA + EGA = 4  Where:  SJC = Swollen Joint Count TJC = Tender Joint Count PGA = Patient Global Assessment of Disease Activity EGA = Evaluator Global Assessment of Disease Activity  Interpretation <=2.8: Remission >2.8 and <=10: Low Disease Activity >10 and <=22: Moderate Disease Activity >22: High Disease Activity    Assessment and Plan   1) Rheumatoid Arthritis, Stage 2 Moderate -- Diagnosed around 2010; was initially a patient at [facility] -- Has been on Enbrel , Humira, Plaquenil  and Methotrexate   part of research study in the past -- Unable to afford Orencia infusion medication -- She was recently diagnosed with CVA (01/2023). -- Stop Humira -- Continue Leflunomide  20 mg Daily since April of 2021 -- Continue Prednisone  5 mg as needed  -- Continue SSZ 500 mg twice daily with food   2) Plaque Psoriasis, Psoriatic Arthritis : Active Psoriasis of feet but joints much better  -- Failed Humira and Methtorexate -- Fluocinonide Ointment  -- Continue Taltz (July 2024), This is somewhat better.   3) Long term use of steroids -- Take Calcium  and Vitamin D  twice daily   4) Long Term use of high risk medication -- Arava , SSZ and Taltz are immunosuppressive medication that require drug toxicity monitoring -- Patient has been educated on the potential adverse events associated with prednisone  including but not limited to elevated blood sugars, decrease bone density as well as glaucoma and mood affects. The patient is to keep regular follow up appointments so that patient can be monitored for the above adverse events.   -- Reviewed Labs  Diagnoses and all orders for this visit:  Psoriatic arthritis (CMS/HHS-HCC) -     predniSONE  (DELTASONE ) 5 MG tablet; Take 1 tablet (5 mg total) by mouth once daily After you finish the course of high dose prednisone   Rheumatoid arthritis involving multiple sites with positive rheumatoid factor (CMS/HHS-HCC) -     predniSONE  (DELTASONE ) 5 MG tablet; Take 1 tablet (5 mg total) by mouth once daily After you finish the course of high dose prednisone   Long-term use of immunosuppressant medication -     predniSONE  (DELTASONE ) 5 MG tablet; Take 1 tablet (5 mg total) by mouth once daily After you finish the course of high dose prednisone      Return in about 4 months (around 06/17/2024) for Routine Follow Up.   All new prescription medications, changes in current prescription dosages, and sample medications were discussed with the patient, including patient  education, medication name, use, dosage, potential side effects, drug interactions, consequences of not using/taking, and special instructions.  Patient expressed understanding.  No barriers to adherence.   I appreciate the opportunity to participate in the care of Susan Gilmore. Please do not hesitate to contact me with any questions or concerns that may arise in regards to the patient's rheumatologic disease.   I personally performed the service. (TP)  MAYUR LOREE BLANCH, MD

## 2024-02-19 ENCOUNTER — Encounter: Payer: Self-pay | Admitting: Family Medicine

## 2024-02-19 NOTE — Progress Notes (Unsigned)
 Tried contacting patient to schedule Medicare Annual Wellness Visit (AWV) and unable to reach patient. Please schedule an appointment at any time on the [facility] schedule Stefano ORN, CMA  [facility] AWV Team Direct Dial: [phone]

## 2024-02-19 NOTE — Telephone Encounter (Signed)
 See encounter notes for further documentation Stefano ORN, CMA  [facility] AWV Team Direct Dial: [phone]

## 2024-02-19 NOTE — Telephone Encounter (Signed)
 Called to scheduled AWV. No answer. Left vm with direct dial info. Stefano ORN, CMA  [facility] AWV Team Direct Dial: [phone]

## 2024-05-30 ENCOUNTER — Other Ambulatory Visit: Payer: Self-pay

## 2024-05-30 ENCOUNTER — Emergency Department: Admission: EM | Admit: 2024-05-30 | Discharge: 2024-05-30 | Disposition: A | Discharge status: Home / Self Care

## 2024-05-30 ENCOUNTER — Emergency Department

## 2024-05-30 DIAGNOSIS — R0602 Shortness of breath: Secondary | ICD-10-CM | POA: Diagnosis not present

## 2024-05-30 DIAGNOSIS — J441 Chronic obstructive pulmonary disease with (acute) exacerbation: Secondary | ICD-10-CM | POA: Insufficient documentation

## 2024-05-30 DIAGNOSIS — I10 Essential (primary) hypertension: Secondary | ICD-10-CM | POA: Insufficient documentation

## 2024-05-30 DIAGNOSIS — I5089 Other heart failure: Secondary | ICD-10-CM | POA: Insufficient documentation

## 2024-05-30 DIAGNOSIS — I700 Atherosclerosis of aorta: Secondary | ICD-10-CM | POA: Diagnosis not present

## 2024-05-30 HISTORY — DX: Chronic obstructive pulmonary disease, unspecified: J44.9

## 2024-05-30 HISTORY — DX: Atherosclerotic heart disease of native coronary artery without angina pectoris: I25.10

## 2024-05-30 LAB — CBC
HCT: 38 % (ref 36.0–46.0)
Hemoglobin: 12.7 g/dL (ref 12.0–15.0)
MCH: 29.6 pg (ref 26.0–34.0)
MCHC: 33.4 g/dL (ref 30.0–36.0)
MCV: 88.6 fL (ref 80.0–100.0)
Platelets: 193 K/uL (ref 150–400)
RBC: 4.29 MIL/uL (ref 3.87–5.11)
RDW: 14 % (ref 11.5–15.5)
WBC: 7.2 K/uL (ref 4.0–10.5)
nRBC: 0 % (ref 0.0–0.2)

## 2024-05-30 LAB — BASIC METABOLIC PANEL WITH GFR
Anion gap: 12 (ref 5–15)
BUN: 14 mg/dL (ref 6–20)
CO2: 21 mmol/L — ABNORMAL LOW (ref 22–32)
Calcium: 9.4 mg/dL (ref 8.9–10.3)
Chloride: 107 mmol/L (ref 98–111)
Creatinine, Ser: 0.82 mg/dL (ref 0.44–1.00)
GFR, Estimated: >60 mL/min (ref >60)
Glucose, Bld: 93 mg/dL (ref 70–99)
Potassium: 3.6 mmol/L (ref 3.5–5.1)
Sodium: 140 mmol/L (ref 135–145)

## 2024-05-30 LAB — TROPONIN I (HIGH SENSITIVITY)
Troponin I (High Sensitivity): 4 ng/L (ref <18)
Troponin I (High Sensitivity): 5 ng/L (ref <18)

## 2024-05-30 LAB — RESP PANEL BY RT-PCR (RSV, FLU A&B, COVID)  RVPGX2
Influenza A by PCR: NEGATIVE
Influenza B by PCR: NEGATIVE
Resp Syncytial Virus by PCR: NEGATIVE
SARS Coronavirus 2 by RT PCR: NEGATIVE

## 2024-05-30 LAB — BRAIN NATRIURETIC PEPTIDE: B Natriuretic Peptide: 44.2 pg/mL (ref 0.0–100.0)

## 2024-05-30 MED ORDER — PREDNISONE 20 MG PO TABS: 40.0000 mg | ORAL_TABLET | Freq: Every day | ORAL | 0 refills | Status: DC

## 2024-05-30 MED ORDER — METHYLPREDNISOLONE SODIUM SUCC 125 MG IJ SOLR
125.0000 mg | Freq: Once | INTRAMUSCULAR | Status: AC
  Administered 2024-05-30: 125 mg via INTRAVENOUS
  Filled 2024-05-30: qty 2

## 2024-05-30 MED ORDER — ACETAMINOPHEN 500 MG PO TABS
1000.0000 mg | ORAL_TABLET | Freq: Once | ORAL | Status: AC
Start: 2024-05-30 — End: 2024-05-30
  Administered 2024-05-30: 1000 mg via ORAL
  Filled 2024-05-30: qty 2

## 2024-05-30 MED ORDER — AZITHROMYCIN 250 MG PO TABS: ORAL_TABLET | ORAL | 0 refills | Status: AC

## 2024-05-30 MED ORDER — IPRATROPIUM-ALBUTEROL 0.5-2.5 (3) MG/3ML IN SOLN: 3.0000 mL | Freq: Once | RESPIRATORY_TRACT | Status: AC

## 2024-05-30 MED ORDER — ALBUTEROL SULFATE (2.5 MG/3ML) 0.083% IN NEBU
2.5000 mg | INHALATION_SOLUTION | Freq: Once | RESPIRATORY_TRACT | Status: AC
  Administered 2024-05-30: 2.5 mg via RESPIRATORY_TRACT
  Filled 2024-05-30: qty 3

## 2024-05-30 MED ORDER — ALBUTEROL SULFATE (2.5 MG/3ML) 0.083% IN NEBU
5.0000 mg | INHALATION_SOLUTION | Freq: Once | RESPIRATORY_TRACT | Status: AC
  Administered 2024-05-30: 5 mg via RESPIRATORY_TRACT
  Filled 2024-05-30: qty 6

## 2024-05-30 MED ORDER — IPRATROPIUM-ALBUTEROL 0.5-2.5 (3) MG/3ML IN SOLN
RESPIRATORY_TRACT | Status: AC
  Administered 2024-05-30: 3 mL via RESPIRATORY_TRACT
  Filled 2024-05-30: qty 3

## 2024-05-30 MED ORDER — AZITHROMYCIN 250 MG PO TABS: ORAL_TABLET | ORAL | 0 refills | Status: DC

## 2024-05-30 MED ORDER — IPRATROPIUM-ALBUTEROL 0.5-2.5 (3) MG/3ML IN SOLN
3.0000 mL | Freq: Once | RESPIRATORY_TRACT | Status: AC
  Administered 2024-05-30: 3 mL via RESPIRATORY_TRACT
  Filled 2024-05-30: qty 3

## 2024-05-30 MED ORDER — PREDNISONE 20 MG PO TABS: 40.0000 mg | ORAL_TABLET | Freq: Every day | ORAL | 0 refills | Status: AC

## 2024-05-30 MED ORDER — ALBUTEROL SULFATE HFA 108 (90 BASE) MCG/ACT IN AERS
2.0000 | INHALATION_SPRAY | Freq: Four times a day (QID) | RESPIRATORY_TRACT | 2 refills | Status: DC | PRN
Start: 2024-05-30 — End: 2024-05-30

## 2024-05-30 MED ORDER — ALBUTEROL SULFATE HFA 108 (90 BASE) MCG/ACT IN AERS: 2.0000 | INHALATION_SPRAY | Freq: Four times a day (QID) | RESPIRATORY_TRACT | 2 refills | Status: DC | PRN

## 2024-05-30 NOTE — ED Triage Notes (Signed)
 Pt to ED for SOB since yesterday, hx COPD. Duoneb started by other triage RN. Pt has audible wheezing. Endorses cough. Skin dry. Respirations labored.

## 2024-05-30 NOTE — Discharge Instructions (Signed)
 Your evaluation in the emergency department was notable for a COPD exacerbation.  This was treated with nebulizer treatments and steroids, your symptoms improved.  Your workup was otherwise reassuring.  I prescribed you a new albuterol  inhaler as well as a short course of antibiotics and steroids to help with your symptoms.  Please do follow-up closely with your primary care provider for reevaluation, and return to the emergency department with any new or worsening symptoms.

## 2024-05-30 NOTE — ED Provider Notes (Signed)
 [facility] Provider Note    Event Date/Time   First MD Initiated Contact with Patient 05/30/24 1814     (approximate)   History   Shortness of Breath  Pt to ED for SOB since yesterday, hx COPD. Duoneb started by other triage RN. Pt has audible wheezing. Endorses cough. Skin dry. Respirations labored.   HPI Susan Gilmore is a 58 y.o. female PMH OSA on CPAP, COPD, hypertension, anxiety, acid reflux, seizure disorder, HFrEF presents for evaluation of shortness of breath -Started last night, has been progressively worsening since then.  No frank chest pain though does feel diffuse tightness of her chest.  Mild nonproductive cough.  No fevers.  No abdominal pain. -Refractory to inhaler at home -No leg swelling     Physical Exam   Triage Vital Signs: ED Triage Vitals  Encounter Vitals Group     BP 05/30/24 1803 138/83     Girls Systolic BP Percentile --      Girls Diastolic BP Percentile --      Boys Systolic BP Percentile --      Boys Diastolic BP Percentile --      Pulse Rate 05/30/24 1803 66     Resp 05/30/24 1747 (!) 24     Temp 05/30/24 1810 98.6 F (37 C)     Temp Source 05/30/24 1810 Oral     SpO2 05/30/24 1747 98 %     Weight 05/30/24 1801 185 lb (83.9 kg)     Height 05/30/24 1801 5' 2 (1.575 m)     Head Circumference --      Peak Flow --      Pain Score 05/30/24 1759 0     Pain Loc --      Pain Education --      Exclude from Growth Chart --     Most recent vital signs: Vitals:   05/30/24 2221 05/30/24 2233  BP: (!) 148/123 (!) 117/54  Pulse: 72   Resp: 18   Temp: 98.3 F (36.8 C)   SpO2: 97%      General: Awake, + mild-moderate respiratory distress CV:  Good peripheral perfusion. RRR, RP 2+ Resp:  Tachypneic, 5 word sentences, diminished breath sounds throughout with end expiratory wheezing, no focal coarse breath sounds Abd:  No distention. Nontender to deep palpation throughout Other:  No appreciable lower extremity  edema bilaterally   ED Results / Procedures / Treatments   Labs (all labs ordered are listed, but only abnormal results are displayed) Labs Reviewed  BASIC METABOLIC PANEL WITH GFR - Abnormal; Notable for the following components:      Result Value   CO2 21 (*)    All other components within normal limits  RESP PANEL BY RT-PCR (RSV, FLU A&B, COVID)  RVPGX2  CBC  BRAIN NATRIURETIC PEPTIDE  TROPONIN I (HIGH SENSITIVITY)  TROPONIN I (HIGH SENSITIVITY)     EKG  See ED course below.   RADIOLOGY Radiology interpreted by myself and radiology report reviewed.  No acute pathology identified.    PROCEDURES:  Critical Care performed: No  Procedures   MEDICATIONS ORDERED IN ED: Medications  ipratropium-albuterol  (DUONEB) 0.5-2.5 (3) MG/3ML nebulizer solution 3 mL (3 mLs Nebulization Given by Other 05/30/24 1757)  ipratropium-albuterol  (DUONEB) 0.5-2.5 (3) MG/3ML nebulizer solution 3 mL (3 mLs Nebulization Given 05/30/24 1838)  albuterol  (PROVENTIL ) (2.5 MG/3ML) 0.083% nebulizer solution 2.5 mg (2.5 mg Nebulization Given 05/30/24 1846)  methylPREDNISolone  sodium succinate (SOLU-MEDROL ) 125 mg/2 mL injection  125 mg (125 mg Intravenous Given 05/30/24 1836)  acetaminophen  (TYLENOL ) tablet 1,000 mg (1,000 mg Oral Given 05/30/24 2017)  albuterol  (PROVENTIL ) (2.5 MG/3ML) 0.083% nebulizer solution 5 mg (5 mg Nebulization Given 05/30/24 2017)     IMPRESSION / MDM / ASSESSMENT AND PLAN / ED COURSE  I reviewed the triage vital signs and the nursing notes.                              DDX/MDM/AP: Differential diagnosis includes, but is not limited to, COPD exacerbation, consider underlying pneumonia, doubt ACS /PE /CHF based on history and exam.  Consider concomitant viral syndrome including influenza or COVID-19.  Plan: - DuoNeb - Chest x-ray -EKG - Methylprednisolone  - Reassess  Patient's presentation is most consistent with acute presentation with potential threat to life or  bodily function.  The patient is on the cardiac monitor to evaluate for evidence of arrhythmia and/or significant heart rate changes.  ED course below.  Patient feeling much better after serial rounds of nebulizer, IV steroids.  Presentation overall consistent with COPD exacerbation.  Discharged home with parents, azithromycin , albuterol .  Plan for PMD follow-up.  ED return precautions in place.  Patient agrees with plan.  Clinical Course as of 05/30/24 2254  [place] May 30, 2024  1909 CXR: IMPRESSION: 1. No active cardiopulmonary disease. 2.  Aortic Atherosclerosis (ICD10-I70.0).   [MM]  2005 CBC, BMP reviewed, unremarkable Troponin normal BNP normal [MM]  2005 Ecg = sinus rhythm, rate 58, no gross ST elevation or depression, T wave inversions noted in precordial leads, normal axis, normal intervals.  No clear evidence of ischemia nor arrhythmia on my interpretation.  Similar to prior from 06/2021. [MM]  2006 Patient reevaluated, breathing notably improved though does remain somewhat tachypneic.  Expiratory wheezing still present throughout.  Complaining of mild sternal tightness.  Satting 99-100% on room air.  Will give 1 further round of nebulizer treatment and then plan for ambulation trial to determine disposition. [MM]  2110 Patient reevaluated, notes she is breathing much better.  Still mildly tachypneic though notably improved from presentation.  Remains satting well on room air.  Will attempt ambulation trial [MM]  2251 Patient ambulated well, no hypoxia  Will proceed with discharge home with prednisone , albuterol , azithromycin  plan for PMD follow-up.  ED return precautions in place.  Patient recent plan. [MM]    Clinical Course User Index [MM] Clarine Ozell LABOR, MD     FINAL CLINICAL IMPRESSION(S) / ED DIAGNOSES   Final diagnoses:  COPD exacerbation (HCC)     Rx / DC Orders   ED Discharge Orders          Ordered    predniSONE  (DELTASONE ) 20 MG tablet  Daily with  breakfast        05/30/24 2253    azithromycin  (ZITHROMAX  Z-PAK) 250 MG tablet        05/30/24 2253    albuterol  (VENTOLIN  HFA) 108 (90 Base) MCG/ACT inhaler  Every 6 hours PRN        05/30/24 2253             Note:  This document was prepared using Dragon voice recognition software and may include unintentional dictation errors.   Clarine Ozell LABOR, MD 05/30/24 [phone]

## 2024-05-30 NOTE — ED Notes (Signed)
Patient in Xray at this time.

## 2024-06-16 DIAGNOSIS — M0579 Rheumatoid arthritis with rheumatoid factor of multiple sites without organ or systems involvement: Secondary | ICD-10-CM | POA: Diagnosis not present

## 2024-06-21 ENCOUNTER — Emergency Department
Admission: EM | Admit: 2024-06-21 | Discharge: 2024-06-21 | Disposition: A | Discharge status: Home / Self Care | Attending: Emergency Medicine | Admitting: Emergency Medicine

## 2024-06-21 ENCOUNTER — Other Ambulatory Visit: Payer: Self-pay

## 2024-06-21 ENCOUNTER — Emergency Department

## 2024-06-21 DIAGNOSIS — I2510 Atherosclerotic heart disease of native coronary artery without angina pectoris: Secondary | ICD-10-CM | POA: Diagnosis not present

## 2024-06-21 DIAGNOSIS — Z8673 Personal history of transient ischemic attack (TIA), and cerebral infarction without residual deficits: Secondary | ICD-10-CM | POA: Insufficient documentation

## 2024-06-21 DIAGNOSIS — I5020 Unspecified systolic (congestive) heart failure: Secondary | ICD-10-CM | POA: Insufficient documentation

## 2024-06-21 DIAGNOSIS — J449 Chronic obstructive pulmonary disease, unspecified: Secondary | ICD-10-CM | POA: Insufficient documentation

## 2024-06-21 DIAGNOSIS — I110 Hypertensive heart disease with heart failure: Secondary | ICD-10-CM | POA: Diagnosis not present

## 2024-06-21 DIAGNOSIS — W01198A Fall on same level from slipping, tripping and stumbling with subsequent striking against other object, initial encounter: Secondary | ICD-10-CM | POA: Insufficient documentation

## 2024-06-21 DIAGNOSIS — W19XXXA Unspecified fall, initial encounter: Secondary | ICD-10-CM

## 2024-06-21 DIAGNOSIS — M25521 Pain in right elbow: Secondary | ICD-10-CM | POA: Diagnosis not present

## 2024-06-21 DIAGNOSIS — S5001XA Contusion of right elbow, initial encounter: Secondary | ICD-10-CM | POA: Diagnosis not present

## 2024-06-21 NOTE — ED Triage Notes (Signed)
 Pt comes with right elbow pain after trip and fall this morning. Pt denies hitting her head or loc.

## 2024-06-21 NOTE — Discharge Instructions (Signed)
 Your x-ray was normal.  Please follow-up with your outpatient provider.  Please return for any new, worsening, or changing symptoms or other concerns.  It was a pleasure caring for you today.

## 2024-06-21 NOTE — ED Notes (Signed)
 See triage note  Presents s/p fall   States  she was getting something from the closet  Turned and fell  Having pain to right elbow  No deformity noted   Good pulses

## 2024-06-21 NOTE — ED Provider Notes (Signed)
 [facility] Provider Note    Event Date/Time   First MD Initiated Contact with Patient 06/21/24 1358     (approximate)   History   Fall   HPI  Susan Gilmore is a 58 y.o. female who presents today for evaluation after a mechanical fall that occurred prior to arrival.  She reports that she lost her balance in the closet and hit her elbow on the wall.  She denies head strike or LOC.  She denies feeling dizzy, lightheaded, weak, or otherwise symptomatic prior to the fall.  No nausea or vomiting.  She reports that she now feels back to her baseline.  She presents with her son.  Patient Active Problem List   Diagnosis Date Noted   Seizure disorder (HCC) 06/11/2023   Nicotine dependence with current use 06/11/2023   History of cerebrovascular accident (CVA) with residual deficit 06/11/2023   Plaque psoriasis 02/18/2023   Psoriatic arthritis (HCC) 02/18/2023   HFrEF (heart failure with reduced ejection fraction) (HCC) 02/12/2023   Abnormal vaginal bleeding 02/05/2022   Hyperlipidemia 07/05/2021   Chronic obstructive pulmonary disease (HCC) 09/08/2020   Current chronic use of systemic steroids 12/16/2019   Rheumatoid arthritis involving multiple sites (HCC) 12/16/2019   OSA on CPAP 05/03/2019   Coronary artery disease due to calcified coronary lesion 10/23/2018   Reactive airway disease without complication 06/05/2015   Allergic rhinitis 03/15/2015   ADD (attention deficit disorder) 12/29/2014   Bipolar disorder (HCC) 12/29/2014   Carpal tunnel syndrome 12/29/2014   Chronic anxiety 12/29/2014   Acid reflux 12/29/2014   Benign neoplasm of colon 12/29/2014   Vitamin D  deficiency 12/29/2014   Paroxysmal digital cyanosis 02/17/2014   Ventricular bigeminy 01/14/2012   Primary hypertension 01/14/2012          Physical Exam   Triage Vital Signs: ED Triage Vitals [06/21/24 1312]  Encounter Vitals Group     BP 120/65     Girls Systolic BP Percentile       Girls Diastolic BP Percentile      Boys Systolic BP Percentile      Boys Diastolic BP Percentile      Pulse Rate 69     Resp 18     Temp 97.9 F (36.6 C)     Temp src      SpO2 99 %     Weight      Height      Head Circumference      Peak Flow      Pain Score      Pain Loc      Pain Education      Exclude from Growth Chart     Most recent vital signs: Vitals:   06/21/24 1312  BP: 120/65  Pulse: 69  Resp: 18  Temp: 97.9 F (36.6 C)  SpO2: 99%    Physical Exam Vitals and nursing note reviewed.  Constitutional:      General: Awake and alert. No acute distress.    Appearance: Normal appearance. The patient is normal weight.  HENT:     Head: Normocephalic and atraumatic.     Mouth: Mucous membranes are moist.  Eyes:     General: PERRL. Normal EOMs        Right eye: No discharge.        Left eye: No discharge.     Conjunctiva/sclera: Conjunctivae normal.  Cardiovascular:     Rate and Rhythm: Normal rate    Pulses: Normal  pulses.  Pulmonary:     Effort: Pulmonary effort is normal. No respiratory distress.     Breath sounds: Normal breath sounds.  Abdominal:     Abdomen is soft. There is no abdominal tenderness. No rebound or guarding. No distention. Musculoskeletal:        General: No swelling. Normal range of motion.     Cervical back: Normal range of motion and neck supple. No midline cervical spine tenderness.  Full range of motion of neck.  Negative Spurling test.  Negative Lhermitte sign.  Normal strength and sensation in bilateral upper extremities. Normal grip strength bilaterally.  Normal intrinsic muscle function of the hand bilaterally.  Normal radial pulses bilaterally. Right elbow: Superficial abrasion noted.  No swelling or deformity noted.  Full and normal range of motion with active and passive range of motion at shoulder, elbow, wrist.  Full flexion and extension, normal supination and pronation against resistance noted.  Normal radial pulse.   Normal grip strength.  Normal intrinsic muscle function of the hand.  Compartments soft and compressible throughout. Skin:    General: Skin is warm and dry.     Capillary Refill: Capillary refill takes less than 2 seconds.     Findings: No rash.  Neurological:     Mental Status: The patient is awake and alert.      ED Results / Procedures / Treatments   Labs (all labs ordered are listed, but only abnormal results are displayed) Labs Reviewed - No data to display   EKG     RADIOLOGY I independently reviewed and interpreted imaging and agree with radiologists findings.     PROCEDURES:  Critical Care performed:   Procedures   MEDICATIONS ORDERED IN ED: Medications - No data to display   IMPRESSION / MDM / ASSESSMENT AND PLAN / ED COURSE  I reviewed the triage vital signs and the nursing notes.   Differential diagnosis includes, but is not limited to, contusion, abrasion, less likely fracture.  Patient is awake and alert, hemodynamically stable and neurovascularly intact.  She is nontoxic in appearance.  She has a normal neurological exam and denies any preceding symptoms.  She reports that she feels her normal self currently.  X-ray was obtained in triage and is negative for any acute findings.  Patient and son are reassured by these results today.  We discussed return precautions and outpatient follow-up.  Patient was discharged in stable condition.   Patient's presentation is most consistent with acute complicated illness / injury requiring diagnostic workup.   FINAL CLINICAL IMPRESSION(S) / ED DIAGNOSES   Final diagnoses:  Fall, initial encounter  Contusion of right elbow, initial encounter     Rx / DC Orders   ED Discharge Orders     None        Note:  This document was prepared using Dragon voice recognition software and may include unintentional dictation errors.   Chavy Avera E, PA-C 06/21/24 1434    Levander Slate, MD 06/21/24 1500

## 2024-07-26 ENCOUNTER — Ambulatory Visit: Payer: Self-pay

## 2024-07-26 ENCOUNTER — Other Ambulatory Visit: Payer: Self-pay | Admitting: Family Medicine

## 2024-07-26 DIAGNOSIS — J4551 Severe persistent asthma with (acute) exacerbation: Secondary | ICD-10-CM

## 2024-07-26 NOTE — Telephone Encounter (Signed)
 FYI Only or Action Required?: FYI only for provider: appointment scheduled on 07/28/24-added to wait list.  Patient was last seen in primary care on 10/08/2023 by Gareth Mliss FALCON, FNP.  Called Nurse Triage reporting Breathing Problem.  Symptoms began several days ago.  Interventions attempted: Prescription medications: albuterol , duoneb and Rest, hydration, or home remedies.  Symptoms are: unchanged.  Triage Disposition: See Physician Within 24 Hours  Patient/caregiver understands and will follow disposition?: Yes, but will wait    Copied from CRM #[id number]. Topic: Clinical - Red Word Triage >> Jul 26, 2024  3:38 PM Nessti S wrote: [facility] that prompted transfer to Nurse Triage: copd and is not breathing well; using nebulizer alot more   ----------------------------------------------------------------------- From previous Reason for Contact - Scheduling: Patient/patient representative is calling to schedule an appointment. Refer to attachments for appointment information. Reason for Disposition  [1] MILD difficulty breathing (e.g., minimal/no SOB at rest, SOB with walking) AND [2] worse than normal  Answer Assessment - Initial Assessment Questions Additional info: Patient son called in to schedule an appointment for worsening copd, also requesting refills-see refill encounter Son is not currently with patient at this time and will call back if she has any other symptoms not reported.  Scheduled next available appointment on 07/28/24.     1. MAIN CONCERN OR SYMPTOM: What's your main concern? (e.g., low oxygen level, breathing difficulty) What question do you have?     Breathing difficulty 2.  OXYGEN EQUIPMENT:  Are you having trouble with your oxygen equipment?  (e.g., cannula, mask, tubing, tank, concentrator)     No 3. ONSET: When did the  breathing difficulty  start?      Ongoing COPD 4. OXYGEN THERAPY:      No 5. PULSE OXIMETER:      no 6. O2 MONITORING: What is  the oxygen level (pulse ox reading)? (e.g., 70-100%)     no 7. VITAL SIGNS MONITORING: Do you monitor/measure your oxygen level or vital signs (e.g., yes, no, measurements are automatically sent to provider/call center). Document CURRENT and NORMAL BASELINE values if available.       Not monitoring 8. BREATHING DIFFICULTY: Are you having any difficulty breathing? If Yes, ask: How bad is it?  (e.g., none, mild, moderate, severe)      Breathing ok at this time 9. OTHER SYMPTOMS: Do you have any other symptoms? (e.g., fever, change in sputum)     Chronic congestion          [facility] [place]  Protocols used: COPD Oxygen Monitoring and Hypoxia-A-AH

## 2024-07-26 NOTE — Telephone Encounter (Signed)
 Requested medication (s) are due for refill today: Yes  Requested medication (s) are on the active medication list: Yes  Last refill:  05/30/24  Future visit scheduled: Yes  Notes to clinic:  Unable to refill per protocol, last refill by another provider.      Requested Prescriptions  Pending Prescriptions Disp Refills   albuterol  (VENTOLIN  HFA) 108 (90 Base) MCG/ACT inhaler 8 g 2    Sig: Inhale 2 puffs into the lungs every 6 (six) hours as needed for wheezing or shortness of breath.     Pulmonology:  Beta Agonists 2 Passed - 07/26/2024  4:34 PM      Passed - Last BP in normal range    BP Readings from Last 1 Encounters:  06/21/24 120/65         Passed - Last Heart Rate in normal range    Pulse Readings from Last 1 Encounters:  06/21/24 69         Passed - Valid encounter within last 12 months    Recent Outpatient Visits           9 months ago Flu-like symptoms   [facility] Gareth Clarity F, [place]               ipratropium-albuterol  (DUONEB) 0.5-2.5 (3) MG/3ML SOLN 1080 mL 3     Pulmonology:  Combination Products - albuterol  / ipratropium Passed - 07/26/2024  4:34 PM      Passed - Last BP in normal range    BP Readings from Last 1 Encounters:  06/21/24 120/65         Passed - Last Heart Rate in normal range    Pulse Readings from Last 1 Encounters:  06/21/24 69         Passed - Valid encounter within last 12 months    Recent Outpatient Visits           9 months ago Flu-like symptoms   [facility] Gareth Clarity FAL[place], [place]

## 2024-07-27 ENCOUNTER — Other Ambulatory Visit: Payer: Self-pay

## 2024-07-27 ENCOUNTER — Emergency Department
Admission: EM | Admit: 2024-07-27 | Discharge: 2024-07-27 | Disposition: A | Discharge status: Home / Self Care | Attending: Emergency Medicine | Admitting: Emergency Medicine

## 2024-07-27 ENCOUNTER — Emergency Department

## 2024-07-27 ENCOUNTER — Telehealth: Payer: Self-pay

## 2024-07-27 DIAGNOSIS — J441 Chronic obstructive pulmonary disease with (acute) exacerbation: Secondary | ICD-10-CM | POA: Insufficient documentation

## 2024-07-27 DIAGNOSIS — R0989 Other specified symptoms and signs involving the circulatory and respiratory systems: Secondary | ICD-10-CM | POA: Diagnosis not present

## 2024-07-27 DIAGNOSIS — R0602 Shortness of breath: Secondary | ICD-10-CM | POA: Diagnosis not present

## 2024-07-27 DIAGNOSIS — J4551 Severe persistent asthma with (acute) exacerbation: Secondary | ICD-10-CM

## 2024-07-27 DIAGNOSIS — J449 Chronic obstructive pulmonary disease, unspecified: Secondary | ICD-10-CM

## 2024-07-27 LAB — CBC
HCT: 38 % (ref 36.0–46.0)
Hemoglobin: 12.9 g/dL (ref 12.0–15.0)
MCH: 29.1 pg (ref 26.0–34.0)
MCHC: 33.9 g/dL (ref 30.0–36.0)
MCV: 85.6 fL (ref 80.0–100.0)
Platelets: 185 K/uL (ref 150–400)
RBC: 4.44 MIL/uL (ref 3.87–5.11)
RDW: 14.4 % (ref 11.5–15.5)
WBC: 6.4 K/uL (ref 4.0–10.5)
nRBC: 0 % (ref 0.0–0.2)

## 2024-07-27 LAB — BASIC METABOLIC PANEL WITH GFR
Anion gap: 13 (ref 5–15)
BUN: 15 mg/dL (ref 6–20)
CO2: 22 mmol/L (ref 22–32)
Calcium: 10.5 mg/dL — ABNORMAL HIGH (ref 8.9–10.3)
Chloride: 106 mmol/L (ref 98–111)
Creatinine, Ser: 0.77 mg/dL (ref 0.44–1.00)
GFR, Estimated: >60 mL/min (ref >60)
Glucose, Bld: 96 mg/dL (ref 70–99)
Potassium: 3.8 mmol/L (ref 3.5–5.1)
Sodium: 141 mmol/L (ref 135–145)

## 2024-07-27 LAB — TROPONIN T, HIGH SENSITIVITY: Troponin T High Sensitivity: <15 ng/L (ref 0–19)

## 2024-07-27 MED ORDER — IPRATROPIUM-ALBUTEROL 0.5-2.5 (3) MG/3ML IN SOLN
3.0000 mL | Freq: Once | RESPIRATORY_TRACT | Status: AC
  Administered 2024-07-27: 3 mL via RESPIRATORY_TRACT
  Filled 2024-07-27: qty 3

## 2024-07-27 MED ORDER — IPRATROPIUM-ALBUTEROL 0.5-2.5 (3) MG/3ML IN SOLN: 3.0000 mL | Freq: Four times a day (QID) | RESPIRATORY_TRACT | 3 refills | Status: AC | PRN

## 2024-07-27 MED ORDER — MAGNESIUM SULFATE 2 GM/50ML IV SOLN
2.0000 g | Freq: Once | INTRAVENOUS | Status: AC
  Administered 2024-07-27: 2 g via INTRAVENOUS
  Filled 2024-07-27: qty 50

## 2024-07-27 MED ORDER — PREDNISONE 50 MG PO TABS: 50.0000 mg | ORAL_TABLET | Freq: Every day | ORAL | 0 refills | Status: DC

## 2024-07-27 MED ORDER — ALBUTEROL SULFATE HFA 108 (90 BASE) MCG/ACT IN AERS: 2.0000 | INHALATION_SPRAY | Freq: Four times a day (QID) | RESPIRATORY_TRACT | 3 refills | Status: AC | PRN

## 2024-07-27 MED ORDER — METHYLPREDNISOLONE SODIUM SUCC 125 MG IJ SOLR
125.0000 mg | Freq: Once | INTRAMUSCULAR | Status: AC
  Administered 2024-07-27: 125 mg via INTRAVENOUS
  Filled 2024-07-27: qty 2

## 2024-07-27 NOTE — Telephone Encounter (Unsigned)
 Copied from CRM #[id number]. Topic: Clinical - Medical Advice >> Jul 27, 2024  9:13 AM Lonell PEDLAR wrote: Reason for CRM: Patient with worsening symptoms for COPD, shortness of breath. Her son, Evalene, is taking her to [facility] ER. Unsure if a provider is able to see her there or I she should keep the apt for tomorrow 12/3. C/B: [phone] >> Jul 27, 2024  9:17 AM Lonell PEDLAR wrote: Evalene also called yesterday regarding rx refill for pts albuterol . Patient is completely out of medication and is opting to use breathing machine through out the day.

## 2024-07-27 NOTE — ED Triage Notes (Signed)
 Pt c/o SOB worsening since Friday. Pt has COPD but does not wear oxygen. Pt has been using nebulizers and her inhaler without relief. Pt has appointment with PCP tomorrow. Pt with pursed lip breathing in triage.

## 2024-07-27 NOTE — Addendum Note (Signed)
 Addended by: TERREL POWELL CROME on: 07/27/2024 10:14 AM   Modules accepted: Orders

## 2024-07-28 ENCOUNTER — Ambulatory Visit: Admitting: Physician Assistant

## 2024-07-28 ENCOUNTER — Encounter: Payer: Self-pay | Admitting: Physician Assistant

## 2024-07-28 VITALS — BP 174/86 | HR 113 | Ht 60.0 in | Wt 186.2 lb

## 2024-07-28 DIAGNOSIS — I10 Essential (primary) hypertension: Secondary | ICD-10-CM | POA: Diagnosis not present

## 2024-07-28 DIAGNOSIS — R0789 Other chest pain: Secondary | ICD-10-CM | POA: Diagnosis not present

## 2024-07-28 DIAGNOSIS — J449 Chronic obstructive pulmonary disease, unspecified: Secondary | ICD-10-CM

## 2024-07-28 MED ORDER — BREZTRI AEROSPHERE 160-9-4.8 MCG/ACT IN AERO: 2.0000 | INHALATION_SPRAY | Freq: Two times a day (BID) | RESPIRATORY_TRACT | 0 refills | Status: DC

## 2024-07-28 MED ORDER — PREDNISONE 20 MG PO TABS: 20.0000 mg | ORAL_TABLET | Freq: Every day | ORAL | 0 refills | Status: DC

## 2024-07-28 MED ORDER — AZITHROMYCIN 250 MG PO TABS: ORAL_TABLET | ORAL | 0 refills | Status: AC

## 2024-07-28 NOTE — Progress Notes (Signed)
 Established patient visit  Patient: Susan Gilmore   DOB: [birth year]   58 y.o. Female  MRN: [id number] Visit Date: 07/28/2024  Today's healthcare provider: Jolynn Spencer, PA-C   Chief Complaint  Patient presents with   COPD    Worsening copd , seen at ER 07/27/24 refilled albuterol     Subjective     HPI     COPD    Additional comments: Worsening copd , seen at ER 07/27/24 refilled albuterol        Last edited by Wilfred Hargis RAMAN, CMA on 07/28/2024  4:06 PM.       Discussed the use of AI scribe software for clinical note transcription with the patient, who gave verbal consent to proceed.  History of Present Illness Susan Gilmore is a 58 year old female with COPD who presents with difficulty breathing. She is accompanied by her daughter-in-law.  She has had increased shortness of breath with episodes of hyperventilation and describes feeling like she can't breathe. She is using albuterol  every 2 to 3 hours, more often than prescribed, and went to the emergency room yesterday for these symptoms.  Her ribs are painful from coughing. She denies chest pain and confirms shortness of breath. She has had similar episodes in the past that were worse than this one.  She previously used a nebulized medication, including Breztri , which improved her breathing, but she has been unable to obtain it recently.  She is unsure about fever.        08/04/2023   10:38 AM 06/11/2023    1:47 PM 02/05/2022    8:30 AM  Depression screen PHQ 2/9  Decreased Interest 3 3 0  Down, Depressed, Hopeless 3 2 0  PHQ - 2 Score 6 5 0  Altered sleeping 3 0 2  Tired, decreased energy 3 3 2   Change in appetite 2 0 1  Feeling bad or failure about yourself  3 0 0  Trouble concentrating 2 0 1  Moving slowly or fidgety/restless 0 3 0  Suicidal thoughts 0 0 0  PHQ-9 Score 19  11  6    Difficult doing work/chores Not difficult at all Very difficult Somewhat difficult     Data saved with a previous  flowsheet row definition      08/04/2023   10:39 AM 06/11/2023    1:50 PM  GAD 7 : Generalized Anxiety Score  Nervous, Anxious, on Edge 2 0  Control/stop worrying 2 0  Worry too much - different things 2 0  Trouble relaxing 2 0  Restless 2 0  Easily annoyed or irritable 1 3  Afraid - awful might happen 2 0  Total GAD 7 Score 13 3  Anxiety Difficulty Not difficult at all Not difficult at all    Medications: Outpatient Medications Prior to Visit  Medication Sig   albuterol  (VENTOLIN  HFA) 108 (90 Base) MCG/ACT inhaler Inhale 2 puffs into the lungs every 6 (six) hours as needed for wheezing or shortness of breath.   amLODipine  (NORVASC ) 10 MG tablet Take 1 tablet (10 mg total) by mouth daily.   aspirin  EC 81 MG tablet Take 1 tablet (81 mg total) by mouth daily.   atorvastatin  (LIPITOR) 40 MG tablet TAKE 1 TABLET BY MOUTH DAILY   baclofen  (LIORESAL ) 10 MG tablet Take 1 tablet (10 mg total) by mouth daily as needed for muscle spasms.   benzonatate  (TESSALON  PERLES) 100 MG capsule Take 2 capsules (200 mg total) by mouth 3 (three) times daily as  needed for cough.   Budeson-Glycopyrrol-Formoterol  (BREZTRI  AEROSPHERE) 160-9-4.8 MCG/ACT AERO Inhale 2 puffs into the lungs 2 (two) times daily.   DULoxetine  (CYMBALTA ) 60 MG capsule Take 120 mg by mouth daily.    fluocinonide cream (LIDEX) 0.05 %    fluticasone  (FLONASE ) 50 MCG/ACT nasal spray Place 2 sprays into both nostrils daily. (Patient taking differently: Place 2 sprays into both nostrils 2 (two) times daily.)   FLUZONE QUADRIVALENT 0.5 ML injection    HUMIRA PEN 40 MG/0.4ML PNKT    hydrochlorothiazide  (MICROZIDE ) 12.5 MG capsule TAKE 1 CAPSULE(12.5 MG) BY MOUTH DAILY   ibuprofen  (ADVIL ) 800 MG tablet TAKE 1 TABLET(800 MG) BY MOUTH EVERY 8 HOURS AS NEEDED   ipratropium-albuterol  (DUONEB) 0.5-2.5 (3) MG/3ML SOLN Take 3 mLs by nebulization every 6 (six) hours as needed.   leflunomide  (ARAVA ) 20 MG tablet TAKE 1 TABLET(20 MG) BY MOUTH DAILY    Levonorgestrel-Ethinyl Estradiol (AMETHIA) 0.15-0.03 &0.01 MG tablet Take 1 tablet by mouth at bedtime.   loratadine  (CLARITIN ) 10 MG tablet Take 1 tablet by mouth daily as needed for allergies.    losartan  (COZAAR ) 100 MG tablet Take 1 tablet (100 mg total) by mouth daily.   montelukast  (SINGULAIR ) 10 MG tablet TAKE 1 TABLET(10 MG) BY MOUTH AT BEDTIME   predniSONE  (DELTASONE ) 50 MG tablet Take 1 tablet (50 mg total) by mouth daily with breakfast.   spironolactone (ALDACTONE) 25 MG tablet Take 25 mg by mouth daily.   zolpidem  (AMBIEN ) 10 MG tablet Take 10 mg by mouth at bedtime.    No facility-administered medications prior to visit.    Review of Systems All negative Except see HPI       Objective    BP (!) 174/86   Pulse (!) 113   Ht 5' (1.524 m)   Wt 186 lb 3.2 oz (84.5 kg)   LMP  (LMP Unknown)   SpO2 95%   BMI 36.36 kg/m     Physical Exam Vitals reviewed.  Constitutional:      General: She is not in acute distress.    Appearance: Normal appearance. She is well-developed. She is not diaphoretic.  HENT:     Head: Normocephalic and atraumatic.  Eyes:     General: No scleral icterus.    Conjunctiva/sclera: Conjunctivae normal.  Neck:     Thyroid : No thyromegaly.  Cardiovascular:     Rate and Rhythm: Normal rate and regular rhythm.     Pulses: Normal pulses.     Heart sounds: Normal heart sounds. No murmur heard. Pulmonary:     Effort: Pulmonary effort is normal. No respiratory distress.     Breath sounds: Normal breath sounds. No wheezing, rhonchi or rales.  Musculoskeletal:     Cervical back: Neck supple.     Right lower leg: No edema.     Left lower leg: No edema.  Lymphadenopathy:     Cervical: No cervical adenopathy.  Skin:    General: Skin is warm and dry.     Findings: No rash.  Neurological:     Mental Status: She is alert and oriented to person, place, and time. Mental status is at baseline.  Psychiatric:        Mood and Affect: Mood normal.         Behavior: Behavior is uncooperative and agitated.      No results found for any visits on 07/28/24.      Assessment & Plan Chronic obstructive pulmonary disease (COPD) exacerbation Acute COPD exacerbation with severe  dyspnea and hyperventilation. Breztri  effective previously but unavailable. Albuterol  used frequently without relief. Bacterial infection considered but unlikely. - Prescribed Breztri , potential cost issues noted. - Prescribed prednisone  20 mg twice daily for inflammation. - Continue albuterol  every 4-6 hours as needed. - Referred to pulmonologist for further evaluation and management. - Advised to return if symptoms worsen.  Rib pain secondary to coughing Rib pain due to persistent coughing from COPD exacerbation. - Recommended Tylenol  or ibuprofen  for pain management as needed.   Copd exacerbation  - predniSONE  (DELTASONE ) 20 MG tablet; Take 1 tablet (20 mg total) by mouth daily with breakfast.  Dispense: 10 tablet; Refill: 0 - azithromycin  (ZITHROMAX ) 250 MG tablet; Take 2 tablets on day 1, then 1 tablet daily on days 2 through 5  Dispense: 6 tablet; Refill: 0 - budesonide -glycopyrrolate-formoterol  (BREZTRI  AEROSPHERE) 160-9-4.8 MCG/ACT AERO inhaler; Inhale 2 puffs into the lungs 2 (two) times daily.  Dispense: 10.7 g; Refill: 0  HTN Chronic and unstable Most likely due to pain or COPD exacerbation Advised to continue current regimen Will follow-up with his primary  No orders of the defined types were placed in this encounter.   No follow-ups on file.   The patient was advised to call back or seek an in-person evaluation if the symptoms worsen or if the condition fails to improve as anticipated.  I discussed the assessment and treatment plan with the patient. The patient was provided an opportunity to ask questions and all were answered. The patient agreed with the plan and demonstrated an understanding of the instructions.  I, Pegi Milazzo, PA-C have  reviewed all documentation for this visit. The documentation on 07/28/2024  for the exam, diagnosis, procedures, and orders are all accurate and complete.  Jolynn Spencer, [facility], MMS [facility] [phone] (phone) [phone] (fax)  [facility]

## 2024-07-30 ENCOUNTER — Other Ambulatory Visit: Payer: Self-pay

## 2024-07-30 ENCOUNTER — Observation Stay: Admission: EM | Admit: 2024-07-30 | Discharge: 2024-08-01 | Disposition: A

## 2024-07-30 ENCOUNTER — Emergency Department

## 2024-07-30 DIAGNOSIS — J9601 Acute respiratory failure with hypoxia: Secondary | ICD-10-CM | POA: Diagnosis not present

## 2024-07-30 DIAGNOSIS — G4733 Obstructive sleep apnea (adult) (pediatric): Secondary | ICD-10-CM

## 2024-07-30 DIAGNOSIS — R079 Chest pain, unspecified: Secondary | ICD-10-CM | POA: Diagnosis not present

## 2024-07-30 DIAGNOSIS — Z8673 Personal history of transient ischemic attack (TIA), and cerebral infarction without residual deficits: Secondary | ICD-10-CM

## 2024-07-30 DIAGNOSIS — R0602 Shortness of breath: Secondary | ICD-10-CM | POA: Diagnosis not present

## 2024-07-30 DIAGNOSIS — I10 Essential (primary) hypertension: Secondary | ICD-10-CM | POA: Diagnosis present

## 2024-07-30 DIAGNOSIS — R0902 Hypoxemia: Secondary | ICD-10-CM | POA: Diagnosis not present

## 2024-07-30 DIAGNOSIS — J441 Chronic obstructive pulmonary disease with (acute) exacerbation: Secondary | ICD-10-CM | POA: Diagnosis not present

## 2024-07-30 DIAGNOSIS — M069 Rheumatoid arthritis, unspecified: Secondary | ICD-10-CM | POA: Diagnosis present

## 2024-07-30 DIAGNOSIS — I5020 Unspecified systolic (congestive) heart failure: Secondary | ICD-10-CM | POA: Diagnosis present

## 2024-07-30 DIAGNOSIS — J449 Chronic obstructive pulmonary disease, unspecified: Secondary | ICD-10-CM | POA: Diagnosis present

## 2024-07-30 LAB — TROPONIN T, HIGH SENSITIVITY
Troponin T High Sensitivity: <15 ng/L (ref 0–19)
Troponin T High Sensitivity: <15 ng/L (ref 0–19)

## 2024-07-30 LAB — BASIC METABOLIC PANEL WITH GFR
Anion gap: 11 (ref 5–15)
BUN: 31 mg/dL — ABNORMAL HIGH (ref 6–20)
CO2: 23 mmol/L (ref 22–32)
Calcium: 9.2 mg/dL (ref 8.9–10.3)
Chloride: 106 mmol/L (ref 98–111)
Creatinine, Ser: 0.92 mg/dL (ref 0.44–1.00)
GFR, Estimated: >60 mL/min (ref >60)
Glucose, Bld: 122 mg/dL — ABNORMAL HIGH (ref 70–99)
Potassium: 4.1 mmol/L (ref 3.5–5.1)
Sodium: 140 mmol/L (ref 135–145)

## 2024-07-30 LAB — CBC
HCT: 40.1 % (ref 36.0–46.0)
Hemoglobin: 13.4 g/dL (ref 12.0–15.0)
MCH: 29.1 pg (ref 26.0–34.0)
MCHC: 33.4 g/dL (ref 30.0–36.0)
MCV: 87.2 fL (ref 80.0–100.0)
Platelets: 213 K/uL (ref 150–400)
RBC: 4.6 MIL/uL (ref 3.87–5.11)
RDW: 14.7 % (ref 11.5–15.5)
WBC: 11.1 K/uL — ABNORMAL HIGH (ref 4.0–10.5)
nRBC: 0 % (ref 0.0–0.2)

## 2024-07-30 LAB — RESP PANEL BY RT-PCR (RSV, FLU A&B, COVID)  RVPGX2
Influenza A by PCR: NEGATIVE
Influenza B by PCR: NEGATIVE
Resp Syncytial Virus by PCR: NEGATIVE
SARS Coronavirus 2 by RT PCR: NEGATIVE

## 2024-07-30 LAB — MAGNESIUM
Magnesium: 2.2 mg/dL (ref 1.7–2.4)
Magnesium: 2.2 mg/dL (ref 1.7–2.4)

## 2024-07-30 LAB — BLOOD GAS, VENOUS
Acid-base deficit: 1 mmol/L (ref 0.0–2.0)
Bicarbonate: 22.2 mmol/L (ref 20.0–28.0)
O2 Saturation: 62.5 %
Patient temperature: 37
pCO2, Ven: 32 mmHg — ABNORMAL LOW (ref 44–60)
pH, Ven: 7.45 — ABNORMAL HIGH (ref 7.25–7.43)
pO2, Ven: 39 mmHg (ref 32–45)

## 2024-07-30 LAB — PRO BRAIN NATRIURETIC PEPTIDE: Pro Brain Natriuretic Peptide: 519 pg/mL — ABNORMAL HIGH (ref <300.0)

## 2024-07-30 MED ORDER — IPRATROPIUM-ALBUTEROL 0.5-2.5 (3) MG/3ML IN SOLN
3.0000 mL | Freq: Once | RESPIRATORY_TRACT | Status: AC
  Administered 2024-07-30: 3 mL via RESPIRATORY_TRACT
  Filled 2024-07-30: qty 3

## 2024-07-30 MED ORDER — PREDNISONE 20 MG PO TABS
40.0000 mg | ORAL_TABLET | Freq: Every day | ORAL | Status: DC
  Administered 2024-07-31 – 2024-08-01 (×2): 40 mg via ORAL
  Filled 2024-07-30 (×2): qty 2

## 2024-07-30 MED ORDER — SENNOSIDES-DOCUSATE SODIUM 8.6-50 MG PO TABS: 1.0000 | ORAL_TABLET | Freq: Every evening | ORAL | Status: DC | PRN

## 2024-07-30 MED ORDER — ACETAMINOPHEN 325 MG PO TABS
650.0000 mg | ORAL_TABLET | Freq: Four times a day (QID) | ORAL | Status: DC | PRN
  Administered 2024-07-31 – 2024-08-01 (×2): 650 mg via ORAL
  Filled 2024-07-30 (×2): qty 2

## 2024-07-30 MED ORDER — ONDANSETRON HCL 4 MG PO TABS
4.0000 mg | ORAL_TABLET | Freq: Three times a day (TID) | ORAL | Status: DC | PRN
  Administered 2024-07-31: 4 mg via ORAL
  Filled 2024-07-30: qty 1

## 2024-07-30 MED ORDER — AZITHROMYCIN 250 MG PO TABS
250.0000 mg | ORAL_TABLET | Freq: Every day | ORAL | Status: DC
  Administered 2024-07-31 – 2024-08-01 (×2): 250 mg via ORAL
  Filled 2024-07-30 (×2): qty 1

## 2024-07-30 MED ORDER — METHYLPREDNISOLONE SODIUM SUCC 125 MG IJ SOLR
125.0000 mg | Freq: Once | INTRAMUSCULAR | Status: AC
  Administered 2024-07-30: 125 mg via INTRAVENOUS
  Filled 2024-07-30: qty 2

## 2024-07-30 MED ORDER — ACETAMINOPHEN 650 MG RE SUPP: 650.0000 mg | Freq: Four times a day (QID) | RECTAL | Status: DC | PRN

## 2024-07-30 MED ORDER — ENOXAPARIN SODIUM 40 MG/0.4ML IJ SOSY
40.0000 mg | PREFILLED_SYRINGE | INTRAMUSCULAR | Status: DC
  Administered 2024-07-30 – 2024-07-31 (×2): 40 mg via SUBCUTANEOUS
  Filled 2024-07-30 (×2): qty 0.4

## 2024-07-30 MED ORDER — SODIUM CHLORIDE 0.9% FLUSH
3.0000 mL | Freq: Two times a day (BID) | INTRAVENOUS | Status: DC
  Administered 2024-07-30 – 2024-07-31 (×2): 3 mL via INTRAVENOUS
  Administered 2024-07-31 – 2024-08-01 (×2): 10 mL via INTRAVENOUS

## 2024-07-30 NOTE — ED Triage Notes (Signed)
 Pt comes in from home via pov with complaints of SOB and chest pain. Pt was recently seen on Monday for the same thing, Pt has been dealing with the SOB since she was discharged. Pt complains of pain 7/10 at this time. Pt had an albuterol  before arriving today with no relief.

## 2024-07-30 NOTE — ED Provider Notes (Signed)
 [facility] Provider Note    Event Date/Time   First MD Initiated Contact with Patient 07/30/24 1635     (approximate)   History   Shortness of Breath   HPI  Susan Gilmore is a 58 y.o. female  PMH OSA on CPAP, COPD, hypertension, anxiety, acid reflux, seizure disorder, HFrEF presents to the emergency department with 1 week of progressively worsening shortness of breath cough and general malaise.  Patient was seen in our emergency department earlier this week and diagnosed with a COPD exacerbation.  She went and saw her primary care physician the next day and has been on azithromycin , and albuterol  inhaler and prednisone .  Her symptoms have worsened and she cannot ambulate without extreme shortness of breath despite taking her medications as directed.  Denies any chest pain abdominal pain nausea or vomiting.  Denies any fevers.  Her son is at bedside who helps contribute to the history      Physical Exam   Triage Vital Signs: ED Triage Vitals  Encounter Vitals Group     BP 07/30/24 1619 (!) 150/86     Girls Systolic BP Percentile --      Girls Diastolic BP Percentile --      Boys Systolic BP Percentile --      Boys Diastolic BP Percentile --      Pulse Rate 07/30/24 1619 78     Resp 07/30/24 1619 (!) 23     Temp 07/30/24 1619 98.3 F (36.8 C)     Temp src --      SpO2 07/30/24 1619 96 %     Weight 07/30/24 1615 186 lb 4.6 oz (84.5 kg)     Height 07/30/24 1615 5' (1.524 m)     Head Circumference --      Peak Flow --      Pain Score 07/30/24 1615 7     Pain Loc --      Pain Education --      Exclude from Growth Chart --     Most recent vital signs: Vitals:   07/30/24 2143 07/30/24 2339  BP: (!) 167/84 (!) 157/76  Pulse: 81 76  Resp: 18 20  Temp: 98.7 F (37.1 C) 98.3 F (36.8 C)  SpO2: 98% 96%    Nursing Triage Note reviewed. Vital signs reviewed and patients oxygen saturation is hypoxic  General: Patient is well nourished, well  developed, awake and alert, appears uncomfortable Head: Normocephalic and atraumatic Eyes: Normal inspection, extraocular muscles intact, no conjunctival pallor Ear, nose, throat: Normal external exam Neck: Normal range of motion Respiratory: Patient is in moderate respiratory distress, lungs with wheezes throughout not moving good air Cardiovascular: Patient is not tachycardic, RRR without murmur appreciated GI: Abd SNT with no guarding or rebound  Back: Normal inspection of the back with good strength and range of motion throughout all ext Extremities: pulses intact with good cap refills, no LE pitting edema or calf tenderness Neuro: The patient is alert and oriented to person, place, and time, appropriately conversive, with 5/5 bilat UE/LE strength, no gross motor or sensory defects noted. Coordination appears to be adequate. Skin: Warm, dry, and intact Psych: normal mood and affect, no SI or HI  ED Results / Procedures / Treatments   Labs (all labs ordered are listed, but only abnormal results are displayed) Labs Reviewed  CBC - Abnormal; Notable for the following components:      Result Value   WBC 11.1 (*)  All other components within normal limits  BLOOD GAS, VENOUS - Abnormal; Notable for the following components:   pH, Ven 7.45 (*)    pCO2, Ven 32 (*)    All other components within normal limits  BASIC METABOLIC PANEL WITH GFR - Abnormal; Notable for the following components:   Glucose, Bld 122 (*)    BUN 31 (*)    All other components within normal limits  PRO BRAIN NATRIURETIC PEPTIDE - Abnormal; Notable for the following components:   Pro Brain Natriuretic Peptide 519.0 (*)    All other components within normal limits  RESP PANEL BY RT-PCR (RSV, FLU A&B, COVID)  RVPGX2  MAGNESIUM   MAGNESIUM   HIV ANTIBODY (ROUTINE TESTING W REFLEX)  BASIC METABOLIC PANEL WITH GFR  CBC  TROPONIN T, HIGH SENSITIVITY  TROPONIN T, HIGH SENSITIVITY     EKG EKG and rhythm strip  are interpreted by myself:   EKG: [Normal sinus rhythm] at heart rate of 88, normal QRS duration, QTc 484, nonspecific ST segments and T waves no ectopy EKG not consistent with Acute STEMI Rhythm strip: NSR in lead II Patient has deep T wave inversions in the lateral leads however this is unchanged from prior EKG on 07/27/2024   RADIOLOGY CXR: No acute abnormality on my independent review interpretation radiologist agrees    PROCEDURES:  Critical Care performed: No  Procedures   MEDICATIONS ORDERED IN ED: Medications  sodium chloride  flush (NS) 0.9 % injection 3 mL (3 mLs Intravenous Given 07/30/24 2151)  acetaminophen  (TYLENOL ) tablet 650 mg (has no administration in time range)    Or  acetaminophen  (TYLENOL ) suppository 650 mg (has no administration in time range)  senna-docusate (Senokot-S) tablet 1 tablet (has no administration in time range)  enoxaparin  (LOVENOX ) injection 40 mg (40 mg Subcutaneous Given 07/30/24 2151)  predniSONE  (DELTASONE ) tablet 40 mg (has no administration in time range)  azithromycin  (ZITHROMAX ) tablet 250 mg (has no administration in time range)  ondansetron  (ZOFRAN ) tablet 4 mg (has no administration in time range)  ipratropium-albuterol  (DUONEB) 0.5-2.5 (3) MG/3ML nebulizer solution 3 mL (3 mLs Nebulization Given 07/30/24 1723)  ipratropium-albuterol  (DUONEB) 0.5-2.5 (3) MG/3ML nebulizer solution 3 mL (3 mLs Nebulization Given 07/30/24 1723)  methylPREDNISolone  sodium succinate (SOLU-MEDROL ) 125 mg/2 mL injection 125 mg (125 mg Intravenous Given 07/30/24 1723)     IMPRESSION / MDM / ASSESSMENT AND PLAN / ED COURSE                                Differential diagnosis includes, but is not limited to, COPD exacerbation, pneumonia, electrolyte derangement, anemia, CHF exacerbation   ED course: Patient arrives and pulmonary exam is consistent with bronchospasm.  Initial EKG is abnormal however it is stable from prior EKGs and symptoms does not seem  consistent with atypical ACS.  A troponin was not elevated.  She had no acidosis but has been required some oxygen.  She had no profound leukocytosis with a WBC of 11.1 which may be secondary to the prednisone  she has been on and no profound electrolyte derangements.  Chest x-ray was unremarkable.  She was given 2 DuoNebs and 125 mg of Solu-Medrol  with some improvement in symptoms.  Given that she has been on outpatient treatment and her symptoms have been worsening, case discussed with the hospitalist today for admission and continued DuoNebs and pulmonary toileting   Clinical Course as of 07/31/24 0039  Fri Jul 30, 2024  1748 Pro Brain Natriuretic Peptide(!): 519.0 Not significantly elevated [HD]  1753 WBC(!): 11.1 Could be secondary to leukocytosis [HD]  1836 Case discussed with hospitalist for admission [HD]    Clinical Course User Index [HD] Nicholaus Rolland BRAVO, MD   -- Risk: 5 This patient has a high risk of morbidity due to further diagnostic testing or treatment. Rationale: This patient's evaluation and management involve a high risk of morbidity due to the potential severity of presenting symptoms, need for diagnostic testing, and/or initiation of treatment that may require close monitoring. The differential includes conditions with potential for significant deterioration or requiring escalation of care. Treatment decisions in the ED, including medication administration, procedural interventions, or disposition planning, reflect this level of risk. COPA: 5 The patient has the following acute or chronic illness/injury that poses a possible threat to life or bodily function: [X] : The patient has a potentially serious acute condition or an acute exacerbation of a chronic illness requiring urgent evaluation and management in the Emergency Department. The clinical presentation necessitates immediate consideration of life-threatening or function-threatening diagnoses, even if they are ultimately  ruled out.   FINAL CLINICAL IMPRESSION(S) / ED DIAGNOSES   Final diagnoses:  COPD exacerbation (HCC)  Hypoxia     Rx / DC Orders   ED Discharge Orders     None        Note:  This document was prepared using Dragon voice recognition software and may include unintentional dictation errors.   Nicholaus Rolland BRAVO, MD 07/31/24 [phone]

## 2024-07-30 NOTE — H&P (Addendum)
 # History and Physical    [name] FMW:[id number] DOB: 08/22/1966 DOA: 07/30/2024  DOS: the patient was seen and examined on 07/30/2024  PCP: Susan Lauraine SAILOR, DO   Patient coming from: Home  I have personally briefly reviewed patient's old medical records in [facility] and CareEverywhere  HPI:   Susan Gilmore is a 58 y.o. year old female with medical history of hypertension, hyperlipidemia, CHF, rheumatoid arthritis, COPD, OSA on CPAP presenting to the ED after having 1 week of worsening shortness of breath and cough.   Pt states she has been having shortness of breath and coughing for 1 week.  She denies any sick contacts.  She states now when she is having coughing she is getting some chest pain with the cough.  She denies any fevers or chills or other URI symptoms.  She states she has been using her inhaler around-the-clock which she had with her.  She showed me on her inhaler which were multiple albuterol  inhalers.  She states she has been out of the other inhaler for quite a while.  She recently presented to her primary care office but her primary care doctor was not present and she was seen by a PA.  She states given that he did not know her history, she decided to come to the ED as her breathing was not improving with old medication.  On chart review, patient given prednisone  and azithromycin  for COPD exacerbation.  It appears she has not picked up her Breztri  since May 2025.   On arrival to the ED patient was noted to be HDS stable.  CBC with mild leukocytosis otherwise unremarkable.  BMP with mild hyperglycemia slight BUN elevation but otherwise unremarkable.  Troponin normal.  proBNP mildly elevated.  Magnesium  within normal limits.  Chest x-ray performed and negative for any acute findings.  Respiratory panel negative for COVID, flu, RSV.  Patient without any baseline oxygen requirements was placed on 2 L and given this oxygen requirement and COPD exacerbation, [facility]  contacted for admission.  Review of Systems: As mentioned in the history of present illness. All other systems reviewed and are negative.   Past Medical History:  Diagnosis Date   Abnormal Pap smear 04/28/2012   normal pap /positive hpv    Abnormal stress test    Allergic rhinitis    Bacteremia due to Gram-positive bacteria 05/28/2023   Bipolar disorder (HCC)    Carpal tunnel syndrome    Chest pain    Chronic anxiety    Contraception    COPD (chronic obstructive pulmonary disease) (HCC)    Coronary artery disease    Cough    HPV (human papilloma virus) anogenital infection    HTN (hypertension)    Hyperplastic colon polyp    Insomnia    Palpitations    Pneumonia    Stroke [facility])     Past Surgical History:  Procedure Laterality Date   CHOLECYSTECTOMY     full mouth dental     GANGLION CYST EXCISION     WISDOM TOOTH EXTRACTION       Allergies  Allergen Reactions   Effexor [Venlafaxine Hcl] Swelling   Isosorbide Nitrate Other (See Comments)    Reaction: Bad headache    Family History  Problem Relation Age of Onset   Stroke Paternal Grandmother    Heart disease Paternal Grandmother    Diabetes Maternal Grandmother    Heart disease Maternal Grandmother    Hypertension Maternal Grandmother  Heart disease Maternal Grandfather    Hypertension Maternal Grandfather    Heart disease Father    Stroke Father    Hypertension Father    Hyperlipidemia Father    Diabetes Mother    Heart attack Mother     Prior to Admission medications   Medication Sig Start Date End Date Taking? Authorizing Provider  albuterol  (VENTOLIN  HFA) 108 (90 Base) MCG/ACT inhaler Inhale 2 puffs into the lungs every 6 (six) hours as needed for wheezing or shortness of breath. 07/27/24   Susan Lauraine SAILOR, DO  amLODipine  (NORVASC ) 10 MG tablet Take 1 tablet (10 mg total) by mouth daily. 07/16/21   Lonni Slain, MD  aspirin  EC 81 MG tablet Take 1 tablet (81 mg total) by mouth daily.  10/23/18   Lonni Slain, MD  atorvastatin  (LIPITOR) 40 MG tablet TAKE 1 TABLET BY MOUTH DAILY 01/27/24   Susan Lauraine SAILOR, DO  azithromycin  (ZITHROMAX ) 250 MG tablet Take 2 tablets on day 1, then 1 tablet daily on days 2 through 5 07/28/24 08/02/24  Ostwalt, Janna, PA-C  baclofen  (LIORESAL ) 10 MG tablet Take 1 tablet (10 mg total) by mouth daily as needed for muscle spasms. 06/12/22   Cyndi Shaver, PA-C  benzonatate  (TESSALON  PERLES) 100 MG capsule Take 2 capsules (200 mg total) by mouth 3 (three) times daily as needed for cough. 10/08/23   Pender, Julie F, FNP  budesonide -glycopyrrolate-formoterol  (BREZTRI  AEROSPHERE) 160-9-4.8 MCG/ACT AERO inhaler Inhale 2 puffs into the lungs 2 (two) times daily. 07/28/24   Ostwalt, Janna, PA-C  DULoxetine  (CYMBALTA ) 60 MG capsule Take 120 mg by mouth daily.     [provider]  fluocinonide cream (LIDEX) 0.05 %  11/08/21   [provider]  fluticasone  (FLONASE ) 50 MCG/ACT nasal spray Place 2 sprays into both nostrils daily. Patient taking differently: Place 2 sprays into both nostrils 2 (two) times daily. 03/15/15   Vivienne Delon HERO, PA-C  FLUZONE QUADRIVALENT 0.5 ML injection  05/05/20   [provider]  HUMIRA PEN 40 MG/0.4ML PNKT  12/03/21   [provider]  hydrochlorothiazide  (MICROZIDE ) 12.5 MG capsule TAKE 1 CAPSULE(12.5 MG) BY MOUTH DAILY 12/06/22   Drubel, Shaver, PA-C  ibuprofen  (ADVIL ) 800 MG tablet TAKE 1 TABLET(800 MG) BY MOUTH EVERY 8 HOURS AS NEEDED 08/29/20   Lane Shope, MD  ipratropium-albuterol  (DUONEB) 0.5-2.5 (3) MG/3ML SOLN Take 3 mLs by nebulization every 6 (six) hours as needed. 07/27/24   Susan Lauraine SAILOR, DO  leflunomide  (ARAVA ) 20 MG tablet TAKE 1 TABLET(20 MG) BY MOUTH DAILY 07/06/21   Cyndi Shaver, PA-C  Levonorgestrel-Ethinyl Estradiol (AMETHIA) 0.15-0.03 &0.01 MG tablet Take 1 tablet by mouth at bedtime. 06/05/22 07/28/24  Janit Alm Agent, MD  loratadine  (CLARITIN ) 10 MG tablet Take 1  tablet by mouth daily as needed for allergies.  12/19/14   [provider]  losartan  (COZAAR ) 100 MG tablet Take 1 tablet (100 mg total) by mouth daily. 09/24/23   Susan Lauraine SAILOR, DO  montelukast  (SINGULAIR ) 10 MG tablet TAKE 1 TABLET(10 MG) BY MOUTH AT BEDTIME 01/09/22   Drubel, Shaver, PA-C  predniSONE  (DELTASONE ) 20 MG tablet Take 1 tablet (20 mg total) by mouth daily with breakfast. 07/28/24   Ostwalt, Janna, PA-C  spironolactone (ALDACTONE) 25 MG tablet Take 25 mg by mouth daily.    [provider]  zolpidem  (AMBIEN ) 10 MG tablet Take 10 mg by mouth at bedtime.  02/01/18   [provider]    Social History:  reports that she  quit smoking about 11 years ago. Her smoking use included cigarettes. She started smoking about 37 years ago. She has a 26 pack-year smoking history. She has never used smokeless tobacco. She reports that she does not drink alcohol and does not use drugs. Lives with son Tobacco-significant history but denies current use. EtOH- Denies use.  Illicit drug use- denies use.  IADLs/ADLs- can perform independently at baseline    Physical Exam: Vitals:   07/30/24 1730 07/30/24 1800 07/30/24 1830 07/30/24 1900  BP: (!) 135/93 (!) 144/94 (!) 146/75 (!) 140/112  Pulse: 74 75 67 74  Resp:    15  Temp:      SpO2:      Weight:      Height:         Physical Exam General: NAD HENT: NCAT Lungs: Diffuse wheezing Cardiovascular: Normal heart sounds, no r/m/g, 2+ pulses in all extremities. No LE edema Abdomen: No TTP, normal bowel sounds MSK: No asymmetry or muscle atrophy.  Skin: no lesions noted on exposed skin Neuro: Alert and oriented x4. CN grossly intact Psych: Normal mood and normal affect    Labs on Admission: I have personally reviewed following labs and imaging studies  CBC: Recent Labs  Lab 07/27/24 1005 07/30/24 1709  WBC 6.4 11.1*  HGB 12.9 13.4  HCT 38.0 40.1  MCV 85.6 87.2  PLT 185 213   Basic Metabolic Panel: Recent  Labs  Lab 07/27/24 1005 07/30/24 1631  NA 141 140  K 3.8 4.1  CL 106 106  CO2 22 23  GLUCOSE 96 122*  BUN 15 31*  CREATININE 0.77 0.92  CALCIUM  10.5* 9.2  MG  --  2.2   GFR: Estimated Creatinine Clearance: 65.9 mL/min (by C-G formula based on SCr of 0.92 mg/dL). Liver Function Tests: No results for input(s): AST, ALT, ALKPHOS, BILITOT, PROT, ALBUMIN in the last 168 hours. No results for input(s): LIPASE, AMYLASE in the last 168 hours. No results for input(s): AMMONIA in the last 168 hours. Coagulation Profile: No results for input(s): INR, PROTIME in the last 168 hours. Cardiac Enzymes: No results for input(s): CKTOTAL, CKMB, CKMBINDEX, TROPONINI, TROPONINIHS in the last 168 hours. BNP (last 3 results) Recent Labs    05/30/24 1805  BNP 44.2   HbA1C: No results for input(s): HGBA1C in the last 72 hours. CBG: No results for input(s): GLUCAP in the last 168 hours. Lipid Profile: No results for input(s): CHOL, HDL, LDLCALC, TRIG, CHOLHDL, LDLDIRECT in the last 72 hours. Thyroid  Function Tests: No results for input(s): TSH, T4TOTAL, FREET4, T3FREE, THYROIDAB in the last 72 hours. Anemia Panel: No results for input(s): VITAMINB12, FOLATE, FERRITIN, TIBC, IRON, RETICCTPCT in the last 72 hours. Urine analysis:    Component Value Date/Time   COLORURINE YELLOW (A) 07/15/2020 1053   APPEARANCEUR HAZY (A) 07/15/2020 1053   LABSPEC 1.004 (L) 07/15/2020 1053   PHURINE 7.0 07/15/2020 1053   GLUCOSEU NEGATIVE 07/15/2020 1053   HGBUR NEGATIVE 07/15/2020 1053   BILIRUBINUR negative 07/05/2021 1053   KETONESUR NEGATIVE 07/15/2020 1053   PROTEINUR Positive (A) 07/05/2021 1053   PROTEINUR NEGATIVE 07/15/2020 1053   UROBILINOGEN 0.2 07/05/2021 1053   NITRITE negative 07/05/2021 1053   NITRITE NEGATIVE 07/15/2020 1053   LEUKOCYTESUR Negative 07/05/2021 1053   LEUKOCYTESUR LARGE (A) 07/15/2020 1053     Radiological Exams on Admission: I have personally reviewed images DG Chest 2 View Result Date: 07/30/2024 CLINICAL DATA:  Short of breath, chest pain EXAM: CHEST - 2 VIEW COMPARISON:  07/27/2024 FINDINGS: The heart size and mediastinal contours are within normal limits. Both lungs are clear. The visualized skeletal structures are unremarkable. IMPRESSION: No active cardiopulmonary disease. Electronically Signed   By: Ozell Daring M.D.   On: 07/30/2024 16:45    EKG: My personal interpretation of EKG shows: Sinus without any acute ST changes    Assessment/Plan Principal Problem:   Acute hypoxic respiratory failure (HCC) Active Problems:   Primary hypertension   OSA on CPAP   Chronic obstructive pulmonary disease (HCC)   Rheumatoid arthritis involving multiple sites (HCC)   History of stroke   HFrEF (heart failure with reduced ejection fraction) (HCC)   Patient with acute hypoxic respiratory failure secondary to likely COPD exacerbation.  She is nonadherent to her daily inhaler.  Educated her on the importance of this.  Will give patient prednisone  azithromycin  along with DuoNebs.  Advised avoiding any triggers.  Will wean her nasal cannula down with goal of 88-92.  Chronic Problems: Restart home meds once med reconciliation is completed.  HTN: continue home meds HLD: continue home meds CHF: Continue home meds GERD: continue home PPI RA: On biologic therapy for this.  Continue outpatient  VTE prophylaxis:  Lovenox   Diet: HH Code Status:  Full Code Telemetry:  Admission status: Observation, Telemetry bed Patient is from: Home Anticipated d/c is to: Home Anticipated d/c is in: 1-2 days   Family Communication: Updated at bedside  Consults called: None   Severity of Illness: The appropriate patient status for this patient is OBSERVATION. Observation status is judged to be reasonable and necessary in order to provide the required intensity of service to ensure the  patient's safety. The patient's presenting symptoms, physical exam findings, and initial radiographic and laboratory data in the context of their medical condition is felt to place them at decreased risk for further clinical deterioration. Furthermore, it is anticipated that the patient will be medically stable for discharge from the hospital within 2 midnights of admission.    Morene Bathe, MD [facility] DEL. [facility]

## 2024-07-31 DIAGNOSIS — J9601 Acute respiratory failure with hypoxia: Secondary | ICD-10-CM | POA: Diagnosis not present

## 2024-07-31 LAB — CBC
HCT: 37.3 % (ref 36.0–46.0)
Hemoglobin: 12.7 g/dL (ref 12.0–15.0)
MCH: 29.3 pg (ref 26.0–34.0)
MCHC: 34 g/dL (ref 30.0–36.0)
MCV: 85.9 fL (ref 80.0–100.0)
Platelets: 195 K/uL (ref 150–400)
RBC: 4.34 MIL/uL (ref 3.87–5.11)
RDW: 14.3 % (ref 11.5–15.5)
WBC: 10.2 K/uL (ref 4.0–10.5)
nRBC: 0 % (ref 0.0–0.2)

## 2024-07-31 LAB — BASIC METABOLIC PANEL WITH GFR
Anion gap: 11 (ref 5–15)
BUN: 27 mg/dL — ABNORMAL HIGH (ref 6–20)
CO2: 20 mmol/L — ABNORMAL LOW (ref 22–32)
Calcium: 8.3 mg/dL — ABNORMAL LOW (ref 8.9–10.3)
Chloride: 109 mmol/L (ref 98–111)
Creatinine, Ser: 0.75 mg/dL (ref 0.44–1.00)
GFR, Estimated: >60 mL/min (ref >60)
Glucose, Bld: 121 mg/dL — ABNORMAL HIGH (ref 70–99)
Potassium: 4 mmol/L (ref 3.5–5.1)
Sodium: 140 mmol/L (ref 135–145)

## 2024-07-31 LAB — HIV ANTIBODY (ROUTINE TESTING W REFLEX): HIV Screen 4th Generation wRfx: NONREACTIVE

## 2024-07-31 LAB — GLUCOSE, CAPILLARY: Glucose-Capillary: 121 mg/dL — ABNORMAL HIGH (ref 70–99)

## 2024-07-31 MED ORDER — LACOSAMIDE 50 MG PO TABS
100.0000 mg | ORAL_TABLET | Freq: Two times a day (BID) | ORAL | Status: DC
  Administered 2024-08-01 (×2): 100 mg via ORAL
  Filled 2024-07-31 (×2): qty 2

## 2024-07-31 MED ORDER — BUDESON-GLYCOPYRROL-FORMOTEROL 160-9-4.8 MCG/ACT IN AERO
2.0000 | INHALATION_SPRAY | Freq: Two times a day (BID) | RESPIRATORY_TRACT | Status: DC
  Administered 2024-07-31 – 2024-08-01 (×3): 2 via RESPIRATORY_TRACT
  Filled 2024-07-31: qty 5.9

## 2024-07-31 MED ORDER — AMLODIPINE BESYLATE 10 MG PO TABS
10.0000 mg | ORAL_TABLET | Freq: Every day | ORAL | Status: DC
  Administered 2024-08-01: 10 mg via ORAL
  Filled 2024-07-31 (×2): qty 1

## 2024-07-31 MED ORDER — LOSARTAN POTASSIUM 50 MG PO TABS
100.0000 mg | ORAL_TABLET | Freq: Every day | ORAL | Status: DC
  Administered 2024-08-01: 100 mg via ORAL
  Filled 2024-07-31: qty 2

## 2024-07-31 MED ORDER — ATORVASTATIN CALCIUM 20 MG PO TABS
40.0000 mg | ORAL_TABLET | Freq: Every day | ORAL | Status: DC
  Administered 2024-08-01: 40 mg via ORAL
  Filled 2024-07-31 (×2): qty 2

## 2024-07-31 MED ORDER — ASPIRIN 81 MG PO TBEC
81.0000 mg | DELAYED_RELEASE_TABLET | Freq: Every day | ORAL | Status: DC
  Administered 2024-08-01: 81 mg via ORAL
  Filled 2024-07-31 (×2): qty 1

## 2024-07-31 MED ORDER — IPRATROPIUM-ALBUTEROL 0.5-2.5 (3) MG/3ML IN SOLN
3.0000 mL | RESPIRATORY_TRACT | Status: DC | PRN
  Administered 2024-07-31 – 2024-08-01 (×7): 3 mL via RESPIRATORY_TRACT
  Filled 2024-07-31 (×7): qty 3

## 2024-07-31 MED ORDER — LEFLUNOMIDE 10 MG PO TABS
20.0000 mg | ORAL_TABLET | Freq: Every day | ORAL | Status: DC
  Administered 2024-08-01: 20 mg via ORAL
  Filled 2024-07-31 (×2): qty 2

## 2024-07-31 MED ORDER — SPIRONOLACTONE 25 MG PO TABS
25.0000 mg | ORAL_TABLET | Freq: Every day | ORAL | Status: DC
  Administered 2024-08-01: 25 mg via ORAL
  Filled 2024-07-31: qty 1

## 2024-07-31 MED ORDER — CARVEDILOL 12.5 MG PO TABS
12.5000 mg | ORAL_TABLET | Freq: Two times a day (BID) | ORAL | Status: DC
  Administered 2024-08-01: 12.5 mg via ORAL
  Filled 2024-07-31: qty 1

## 2024-07-31 MED ORDER — HYDRALAZINE HCL 20 MG/ML IJ SOLN: 10.0000 mg | Freq: Four times a day (QID) | INTRAMUSCULAR | Status: DC | PRN

## 2024-07-31 NOTE — Progress Notes (Signed)
 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 She has reportedly been out of Breztri  for several months and more recently out of albuterol . Had several days of worsening nonproductive cough, worsening dyspnea, and wheezing. Used home DuoNebs x 40 times a day without improvement. She presented to the ED on 07/27/2024 where she was treated with DuoNebs, IV Solumedrol, IV Mg, and discharged with a prednisone  taper, but symptoms worsened again shortly after. She was seen by her PCP on 07/28/2024 and prescribed Zithromax , Prednisone , and Breztri  refill, which she reportedly took but did not have significant improvement in symptoms, which prompted her presentation to the ED again on 07/30/2024.   In the ED, she was afebrile with a temp of 98.3 F, HR 75, tachypneic to 23, SpO2 96% on 3L [place]. VBG showed pH 7.45, pCO2 32, bicarb 22. CBC showed mild leukocytosis to 11.1, otherwise unremarkable. BMP was unremarkable. ProBNP was mildly elevated to 519. hsTroponins < 15 x 2. 4-plex PCR negative. Mg 2.2. CXR showed no acute cardiopulmonary disease.   She was treated with IV Solumedrol 125 mg and DuoNebs x 2, with improvement in symptoms. She was admitted for COPD exacerbation.   Assessment and Plan:  Acute COPD exacerbation - Presented with several days of worsening cough, increased dyspnea, and tachypnea, not responsive to home albuterol  and DuoNebs, and without significant  improvement on 2-3 days of outpatient Azithromycin  and Prednisone .  - CXR unremarkable - 4-plex PCR unremarkable - Received IV Solumedrol in the ED - SpO2 100% on 3L [place] this morning, weaned down to RA, maintaining SpO2 96% - Goal SpO2 88-92% while on O2 supplementation - Continue Prednisone  40 mg daily - Continue PO Azrithromycin 250 mg daily - Started Breztri  daily - PRN DuoNebs q4h  HTN - Resumed home amlodipine  10 mg daily, Coreg  12.5 mg BID, Losartan  100 mg daily, aldactone  25 mg daily  HLD - Resumed home lipitor 40 mg daily  Rheumatoid arthritis Psoriatic arthritis - Resumed home leflunomide  20 mg daily  Seizure disorder - Resumed home Vimpat  100 mg BID  CVA - Resumed home aspirin  81 mg daily  DVT prophylaxis: enoxaparin  (LOVENOX ) injection 40 mg Start: 07/30/24 2200   Code Status:   Code Status: Full Code  Family Communication: Discussed with patient's son Tim over the phone  Disposition Plan: Home pending clinical improvement PT -   OT -    DME Needs:       Level of care: Telemetry  Consultants:  None  Procedures:  None  Antimicrobials: Azithromycin    Subjective: Patient examined at bedside. Very hard of hearing. Reports feeling better this morning, still having coughing fits but denies shortness of breath. Has some lateral chest wall pain from coughing. No nausea/vomiting, abdominal pain, fevers, chills.   Objective: Vitals:   07/30/24 2143 07/30/24 2339 07/31/24 0341 07/31/24 0500  BP: (!) 167/84 (!) 157/76 128/68   Pulse: 81 76 64   Resp: 18 20 20    Temp: 98.7 F (37.1 C) 98.3 F (36.8  C) 97.8 F (36.6 C)   TempSrc:      SpO2: 98% 96% 98%   Weight:    85.4 kg  Height:        Intake/Output Summary (Last 24 hours) at 07/31/2024 0753 Last data filed at 07/31/2024 0600 Gross per 24 hour  Intake 240 ml  Output 250 ml  Net -10 ml   Filed Weights   07/30/24 1615 07/31/24 0500  Weight: 84.5 kg 85.4 kg    Examination:  Gen: NAD,  A&Ox3, very hard of hearing HEENT: NCAT, EOMI Neck: Supple, no JVD CV: RRR, no murmurs Resp: normal WOB, largely clear to auscultation, faint right-sided mid lobe wheezing noted briefly but clears after a few breaths Abd: Soft, NTND, no guarding Ext: trace LE edema at the shins, pulses 2+ b/l Skin: Warm, dry, Neuro: No focal deficits Psych: Calm, cooperative, appropriate affect   Data Reviewed: I have personally reviewed following labs and imaging studies  CBC: Recent Labs  Lab 07/27/24 1005 07/30/24 1709 07/31/24 0616  WBC 6.4 11.1* 10.2  HGB 12.9 13.4 12.7  HCT 38.0 40.1 37.3  MCV 85.6 87.2 85.9  PLT 185 213 195   Basic Metabolic Panel: Recent Labs  Lab 07/27/24 1005 07/30/24 1631 07/30/24 1756 07/31/24 0616  NA 141 140  --  140  K 3.8 4.1  --  4.0  CL 106 106  --  109  CO2 22 23  --  20*  GLUCOSE 96 122*  --  121*  BUN 15 31*  --  27*  CREATININE 0.77 0.92  --  0.75  CALCIUM  10.5* 9.2  --  8.3*  MG  --  2.2 2.2  --    GFR: Estimated Creatinine Clearance: 76.2 mL/min (by C-G formula based on SCr of 0.75 mg/dL). Liver Function Tests: No results for input(s): AST, ALT, ALKPHOS, BILITOT, PROT, ALBUMIN in the last 168 hours. No results for input(s): LIPASE, AMYLASE in the last 168 hours. No results for input(s): AMMONIA in the last 168 hours. Coagulation Profile: No results for input(s): INR, PROTIME in the last 168 hours. Cardiac Enzymes: No results for input(s): CKTOTAL, CKMB, CKMBINDEX, TROPONINI in the last 168 hours. BNP (last 3 results) Recent Labs    07/30/24 1631  PROBNP 519.0*   HbA1C: No results for input(s): HGBA1C in the last 72 hours. CBG: No results for input(s): GLUCAP in the last 168 hours. Lipid Profile: No results for input(s): CHOL, HDL, LDLCALC, TRIG, CHOLHDL, LDLDIRECT in the last 72 hours. Thyroid  Function Tests: No results for input(s): TSH, T4TOTAL, FREET4, T3FREE, THYROIDAB  in the last 72 hours. Anemia Panel: No results for input(s): VITAMINB12, FOLATE, FERRITIN, TIBC, IRON, RETICCTPCT in the last 72 hours. Sepsis Labs: No results for input(s): PROCALCITON, LATICACIDVEN in the last 168 hours.  Recent Results (from the past 240 hours)  Resp panel by RT-PCR (RSV, Flu A&B, Covid) Anterior Nasal Swab     Status: None   Collection Time: 07/30/24  5:09 PM   Specimen: Anterior Nasal Swab  Result Value Ref Range Status   SARS Coronavirus 2 by RT PCR NEGATIVE NEGATIVE Final    Comment: (NOTE) SARS-CoV-2 target nucleic acids are NOT DETECTED.  The SARS-CoV-2 RNA is generally detectable in upper respiratory specimens during the acute phase of infection. The lowest concentration of SARS-CoV-2 viral copies this assay can detect is 138 copies/mL. A negative result does not preclude SARS-Cov-2 infection and should not be used as the sole basis for treatment or other  patient management decisions. A negative result may occur with  improper specimen collection/handling, submission of specimen other than nasopharyngeal swab, presence of viral mutation(s) within the areas targeted by this assay, and inadequate number of viral copies(<138 copies/mL). A negative result must be combined with clinical observations, patient history, and epidemiological information. The expected result is Negative.  Fact Sheet for Patients:  bloggercourse.com  Fact Sheet for Healthcare Providers:  seriousbroker.it  This test is no t yet approved or cleared by the [place]  FDA and  has been authorized for detection and/or diagnosis of SARS-CoV-2 by FDA under an Emergency Use Authorization (EUA). This EUA will remain  in effect (meaning this test can be used) for the duration of the COVID-19 declaration under Section 564(b)(1) of the Act, 21 U.S.C.section 360bbb-3(b)(1), unless the authorization is terminated  or revoked  sooner.       Influenza A by PCR NEGATIVE NEGATIVE Final   Influenza B by PCR NEGATIVE NEGATIVE Final    Comment: (NOTE) The Xpert Xpress SARS-CoV-2/FLU/RSV plus assay is intended as an aid in the diagnosis of influenza from Nasopharyngeal swab specimens and should not be used as a sole basis for treatment. Nasal washings and aspirates are unacceptable for Xpert Xpress SARS-CoV-2/FLU/RSV testing.  Fact Sheet for Patients: bloggercourse.com  Fact Sheet for Healthcare Providers: seriousbroker.it  This test is not yet approved or cleared by the [place]  FDA and has been authorized for detection and/or diagnosis of SARS-CoV-2 by FDA under an Emergency Use Authorization (EUA). This EUA will remain in effect (meaning this test can be used) for the duration of the COVID-19 declaration under Section 564(b)(1) of the Act, 21 U.S.C. section 360bbb-3(b)(1), unless the authorization is terminated or revoked.     Resp Syncytial Virus by PCR NEGATIVE NEGATIVE Final    Comment: (NOTE) Fact Sheet for Patients: bloggercourse.com  Fact Sheet for Healthcare Providers: seriousbroker.it  This test is not yet approved or cleared by the [place]  FDA and has been authorized for detection and/or diagnosis of SARS-CoV-2 by FDA under an Emergency Use Authorization (EUA). This EUA will remain in effect (meaning this test can be used) for the duration of the COVID-19 declaration under Section 564(b)(1) of the Act, 21 U.S.C. section 360bbb-3(b)(1), unless the authorization is terminated or revoked.  Performed at [facility], [address], [place], [place] [address]      Radiology Studies: DG Chest 2 View Result Date: 07/30/2024 CLINICAL DATA:  Short of breath, chest pain EXAM: CHEST - 2 VIEW COMPARISON:  07/27/2024 FINDINGS: The heart size and mediastinal contours are within  normal limits. Both lungs are clear. The visualized skeletal structures are unremarkable. IMPRESSION: No active cardiopulmonary disease. Electronically Signed   By: Ozell Daring M.D.   On: 07/30/2024 16:45    Scheduled Meds:  azithromycin   250 mg Oral Daily   enoxaparin  (LOVENOX ) injection  40 mg Subcutaneous Q24H   predniSONE   40 mg Oral Q breakfast   sodium chloride  flush  3 mL Intravenous Q12H   Continuous Infusions:   Unresulted Labs (From admission, onward)     Start     Ordered   07/30/24 1838  HIV Antibody (routine testing w rflx)  (HIV Antibody (Routine testing w reflex) panel)  Once,   R        07/30/24 1839             LOS:  LOS: 0 days   Time Spent: 45 minutes  Mansour Balboa Al-Sultani, MD [facility]  If 7PM-7AM, please contact night-coverage  07/31/2024, 7:53 AM

## 2024-07-31 NOTE — Evaluation (Signed)
 Physical Therapy Evaluation Patient Details Name: Susan Gilmore MRN: [id number] DOB: 1966-08-07 Today's Date: 07/31/2024  History of Present Illness  Susan Gilmore is a 58 y.o. year old female with medical history of hypertension, hyperlipidemia, CHF, rheumatoid arthritis, COPD, OSA on CPAP presenting to the ED after having 1 week of worsening shortness of breath and cough.  Clinical Impression  Patient noted to be in sitting position at PT arrival in room, for an initial PT evaluation due to a decline in functional status, with baseline mobility reported as independent, and currently requiring modI/supervsion for safety. The patient is A&O x 4, presenting with good willingness to work with PT and goals of going home. The patient resides in a mobile home and lives son who is present in room with family/friend support. There are no stairs to enter residence with ramped entrance.  Vitals are stable with an SpO? of >93% on RA after and during ambulation bout of 300' feet. Therapist, nutritional observations noted WFL. The overall clinical impression is that the patient presents with no mobility limitations. Recommended skilled PT will address safety, mobility, and discharge planning. PT recommendation to d/c patient to home upon medical clearance.        If plan is discharge home, recommend the following:     Can travel by private vehicle        Equipment Recommendations    Recommendations for Other Services       Functional Status Assessment       Precautions / Restrictions Restrictions Weight Bearing Restrictions Per Provider Order: No      Mobility  Bed Mobility Overal bed mobility: Modified Independent                  Transfers Overall transfer level: Modified independent                      Ambulation/Gait Ambulation/Gait assistance: Modified independent (Device/Increase time), Supervision Gait Distance (Feet): 300 Feet Assistive device: 1 person hand held  assist Gait Pattern/deviations: WFL(Within Functional Limits)          Stairs            Wheelchair Mobility     Tilt Bed    Modified Rankin (Stroke Patients Only)       Balance Overall balance assessment: Independent                               Standardized Balance Assessment Standardized Balance Assessment : Dynamic Gait Index   Dynamic Gait Index Level Surface: Normal Change in Gait Speed: Normal Gait with Horizontal Head Turns: Mild Impairment Gait with Vertical Head Turns: Mild Impairment Gait and Pivot Turn: Mild Impairment Step Over Obstacle: Mild Impairment Step Around Obstacles: Normal Steps: Normal Total Score: 20       Pertinent Vitals/Pain      Home Living Family/patient expects to be discharged to:: Private residence Living Arrangements: Children Available Help at Discharge: Family;Available PRN/intermittently Type of Home: Mobile home Home Access: Ramped entrance       Home Layout: One level Home Equipment: Shower Counsellor (2 wheels);BSC/3in1;Wheelchair - manual Additional Comments: son in room    Prior Function Prior Level of Function : Independent/Modified Independent             Mobility Comments: independent ADLs Comments: modified independent     Extremity/Trunk Assessment   Upper Extremity Assessment Upper Extremity Assessment: Overall  WFL for tasks assessed    Lower Extremity Assessment Lower Extremity Assessment: Overall WFL for tasks assessed    Cervical / Trunk Assessment Cervical / Trunk Assessment: Kyphotic  Communication   Communication Communication: No apparent difficulties Factors Affecting Communication: Other (comment);Hearing impaired (difficultly with langauge processing)    Cognition Arousal: Alert Behavior During Therapy: WFL for tasks assessed/performed   PT - Cognitive impairments: No apparent impairments                         Following commands:  Intact       Cueing Cueing Techniques: Verbal cues     General Comments      Exercises     Assessment/Plan    PT Assessment Patient does not need any further PT services  PT Problem List         PT Treatment Interventions      PT Goals (Current goals can be found in the Care Plan section)  Acute Rehab PT Goals Patient Stated Goal: Pt wants to go home PT Goal Formulation: With patient/family Time For Goal Achievement: 08/14/24 Potential to Achieve Goals: Good    Frequency       Co-evaluation               AM-PAC PT 6 Clicks Mobility  Outcome Measure Help needed turning from your back to your side while in a flat bed without using bedrails?: None Help needed moving from lying on your back to sitting on the side of a flat bed without using bedrails?: None Help needed moving to and from a bed to a chair (including a wheelchair)?: None Help needed standing up from a chair using your arms (e.g., wheelchair or bedside chair)?: None Help needed to walk in hospital room?: None Help needed climbing 3-5 steps with a railing? : None 6 Click Score: 24    End of Session Equipment Utilized During Treatment: Gait belt Activity Tolerance: Patient tolerated treatment well Patient left: in chair;with family/visitor present;with call bell/phone within reach Nurse Communication: Mobility status PT Visit Diagnosis: Difficulty in walking, not elsewhere classified (R26.2)    Time: 1020-1035 PT Time Calculation (min) (ACUTE ONLY): 15 min   Charges:   PT Evaluation $PT Eval Low Complexity: 1 Low   PT General Charges $$ ACUTE PT VISIT: 1 Visit         Sherlean Lesches DPT, PT    Jamarie [place]oplin A Netha Dafoe 07/31/2024, 10:42 AM

## 2024-07-31 NOTE — Plan of Care (Signed)

## 2024-07-31 NOTE — Plan of Care (Signed)
  Problem: Education: Goal: Knowledge of General Education information will improve Description Including pain rating scale, medication(s)/side effects and non-pharmacologic comfort measures Outcome: Progressing   Problem: Health Behavior/Discharge Planning: Goal: Ability to manage health-related needs will improve Outcome: Progressing   Problem: Clinical Measurements: Goal: Ability to maintain clinical measurements within normal limits will improve Outcome: Progressing   Problem: Clinical Measurements: Goal: Will remain free from infection Outcome: Progressing   Problem: Clinical Measurements: Goal: Respiratory complications will improve Outcome: Progressing   Problem: Activity: Goal: Risk for activity intolerance will decrease Outcome: Progressing   

## 2024-08-01 DIAGNOSIS — J9601 Acute respiratory failure with hypoxia: Secondary | ICD-10-CM | POA: Diagnosis not present

## 2024-08-01 LAB — BASIC METABOLIC PANEL WITH GFR
Anion gap: 10 (ref 5–15)
BUN: 24 mg/dL — ABNORMAL HIGH (ref 6–20)
CO2: 20 mmol/L — ABNORMAL LOW (ref 22–32)
Calcium: 8.2 mg/dL — ABNORMAL LOW (ref 8.9–10.3)
Chloride: 112 mmol/L — ABNORMAL HIGH (ref 98–111)
Creatinine, Ser: 0.75 mg/dL (ref 0.44–1.00)
GFR, Estimated: >60 mL/min (ref >60)
Glucose, Bld: 83 mg/dL (ref 70–99)
Potassium: 3.7 mmol/L (ref 3.5–5.1)
Sodium: 142 mmol/L (ref 135–145)

## 2024-08-01 LAB — CBC
HCT: 37.6 % (ref 36.0–46.0)
Hemoglobin: 12.6 g/dL (ref 12.0–15.0)
MCH: 29.5 pg (ref 26.0–34.0)
MCHC: 33.5 g/dL (ref 30.0–36.0)
MCV: 88.1 fL (ref 80.0–100.0)
Platelets: 189 K/uL (ref 150–400)
RBC: 4.27 MIL/uL (ref 3.87–5.11)
RDW: 14.6 % (ref 11.5–15.5)
WBC: 8.6 K/uL (ref 4.0–10.5)
nRBC: 0 % (ref 0.0–0.2)

## 2024-08-01 LAB — GLUCOSE, CAPILLARY: Glucose-Capillary: 82 mg/dL (ref 70–99)

## 2024-08-01 MED ORDER — PREDNISONE 20 MG PO TABS: 40.0000 mg | ORAL_TABLET | Freq: Every day | ORAL | 0 refills | Status: AC

## 2024-08-01 NOTE — Discharge Instructions (Signed)
-   Continue azithromycin  from the prescription you have at home for 3 more days - Continue prednisone  for the prescription provided at discharge for 3 more days - Make sure to adhere to taking Breztri  daily - Follow-up with your PCP in 1 week - A referral has been provided for you to the pulmonology clinic. Call them to make an appointment if you do not hear from them in a few days.

## 2024-08-01 NOTE — Progress Notes (Incomplete)
 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 She has reportedly been out of Breztri  for several months and more recently out of albuterol . Had several days of worsening nonproductive cough, worsening dyspnea, and wheezing. Used home DuoNebs x 40 times a day without improvement. She presented to the ED on 07/27/2024 where she was treated with DuoNebs, IV Solumedrol, IV Mg, and discharged with a prednisone  taper, but symptoms worsened again shortly after. She was seen by her PCP on 07/28/2024 and prescribed Zithromax , Prednisone , and Breztri  refill, which she reportedly took but did not have significant improvement in symptoms, which prompted her presentation to the ED again on 07/30/2024.   In the ED, she was afebrile with a temp of 98.3 F, HR 75, tachypneic to 23, SpO2 96% on 3L [place]. VBG showed pH 7.45, pCO2 32, bicarb 22. CBC showed mild leukocytosis to 11.1, otherwise unremarkable. BMP was unremarkable. ProBNP was mildly elevated to 519. hsTroponins < 15 x 2. 4-plex PCR negative. Mg 2.2. CXR showed no acute cardiopulmonary disease.   She was treated with IV Solumedrol 125 mg and DuoNebs x 2, with improvement in symptoms. She was admitted for COPD exacerbation.   Assessment and Plan:  Acute COPD exacerbation - Presented with several days of worsening cough, increased dyspnea, and tachypnea, not responsive to home albuterol  and DuoNebs, and without significant  improvement on 2-3 days of outpatient Azithromycin  and Prednisone .  - CXR unremarkable - 4-plex PCR unremarkable - Received IV Solumedrol in the ED - Maintaining SpO2 of 93-95% today - Goal SpO2 88-92% while on O2 supplementation - Continue Prednisone  40 mg daily - Continue PO Azrithromycin 250 mg daily - Continue Breztri  daily - PRN DuoNebs q4h  HTN - Resumed home amlodipine  10 mg daily, Coreg  12.5 mg BID, Losartan  100 mg daily, aldactone  25 mg daily  HLD - Resumed home lipitor 40 mg daily  Rheumatoid arthritis Psoriatic arthritis - Resumed home leflunomide  20 mg daily  Seizure disorder - Resumed home Vimpat  100 mg BID  CVA - Resumed home aspirin  81 mg daily  DVT prophylaxis: enoxaparin  (LOVENOX ) injection 40 mg Start: 07/30/24 2200   Code Status:   Code Status: Full Code  Family Communication: Discussed with patient's son Tim over the phone  Disposition Plan: Home pending clinical improvement PT -   OT -    DME Needs:       Level of care: Telemetry  Consultants:  None  Procedures:  None  Antimicrobials: Azithromycin    Subjective: Patient examined at bedside. Very hard of hearing. Reports feeling better this morning, still having coughing fits but denies shortness of breath. Has some lateral chest wall pain from coughing. No nausea/vomiting, abdominal pain, fevers, chills. ***  Objective: Vitals:   08/01/24 0633 08/01/24 0839 08/01/24 0900 08/01/24 0903  BP: 134/71 135/80 135/80 135/80  Pulse: 66 64  64  Resp:  16    Temp:  98.4 F (36.9 C)    TempSrc:      SpO2:  94%  Weight:      Height:        Intake/Output Summary (Last 24 hours) at 08/01/2024 0932 Last data filed at 08/01/2024 9094 Gross per 24 hour  Intake 250 ml  Output --  Net 250 ml   Filed Weights   07/30/24 1615 07/31/24 0500 08/01/24 0500  Weight: 84.5 kg 85.4 kg 87.2 kg    Examination:  Gen: NAD, A&Ox3, very hard of hearing HEENT: NCAT, EOMI Neck: Supple, no JVD CV:  RRR, no murmurs Resp: normal WOB, largely clear to auscultation, faint right-sided mid lobe wheezing noted briefly but clears after a few breaths Abd: Soft, NTND, no guarding Ext: trace LE edema at the shins, pulses 2+ b/l Skin: Warm, dry, Neuro: No focal deficits Psych: Calm, cooperative, appropriate affect   Data Reviewed: I have personally reviewed following labs and imaging studies  CBC: Recent Labs  Lab 07/27/24 1005 07/30/24 1709 07/31/24 0616 08/01/24 0807  WBC 6.4 11.1* 10.2 8.6  HGB 12.9 13.4 12.7 12.6  HCT 38.0 40.1 37.3 37.6  MCV 85.6 87.2 85.9 88.1  PLT 185 213 195 189   Basic Metabolic Panel: Recent Labs  Lab 07/27/24 1005 07/30/24 1631 07/30/24 1756 07/31/24 0616 08/01/24 0807  NA 141 140  --  140 142  K 3.8 4.1  --  4.0 3.7  CL 106 106  --  109 112*  CO2 22 23  --  20* 20*  GLUCOSE 96 122*  --  121* 83  BUN 15 31*  --  27* 24*  CREATININE 0.77 0.92  --  0.75 0.75  CALCIUM  10.5* 9.2  --  8.3* 8.2*  MG  --  2.2 2.2  --   --    GFR: Estimated Creatinine Clearance: 77.1 mL/min (by C-G formula based on SCr of 0.75 mg/dL). Liver Function Tests: No results for input(s): AST, ALT, ALKPHOS, BILITOT, PROT, ALBUMIN in the last 168 hours. No results for input(s): LIPASE, AMYLASE in the last 168 hours. No results for input(s): AMMONIA in the last 168 hours. Coagulation Profile: No results for input(s): INR, PROTIME in the last 168 hours. Cardiac Enzymes: No results for input(s): CKTOTAL, CKMB, CKMBINDEX, TROPONINI in the last 168 hours. BNP (last 3 results) Recent Labs    07/30/24 1631  PROBNP 519.0*   HbA1C: No results for input(s): HGBA1C in the last 72 hours. CBG: Recent Labs  Lab 07/31/24 0911 08/01/24 0840  GLUCAP 121* 82   Lipid Profile: No results for input(s): CHOL, HDL, LDLCALC, TRIG, CHOLHDL, LDLDIRECT in the last 72 hours. Thyroid  Function Tests: No results for input(s): TSH, T4TOTAL,  FREET4, T3FREE, THYROIDAB in the last 72 hours. Anemia Panel: No results for input(s): VITAMINB12, FOLATE, FERRITIN, TIBC, IRON, RETICCTPCT in the last 72 hours. Sepsis Labs: No results for input(s): PROCALCITON, LATICACIDVEN in the last 168 hours.  Recent Results (from the past 240 hours)  Resp panel by RT-PCR (RSV, Flu A&B, Covid) Anterior Nasal Swab     Status: None   Collection Time: 07/30/24  5:09 PM   Specimen: Anterior Nasal Swab  Result Value Ref Range Status   SARS Coronavirus 2 by RT PCR NEGATIVE NEGATIVE Final    Comment: (NOTE) SARS-CoV-2 target nucleic acids are NOT DETECTED.  The SARS-CoV-2 RNA is generally detectable in upper respiratory specimens during the acute phase of infection. The lowest concentration of SARS-CoV-2 viral copies this assay can detect is 138 copies/mL. A negative result does not preclude SARS-Cov-2 infection and should not be used as  the sole basis for treatment or other patient management decisions. A negative result may occur with  improper specimen collection/handling, submission of specimen other than nasopharyngeal swab, presence of viral mutation(s) within the areas targeted by this assay, and inadequate number of viral copies(<138 copies/mL). A negative result must be combined with clinical observations, patient history, and epidemiological information. The expected result is Negative.  Fact Sheet for Patients:  bloggercourse.com  Fact Sheet for Healthcare Providers:  seriousbroker.it  This test is no t yet approved or cleared by the [place]  FDA and  has been authorized for detection and/or diagnosis of SARS-CoV-2 by FDA under an Emergency Use Authorization (EUA). This EUA will remain  in effect (meaning this test can be used) for the duration of the COVID-19 declaration under Section 564(b)(1) of the Act, 21 U.S.C.section 360bbb-3(b)(1), unless the  authorization is terminated  or revoked sooner.       Influenza A by PCR NEGATIVE NEGATIVE Final   Influenza B by PCR NEGATIVE NEGATIVE Final    Comment: (NOTE) The Xpert Xpress SARS-CoV-2/FLU/RSV plus assay is intended as an aid in the diagnosis of influenza from Nasopharyngeal swab specimens and should not be used as a sole basis for treatment. Nasal washings and aspirates are unacceptable for Xpert Xpress SARS-CoV-2/FLU/RSV testing.  Fact Sheet for Patients: bloggercourse.com  Fact Sheet for Healthcare Providers: seriousbroker.it  This test is not yet approved or cleared by the [place]  FDA and has been authorized for detection and/or diagnosis of SARS-CoV-2 by FDA under an Emergency Use Authorization (EUA). This EUA will remain in effect (meaning this test can be used) for the duration of the COVID-19 declaration under Section 564(b)(1) of the Act, 21 U.S.C. section 360bbb-3(b)(1), unless the authorization is terminated or revoked.     Resp Syncytial Virus by PCR NEGATIVE NEGATIVE Final    Comment: (NOTE) Fact Sheet for Patients: bloggercourse.com  Fact Sheet for Healthcare Providers: seriousbroker.it  This test is not yet approved or cleared by the [place]  FDA and has been authorized for detection and/or diagnosis of SARS-CoV-2 by FDA under an Emergency Use Authorization (EUA). This EUA will remain in effect (meaning this test can be used) for the duration of the COVID-19 declaration under Section 564(b)(1) of the Act, 21 U.S.C. section 360bbb-3(b)(1), unless the authorization is terminated or revoked.  Performed at [facility], [address], [place], [place] [address]      Radiology Studies: DG Chest 2 View Result Date: 07/30/2024 CLINICAL DATA:  Short of breath, chest pain EXAM: CHEST - 2 VIEW COMPARISON:  07/27/2024 FINDINGS: The heart  size and mediastinal contours are within normal limits. Both lungs are clear. The visualized skeletal structures are unremarkable. IMPRESSION: No active cardiopulmonary disease. Electronically Signed   By: Ozell Daring M.D.   On: 07/30/2024 16:45    Scheduled Meds:  amLODipine   10 mg Oral Daily   aspirin  EC  81 mg Oral Daily   atorvastatin   40 mg Oral Daily   azithromycin   250 mg Oral Daily   budesonide -glycopyrrolate -formoterol   2 puff Inhalation BID   carvedilol   12.5 mg Oral BID WC   enoxaparin  (LOVENOX ) injection  40 mg Subcutaneous Q24H   lacosamide   100 mg Oral Q12H   leflunomide   20 mg Oral Daily   losartan   100 mg Oral Daily   predniSONE   40 mg Oral Q breakfast   sodium chloride  flush  3 mL Intravenous Q12H   spironolactone   25 mg Oral Daily  Continuous Infusions:   Unresulted Labs (From admission, onward)     Start     Ordered   08/02/24 0500  CBC  Daily,   R      08/01/24 0042   08/02/24 0500  Basic metabolic panel with GFR  Daily,   R      08/01/24 0042             LOS:  LOS: 0 days   Time Spent: 45 minutes  Kailyn Vanderslice Al-Sultani, MD [facility]  If 7PM-7AM, please contact night-coverage  08/01/2024, 9:32 AM

## 2024-08-01 NOTE — Plan of Care (Signed)

## 2024-08-01 NOTE — Discharge Summary (Signed)
 Physician Discharge Summary   Patient: Susan Gilmore MRN: [id number] DOB: 30-Apr-1966  Admit date:     07/30/2024  Discharge date: 08/01/24  Discharge Physician: Duffy Al-Sultani   PCP: Donzella Lauraine SAILOR, DO   Recommendations at discharge:  {Tip this will not be part of the note when signed- Example include specific recommendations for outpatient follow-up, pending tests to follow-up on. (Optional):26781} {Discharge Recommendations:34150}  Discharge Diagnoses: Principal Problem:   Acute hypoxic respiratory failure (HCC) Active Problems:   Primary hypertension   OSA on CPAP   Chronic obstructive pulmonary disease (HCC)   Rheumatoid arthritis involving multiple sites (HCC)   History of stroke   HFrEF (heart failure with reduced ejection fraction) (HCC)  Resolved Problems:   * No resolved hospital problems. [facility] Course: No notes on file  Assessment and Plan: No notes have been filed under this hospital service. Service: Hospitalist      {Tip this will not be part of the note when signed Body mass index is 37.54 kg/m. , ,  (Optional):26781}   Consultants: *** Procedures performed: ***  Disposition: {Plan; Disposition:26390} Diet recommendation:  Diet Orders (From admission, onward)     Start     Ordered   07/30/24 1838  Diet Heart Room service appropriate? Yes; Fluid consistency: Thin  Diet effective now       Question Answer Comment  Room service appropriate? Yes   Fluid consistency: Thin      07/30/24 1839            DISCHARGE MEDICATION: Allergies as of 08/01/2024       Reactions   Venlafaxine Swelling   Effexor [venlafaxine Hcl] Swelling   Isosorbide Nitrate Other (See Comments)   Reaction: Bad headache        Medication List     TAKE these medications    albuterol  108 (90 Base) MCG/ACT inhaler Commonly known as: VENTOLIN  HFA Inhale 2 puffs into the lungs every 6 (six) hours as needed for wheezing or shortness of breath.    amLODipine  10 MG tablet Commonly known as: NORVASC  Take 1 tablet (10 mg total) by mouth daily.   aspirin  EC 81 MG tablet Take 1 tablet (81 mg total) by mouth daily.   atorvastatin  40 MG tablet Commonly known as: LIPITOR TAKE 1 TABLET BY MOUTH DAILY   azithromycin  250 MG tablet Commonly known as: ZITHROMAX  Take 2 tablets on day 1, then 1 tablet daily on days 2 through 5   Breztri  Aerosphere 160-9-4.8 MCG/ACT Aero inhaler Generic drug: budesonide -glycopyrrolate -formoterol  Inhale 2 puffs into the lungs 2 (two) times daily.   carvedilol  12.5 MG tablet Commonly known as: COREG  Take 12.5 mg by mouth 2 (two) times daily.   ipratropium-albuterol  0.5-2.5 (3) MG/3ML Soln Commonly known as: DUONEB Take 3 mLs by nebulization every 6 (six) hours as needed.   Lacosamide  100 MG Tabs Take 100 mg by mouth every 12 (twelve) hours.   leflunomide  20 MG tablet Commonly known as: ARAVA  TAKE 1 TABLET(20 MG) BY MOUTH DAILY   losartan  100 MG tablet Commonly known as: COZAAR  Take 1 tablet (100 mg total) by mouth daily.   predniSONE  20 MG tablet Commonly known as: DELTASONE  Take 2 tablets (40 mg total) by mouth daily with breakfast for 3 days. Start taking on: August 02, 2024   spironolactone  25 MG tablet Commonly known as: ALDACTONE  Take 25 mg by mouth daily.   Taltz 80 MG/ML pen Generic drug: ixekizumab Inject 80 mg into the skin  every 28 (twenty-eight) days.        Follow-up Information     Pardue, Lauraine SAILOR, DO. Schedule an appointment as soon as possible for a visit in 1 week(s).   Specialty: Family Medicine Contact information: [address] [place] [address] [place] [address] [phone]                 Discharge Exam: Fredricka Weights   07/30/24 1615 07/31/24 0500 08/01/24 0500  Weight: 84.5 kg 85.4 kg 87.2 kg   Blood pressure (!) 150/81, pulse 69, temperature 98.2 F (36.8 C), resp. rate (!) 22, height 5' (1.524 m), weight 87.2 kg, SpO2 95%.    ***  Condition at discharge: {DC Condition:26389}  The results of significant diagnostics from this hospitalization (including imaging, microbiology, ancillary and laboratory) are listed below for reference.   Imaging Studies: DG Chest 2 View Result Date: 07/30/2024 CLINICAL DATA:  Short of breath, chest pain EXAM: CHEST - 2 VIEW COMPARISON:  07/27/2024 FINDINGS: The heart size and mediastinal contours are within normal limits. Both lungs are clear. The visualized skeletal structures are unremarkable. IMPRESSION: No active cardiopulmonary disease. Electronically Signed   By: Ozell Daring M.D.   On: 07/30/2024 16:45   DG Chest 2 View Result Date: 07/27/2024 EXAM: 2 VIEW(S) XRAY OF THE CHEST 07/27/2024 10:29:00 AM COMPARISON: 05/30/2024 CLINICAL HISTORY: SOB FINDINGS: LUNGS AND PLEURA: Lower lung volumes with perihilar bronchovascular prominence left greater than right. No focal pulmonary opacity. No pleural effusion. No pneumothorax. HEART AND MEDIASTINUM: No acute abnormality of the cardiac and mediastinal silhouettes. BONES AND SOFT TISSUES: Mild multilevel degenerative changes of Thoracic Spine. Left shoulder degenerative joint disease. IMPRESSION: 1. Low lung volumes.no acute disease. Electronically signed by: Dayne Hassell MD 07/27/2024 10:53 AM EST RP Workstation: [id number]    Microbiology: Results for orders placed or performed during the hospital encounter of 07/30/24  Resp panel by RT-PCR (RSV, Flu A&B, Covid) Anterior Nasal Swab     Status: None   Collection Time: 07/30/24  5:09 PM   Specimen: Anterior Nasal Swab  Result Value Ref Range Status   SARS Coronavirus 2 by RT PCR NEGATIVE NEGATIVE Final    Comment: (NOTE) SARS-CoV-2 target nucleic acids are NOT DETECTED.  The SARS-CoV-2 RNA is generally detectable in upper respiratory specimens during the acute phase of infection. The lowest concentration of SARS-CoV-2 viral copies this assay can detect is 138 copies/mL. A  negative result does not preclude SARS-Cov-2 infection and should not be used as the sole basis for treatment or other patient management decisions. A negative result may occur with  improper specimen collection/handling, submission of specimen other than nasopharyngeal swab, presence of viral mutation(s) within the areas targeted by this assay, and inadequate number of viral copies(<138 copies/mL). A negative result must be combined with clinical observations, patient history, and epidemiological information. The expected result is Negative.  Fact Sheet for Patients:  bloggercourse.com  Fact Sheet for Healthcare Providers:  seriousbroker.it  This test is no t yet approved or cleared by the [place]  FDA and  has been authorized for detection and/or diagnosis of SARS-CoV-2 by FDA under an Emergency Use Authorization (EUA). This EUA will remain  in effect (meaning this test can be used) for the duration of the COVID-19 declaration under Section 564(b)(1) of the Act, 21 U.S.C.section 360bbb-3(b)(1), unless the authorization is terminated  or revoked sooner.       Influenza A by PCR NEGATIVE NEGATIVE Final   Influenza B by PCR NEGATIVE NEGATIVE  Final    Comment: (NOTE) The Xpert Xpress SARS-CoV-2/FLU/RSV plus assay is intended as an aid in the diagnosis of influenza from Nasopharyngeal swab specimens and should not be used as a sole basis for treatment. Nasal washings and aspirates are unacceptable for Xpert Xpress SARS-CoV-2/FLU/RSV testing.  Fact Sheet for Patients: bloggercourse.com  Fact Sheet for Healthcare Providers: seriousbroker.it  This test is not yet approved or cleared by the [place]  FDA and has been authorized for detection and/or diagnosis of SARS-CoV-2 by FDA under an Emergency Use Authorization (EUA). This EUA will remain in effect (meaning this test can  be used) for the duration of the COVID-19 declaration under Section 564(b)(1) of the Act, 21 U.S.C. section 360bbb-3(b)(1), unless the authorization is terminated or revoked.     Resp Syncytial Virus by PCR NEGATIVE NEGATIVE Final    Comment: (NOTE) Fact Sheet for Patients: bloggercourse.com  Fact Sheet for Healthcare Providers: seriousbroker.it  This test is not yet approved or cleared by the [place]  FDA and has been authorized for detection and/or diagnosis of SARS-CoV-2 by FDA under an Emergency Use Authorization (EUA). This EUA will remain in effect (meaning this test can be used) for the duration of the COVID-19 declaration under Section 564(b)(1) of the Act, 21 U.S.C. section 360bbb-3(b)(1), unless the authorization is terminated or revoked.  Performed at [facility], [address] [place], [place] [address]     Labs: CBC: Recent Labs  Lab 07/27/24 1005 07/30/24 1709 07/31/24 0616 08/01/24 0807  WBC 6.4 11.1* 10.2 8.6  HGB 12.9 13.4 12.7 12.6  HCT 38.0 40.1 37.3 37.6  MCV 85.6 87.2 85.9 88.1  PLT 185 213 195 189   Basic Metabolic Panel: Recent Labs  Lab 07/27/24 1005 07/30/24 1631 07/30/24 1756 07/31/24 0616 08/01/24 0807  NA 141 140  --  140 142  K 3.8 4.1  --  4.0 3.7  CL 106 106  --  109 112*  CO2 22 23  --  20* 20*  GLUCOSE 96 122*  --  121* 83  BUN 15 31*  --  27* 24*  CREATININE 0.77 0.92  --  0.75 0.75  CALCIUM  10.5* 9.2  --  8.3* 8.2*  MG  --  2.2 2.2  --   --    Liver Function Tests: No results for input(s): AST, ALT, ALKPHOS, BILITOT, PROT, ALBUMIN in the last 168 hours. CBG: Recent Labs  Lab 07/31/24 0911 08/01/24 0840  GLUCAP 121* 82    Discharge time spent: Time Coordinating Discharge: I spent a total of 35 minutes engaged in face-to-face discussion with the patient and/or caregivers regarding the patient's care, assessment, plan, and discharge  disposition. Over 50% of this time was dedicated to counseling the patient on the risks and benefits of treatment options and the discharge plan, as well as coordinating post-discharge care.   Signed: Shalev Helminiak Al-Sultani, MD [facility] 08/01/2024

## 2024-08-02 NOTE — ED Provider Notes (Signed)
 [facility] Provider Note    Event Date/Time   First MD Initiated Contact with Patient 07/27/24 1033     (approximate)   History   Shortness of Breath   HPI  Susan Gilmore is a 58 y.o. female with a history of COPD presents with shortness of breath and wheezing.  Has been using her nebulizers with little improvement, symptoms worsening over the last couple of days.  Has PCP appointment tomorrow.  No fevers reported.  No chest pain     Physical Exam   Triage Vital Signs: ED Triage Vitals  Encounter Vitals Group     BP 07/27/24 1003 (!) 152/85     Girls Systolic BP Percentile --      Girls Diastolic BP Percentile --      Boys Systolic BP Percentile --      Boys Diastolic BP Percentile --      Pulse Rate 07/27/24 1003 85     Resp 07/27/24 1003 (!) 24     Temp 07/27/24 1003 99.1 F (37.3 C)     Temp src --      SpO2 07/27/24 1003 98 %     Weight 07/27/24 1004 81.6 kg (180 lb)     Height 07/27/24 1004 1.575 m (5' 2)     Head Circumference --      Peak Flow --      Pain Score 07/27/24 1002 0     Pain Loc --      Pain Education --      Exclude from Growth Chart --     Most recent vital signs: Vitals:   07/27/24 1003  BP: (!) 152/85  Pulse: 85  Resp: (!) 24  Temp: 99.1 F (37.3 C)  SpO2: 98%     General: Awake, anxious, pursed lip breathing CV:  Good peripheral perfusion.  Resp:  Increased respiratory effort with tachypnea, moderate airflow with scattered wheezing Abd:  No distention.  Other:  No calf pain or swelling   ED Results / Procedures / Treatments   Labs (all labs ordered are listed, but only abnormal results are displayed) Labs Reviewed  BASIC METABOLIC PANEL WITH GFR - Abnormal; Notable for the following components:      Result Value   Calcium  10.5 (*)    All other components within normal limits  CBC  TROPONIN T, HIGH SENSITIVITY     EKG  ED ECG REPORT I, Lamar Price, the attending physician,  personally viewed and interpreted this ECG.  Date: 08/02/2024  Rhythm: normal sinus rhythm QRS Axis: normal Intervals: normal ST/T Wave abnormalities: Nonspecific changes laterally     RADIOLOGY Chest x-ray viewed interpret by me, no evidence of pneumonia    PROCEDURES:  Critical Care performed: yes  CRITICAL CARE Performed by: Lamar Price   Total critical care time: 30 minutes  Critical care time was exclusive of separately billable procedures and treating other patients.  Critical care was necessary to treat or prevent imminent or life-threatening deterioration.  Critical care was time spent personally by me on the following activities: development of treatment plan with patient and/or surrogate as well as nursing, discussions with consultants, evaluation of patient's response to treatment, examination of patient, obtaining history from patient or surrogate, ordering and performing treatments and interventions, ordering and review of laboratory studies, ordering and review of radiographic studies, pulse oximetry and re-evaluation of patient's condition.   Procedures   MEDICATIONS ORDERED IN ED: Medications  ipratropium-albuterol  (DUONEB)  0.5-2.5 (3) MG/3ML nebulizer solution 3 mL (3 mLs Nebulization Given 07/27/24 1010)  ipratropium-albuterol  (DUONEB) 0.5-2.5 (3) MG/3ML nebulizer solution 3 mL (3 mLs Nebulization Given 07/27/24 1133)  methylPREDNISolone  sodium succinate (SOLU-MEDROL ) 125 mg/2 mL injection 125 mg (125 mg Intravenous Given 07/27/24 1128)  magnesium  sulfate IVPB 2 g 50 mL (0 g Intravenous Stopped 07/27/24 1240)  ipratropium-albuterol  (DUONEB) 0.5-2.5 (3) MG/3ML nebulizer solution 3 mL (3 mLs Nebulization Given 07/27/24 1240)     IMPRESSION / MDM / ASSESSMENT AND PLAN / ED COURSE  I reviewed the triage vital signs and the nursing notes. Patient's presentation is most consistent with severe exacerbation of chronic illness.  Patient presents with shortness  of breath as detailed above, she has increased respiratory effort with significant wheezing most consistent with COPD exacerbation, differential includes pneumonia, doubt pneumothorax  Will treat with IV steroids, IV magnesium , DuoNebs and carefully monitor  Lab work is overall reassuring, high sensitive troponin is normal  Patient improved after 2 DuoNebs but still with wheezing, will give an additional DuoNeb  On reeval the patient reports that she is feeling much better and is at her baseline, she would like to be discharged, I did consider admission discussed with the patient however she notes that she has PCP appointment in 1 day, and she will return if any worsening        FINAL CLINICAL IMPRESSION(S) / ED DIAGNOSES   Final diagnoses:  COPD exacerbation (HCC)     Rx / DC Orders   ED Discharge Orders          Ordered    predniSONE  (DELTASONE ) 50 MG tablet  Daily with breakfast,   Status:  Discontinued        07/27/24 1236             Note:  This document was prepared using Dragon voice recognition software and may include unintentional dictation errors.   Arlander Charleston, MD 08/02/24 [phone]

## 2024-08-04 ENCOUNTER — Encounter: Payer: Self-pay | Admitting: Pulmonary Disease

## 2024-08-04 ENCOUNTER — Ambulatory Visit: Admitting: Pulmonary Disease

## 2024-08-04 VITALS — BP 160/90 | HR 74 | Temp 98.6°F | Ht 60.0 in | Wt 193.8 lb

## 2024-08-04 DIAGNOSIS — F17210 Nicotine dependence, cigarettes, uncomplicated: Secondary | ICD-10-CM

## 2024-08-04 DIAGNOSIS — J449 Chronic obstructive pulmonary disease, unspecified: Secondary | ICD-10-CM | POA: Diagnosis not present

## 2024-08-04 DIAGNOSIS — J439 Emphysema, unspecified: Secondary | ICD-10-CM

## 2024-08-04 DIAGNOSIS — F17290 Nicotine dependence, other tobacco product, uncomplicated: Secondary | ICD-10-CM | POA: Diagnosis not present

## 2024-08-04 MED ORDER — NICOTINE 14 MG/24HR TD PT24: 14.0000 mg | MEDICATED_PATCH | Freq: Every day | TRANSDERMAL | 0 refills | Status: AC

## 2024-08-04 MED ORDER — BREZTRI AEROSPHERE 160-9-4.8 MCG/ACT IN AERO: 2.0000 | INHALATION_SPRAY | Freq: Two times a day (BID) | RESPIRATORY_TRACT | Status: AC

## 2024-08-04 MED ORDER — NICOTINE POLACRILEX 2 MG MT LOZG: 2.0000 mg | LOZENGE | OROMUCOSAL | 3 refills | Status: AC | PRN

## 2024-08-04 NOTE — Progress Notes (Signed)
 Synopsis: Referred in by Al-Sultani, Anmar, MD   Subjective:   PATIENT ID: Susan Gilmore GENDER: female DOB: 01-Apr-1966, MRN: [id number]  Chief Complaint  Patient presents with   COPD    Has been diagnosed with COPD. SOB. No wheezing. Cough.  Breztri - BID, helps her breathing. Albuterol - PRN. Duoneb- PRN    HPI Discussed the use of AI scribe software for clinical note transcription with the patient, who gave verbal consent to proceed.  History of Present Illness   Susan Gilmore is a 58 year old female with rheumatoid arthritis who presents with worsening shortness of breath.  She has experienced shortness of breath for many years, which has recently worsened. She was seen in the emergency department and started on Breztri , which has been beneficial. She experiences shortness of breath during activities such as walking or climbing stairs, occasional chest pain, and wheezing. Her cough produces 'glue-like' sputum that is difficult to expectorate.  Her past medical history includes rheumatoid arthritis, for which she receives monthly injections. She had a stroke one to two years ago, resulting in hearing difficulties.  She denies any family history of lung disease. She uses a vape but denies cigarette smoking. She has two dogs at home and previously worked in designer, fashion/clothing. She has been smoking since her teenage years, for about thirty years, and currently uses a vape but denies cigarette smoking.        Family History  Problem Relation Age of Onset   Stroke Paternal Grandmother    Heart disease Paternal Grandmother    Diabetes Maternal Grandmother    Heart disease Maternal Grandmother    Hypertension Maternal Grandmother    Heart disease Maternal Grandfather    Hypertension Maternal Grandfather    Heart disease Father    Stroke Father    Hypertension Father    Hyperlipidemia Father    Diabetes Mother    Heart attack Mother      Social History   Socioeconomic History    Marital status: Married    Spouse name: Not on file   Number of children: 1   Years of education: Not on file   Highest education level: 10th grade  Occupational History   Occupation: disability  Tobacco Use   Smoking status: Former    Current packs/day: 0.00    Average packs/day: 1 pack/day for 26.0 years (26.0 ttl pk-yrs)    Types: Cigarettes    Start date: 07/27/1987    Quit date: 07/26/2013    Years since quitting: 11.0   Smokeless tobacco: Never  Vaping Use   Vaping status: Every Day   Start date: 08/26/2018   Substances: Nicotine   Devices: 5% nicotine  Substance and Sexual Activity   Alcohol use: No    Alcohol/week: 0.0 standard drinks of alcohol    Comment: Had a history of alcohol use but currently is not drinking any   Drug use: No   Sexual activity: Not Currently    Birth control/protection: None  Other Topics Concern   Not on file  Social History Narrative   The patient is married. She has one stepson. She does not work outside the home.    Social Drivers of Corporate Investment Banker Strain: Low Risk  (05/23/2023)   Received from [facility]   Overall Financial Resource Strain (CARDIA)    Difficulty of Paying Living Expenses: Not hard at all  Food Insecurity: No Food Insecurity (07/30/2024)   Hunger Vital Sign  Worried About Programme Researcher, Broadcasting/film/video in the Last Year: Never true    Ran Out of Food in the Last Year: Never true  Transportation Needs: No Transportation Needs (07/30/2024)   PRAPARE - Administrator, Civil Service (Medical): No    Lack of Transportation (Non-Medical): No  Physical Activity: Inactive (08/29/2020)   Exercise Vital Sign    Days of Exercise per Week: 0 days    Minutes of Exercise per Session: 0 min  Stress: Stress Concern Present (08/29/2020)   Susan Gilmore    Feeling of Stress : Rather much  Social Connections: Moderately Isolated (08/29/2020)    Social Connection and Isolation Panel    Frequency of Communication with Friends and Family: More than three times a week    Frequency of Social Gatherings with Friends and Family: More than three times a week    Attends Religious Services: Never    Database Administrator or Organizations: No    Attends Banker Meetings: Never    Marital Status: Married  Catering Manager Violence: Not At Risk (07/31/2024)   Humiliation, Afraid, Rape, and Kick Gilmore    Fear of Current or Ex-Partner: No    Emotionally Abused: No    Physically Abused: No    Sexually Abused: No        Objective:   Vitals:   08/04/24 1401  BP: (!) 168/96  Pulse: 74  Temp: 98.6 F (37 C)  SpO2: 98%  Weight: 193 lb 12.8 oz (87.9 kg)  Height: 5' (1.524 m)   98% on RA BMI Readings from Last 3 Encounters:  08/04/24 37.85 kg/m  08/01/24 37.54 kg/m  07/28/24 36.36 kg/m   Wt Readings from Last 3 Encounters:  08/04/24 193 lb 12.8 oz (87.9 kg)  08/01/24 192 lb 3.9 oz (87.2 kg)  07/28/24 186 lb 3.2 oz (84.5 kg)    Physical Exam GEN: NAD, Healthy Appearing HEENT: Supple Neck, Reactive Pupils, EOMI  CVS: Normal S1, Normal S2, RRR, No murmurs or ES appreciated  Lungs: Diffuse expiratory wheezing.  Abdomen: Soft, non tender, non distended, + BS  Extremities: Warm and well perfused, No edema   Labs and imaging were reviewed.  Ancillary Information   CBC    Component Value Date/Time   WBC 8.6 08/01/2024 0807   RBC 4.27 08/01/2024 0807   HGB 12.6 08/01/2024 0807   HGB 12.5 08/04/2023 1122   HCT 37.6 08/01/2024 0807   HCT 38.7 08/04/2023 1122   PLT 189 08/01/2024 0807   PLT 241 08/04/2023 1122   MCV 88.1 08/01/2024 0807   MCV 94 08/04/2023 1122   MCV 101 (H) 05/27/2014 1050   MCH 29.5 08/01/2024 0807   MCHC 33.5 08/01/2024 0807   RDW 14.6 08/01/2024 0807   RDW 12.7 08/04/2023 1122   RDW 20.9 (H) 05/27/2014 1050   LYMPHSABS 1.1 08/04/2023 1122   LYMPHSABS 1.3 05/27/2014 1050    MONOABS 0.6 03/16/2019 0230   MONOABS 0.7 05/27/2014 1050   EOSABS 0.0 08/04/2023 1122   EOSABS 0.3 05/27/2014 1050   BASOSABS 0.1 08/04/2023 1122   BASOSABS 0.1 05/27/2014 1050        No data to display           Assessment & Plan:  Assessment and Plan    #Clinical Chronic obstructive pulmonary disease COPD with recent exacerbation, on maximum inhaler therapy. Vaping history of 30 years. Considered impact of rheumatoid arthritis on  lung function. - Ordered pulmonary function test. - Continue Breztri , two puffs twice daily. - Use albuterol  as needed. - Ordered echocardiogram. - Enrolled in lung cancer screening program with annual CT scans. - Discussed future options: Ensifentrine  nebulizer, biologic shots, pulmonary rehab.  Nicotine dependence 30-year history of vaping. Advised on potential harm and irreversible injuries from vaping. - Recommended immediate cessation of vaping. - Prescribed nicotine patch for daytime use. - Prescribed nicotine lozenges for cravings. - Advised setting a quit date.  Smoking/Tobacco Cessation Counseling Susan Gilmore is a current user of tobacco or nicotine products. She is ready to quit at this time. Counseling provided today addressed the risks of continued use and the benefits of cessation. Discussed tobacco/nicotine use history, readiness to quit, and evidence-based treatment options including behavioral strategies, support resources, and pharmacologic therapies. Provided encouragement and educational materials on steps and resources to quit smoking. Patient questions were addressed, and follow-up recommended for continued support. Total time spent on counseling: 4 minutes.        Return in about 4 months (around 12/03/2024).  I personally spent a total of 60 minutes in the care of the patient today including preparing to see the patient, getting/reviewing separately obtained history, performing a medically appropriate exam/evaluation,  counseling and educating, placing orders, documenting clinical information in the EHR, independently interpreting results, and communicating results.   Darrin Barn, MD [facility] 08/04/2024 2:24 PM

## 2024-08-04 NOTE — Addendum Note (Signed)
 Addended by: VICCI EVALENE DEL on: 08/04/2024 04:56 PM   Modules accepted: Orders

## 2024-08-11 ENCOUNTER — Telehealth: Payer: Self-pay

## 2024-08-11 NOTE — Telephone Encounter (Signed)
 Spoke with son, pt quit smoking 15-16 years ago. She will not qualify for the [facility] LDCT.

## 2024-08-12 ENCOUNTER — Ambulatory Visit (INDEPENDENT_AMBULATORY_CARE_PROVIDER_SITE_OTHER)

## 2024-08-12 DIAGNOSIS — J4489 Other specified chronic obstructive pulmonary disease: Secondary | ICD-10-CM | POA: Diagnosis not present

## 2024-08-12 DIAGNOSIS — J439 Emphysema, unspecified: Secondary | ICD-10-CM

## 2024-08-12 LAB — PULMONARY FUNCTION TEST
DL/VA % pred: 98 %
DL/VA: 4.31 ml/min/mmHg/L
DLCO cor % pred: 113 %
DLCO cor: 20.12 ml/min/mmHg
DLCO unc % pred: 110 %
DLCO unc: 19.61 ml/min/mmHg
FEF 25-75 Post: 2 L/s
FEF 25-75 Pre: 1.97 L/s
FEF2575-%Change-Post: 1 %
FEF2575-%Pred-Post: 87 %
FEF2575-%Pred-Pre: 85 %
FEV1-%Change-Post: 0 %
FEV1-%Pred-Post: 96 %
FEV1-%Pred-Pre: 95 %
FEV1-Post: 2.22 L
FEV1-Pre: 2.2 L
FEV1FVC-%Change-Post: -2 %
FEV1FVC-%Pred-Pre: 97 %
FEV6-%Change-Post: 3 %
FEV6-%Pred-Post: 103 %
FEV6-%Pred-Pre: 100 %
FEV6-Post: 2.94 L
FEV6-Pre: 2.86 L
FEV6FVC-%Change-Post: 0 %
FEV6FVC-%Pred-Post: 102 %
FEV6FVC-%Pred-Pre: 102 %
FVC-%Change-Post: 3 %
FVC-%Pred-Post: 101 %
FVC-%Pred-Pre: 97 %
FVC-Post: 2.97 L
FVC-Pre: 2.87 L
Post FEV1/FVC ratio: 75 %
Post FEV6/FVC ratio: 100 %
Pre FEV1/FVC ratio: 77 %
Pre FEV6/FVC Ratio: 99 %

## 2024-08-12 NOTE — Progress Notes (Signed)
 Full PFT performed today.

## 2024-08-12 NOTE — Patient Instructions (Signed)
 Full PFT performed today.

## 2024-08-27 ENCOUNTER — Other Ambulatory Visit: Payer: Self-pay | Admitting: Physician Assistant

## 2024-08-27 DIAGNOSIS — J449 Chronic obstructive pulmonary disease, unspecified: Secondary | ICD-10-CM

## 2024-09-09 ENCOUNTER — Ambulatory Visit
Admission: RE | Admit: 2024-09-09 | Discharge: 2024-09-09 | Disposition: A | Discharge status: Home / Self Care | Source: Ambulatory Visit | Attending: Pulmonary Disease | Admitting: Pulmonary Disease

## 2024-09-09 DIAGNOSIS — J4489 Other specified chronic obstructive pulmonary disease: Secondary | ICD-10-CM | POA: Diagnosis present

## 2024-09-09 DIAGNOSIS — J439 Emphysema, unspecified: Secondary | ICD-10-CM | POA: Insufficient documentation

## 2024-09-09 DIAGNOSIS — I080 Rheumatic disorders of both mitral and aortic valves: Secondary | ICD-10-CM | POA: Diagnosis not present

## 2024-09-09 DIAGNOSIS — R0602 Shortness of breath: Secondary | ICD-10-CM

## 2024-09-09 LAB — ECHOCARDIOGRAM COMPLETE
AR max vel: 1.51 cm2
AV Area VTI: 1.6 cm2
AV Area mean vel: 1.53 cm2
AV Mean grad: 6 mmHg
AV Peak grad: 10.9 mmHg
Ao pk vel: 1.65 m/s
Area-P 1/2: 4.06 cm2
MV M vel: 3.38 m/s
MV Peak grad: 45.7 mmHg
S' Lateral: 3.9 cm

## 2024-09-09 MED ORDER — PERFLUTREN LIPID MICROSPHERE
1.0000 mL | INTRAVENOUS | Status: AC | PRN
  Administered 2024-09-09: 2 mL via INTRAVENOUS

## 2024-12-15 ENCOUNTER — Ambulatory Visit: Admitting: Pulmonary Disease
